# Patient Record
Sex: Male | Born: 1950
Health system: Southern US, Community
[De-identification: ages and names within clinical notes are randomized; demographics above are authoritative.]

## PROBLEM LIST (undated history)

## (undated) DIAGNOSIS — R51 Headache: Secondary | ICD-10-CM

## (undated) DIAGNOSIS — R0600 Dyspnea, unspecified: Secondary | ICD-10-CM

## (undated) DIAGNOSIS — I6529 Occlusion and stenosis of unspecified carotid artery: Secondary | ICD-10-CM

## (undated) DIAGNOSIS — I2510 Atherosclerotic heart disease of native coronary artery without angina pectoris: Secondary | ICD-10-CM

## (undated) DIAGNOSIS — R519 Headache, unspecified: Secondary | ICD-10-CM

## (undated) DIAGNOSIS — C44311 Basal cell carcinoma of skin of nose: Secondary | ICD-10-CM

## (undated) DIAGNOSIS — I10 Essential (primary) hypertension: Secondary | ICD-10-CM

## (undated) DIAGNOSIS — J942 Hemothorax: Secondary | ICD-10-CM

## (undated) DIAGNOSIS — I2699 Other pulmonary embolism without acute cor pulmonale: Secondary | ICD-10-CM

## (undated) DIAGNOSIS — F329 Major depressive disorder, single episode, unspecified: Secondary | ICD-10-CM

## (undated) DIAGNOSIS — Z87898 Personal history of other specified conditions: Secondary | ICD-10-CM

## (undated) DIAGNOSIS — Z720 Tobacco use: Secondary | ICD-10-CM

## (undated) DIAGNOSIS — I779 Disorder of arteries and arterioles, unspecified: Secondary | ICD-10-CM

## (undated) DIAGNOSIS — I639 Cerebral infarction, unspecified: Secondary | ICD-10-CM

## (undated) DIAGNOSIS — F32A Depression, unspecified: Secondary | ICD-10-CM

## (undated) DIAGNOSIS — Z9889 Other specified postprocedural states: Secondary | ICD-10-CM

## (undated) DIAGNOSIS — J449 Chronic obstructive pulmonary disease, unspecified: Secondary | ICD-10-CM

## (undated) DIAGNOSIS — Z9289 Personal history of other medical treatment: Secondary | ICD-10-CM

## (undated) DIAGNOSIS — I82409 Acute embolism and thrombosis of unspecified deep veins of unspecified lower extremity: Secondary | ICD-10-CM

## (undated) DIAGNOSIS — E785 Hyperlipidemia, unspecified: Secondary | ICD-10-CM

## (undated) DIAGNOSIS — I519 Heart disease, unspecified: Secondary | ICD-10-CM

## (undated) HISTORY — DX: Other pulmonary embolism without acute cor pulmonale: I26.99

## (undated) HISTORY — DX: Hyperlipidemia, unspecified: E78.5

## (undated) HISTORY — DX: Headache, unspecified: R51.9

## (undated) HISTORY — DX: Disorder of arteries and arterioles, unspecified: I77.9

## (undated) HISTORY — DX: Chronic obstructive pulmonary disease, unspecified: J44.9

## (undated) HISTORY — DX: Personal history of other medical treatment: Z92.89

## (undated) HISTORY — DX: Occlusion and stenosis of unspecified carotid artery: I65.29

## (undated) HISTORY — DX: Heart disease, unspecified: I51.9

## (undated) HISTORY — DX: Tobacco use: Z72.0

## (undated) HISTORY — DX: Hemothorax: J94.2

## (undated) HISTORY — DX: Personal history of other specified conditions: Z87.898

## (undated) HISTORY — DX: Headache: R51

## (undated) HISTORY — DX: Acute embolism and thrombosis of unspecified deep veins of unspecified lower extremity: I82.409

## (undated) HISTORY — PX: CIRCUMCISION: SUR203

## (undated) HISTORY — PX: MOHS SURGERY: SUR867

---

## 2004-09-25 ENCOUNTER — Ambulatory Visit (HOSPITAL_BASED_OUTPATIENT_CLINIC_OR_DEPARTMENT_OTHER): Admission: RE | Admit: 2004-09-25 | Discharge: 2004-09-25 | Payer: Self-pay | Admitting: Urology

## 2004-09-25 ENCOUNTER — Ambulatory Visit (HOSPITAL_COMMUNITY): Admission: RE | Admit: 2004-09-25 | Discharge: 2004-09-25 | Payer: Self-pay | Admitting: Urology

## 2011-11-14 ENCOUNTER — Emergency Department (INDEPENDENT_AMBULATORY_CARE_PROVIDER_SITE_OTHER)
Admission: EM | Admit: 2011-11-14 | Discharge: 2011-11-14 | Disposition: A | Discharge status: Home / Self Care | Payer: 59 | Source: Home / Self Care | Attending: Family Medicine | Admitting: Family Medicine

## 2011-11-14 ENCOUNTER — Encounter: Payer: Self-pay | Admitting: Emergency Medicine

## 2011-11-14 DIAGNOSIS — IMO0002 Reserved for concepts with insufficient information to code with codable children: Secondary | ICD-10-CM

## 2011-11-14 DIAGNOSIS — S76312A Strain of muscle, fascia and tendon of the posterior muscle group at thigh level, left thigh, initial encounter: Secondary | ICD-10-CM

## 2011-11-14 NOTE — ED Provider Notes (Signed)
History     CSN: [id number]  Arrival date & time 11/14/11  1401   First MD Initiated Contact with Patient 11/14/11 1417      Chief Complaint  Patient presents with  . Leg Pain     HPI Comments: Patient states that about 3 days ago he did not see a short step-off and came down abruptly on his left foot.  He subsequently developed localized pain/swelling in his left posterior thigh, and has pain with walking.   Patient is a 61 y.o. male presenting with leg pain. The history is provided by the patient.  Leg Pain  Incident onset: 3 days ago. The incident occurred in the street. Pain location: left posterior thigh. The quality of the pain is described as aching. The pain is moderate. The pain has been constant since onset. Associated symptoms include muscle weakness. Pertinent negatives include no numbness, no inability to bear weight, no loss of motion, no loss of sensation and no tingling. He reports no foreign bodies present. The symptoms are aggravated by bearing weight. He has tried nothing for the symptoms.    History reviewed. No pertinent past medical history.  Past Surgical History  Procedure Date  . Circumcision     Family History  Problem Relation Age of Onset  . Heart failure Father   . Hypertension Father   . Heart failure Brother   . Hypertension Brother   . Heart failure Paternal Uncle   . Hypertension Paternal Uncle     History  Substance Use Topics  . Smoking status: Current Everyday Smoker  . Smokeless tobacco: Not on file  . Alcohol Use: No      Review of Systems  Neurological: Negative for tingling and numbness.  All other systems reviewed and are negative.    Allergies  Review of patient's allergies indicates no known allergies.  Home Medications  No current outpatient prescriptions on file.  BP 148/84  Pulse 70  Temp 98.1 F (36.7 C) (Oral)  Resp 16  Ht 5\' 11"  (1.803 m)  Wt 195 lb (88.451 kg)  BMI 27.20 kg/m2  SpO2 97%  Physical Exam   Constitutional: He is oriented to person, place, and time. He appears well-developed and well-nourished. No distress.  Eyes: Conjunctivae and EOM are normal. Pupils are equal, round, and reactive to light.  Musculoskeletal: He exhibits tenderness. He exhibits no edema.       Left upper leg: He exhibits tenderness and swelling. He exhibits no bony tenderness, no edema, no deformity and no laceration.       Legs:      There is tenderness/swelling left posterior thigh as noted on diagram.  Pain is elicited when flexing left knee.  Distal Neurovascular function is intact.                                                                                          Neurological: He is alert and oriented to person, place, and time.  Skin: Skin is warm and dry. No rash noted.    ED Course  Procedures  none      1. Left hamstring muscle strain  Note elevated blood pressure today.      MDM   Ace wrap applied. Monitor Blood Pressure.  Followup with PCP if remains elevated Wear ace wrap on left upper leg until swelling decreases.  Apply ice pack several times daily.  Take Ibuprofen 200mg , 4 tabs every 8 hours with food.  Begin range of motion and stretching exercises in 3 to 5 days [employer] information and instruction handout given)  Followup with [facility] if not improving about two weeks.          Lattie Haw, MD 11/18/11 1800

## 2011-11-14 NOTE — ED Notes (Signed)
Reports pain in back of left leg/upper thigh x 2 days. Did have podiatry procedure on bunion on left foot earlier in week and has walked guardedly since then.

## 2012-08-31 ENCOUNTER — Emergency Department (INDEPENDENT_AMBULATORY_CARE_PROVIDER_SITE_OTHER)
Admission: EM | Admit: 2012-08-31 | Discharge: 2012-08-31 | Disposition: A | Discharge status: Home / Self Care | Payer: 59 | Source: Home / Self Care | Attending: Family Medicine | Admitting: Family Medicine

## 2012-08-31 ENCOUNTER — Encounter: Payer: Self-pay | Admitting: *Deleted

## 2012-08-31 ENCOUNTER — Emergency Department (INDEPENDENT_AMBULATORY_CARE_PROVIDER_SITE_OTHER): Payer: 59

## 2012-08-31 DIAGNOSIS — R1031 Right lower quadrant pain: Secondary | ICD-10-CM

## 2012-08-31 DIAGNOSIS — J189 Pneumonia, unspecified organism: Secondary | ICD-10-CM

## 2012-08-31 DIAGNOSIS — Z716 Tobacco abuse counseling: Secondary | ICD-10-CM

## 2012-08-31 DIAGNOSIS — K449 Diaphragmatic hernia without obstruction or gangrene: Secondary | ICD-10-CM

## 2012-08-31 DIAGNOSIS — K5730 Diverticulosis of large intestine without perforation or abscess without bleeding: Secondary | ICD-10-CM

## 2012-08-31 DIAGNOSIS — M47817 Spondylosis without myelopathy or radiculopathy, lumbosacral region: Secondary | ICD-10-CM

## 2012-08-31 DIAGNOSIS — R062 Wheezing: Secondary | ICD-10-CM

## 2012-08-31 LAB — POCT CBC W AUTO DIFF (K'VILLE URGENT CARE)

## 2012-08-31 MED ORDER — CEFTRIAXONE SODIUM 1 G IJ SOLR: 1.0000 g | INTRAMUSCULAR | Status: DC

## 2012-08-31 MED ORDER — AZITHROMYCIN 250 MG PO TABS: ORAL_TABLET | ORAL | Status: DC

## 2012-08-31 MED ORDER — METHYLPREDNISOLONE ACETATE 80 MG/ML IJ SUSP
80.0000 mg | Freq: Once | INTRAMUSCULAR | Status: AC
  Administered 2012-08-31: 80 mg via INTRAMUSCULAR

## 2012-08-31 MED ORDER — DOXYCYCLINE HYCLATE 100 MG PO CAPS: 100.0000 mg | ORAL_CAPSULE | Freq: Two times a day (BID) | ORAL | Status: DC

## 2012-08-31 MED ORDER — IOHEXOL 300 MG/ML  SOLN
100.0000 mL | Freq: Once | INTRAMUSCULAR | Status: AC | PRN
  Administered 2012-08-31: 100 mL via INTRAVENOUS

## 2012-08-31 NOTE — ED Notes (Signed)
Jermal c/o productive cough, chills/sweats, body aches and HA x 5 days. He received a flu vaccine in December 2013.

## 2012-08-31 NOTE — ED Provider Notes (Addendum)
History     CSN: [id number]  Arrival date & time 08/31/12  2952   First MD Initiated Contact with Patient 08/31/12 [phone]      Chief Complaint  Patient presents with  . Generalized Body Aches  . Fever  . Cough    HPI Patient presents today with chief complaint of generalized malaise, fatigue, low-grade fever, cough. Patient states symptoms have been present for the past 4-5 days. Patient denies any known sick contacts however has been around call students with strep. Patient denies any sore throat today. Positive productive cough, no shortness of breath, faint wheezing. Patient is noted to be occurring a quarter pack per day smoker and was a prior greater than one pack per day smoker for greater than 20 years. No nausea vomiting. Patient has had some mild abdominal pain. Mild body aches. No recent hospitalizations. Had flu shot this year.   History reviewed. No pertinent past medical history.  Past Surgical History  Procedure Laterality Date  . Circumcision      Family History  Problem Relation Age of Onset  . Heart failure Father   . Hypertension Father   . Heart failure Brother   . Hypertension Brother   . Heart failure Paternal Uncle   . Hypertension Paternal Uncle     History  Substance Use Topics  . Smoking status: Current Every Day Smoker -- 0.25 packs/day    Types: Cigarettes  . Smokeless tobacco: Never Used  . Alcohol Use: No      Review of Systems  All other systems reviewed and are negative.    Allergies  Review of patient's allergies indicates no known allergies.  Home Medications   Current Outpatient Rx  Name  Route  Sig  Dispense  Refill  . doxycycline (VIBRAMYCIN) 100 MG capsule   Oral   Take 1 capsule (100 mg total) by mouth 2 (two) times daily.   14 capsule   0     BP 169/91  Pulse 78  Temp(Src) 98.8 F (37.1 C) (Oral)  Resp 18  Ht 5\' 11"  (1.803 m)  Wt 193 lb (87.544 kg)  BMI 26.93 kg/m2  SpO2 94%  Physical Exam   Constitutional: He appears well-developed and well-nourished.  HENT:  Head: Normocephalic.  Eyes: Conjunctivae are normal. Pupils are equal, round, and reactive to light.  Neck: Normal range of motion.  Cardiovascular: Normal rate and regular rhythm.   Pulmonary/Chest: Effort normal. He has wheezes. He has rales.  + rales in LUL  Faint wheezes diffusely    Abdominal: Soft. Bowel sounds are normal.  + reproducible RLQ TTP  Mild guarding    Musculoskeletal: Normal range of motion.  Neurological: He is alert.  Skin: Skin is warm.    ED Course  Procedures (including critical care time)  Labs Reviewed - No data to display Dg Chest 2 View  08/31/2012  *RADIOLOGY REPORT*  Clinical Data: Cough and fever  CHEST - 2 VIEW  Comparison: None.  Findings: Left lower lobe infiltrate consistent with pneumonia.  No pleural effusion.  The right lung is clear.  Negative for heart failure.  IMPRESSION: Left lower lobe pneumonia.   Original Report Authenticated By: Janeece Riggers, M.D.    Ct Abdomen Pelvis W Contrast  08/31/2012  *RADIOLOGY REPORT*  Clinical Data: Generalized body aches.  Fever and cough.  CT ABDOMEN AND PELVIS WITH CONTRAST  Technique:  Multidetector CT imaging of the abdomen and pelvis was performed following the standard protocol during bolus administration  of intravenous contrast.  Contrast: OMNIPAQUE IOHEXOL 300 MG/ML  SOLN  Comparison: None available.  Findings: Left lower lobe pneumonia is confirmed.  Minimal right lower lobe airspace disease is noted medially.  The heart size is normal.  No significant pleural or pericardial effusion is present.  A small hiatal hernia is present.  The liver and spleen are within normal limits.  The stomach, duodenum, and pancreas are unremarkable.  The common bile duct and gallbladder are normal. The adrenal glands are normal bilaterally.  The kidneys and ureters are within normal limits bilaterally.  Rectosigmoid colon demonstrates minimal  diverticular changes.  No focal inflammation is evident to suggest diverticulitis.  The appendix is visualized and normal.  Small bowel is unremarkable. No significant adenopathy or free fluid is present.  The bone windows demonstrate to a focal loss of disc height at L4-5 with endplate changes.  Facet hypertrophy contributes to foraminal narrowing bilaterally at L4-5 and L5-S1.  There is straightening of the normal lumbar lordosis.  A superior endplate Schmorl's node is evident at L3.  No focal lytic or blastic lesions are present.  IMPRESSION:  1.  Left lower lobe pneumonia. 2.  No acute or focal abnormality the abdomen. 3.  Small hiatal hernia. 4.  Minimal diverticulosis without evidence for diverticulitis. 5.  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   Original Report Authenticated By: Marin Roberts, M.D.      1. CAP (community acquired pneumonia)   2. RLQ abdominal pain   3. Wheezing   4. Tobacco abuse counseling       MDM  Noted LLL PNA.  Will treat with rocephin and azithro.  No leukocytosis on CBC. Rapid flu negative.  Depomedrol for wheezing with likely some COPD overlap, though not formally diagnosed.  Discussed smoking cessation.  Will also send for CT abd-pelvis with IV and oral contrast given reporducible RLQ tenderness to r/o appendicitis.  Discussed infectious, resp, and GI red flags at length.  Follow up with PCP in 3-5 days.     The patient and/or caregiver has been counseled thoroughly with regard to treatment plan and/or medications prescribed including dosage, schedule, interactions, rationale for use, and possible side effects and they verbalize understanding. Diagnoses and expected course of recovery discussed and will return if not improved as expected or if the condition worsens. Patient and/or caregiver verbalized understanding.              Doree Albee, MD 08/31/12 1117  Discussed CT scan results (please see above for full details).  No  appendicitis.  Will continue with treatment course for CAP.   Doree Albee, MD 08/31/12 [phone]

## 2012-09-03 ENCOUNTER — Telehealth: Payer: Self-pay | Admitting: Emergency Medicine

## 2014-07-09 DIAGNOSIS — I639 Cerebral infarction, unspecified: Secondary | ICD-10-CM

## 2014-07-09 HISTORY — DX: Cerebral infarction, unspecified: I63.9

## 2014-07-27 ENCOUNTER — Encounter (HOSPITAL_COMMUNITY): Payer: Self-pay | Admitting: Emergency Medicine

## 2014-07-27 ENCOUNTER — Inpatient Hospital Stay (HOSPITAL_COMMUNITY)
Admission: EM | Admit: 2014-07-27 | Discharge: 2014-07-31 | DRG: 065 | Disposition: A | Discharge status: Home / Self Care | Payer: 59 | Attending: Internal Medicine | Admitting: Internal Medicine

## 2014-07-27 ENCOUNTER — Inpatient Hospital Stay (HOSPITAL_COMMUNITY): Payer: 59

## 2014-07-27 ENCOUNTER — Emergency Department (HOSPITAL_COMMUNITY): Payer: 59

## 2014-07-27 DIAGNOSIS — I10 Essential (primary) hypertension: Secondary | ICD-10-CM | POA: Diagnosis present

## 2014-07-27 DIAGNOSIS — E785 Hyperlipidemia, unspecified: Secondary | ICD-10-CM | POA: Diagnosis present

## 2014-07-27 DIAGNOSIS — R931 Abnormal findings on diagnostic imaging of heart and coronary circulation: Secondary | ICD-10-CM | POA: Diagnosis not present

## 2014-07-27 DIAGNOSIS — Z7982 Long term (current) use of aspirin: Secondary | ICD-10-CM | POA: Diagnosis not present

## 2014-07-27 DIAGNOSIS — I429 Cardiomyopathy, unspecified: Secondary | ICD-10-CM | POA: Diagnosis present

## 2014-07-27 DIAGNOSIS — R200 Anesthesia of skin: Secondary | ICD-10-CM | POA: Diagnosis present

## 2014-07-27 DIAGNOSIS — I639 Cerebral infarction, unspecified: Secondary | ICD-10-CM

## 2014-07-27 DIAGNOSIS — I638 Other cerebral infarction: Secondary | ICD-10-CM

## 2014-07-27 DIAGNOSIS — I63031 Cerebral infarction due to thrombosis of right carotid artery: Secondary | ICD-10-CM | POA: Diagnosis not present

## 2014-07-27 DIAGNOSIS — F17210 Nicotine dependence, cigarettes, uncomplicated: Secondary | ICD-10-CM | POA: Diagnosis present

## 2014-07-27 DIAGNOSIS — I63519 Cerebral infarction due to unspecified occlusion or stenosis of unspecified middle cerebral artery: Principal | ICD-10-CM | POA: Diagnosis present

## 2014-07-27 DIAGNOSIS — I6340 Cerebral infarction due to embolism of unspecified cerebral artery: Secondary | ICD-10-CM | POA: Diagnosis present

## 2014-07-27 DIAGNOSIS — I63411 Cerebral infarction due to embolism of right middle cerebral artery: Secondary | ICD-10-CM | POA: Diagnosis not present

## 2014-07-27 DIAGNOSIS — I6789 Other cerebrovascular disease: Secondary | ICD-10-CM | POA: Diagnosis not present

## 2014-07-27 DIAGNOSIS — Z720 Tobacco use: Secondary | ICD-10-CM | POA: Diagnosis not present

## 2014-07-27 HISTORY — DX: Essential (primary) hypertension: I10

## 2014-07-27 LAB — DIFFERENTIAL
BASOS ABS: 0.1 10*3/uL (ref 0.0–0.1)
Basophils Relative: 1 % (ref 0–1)
Eosinophils Absolute: 0.1 10*3/uL (ref 0.0–0.7)
Eosinophils Relative: 1 % (ref 0–5)
LYMPHS PCT: 27 % (ref 12–46)
Lymphs Abs: 2.6 10*3/uL (ref 0.7–4.0)
MONOS PCT: 8 % (ref 3–12)
Monocytes Absolute: 0.7 10*3/uL (ref 0.1–1.0)
NEUTROS ABS: 6.2 10*3/uL (ref 1.7–7.7)
NEUTROS PCT: 63 % (ref 43–77)

## 2014-07-27 LAB — COMPREHENSIVE METABOLIC PANEL
ALK PHOS: 75 U/L (ref 39–117)
ALT: 32 U/L (ref 0–53)
AST: 28 U/L (ref 0–37)
Albumin: 4.3 g/dL (ref 3.5–5.2)
Anion gap: 10 (ref 5–15)
BILIRUBIN TOTAL: 0.7 mg/dL (ref 0.3–1.2)
BUN: 18 mg/dL (ref 6–23)
CO2: 22 mmol/L (ref 19–32)
Calcium: 9.1 mg/dL (ref 8.4–10.5)
Chloride: 105 mmol/L (ref 96–112)
Creatinine, Ser: 0.86 mg/dL (ref 0.50–1.35)
Glucose, Bld: 96 mg/dL (ref 70–99)
Potassium: 4.1 mmol/L (ref 3.5–5.1)
Sodium: 137 mmol/L (ref 135–145)
TOTAL PROTEIN: 7 g/dL (ref 6.0–8.3)

## 2014-07-27 LAB — RAPID URINE DRUG SCREEN, HOSP PERFORMED
AMPHETAMINES: NOT DETECTED
BARBITURATES: NOT DETECTED
Benzodiazepines: NOT DETECTED
COCAINE: NOT DETECTED
Opiates: NOT DETECTED
TETRAHYDROCANNABINOL: NOT DETECTED

## 2014-07-27 LAB — CBC
HCT: 46.8 % (ref 39.0–52.0)
Hemoglobin: 16.2 g/dL (ref 13.0–17.0)
MCH: 31.2 pg (ref 26.0–34.0)
MCHC: 34.6 g/dL (ref 30.0–36.0)
MCV: 90 fL (ref 78.0–100.0)
PLATELETS: 224 10*3/uL (ref 150–400)
RBC: 5.2 MIL/uL (ref 4.22–5.81)
RDW: 13 % (ref 11.5–15.5)
WBC: 9.7 10*3/uL (ref 4.0–10.5)

## 2014-07-27 LAB — URINALYSIS, ROUTINE W REFLEX MICROSCOPIC
BILIRUBIN URINE: NEGATIVE
Glucose, UA: NEGATIVE mg/dL
HGB URINE DIPSTICK: NEGATIVE
KETONES UR: NEGATIVE mg/dL
NITRITE: NEGATIVE
Protein, ur: NEGATIVE mg/dL
Specific Gravity, Urine: 1.029 (ref 1.005–1.030)
UROBILINOGEN UA: 1 mg/dL (ref 0.0–1.0)
pH: 6 (ref 5.0–8.0)

## 2014-07-27 LAB — APTT: APTT: 33 s (ref 24–37)

## 2014-07-27 LAB — URINE MICROSCOPIC-ADD ON

## 2014-07-27 LAB — I-STAT CHEM 8, ED
BUN: 17 mg/dL (ref 6–23)
CALCIUM ION: 1.2 mmol/L (ref 1.13–1.30)
CHLORIDE: 102 mmol/L (ref 96–112)
Creatinine, Ser: 0.9 mg/dL (ref 0.50–1.35)
Glucose, Bld: 98 mg/dL (ref 70–99)
HCT: 50 % (ref 39.0–52.0)
HEMOGLOBIN: 17 g/dL (ref 13.0–17.0)
Potassium: 4 mmol/L (ref 3.5–5.1)
Sodium: 138 mmol/L (ref 135–145)
TCO2: 20 mmol/L (ref 0–100)

## 2014-07-27 LAB — ETHANOL: Alcohol, Ethyl (B): <5 mg/dL (ref 0–9)

## 2014-07-27 LAB — I-STAT TROPONIN, ED: TROPONIN I, POC: 0.02 ng/mL (ref 0.00–0.08)

## 2014-07-27 LAB — PROTIME-INR
INR: 0.97 (ref 0.00–1.49)
Prothrombin Time: 13 seconds (ref 11.6–15.2)

## 2014-07-27 MED ORDER — ACETAMINOPHEN 325 MG PO TABS
650.0000 mg | ORAL_TABLET | Freq: Four times a day (QID) | ORAL | Status: DC | PRN
  Administered 2014-07-27 – 2014-07-31 (×7): 650 mg via ORAL
  Filled 2014-07-27 (×7): qty 2

## 2014-07-27 MED ORDER — HYDRALAZINE HCL 20 MG/ML IJ SOLN
5.0000 mg | Freq: Four times a day (QID) | INTRAMUSCULAR | Status: DC | PRN
  Administered 2014-07-27 – 2014-07-30 (×4): 5 mg via INTRAVENOUS
  Filled 2014-07-27 (×4): qty 1

## 2014-07-27 MED ORDER — SENNOSIDES-DOCUSATE SODIUM 8.6-50 MG PO TABS
1.0000 | ORAL_TABLET | Freq: Every evening | ORAL | Status: DC | PRN
  Administered 2014-07-31: 1 via ORAL
  Filled 2014-07-27 (×2): qty 1

## 2014-07-27 MED ORDER — ACETAMINOPHEN 325 MG PO TABS
650.0000 mg | ORAL_TABLET | Freq: Once | ORAL | Status: AC
  Administered 2014-07-27: 650 mg via ORAL
  Filled 2014-07-27: qty 2

## 2014-07-27 MED ORDER — STROKE: EARLY STAGES OF RECOVERY BOOK
Freq: Once | Status: AC
  Administered 2014-07-27

## 2014-07-27 NOTE — ED Provider Notes (Signed)
CSN: [id number]     Arrival date & time 07/27/14  1446 History   First MD Initiated Contact with Patient 07/27/14 1459     Chief Complaint  Patient presents with  . Numbness    left arm and leg     (Consider location/radiation/quality/duration/timing/severity/associated sxs/prior Treatment) The history is provided by the patient.  Daniel [name] is a 64 y.o. male here with numbness, ataxia. Patient was washing dishes around 1pm yesterday and developed L arm numbness. Since then, he has been dropping things out of left hand. Also associated with some episodes of forgetfulness and confusion. He also has been less steady and has been bumping into walls. No medical problems but he has not seen doctors for years. He is a smoker and some intermittent cough for many months.    Past Medical History  Diagnosis Date  . Essential hypertension    Past Surgical History  Procedure Laterality Date  . Circumcision     Family History  Problem Relation Age of Onset  . Heart failure Father   . Hypertension Father   . Heart failure Brother   . Hypertension Brother   . Heart failure Paternal Uncle   . Hypertension Paternal Uncle    History  Substance Use Topics  . Smoking status: Current Every Day Smoker -- 0.25 packs/day    Types: Cigarettes  . Smokeless tobacco: Never Used  . Alcohol Use: No    Review of Systems  Neurological: Positive for weakness and numbness.  All other systems reviewed and are negative.      Allergies  Review of patient's allergies indicates no known allergies.  Home Medications   Prior to Admission medications   Medication Sig Start Date End Date Taking? Authorizing Provider  Aspirin-Acetaminophen-Caffeine [phone] MG PACK Take 1 Package by mouth every 6 (six) hours as needed (pain).   Yes Historical Provider, MD  ibuprofen (ADVIL,MOTRIN) 200 MG tablet Take 800 mg by mouth every 6 (six) hours as needed for moderate pain.   Yes Historical Provider, MD   Multiple Vitamin (MULTIVITAMIN WITH MINERALS) TABS tablet Take 1 tablet by mouth daily.   Yes Historical Provider, MD  naproxen sodium (ANAPROX) 220 MG tablet Take 440 mg by mouth 2 (two) times daily as needed (pain).   Yes Historical Provider, MD  azithromycin (ZITHROMAX) 250 MG tablet Take 2 tabs PO x 1 dose, then 1 tab PO QD x 4 days Patient not taking: Reported on 07/27/2014 08/31/12   Deneise Lever, MD   BP 181/92 mmHg  Pulse 67  Temp(Src) 98.2 F (36.8 C) (Oral)  Resp 21  SpO2 98% Physical Exam  Constitutional: He is oriented to person, place, and time. He appears well-developed and well-nourished.  HENT:  Head: Normocephalic.  Mouth/Throat: Oropharynx is clear and moist.  Eyes: Conjunctivae are normal. Pupils are equal, round, and reactive to light.  Neck: Normal range of motion. Neck supple.  Cardiovascular: Normal rate, regular rhythm and normal heart sounds.   Pulmonary/Chest: Effort normal and breath sounds normal. No respiratory distress. He has no wheezes. He has no rales.  Abdominal: Soft. Bowel sounds are normal. He exhibits no distension. There is no tenderness. There is no rebound and no guarding.  Musculoskeletal: Normal range of motion. He exhibits no edema or tenderness.  Neurological: He is alert and oriented to person, place, and time.  Dec sensation L arm and leg. Slightly dec hand grasp L hand. ? Dysmetria L arm. No pronator drift. Falls to the  left with ambulation.   Skin: Skin is warm and dry.  Psychiatric: He has a normal mood and affect. His behavior is normal. Thought content normal.  Nursing note and vitals reviewed.   ED Course  Procedures (including critical care time) Labs Review Labs Reviewed  ETHANOL  PROTIME-INR  APTT  CBC  DIFFERENTIAL  COMPREHENSIVE METABOLIC PANEL  URINE RAPID DRUG SCREEN (HOSP PERFORMED)  URINALYSIS, ROUTINE W REFLEX MICROSCOPIC  I-STAT CHEM 8, ED  I-STAT TROPOININ, ED  Randolm Idol, ED    Imaging Review Dg  Chest 2 View  07/27/2014   CLINICAL DATA:  Left CVA.  EXAM: CHEST  2 VIEW  COMPARISON:  08/31/2012  FINDINGS: The cardiomediastinal silhouette is unremarkable.  There is no evidence of focal airspace disease, pulmonary edema, suspicious pulmonary nodule/mass, pleural effusion, or pneumothorax. No acute bony abnormalities are identified.  IMPRESSION: No active cardiopulmonary disease.   Electronically Signed   By: Margarette Canada M.D.   On: 07/27/2014 16:43   Ct Head Wo Contrast  07/27/2014   CLINICAL DATA:  64 year old male with acute confusion, left hand and left leg weakness for 1 day. Initial encounter.  EXAM: CT HEAD WITHOUT CONTRAST  TECHNIQUE: Contiguous axial images were obtained from the base of the skull through the vertex without intravenous contrast.  COMPARISON:  None.  FINDINGS: Hypodensity within the right frontoparietal region involves the cortex and compatible with an acute-subacute infarct. A calcification/clot within the right MCA branch supplying this region is noted. There is no evidence of hemorrhage.  No other intracranial abnormalities are identified, including mass lesion, hydrocephalus, extra-axial fluid collection, or midline shift.  Chronic right maxillary sinus disease noted.  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 No evidence of hemorrhage.  Chronic right maxillary sinusitis.  Critical Value/emergent results were called by telephone at the time of interpretation on 07/27/2014 at 4:31 pm to Dr. Shirlyn Goltz , who verbally acknowledged these results.   Electronically Signed   By: Margarette Canada M.D.   On: 07/27/2014 16:31     EKG Interpretation   Date/Time:  Saturday July 27 2014 14:57:30 EDT Ventricular Rate:  62 PR Interval:  157 QRS Duration: 106 QT Interval:  405 QTC Calculation: 411 R Axis:   -25 Text Interpretation:  Sinus rhythm RSR' in V1 or V2, right VCD or RVH LVH  with  secondary repolarization abnormality No previous ECGs available  Confirmed by YAO  MD, DAVID ([id number]) on 07/27/2014 3:42:38 PM      MDM   Final diagnoses:  None    Daniel [name] is a 64 y.o. male here with L sided numbness, weakness. Concerned for stroke. Will get CT head, MRI, labs, swallow eval. Likely will need admission for stroke.   5:11 PM CT showed acute to subacute R frontoparietal infarct. Outside window for TPA. Called Dr. Janann Colonel, neurology, who recommend transfer to [facility]. Hospitalist to admit.    Wandra Arthurs, MD 07/27/14 [phone]

## 2014-07-27 NOTE — ED Notes (Signed)
Pt transported to MRI. Pt will come back to TCU. Will call report to Izora Gala, RN.

## 2014-07-27 NOTE — ED Notes (Signed)
MRI tech on call on the way in. Pt will be scanned after his arrival.

## 2014-07-27 NOTE — ED Notes (Addendum)
Pt reports yesterday he was washing dishes and all of the sudden, "I started shaking and I couldn't grip anything with my left hand. Now it's going down into my left leg. Now I'm afraid to drive. I just got confused and I got confused this morning, I started dropping everything and I kept bumping into the walls (around 1330 yesterday). Denies hx strokes (not on blood thinners). Patient is speaking full/clear sentences. No eyelid droop noted. Patient has full ROM with right and left arms. Was ambulatory with steady gait. No limp noted. A&Ox4. Able to take shirt off and visualized moving left arm. When asked to grip, unable to wrap left hand around this writer. Able to grip with moderate strength on the right. No drift with arms noted when asked to hold parallel above head. However, when sitting arms by side-left arm falls down into the side of the bed. Denies hx anxiety. Able to touch top and bottom lip with tongue.Also c/o severe headache with mild blurry vision starting yesterday around 1330. No other c/c.

## 2014-07-27 NOTE — ED Notes (Signed)
MRI tech to come pick up pt for MRI

## 2014-07-27 NOTE — H&P (Addendum)
[facility] History and Physical  Daniel [name] RFF:[id number] DOB: 06/18/1950 DOA: 07/27/2014  Referring physician: ER physician PCP: No primary care provider on file.   Chief Complaint: feeling off balance, left arm and leg numbness   HPI:  64 year old male with no significant past medical history presented to [facility] ED with reports of sudden onset numbness in left had that has eventually progressed to left leg. He felt weakness in his hand grip and things were falling out of his hand and in addition to that he felt he has tendency to fall onto the left side. He also had some dizziness and just felt out of balance. No chest pain, shortness of breath, palpitations. No abdominal pain, nausea or vomiting. No loss of consciousness. No headache, no blurry vision. No fevers or chills.  In ED, pt was hemodynamically stable. CT head showed acute to subacute right frontotemporal infarct with calcification within the right MCA branch. Neurology consulted. Pt outside of tPA window. Transfer to [facility] and neurology to see the pt in consultation.    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 No evidence of hemorrhage.  Chronic right maxillary sinusitis.  Critical Value/emergent results were called by telephone at the time of interpretation on 07/27/2014 at 4:31 pm to Dr. Shirlyn Goltz , who verbally acknowledged these results.      Code Status:  Full Family Communication: Plan of care discussed with the patient  Disposition Plan: Admit for further evaluation; transfer to [place], MD  [facility] Pager [phone]  Review of Systems:  Constitutional: Negative for fever, chills and malaise/fatigue. Negative for diaphoresis.  HENT: Negative for hearing loss, ear pain, nosebleeds, congestion, sore throat, neck pain, tinnitus and ear discharge.   Eyes: Negative for blurred vision, double vision, photophobia, pain, discharge and redness.  Respiratory: Negative for cough, hemoptysis, sputum production, shortness of breath, wheezing and stridor.   Cardiovascular: Negative for chest pain, palpitations, orthopnea, claudication and leg swelling.  Gastrointestinal: Negative for nausea, vomiting and abdominal pain. Negative for heartburn, constipation, blood in stool and melena.  Genitourinary: Negative for dysuria, urgency, frequency, hematuria and flank pain.  Musculoskeletal: Negative for myalgias, back pain, joint pain and falls.  Skin: Negative for itching and rash.  Neurological: per HPI Endo/Heme/Allergies: Negative for environmental allergies and polydipsia. Does not bruise/bleed easily.  Psychiatric/Behavioral: Negative for suicidal ideas. The patient is not nervous/anxious.      History reviewed. No pertinent past medical history. Past Surgical History  Procedure Laterality Date  . Circumcision     Social History:  reports that he has been smoking Cigarettes.  He has been smoking about 0.25 packs per day. He has never used smokeless tobacco. He reports that he does not drink alcohol or use illicit drugs.  No Known Allergies  Family History:  Family History  Problem Relation Age of Onset  . Heart failure Father   . Hypertension Father   .  Heart failure Brother   . Hypertension Brother   . Heart failure Paternal Uncle   . Hypertension Paternal Uncle      Prior to Admission medications    Medication Sig Start Date End Date Taking? Authorizing Provider  Aspirin-Acetaminophen-Caffeine [phone] MG PACK Take 1 Package by mouth every 6 (six) hours as needed (pain).   Yes Historical Provider, MD  ibuprofen (ADVIL,MOTRIN) 200 MG tablet Take 800 mg by mouth every 6 (six) hours as needed for moderate pain.   Yes Historical Provider, MD  Multiple Vitamin (MULTIVITAMIN WITH MINERALS) TABS tablet Take 1 tablet by mouth daily.   Yes Historical Provider, MD  naproxen sodium (ANAPROX) 220 MG tablet Take 440 mg by mouth 2 (two) times daily as needed (pain).   Yes Historical Provider, MD  azithromycin (ZITHROMAX) 250 MG tablet Take 2 tabs PO x 1 dose, then 1 tab PO QD x 4 days Patient not taking: Reported on 07/27/2014 08/31/12   Deneise Lever, MD   Physical Exam: Filed Vitals:   07/27/14 1455 07/27/14 1458 07/27/14 1615  BP: 145/126 174/113   Pulse: 70    Temp: 98.2 F (36.8 C)  98.2 F (36.8 C)  TempSrc: Oral    Resp: 17    SpO2: 99%      Physical Exam  Constitutional: Appears well-developed and well-nourished. No distress.  HENT: Normocephalic. No tonsillar erythema or exudates Eyes: Conjunctivae and EOM are normal. PERRLA, no scleral icterus.  Neck: Normal ROM. Neck supple. No JVD. No tracheal deviation. No thyromegaly.  CVS: RRR, S1/S2 +, no murmurs, no gallops, no carotid bruit.  Pulmonary: Effort and breath sounds normal, no stridor, rhonchi, wheezes, rales.  Abdominal: Soft. BS +,  no distension, tenderness, rebound or guarding.  Musculoskeletal: Normal range of motion. No edema and no tenderness.  Lymphadenopathy: No lymphadenopathy noted, cervical, inguinal. Neuro: decreased left hand grip, decreased sensation on left. Skin: Skin is warm and dry. No rash noted.  No erythema. No pallor.  Psychiatric: Normal mood and affect. Behavior, judgment, thought content normal.   Labs on Admission:  Basic Metabolic Panel:  Recent Labs Lab 07/27/14 1539 07/27/14 1545  NA 137  138  K 4.1 4.0  CL 105 102  CO2 22  --   GLUCOSE 96 98  BUN 18 17  CREATININE 0.86 0.90  CALCIUM 9.1  --    Liver Function Tests:  Recent Labs Lab 07/27/14 1539  AST 28  ALT 32  ALKPHOS 75  BILITOT 0.7  PROT 7.0  ALBUMIN 4.3   No results for input(s): LIPASE, AMYLASE in the last 168 hours. No results for input(s): AMMONIA in the last 168 hours. CBC:  Recent Labs Lab 07/27/14 1539 07/27/14 1545  WBC 9.7  --   NEUTROABS 6.2  --   HGB 16.2 17.0  HCT 46.8 50.0  MCV 90.0  --   PLT 224  --    Cardiac Enzymes: No results for input(s): CKTOTAL, CKMB, CKMBINDEX, TROPONINI in the last 168 hours. BNP: Invalid input(s): POCBNP CBG: No results for input(s): GLUCAP in the last 168 hours.  If 7PM-7AM, please contact night-coverage [email or web address] Password [facility] 07/27/2014, 4:34 PM

## 2014-07-28 ENCOUNTER — Encounter (HOSPITAL_COMMUNITY): Payer: Self-pay | Admitting: Radiology

## 2014-07-28 ENCOUNTER — Inpatient Hospital Stay (HOSPITAL_COMMUNITY): Payer: 59

## 2014-07-28 DIAGNOSIS — Z720 Tobacco use: Secondary | ICD-10-CM

## 2014-07-28 DIAGNOSIS — E785 Hyperlipidemia, unspecified: Secondary | ICD-10-CM

## 2014-07-28 LAB — GLUCOSE, CAPILLARY: GLUCOSE-CAPILLARY: 101 mg/dL — AB (ref 70–99)

## 2014-07-28 LAB — LIPID PANEL
CHOLESTEROL: 185 mg/dL (ref 0–200)
HDL: 32 mg/dL — AB (ref >39)
LDL Cholesterol: 129 mg/dL — ABNORMAL HIGH (ref 0–99)
Total CHOL/HDL Ratio: 5.8 RATIO
Triglycerides: 119 mg/dL (ref <150)
VLDL: 24 mg/dL (ref 0–40)

## 2014-07-28 MED ORDER — ASPIRIN EC 325 MG PO TBEC
325.0000 mg | DELAYED_RELEASE_TABLET | Freq: Every day | ORAL | Status: DC
  Administered 2014-07-28 – 2014-07-31 (×4): 325 mg via ORAL
  Filled 2014-07-28 (×4): qty 1

## 2014-07-28 MED ORDER — IOHEXOL 350 MG/ML SOLN
80.0000 mL | Freq: Once | INTRAVENOUS | Status: AC | PRN
  Administered 2014-07-28: 80 mL via INTRAVENOUS

## 2014-07-28 NOTE — Evaluation (Signed)
Physical Therapy Evaluation [name] Details Name: Daniel [name] MRN: [id number] DOB: December 16, 1950 Today's Date: 07/28/2014   History of Present Illness  64 year old male with no significant past medical history presented with sudden onset of numbness in Daniel left side including hand as well as leg. [name] had dizziness and felt he could not maintain his balance. 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   Clinical Impression  Pt admitted with above diagnosis. Pt currently with functional limitations due to Daniel deficits listed below (see PT Problem List). Pt demo mild left side neglect. He is able to attend to left side with min verbal cues when sitting in a resting position. However, when performing more involved motor skills (dressing, hand washing, and ambulation), mod to max verbal cues required for pt to attend to left. Perseveration also present and appeared to follow same pattern, increasing in severity with higher involved motor skills. Decreased proprioception noted LUE > LLE. Pt has poor awareness of deficits and presents as increased risk for falls. He was also noted to be impulsive. Pt will benefit from skilled PT to increase his independence and safety with mobility to allow discharge to Daniel venue listed below.       Follow Up Recommendations CIR;Supervision/Assistance - 24 hour    Equipment Recommendations  None recommended by PT    Recommendations for Other Services Rehab consult     Precautions / Restrictions Precautions Precautions: Fall      Mobility  Bed Mobility Overal bed mobility: Modified Independent                Transfers Overall transfer level: Needs assistance Equipment used: None Transfers: Sit to/from [employer] Transfers Sit to Stand: Min guard Stand pivot transfers: Min guard       General transfer comment: min guard for safety/verbal  cues  Ambulation/Gait Ambulation/Gait assistance: Min guard Ambulation Distance (Feet): 150 Feet Assistive device: None Gait Pattern/deviations: Narrow base of support;Decreased stride length;Step-through pattern Gait velocity: mildly decreased      Stairs            Wheelchair Mobility    Modified Rankin (Stroke Patients Only) Modified Rankin (Stroke Patients Only) Pre-Morbid Rankin Score: No symptoms Modified Rankin: Slight disability     Balance Overall balance assessment: Needs assistance Sitting-balance support: No upper extremity supported;Feet unsupported Sitting balance-Leahy Scale: Normal     Standing balance support: No upper extremity supported;During functional activity Standing balance-Leahy Scale: Fair (on Daniel high side of fair)                               Pertinent Vitals/Pain Pain Assessment: No/denies pain    Home Living Family/[name] expects to be discharged to:: Private residence Living Arrangements: Spouse/significant other Available Help at Discharge: Family;Available 24 hours/day Type of Home: House Home Access: Level entry     Home Layout: One level Home Equipment: None      Prior Function Level of Independence: Independent         Comments: 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     Hand Dominance        Extremity/Trunk Assessment   Upper Extremity Assessment: Defer to OT evaluation (Pt reports numbness LUE distally, including his hand.)           Lower Extremity Assessment: RLE deficits/detail;LLE deficits/detail      Cervical /  Trunk Assessment: Normal  Communication   Communication: No difficulties  Cognition Arousal/Alertness: Awake/alert Behavior During Therapy: Impulsive Overall Cognitive Status: Impaired/Different from baseline Area of Impairment: Safety/judgement;Problem solving     Memory: Decreased recall of precautions   Safety/Judgement: Decreased awareness of safety;Decreased  awareness of deficits   Problem Solving: Difficulty sequencing;Requires verbal cues      General Comments      Exercises        Assessment/Plan    PT Assessment [name] needs continued PT services  PT Diagnosis Abnormality of gait   PT Problem List Decreased balance;Decreased mobility;Decreased coordination;Decreased safety awareness;Decreased cognition  PT Treatment Interventions Gait training;Stair training;Functional mobility training;Therapeutic activities;Therapeutic exercise;[name]/family education;Cognitive remediation;Balance training;Neuromuscular re-education   PT Goals (Current goals can be found in Daniel Care Plan section) Acute Rehab PT Goals [name] Stated Goal: return to work PT Goal Formulation: With [name] Time For Goal Achievement: 08/04/14 Potential to Achieve Goals: Good    Frequency Min 5X/week   Barriers to discharge        Co-evaluation               End of Session Equipment Utilized During Treatment: Gait belt Activity Tolerance: [name] tolerated treatment well [name] left: in chair;with chair alarm set;with call bell/phone within reach;with nursing/sitter in room Nurse Communication: Mobility status         Time: 1451-1532 PT Time Calculation (min) (ACUTE ONLY): 41 min   Charges:   PT Evaluation $Initial PT Evaluation Tier Daniel: 1 Procedure PT Treatments $Gait Training: 8-22 mins $Therapeutic Activity: 8-22 mins   PT G Codes:        Lorriane Shire 07/28/2014, 3:53 PM

## 2014-07-28 NOTE — Consult Note (Addendum)
Referring Physician: Darl Householder    Chief Complaint: Left sided numbness  HPI: Daniel [name] is a RH 64 y.o. male who reports that on Friday he was working in the yard and was at baseline.  When he went into the house he noted while in the kitchen acute onset left hand numbness.  This progressed to involve the arm and then the LLE.  He became off balance and was walking into walls.  He felt as if he was drunk.  Noted some twitching of the left eye as well for a time.  He did not seek medical attention at that time.  Saturday felt as if he was getting some RLE numbness as well and presented for evaluation.    Date last known well: Date: 07/27/2014 Time last known well: Time: 13:00 tPA Given: No: Outside time window  Past Medical History  Diagnosis Date  . Essential hypertension     Past Surgical History  Procedure Laterality Date  . Circumcision      Family History  Problem Relation Age of Onset  . Heart failure Father   . Hypertension Father   . Heart failure Brother   . Hypertension Brother   . Heart failure Paternal Uncle   . Hypertension Paternal Uncle     Mother with Alzheimers  Social History:  reports that he has been smoking Cigarettes.  He has been smoking about 0.25 packs per day. He has never used smokeless tobacco. He reports that he does not drink alcohol or use illicit drugs.  Allergies: No Known Allergies  Medications:  I have reviewed the patient's current medications. Prior to Admission:  Prescriptions prior to admission  Medication Sig Dispense Refill Last Dose  . Aspirin-Acetaminophen-Caffeine 500-325-65 MG PACK Take 1 Package by mouth every 6 (six) hours as needed (pain).   07/27/2014 at Unknown time  . ibuprofen (ADVIL,MOTRIN) 200 MG tablet Take 800 mg by mouth every 6 (six) hours as needed for moderate pain.   Past Week at Unknown time  . Multiple Vitamin (MULTIVITAMIN WITH MINERALS) TABS tablet Take 1 tablet by mouth daily.   07/27/2014 at Unknown time  .  naproxen sodium (ANAPROX) 220 MG tablet Take 440 mg by mouth 2 (two) times daily as needed (pain).   Past Month at Unknown time  . azithromycin (ZITHROMAX) 250 MG tablet Take 2 tabs PO x 1 dose, then 1 tab PO QD x 4 days (Patient not taking: Reported on 07/27/2014) 6 tablet 0 Completed Course at Unknown time   Scheduled:   ROS: History obtained from the patient  General ROS: negative for - chills, fatigue, fever, night sweats, weight gain or weight loss Psychological ROS: negative for - behavioral disorder, hallucinations, memory difficulties, mood swings or suicidal ideation Ophthalmic ROS: negative for - blurry vision, double vision, eye pain or loss of vision ENT ROS: negative for - epistaxis, nasal discharge, oral lesions, sore throat, tinnitus or vertigo Allergy and Immunology ROS: negative for - hives or itchy/watery eyes Hematological and Lymphatic ROS: negative for - bleeding problems, bruising or swollen lymph nodes Endocrine ROS: negative for - galactorrhea, hair pattern changes, polydipsia/polyuria or temperature intolerance Respiratory ROS: negative for - cough, hemoptysis, shortness of breath or wheezing Cardiovascular ROS: negative for - chest pain, dyspnea on exertion, edema or irregular heartbeat Gastrointestinal ROS: negative for - abdominal pain, diarrhea, hematemesis, nausea/vomiting or stool incontinence Genito-Urinary ROS: negative for - dysuria, hematuria, incontinence or urinary frequency/urgency Musculoskeletal ROS: negative for - joint swelling or muscular weakness  Neurological ROS: as noted in HPI Dermatological ROS: negative for rash and skin lesion changes  Physical Examination: Blood pressure 147/64, pulse 62, temperature 98.1 F (36.7 C), temperature source Axillary, resp. rate 16, height 5\' 11"  (1.803 m), SpO2 97 %.  HEENT-  Normocephalic, no lesions, without obvious abnormality.  Normal external eye and conjunctiva.  Normal TM's bilaterally.  Normal auditory  canals and external ears. Normal external nose, mucus membranes and septum.  Normal pharynx. Cardiovascular- S1, S2 normal, pulses palpable throughout   Lungs- chest clear, no wheezing, rales, normal symmetric air entry Abdomen- soft, non-tender; bowel sounds normal; no masses,  no organomegaly Extremities- no edema Lymph-no adenopathy palpable Musculoskeletal-no joint tenderness, deformity or swelling Skin-warm and dry, no hyperpigmentation, vitiligo, or suspicious lesions  Neurological Examination Mental Status: Alert, oriented, thought content appropriate.  Speech fluent without evidence of aphasia.  Able to follow 3 step commands without difficulty. Cranial Nerves: II: Discs flat bilaterally; Visual fields grossly normal, pupils equal, round, reactive to light and accommodation III,IV, VI: ptosis not present, extra-ocular motions intact bilaterally V,VII: smile symmetric, facial light touch sensation decreased on the left VIII: hearing normal bilaterally IX,X: gag reflex present XI: bilateral shoulder shrug XII: midline tongue extension Motor: Right : Upper extremity   5/5    Left:     Upper extremity   5/5  Lower extremity   5/5     Lower extremity   5/5 Tone and bulk:normal tone throughout; no atrophy noted Sensory: Pinprick and light touch decreased on the left Deep Tendon Reflexes: 1+ and symmetric throughout with absent AJ's bilaterally Plantars: Right: downgoing   Left: upgoing Cerebellar: normal finger-to-nose and normal heel-to-shin testing bilaterally Gait: narrow based but ataxic.  Requires 2+ assist    Laboratory Studies:  Basic Metabolic Panel:  Recent Labs Lab 07/27/14 1539 07/27/14 1545  NA 137 138  K 4.1 4.0  CL 105 102  CO2 22  --   GLUCOSE 96 98  BUN 18 17  CREATININE 0.86 0.90  CALCIUM 9.1  --     Liver Function Tests:  Recent Labs Lab 07/27/14 1539  AST 28  ALT 32  ALKPHOS 75  BILITOT 0.7  PROT 7.0  ALBUMIN 4.3   No results for  input(s): LIPASE, AMYLASE in the last 168 hours. No results for input(s): AMMONIA in the last 168 hours.  CBC:  Recent Labs Lab 07/27/14 1539 07/27/14 1545  WBC 9.7  --   NEUTROABS 6.2  --   HGB 16.2 17.0  HCT 46.8 50.0  MCV 90.0  --   PLT 224  --     Cardiac Enzymes: No results for input(s): CKTOTAL, CKMB, CKMBINDEX, TROPONINI in the last 168 hours.  BNP: Invalid input(s): POCBNP  CBG: No results for input(s): GLUCAP in the last 168 hours.  Microbiology: No results found for this or any previous visit.  Coagulation Studies:  Recent Labs  07/27/14 1539  LABPROT 13.0  INR 0.97    Urinalysis:  Recent Labs Lab 07/27/14 1827  COLORURINE YELLOW  LABSPEC 1.029  PHURINE 6.0  GLUCOSEU NEGATIVE  HGBUR NEGATIVE  BILIRUBINUR NEGATIVE  KETONESUR NEGATIVE  PROTEINUR NEGATIVE  UROBILINOGEN 1.0  NITRITE NEGATIVE  LEUKOCYTESUR SMALL*    Lipid Panel: No results found for: CHOL, TRIG, HDL, CHOLHDL, VLDL, LDLCALC  HgbA1C: No results found for: HGBA1C  Urine Drug Screen:     Component Value Date/Time   LABOPIA NONE DETECTED 07/27/2014 1827   COCAINSCRNUR NONE DETECTED 07/27/2014 1827  LABBENZ NONE DETECTED 07/27/2014 1827   AMPHETMU NONE DETECTED 07/27/2014 1827   THCU NONE DETECTED 07/27/2014 1827   LABBARB NONE DETECTED 07/27/2014 1827    Alcohol Level:  Recent Labs Lab 07/27/14 1518  ETH <5    Other results: EKG: sinus rhythm at 62 bpm.  Imaging: Dg Chest 2 View  07/27/2014   CLINICAL DATA:  Left CVA.  EXAM: CHEST  2 VIEW  COMPARISON:  08/31/2012  FINDINGS: The cardiomediastinal silhouette is unremarkable.  There is no evidence of focal airspace disease, pulmonary edema, suspicious pulmonary nodule/mass, pleural effusion, or pneumothorax. No acute bony abnormalities are identified.  IMPRESSION: No active cardiopulmonary disease.   Electronically Signed   By: Margarette Canada M.D.   On: 07/27/2014 16:43   Ct Head Wo Contrast  07/27/2014   CLINICAL DATA:   64 year old male with acute confusion, left hand and left leg weakness for 1 day. Initial encounter.  EXAM: CT HEAD WITHOUT CONTRAST  TECHNIQUE: Contiguous axial images were obtained from the base of the skull through the vertex without intravenous contrast.  COMPARISON:  None.  FINDINGS: Hypodensity within the right frontoparietal region involves the cortex and compatible with an acute-subacute infarct. A calcification/clot within the right MCA branch supplying this region is noted. There is no evidence of hemorrhage.  No other intracranial abnormalities are identified, including mass lesion, hydrocephalus, extra-axial fluid collection, or midline shift.  Chronic right maxillary sinus disease noted.  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 No evidence of hemorrhage.  Chronic right maxillary sinusitis.  Critical Value/emergent results were called by telephone at the time of interpretation on 07/27/2014 at 4:31 pm to Dr. Shirlyn Goltz , who verbally acknowledged these results.   Electronically Signed   By: Margarette Canada M.D.   On: 07/27/2014 16:31   Mr Brain Wo Contrast  07/27/2014   CLINICAL DATA:  64 yo male with acute confusion, LEFT hand and LEFT leg weakness for 1 day. Initial encounter. Stroke risk factors include hypertension.  EXAM: MRI HEAD WITHOUT CONTRAST  TECHNIQUE: Multiplanar, multiecho pulse sequences of the brain and surrounding structures were obtained without intravenous contrast.  COMPARISON:  CT head earlier today.  FINDINGS: Moderately large area of restricted diffusion affecting the RIGHT insula, and posterior limb internal capsule, along with the posterior frontal and temporal lobes representing acute infarction due to RIGHT MCA M3 branch occlusion. Small calcific embolus, responsible for the infarction, is lodged in the posterior aspect RIGHT sylvian fissure (image 14 series 8) as identified more readily  on CT.  No acute hemorrhage, mass lesion, hydrocephalus, or extra-axial fluid.  No proximal large vessel occlusion of the internal carotid, middle cerebral M1, or M2 segments. Basilar artery and both vertebral arteries display patent flow voids.  Normal cerebral volume. Mild subcortical and periventricular T2 and FLAIR hyperintensities, likely chronic microvascular ischemic change. Flow voids are maintained in the internal carotid, basilar, and both vertebral arteries. Pituitary, pineal, and cerebellar tonsils unremarkable. No upper cervical lesions. Calvarium intact. Chronic sinus disease in the RIGHT maxillary and RIGHT frontal sinuses without air-fluid level. Negative orbits. No mastoid fluid.  IMPRESSION: Moderately large area of nonhemorrhagic acute infarction affecting the RIGHT hemisphere as described, responsible for LEFT-sided weakness.  Small calcific embolus to an M3 branch of the RIGHT middle cerebral artery is responsible.  Mild small vessel disease.  Chronic sinusitis.   Electronically Signed   By: Rolla Flatten M.D.   On: 07/27/2014  18:21    Assessment: 64 y.o. male with acute onset left sided numbness.  Presented outside time window for tPA.  MRI of the brain personally reviewed and shows an acute right hemispheric infarct.  Patient on no antiplatelet or anticoagulant therapy at home.    Stroke Risk Factors - hypertension and smoking  Plan: 1. HgbA1c, fasting lipid panel 2. MRA  of the brain without contrast 3. PT consult, OT consult, Speech consult 4. Echocardiogram 5. Carotid dopplers 6. Prophylactic therapy-Antiplatelet med: Aspirin - dose 325mg  daily 7. NPO until RN stroke swallow screen 8. Telemetry monitoring 9. Frequent neuro checks   Alexis Goodell, MD [facility] [phone] 07/28/2014, 5:32 AM

## 2014-07-28 NOTE — Progress Notes (Signed)
Pt arrived via [facility] to 4N19. Pt is alert and oriented and stating left side numbness.  Safety measures in place. Telemetry applied.  Will continue to monitor.

## 2014-07-28 NOTE — Progress Notes (Signed)
[facility]                                                                              Patient Demographics  Daniel [name], is a 64 y.o. male, DOB - 1950/10/28, AJG:[id number]  Admit date - 07/27/2014   Admitting Physician Robbie Lis, MD  Outpatient Primary MD for the patient is No primary care provider on file.  LOS - 1   Chief Complaint  Patient presents with  . Numbness    left arm and leg   Interim history     64 year old male with no significant past medical history presented with sudden onset of numbness in the left side including hand as well as leg. Patient had dizziness and felt he could not maintain his balance. 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 Patient was transferred to [facility], neurology was consulted. Pending further workup. Assessment & Plan   Acute CVA -CT head: Acute to subacute right frontoparietal infarct with calcification/clot within the right MCA branch -MRI brain: Moderately large area of nonhemorrhagic acute infarction affecting the right hemisphere, small calcific embolus in 3 branch of the right MCA -Pending CT angiogram the neck and head, echocardiogram, hemoglobin A1c -Lipid panel: Total cholesterol 95, triglycerides 119, HDL 32, LDL 129 -Neurology consulted and appreciated -PT and OT consultation appreciated -Currently on aspirin 325 mg daily -Will start patient on statin -Will allow for permissive hypertension  Hyperlipidemia -Lipid panel: Total cholesterol 95, triglycerides 119, HDL 32, LDL 129 -Will start statin  Tobacco abuse -Patient counseled on the need for nicotine and tobacco cessation  Code Status: Full  Family Communication: Wife and son at bedside  Disposition Plan: Admitted, pending further workup  Time Spent in minutes   30 minutes  Procedures  None  Consults   Neurology  DVT Prophylaxis  SCDs  Lab  Results  Component Value Date   PLT 224 07/27/2014    Medications  Scheduled Meds: . aspirin EC  325 mg Oral Daily   Continuous Infusions:  PRN Meds:.acetaminophen, hydrALAZINE, senna-docusate  Antibiotics    Anti-infectives    None      Subjective:   Daniel [name] seen and examined today. Patient feels that his strength is normal however continues to have decreased sensation in his left hand and leg. He feels his grip has improved. Patient denies any chest pain or shortness of breath, dizziness at this time. Patient does complain of slight headache.   Objective:   Filed Vitals:   07/28/14 0000 07/28/14 0200 07/28/14 0400 07/28/14 0600  BP: 155/61 154/71 147/64 167/84  Pulse: 66 76 62 62  Temp: 98.8 F (37.1 C) 98.8 F (37.1 C) 98.1 F (36.7 C) 98.6 F (37 C)  TempSrc: Oral Axillary Axillary Oral  Resp: 18 16 16 16   Height:      SpO2: 96% 95% 97% 97%    Wt Readings from Last 3 Encounters:  07/27/14 87.544 kg (193 lb)  08/31/12 87.544 kg (193 lb)  11/14/11 88.451 kg (195 lb)    No intake or output data in the 24 hours ending 07/28/14 3559  Exam  General: Well developed, well nourished, NAD, appears stated age  HEENT: NCAT, mucous membranes moist.   Cardiovascular: S1 S2 auscultated, no rubs, murmurs or gallops. Regular rate and rhythm.  Respiratory: Clear to auscultation bilaterally with equal chest rise  Abdomen: Soft, nontender, nondistended, + bowel sounds  Extremities: warm dry without cyanosis clubbing or edema  Neuro: AAOx3, cranial nerves grossly intact. Strength 5/5 in patient's upper and lower extremities bilaterally. Decreased sensation LUE and LLE  Psych: Normal affect and demeanor with intact judgement and insight  Data Review   Micro Results No results found for this or any previous visit (from the past 240 hour(s)).  Radiology Reports Dg Chest 2 View  07/27/2014   CLINICAL DATA:  Left CVA.  EXAM: CHEST  2 VIEW  COMPARISON:   08/31/2012  FINDINGS: The cardiomediastinal silhouette is unremarkable.  There is no evidence of focal airspace disease, pulmonary edema, suspicious pulmonary nodule/mass, pleural effusion, or pneumothorax. No acute bony abnormalities are identified.  IMPRESSION: No active cardiopulmonary disease.   Electronically Signed   By: Margarette Canada M.D.   On: 07/27/2014 16:43   Ct Head Wo Contrast  07/27/2014   CLINICAL DATA:  64 year old male with acute confusion, left hand and left leg weakness for 1 day. Initial encounter.  EXAM: CT HEAD WITHOUT CONTRAST  TECHNIQUE: Contiguous axial images were obtained from the base of the skull through the vertex without intravenous contrast.  COMPARISON:  None.  FINDINGS: Hypodensity within the right frontoparietal region involves the cortex and compatible with an acute-subacute infarct. A calcification/clot within the right MCA branch supplying this region is noted. There is no evidence of hemorrhage.  No other intracranial abnormalities are identified, including mass lesion, hydrocephalus, extra-axial fluid collection, or midline shift.  Chronic right maxillary sinus disease noted.  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 No evidence of hemorrhage.  Chronic right maxillary sinusitis.  Critical Value/emergent results were called by telephone at the time of interpretation on 07/27/2014 at 4:31 pm to Dr. Shirlyn Goltz , who verbally acknowledged these results.   Electronically Signed   By: Margarette Canada M.D.   On: 07/27/2014 16:31   Mr Brain Wo Contrast  07/27/2014   CLINICAL DATA:  64 yo male with acute confusion, LEFT hand and LEFT leg weakness for 1 day. Initial encounter. Stroke risk factors include hypertension.  EXAM: MRI HEAD WITHOUT CONTRAST  TECHNIQUE: Multiplanar, multiecho pulse sequences of the brain and surrounding structures were obtained without intravenous contrast.  COMPARISON:   CT head earlier today.  FINDINGS: Moderately large area of restricted diffusion affecting the RIGHT insula, and posterior limb internal capsule, along with the posterior frontal and temporal lobes representing acute infarction due to RIGHT MCA M3 branch occlusion. Small calcific embolus, responsible for the infarction, is lodged in the posterior aspect RIGHT sylvian fissure (image 14 series 8) as identified more readily on CT.  No acute hemorrhage, mass lesion, hydrocephalus, or extra-axial fluid.  No proximal large vessel occlusion of the internal carotid, middle cerebral M1, or M2 segments. Basilar artery and both vertebral arteries display patent flow voids.  Normal cerebral volume. Mild subcortical and periventricular T2 and FLAIR hyperintensities, likely chronic microvascular ischemic change. Flow voids are maintained in the internal carotid, basilar, and both vertebral arteries. Pituitary, pineal, and cerebellar tonsils unremarkable. No upper cervical lesions. Calvarium intact. Chronic sinus disease in the RIGHT maxillary and RIGHT frontal sinuses without air-fluid level. Negative  orbits. No mastoid fluid.  IMPRESSION: Moderately large area of nonhemorrhagic acute infarction affecting the RIGHT hemisphere as described, responsible for LEFT-sided weakness.  Small calcific embolus to an M3 branch of the RIGHT middle cerebral artery is responsible.  Mild small vessel disease.  Chronic sinusitis.   Electronically Signed   By: Rolla Flatten M.D.   On: 07/27/2014 18:21    CBC  Recent Labs Lab 07/27/14 1539 07/27/14 1545  WBC 9.7  --   HGB 16.2 17.0  HCT 46.8 50.0  PLT 224  --   MCV 90.0  --   MCH 31.2  --   MCHC 34.6  --   RDW 13.0  --   LYMPHSABS 2.6  --   MONOABS 0.7  --   EOSABS 0.1  --   BASOSABS 0.1  --     Chemistries   Recent Labs Lab 07/27/14 1539 07/27/14 1545  NA 137 138  K 4.1 4.0  CL 105 102  CO2 22  --   GLUCOSE 96 98  BUN 18 17  CREATININE 0.86 0.90  CALCIUM 9.1  --     AST 28  --   ALT 32  --   ALKPHOS 75  --   BILITOT 0.7  --    ------------------------------------------------------------------------------------------------------------------ estimated creatinine clearance is 89.5 mL/min (by C-G formula based on Cr of 0.9). ------------------------------------------------------------------------------------------------------------------ No results for input(s): HGBA1C in the last 72 hours. ------------------------------------------------------------------------------------------------------------------  Recent Labs  07/28/14 0744  CHOL 185  HDL 32*  LDLCALC 129*  TRIG 119  CHOLHDL 5.8   ------------------------------------------------------------------------------------------------------------------ No results for input(s): TSH, T4TOTAL, T3FREE, THYROIDAB in the last 72 hours.  Invalid input(s): FREET3 ------------------------------------------------------------------------------------------------------------------ No results for input(s): VITAMINB12, FOLATE, FERRITIN, TIBC, IRON, RETICCTPCT in the last 72 hours.  Coagulation profile  Recent Labs Lab 07/27/14 1539  INR 0.97    No results for input(s): DDIMER in the last 72 hours.  Cardiac Enzymes No results for input(s): CKMB, TROPONINI, MYOGLOBIN in the last 168 hours.  Invalid input(s): CK ------------------------------------------------------------------------------------------------------------------ Invalid input(s): POCBNP    Daniel [name] D.O. on 07/28/2014 at 9:27 AM  Between 7am to 7pm - Pager - [phone]  After 7pm go to [email or web address] - password [email or web address]  And look for the night coverage person covering for me after hours  [facility] Office  [phone]

## 2014-07-28 NOTE — Progress Notes (Signed)
PT Cancellation Note  Patient Details Name: Daniel [name] MRN: [id number] DOB: 1950-08-08   Cancelled Treatment:    Reason Eval/Treat Not Completed: Patient not medically ready. Pt with active bedrest order.  Please update activity level, when appropriate, for PT to proceed with eval. Thank you.   Lorriane Shire 07/28/2014, 9:42 AM

## 2014-07-29 DIAGNOSIS — R208 Other disturbances of skin sensation: Secondary | ICD-10-CM

## 2014-07-29 DIAGNOSIS — I639 Cerebral infarction, unspecified: Secondary | ICD-10-CM

## 2014-07-29 DIAGNOSIS — I69898 Other sequelae of other cerebrovascular disease: Secondary | ICD-10-CM

## 2014-07-29 DIAGNOSIS — F17210 Nicotine dependence, cigarettes, uncomplicated: Secondary | ICD-10-CM | POA: Insufficient documentation

## 2014-07-29 DIAGNOSIS — I63411 Cerebral infarction due to embolism of right middle cerebral artery: Secondary | ICD-10-CM

## 2014-07-29 DIAGNOSIS — E785 Hyperlipidemia, unspecified: Secondary | ICD-10-CM | POA: Insufficient documentation

## 2014-07-29 DIAGNOSIS — I6789 Other cerebrovascular disease: Secondary | ICD-10-CM

## 2014-07-29 LAB — HEMOGLOBIN A1C
Hgb A1c MFr Bld: 5.3 % (ref 4.8–5.6)
MEAN PLASMA GLUCOSE: 105 mg/dL

## 2014-07-29 MED ORDER — ATORVASTATIN CALCIUM 10 MG PO TABS
20.0000 mg | ORAL_TABLET | Freq: Every day | ORAL | Status: DC
  Administered 2014-07-29 – 2014-07-31 (×2): 20 mg via ORAL
  Filled 2014-07-29 (×3): qty 2

## 2014-07-29 NOTE — Progress Notes (Signed)
Rehab admissions - I met with pt in follow up to rehab MD consult and shared that rehab MD is recommending home with follow up outpatient therapy. Latest PT note also has this recommendation. Pt was very pleasant and I explained that case manager would help to set up outpt therapy needs. Pt had no further questions.   I will now sign off pt's case. Thanks.  Nanetta Batty, PT Rehabilitation Admissions Coordinator [phone]

## 2014-07-29 NOTE — Progress Notes (Signed)
STROKE TEAM PROGRESS NOTE   HISTORY ASHELY [name] is a RH 63 y.o. male who reports that on Friday he was working in the yard and was at baseline. When he went into the house he noted while in the kitchen acute onset left hand numbness (LKW 07/26/2014 1300). This progressed to involve the arm and then the LLE. He became off balance and was walking into walls. He felt as if he was drunk. Noted some twitching of the left eye as well for a time. He did not seek medical attention at that time. Saturday 3/19/016 felt as if he was getting some RLE numbness as well and presented for evaluation.  Patient was not administered TPA secondary to delay in arrival. He was admitted for further evaluation and treatment.   SUBJECTIVE (INTERVAL HISTORY) His brother and nephew are at the bedside.  Overall he feels his condition is stable. He reported some unsteadiness with gait that is improving.   OBJECTIVE Temp:  [97.5 F (36.4 C)-98.8 F (37.1 C)] 97.9 F (36.6 C) (03/21 0943) Pulse Rate:  [58-68] 67 (03/21 0943) Cardiac Rhythm:  [-] Normal sinus rhythm (03/20 2020) Resp:  [18] 18 (03/21 0943) BP: (148-195)/(64-93) 168/93 mmHg (03/21 0943) SpO2:  [95 %-100 %] 97 % (03/21 0943) Weight:  [87.54 kg (192 lb 15.9 oz)] 87.54 kg (192 lb 15.9 oz) (03/20 1700)   Recent Labs Lab 07/28/14 0622  GLUCAP 101*    Recent Labs Lab 07/27/14 1539 07/27/14 1545  NA 137 138  K 4.1 4.0  CL 105 102  CO2 22  --   GLUCOSE 96 98  BUN 18 17  CREATININE 0.86 0.90  CALCIUM 9.1  --     Recent Labs Lab 07/27/14 1539  AST 28  ALT 32  ALKPHOS 75  BILITOT 0.7  PROT 7.0  ALBUMIN 4.3    Recent Labs Lab 07/27/14 1539 07/27/14 1545  WBC 9.7  --   NEUTROABS 6.2  --   HGB 16.2 17.0  HCT 46.8 50.0  MCV 90.0  --   PLT 224  --    No results for input(s): CKTOTAL, CKMB, CKMBINDEX, TROPONINI in the last 168 hours.  Recent Labs  07/27/14 1539  LABPROT 13.0  INR 0.97    Recent Labs  07/27/14 1827   COLORURINE YELLOW  LABSPEC 1.029  PHURINE 6.0  GLUCOSEU NEGATIVE  HGBUR NEGATIVE  BILIRUBINUR NEGATIVE  KETONESUR NEGATIVE  PROTEINUR NEGATIVE  UROBILINOGEN 1.0  NITRITE NEGATIVE  LEUKOCYTESUR SMALL*       Component Value Date/Time   CHOL 185 07/28/2014 0744   TRIG 119 07/28/2014 0744   HDL 32* 07/28/2014 0744   CHOLHDL 5.8 07/28/2014 0744   VLDL 24 07/28/2014 0744   LDLCALC 129* 07/28/2014 0744   Lab Results  Component Value Date   HGBA1C 5.3 07/28/2014      Component Value Date/Time   LABOPIA NONE DETECTED 07/27/2014 1827   COCAINSCRNUR NONE DETECTED 07/27/2014 1827   LABBENZ NONE DETECTED 07/27/2014 1827   AMPHETMU NONE DETECTED 07/27/2014 1827   THCU NONE DETECTED 07/27/2014 1827   LABBARB NONE DETECTED 07/27/2014 1827     Recent Labs Lab 07/27/14 1518  ETH <5    Ct Angio Head W/cm &/or Wo Cm  07/28/2014   CLINICAL DATA:  Sudden onset LEFT-sided weakness beginning 07/17/2014. Initial encounter.  EXAM: CT ANGIOGRAPHY HEAD AND NECK  TECHNIQUE: Multidetector CT imaging of the head and neck was performed using the standard protocol during bolus administration of  intravenous contrast. Multiplanar CT image reconstructions and MIPs were obtained to evaluate the vascular anatomy. Carotid stenosis measurements (when applicable) are obtained utilizing NASCET criteria, using the distal internal carotid diameter as the denominator.  CONTRAST:  42mL OMNIPAQUE IOHEXOL 350 MG/ML SOLN  COMPARISON:  CT head 07/27/2014.  MR head 07/27/2014.  FINDINGS: CT HEAD  Calvarium and skull base: No fracture or destructive lesion. Mastoids and middle ears are grossly clear.  Paranasal sinuses: Imaged portions are clear, except for moderate paranasal sinus disease RIGHT maxillary region.  Orbits: Negative.  Brain: No hemorrhage or neoplasm. Normal cerebral volume. Calcific embolus to a posterior sylvian M3 branch with developing cytotoxic edema throughout posterior frontal and temporal lobes as  well as the adjacent white matter.  CTA NECK  Aortic arch: Standard branching. Imaged portion shows no evidence of aneurysm or dissection. No significant stenosis of the major arch vessel origins. Moderate calcific and soft plaque at the innominate origin and LEFT common carotid origin.  Right carotid system: There is extensive soft plaque and to a lesser degree calcific plaque at the RIGHT ICA origin extending for length of 2 cm cephalad into the proximal RIGHT internal carotid. Stenosis measurements of 2.3 mm proximal and 5.0 mm distal yield a slightly greater than 50% stenosis, non flow reducing, but the irregular appearance of the soft plaque strongly suggests that this vessel could represent the embolic source.  Left carotid system: Moderate calcific and soft plaque without flow reducing stenosis. Luminal diameter of 2.9 mm. Proximal and 4.6 mm distal calculates to less than 50% stenosis. No evidence of dissection, stenosis (50% or greater) or occlusion.  Vertebral arteries: LEFT minimally larger. No evidence of dissection, stenosis (50% or greater) or occlusion.  Nonvascular structures: No adenopathy. Airway midline. Lung apices show no mass or pneumothorax. No worrisome osseous lesions. Cervical spondylosis. Normal thyroid.  CTA HEAD  Anterior circulation: 1 x 4 mm calcific embolus to a RIGHT MCA M3 branch in the sylvian fissure. Distal to this, there is cytotoxic edema with absent perfusion and diminished enhancement. No large vessel occlusion of the proximal RIGHT or LEFT ICA or RIGHT or LEFT MCA M1 segment. Normal anterior cerebrals. No significant stenosis, proximal occlusion, aneurysm, or vascular malformation.  Posterior circulation: No significant stenosis, proximal occlusion, aneurysm, or vascular malformation.  Venous sinuses: As permitted by contrast timing, patent.  Anatomic variants: None of significance.  Delayed phase: No abnormal intracranial enhancement. No luxury perfusion has developed at  this time at the site of infarction.  IMPRESSION: Re-demonstration of acute RIGHT frontotemporal infarction due to a calcific embolus to a RIGHT MCA M3 branch.  Moderately irregular and extensive mixed soft plaque and calcific plaque at the RIGHT internal carotid artery origin likely represents the embolic source. Slightly greater than 50% stenosis is non flow reducing. See discussion above.   Electronically Signed   By: Rolla Flatten M.D.   On: 07/28/2014 11:24   Dg Chest 2 View  07/27/2014   CLINICAL DATA:  Left CVA.  EXAM: CHEST  2 VIEW  COMPARISON:  08/31/2012  FINDINGS: The cardiomediastinal silhouette is unremarkable.  There is no evidence of focal airspace disease, pulmonary edema, suspicious pulmonary nodule/mass, pleural effusion, or pneumothorax. No acute bony abnormalities are identified.  IMPRESSION: No active cardiopulmonary disease.   Electronically Signed   By: Margarette Canada M.D.   On: 07/27/2014 16:43   Ct Head Wo Contrast  07/27/2014   CLINICAL DATA:  64 year old male with acute confusion, left hand and left  leg weakness for 1 day. Initial encounter.  EXAM: CT HEAD WITHOUT CONTRAST  TECHNIQUE: Contiguous axial images were obtained from the base of the skull through the vertex without intravenous contrast.  COMPARISON:  None.  FINDINGS: Hypodensity within the right frontoparietal region involves the cortex and compatible with an acute-subacute infarct. A calcification/clot within the right MCA branch supplying this region is noted. There is no evidence of hemorrhage.  No other intracranial abnormalities are identified, including mass lesion, hydrocephalus, extra-axial fluid collection, or midline shift.  Chronic right maxillary sinus disease noted.  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 No evidence of hemorrhage.  Chronic right maxillary sinusitis.  Critical Value/emergent results were called by  telephone at the time of interpretation on 07/27/2014 at 4:31 pm to Dr. Shirlyn Goltz , who verbally acknowledged these results.   Electronically Signed   By: Margarette Canada M.D.   On: 07/27/2014 16:31   Ct Angio Neck W/cm &/or Wo/cm  07/28/2014   CLINICAL DATA:  Sudden onset LEFT-sided weakness beginning 07/17/2014. Initial encounter.  EXAM: CT ANGIOGRAPHY HEAD AND NECK  TECHNIQUE: Multidetector CT imaging of the head and neck was performed using the standard protocol during bolus administration of intravenous contrast. Multiplanar CT image reconstructions and MIPs were obtained to evaluate the vascular anatomy. Carotid stenosis measurements (when applicable) are obtained utilizing NASCET criteria, using the distal internal carotid diameter as the denominator.  CONTRAST:  41mL OMNIPAQUE IOHEXOL 350 MG/ML SOLN  COMPARISON:  CT head 07/27/2014.  MR head 07/27/2014.  FINDINGS: CT HEAD  Calvarium and skull base: No fracture or destructive lesion. Mastoids and middle ears are grossly clear.  Paranasal sinuses: Imaged portions are clear, except for moderate paranasal sinus disease RIGHT maxillary region.  Orbits: Negative.  Brain: No hemorrhage or neoplasm. Normal cerebral volume. Calcific embolus to a posterior sylvian M3 branch with developing cytotoxic edema throughout posterior frontal and temporal lobes as well as the adjacent white matter.  CTA NECK  Aortic arch: Standard branching. Imaged portion shows no evidence of aneurysm or dissection. No significant stenosis of the major arch vessel origins. Moderate calcific and soft plaque at the innominate origin and LEFT common carotid origin.  Right carotid system: There is extensive soft plaque and to a lesser degree calcific plaque at the RIGHT ICA origin extending for length of 2 cm cephalad into the proximal RIGHT internal carotid. Stenosis measurements of 2.3 mm proximal and 5.0 mm distal yield a slightly greater than 50% stenosis, non flow reducing, but the irregular  appearance of the soft plaque strongly suggests that this vessel could represent the embolic source.  Left carotid system: Moderate calcific and soft plaque without flow reducing stenosis. Luminal diameter of 2.9 mm. Proximal and 4.6 mm distal calculates to less than 50% stenosis. No evidence of dissection, stenosis (50% or greater) or occlusion.  Vertebral arteries: LEFT minimally larger. No evidence of dissection, stenosis (50% or greater) or occlusion.  Nonvascular structures: No adenopathy. Airway midline. Lung apices show no mass or pneumothorax. No worrisome osseous lesions. Cervical spondylosis. Normal thyroid.  CTA HEAD  Anterior circulation: 1 x 4 mm calcific embolus to a RIGHT MCA M3 branch in the sylvian fissure. Distal to this, there is cytotoxic edema with absent perfusion and diminished enhancement. No large vessel occlusion of the proximal RIGHT or LEFT ICA or RIGHT or LEFT MCA M1 segment. Normal anterior cerebrals. No significant stenosis, proximal occlusion, aneurysm, or vascular malformation.  Posterior circulation: No significant stenosis, proximal occlusion, aneurysm, or vascular malformation.  Venous sinuses: As permitted by contrast timing, patent.  Anatomic variants: None of significance.  Delayed phase: No abnormal intracranial enhancement. No luxury perfusion has developed at this time at the site of infarction.  IMPRESSION: Re-demonstration of acute RIGHT frontotemporal infarction due to a calcific embolus to a RIGHT MCA M3 branch.  Moderately irregular and extensive mixed soft plaque and calcific plaque at the RIGHT internal carotid artery origin likely represents the embolic source. Slightly greater than 50% stenosis is non flow reducing. See discussion above.   Electronically Signed   By: Rolla Flatten M.D.   On: 07/28/2014 11:24   Mr Brain Wo Contrast  07/27/2014   CLINICAL DATA:  64 yo male with acute confusion, LEFT hand and LEFT leg weakness for 1 day. Initial encounter. Stroke  risk factors include hypertension.  EXAM: MRI HEAD WITHOUT CONTRAST  TECHNIQUE: Multiplanar, multiecho pulse sequences of the brain and surrounding structures were obtained without intravenous contrast.  COMPARISON:  CT head earlier today.  FINDINGS: Moderately large area of restricted diffusion affecting the RIGHT insula, and posterior limb internal capsule, along with the posterior frontal and temporal lobes representing acute infarction due to RIGHT MCA M3 branch occlusion. Small calcific embolus, responsible for the infarction, is lodged in the posterior aspect RIGHT sylvian fissure (image 14 series 8) as identified more readily on CT.  No acute hemorrhage, mass lesion, hydrocephalus, or extra-axial fluid.  No proximal large vessel occlusion of the internal carotid, middle cerebral M1, or M2 segments. Basilar artery and both vertebral arteries display patent flow voids.  Normal cerebral volume. Mild subcortical and periventricular T2 and FLAIR hyperintensities, likely chronic microvascular ischemic change. Flow voids are maintained in the internal carotid, basilar, and both vertebral arteries. Pituitary, pineal, and cerebellar tonsils unremarkable. No upper cervical lesions. Calvarium intact. Chronic sinus disease in the RIGHT maxillary and RIGHT frontal sinuses without air-fluid level. Negative orbits. No mastoid fluid.  IMPRESSION: Moderately large area of nonhemorrhagic acute infarction affecting the RIGHT hemisphere as described, responsible for LEFT-sided weakness.  Small calcific embolus to an M3 branch of the RIGHT middle cerebral artery is responsible.  Mild small vessel disease.  Chronic sinusitis.   Electronically Signed   By: Rolla Flatten M.D.   On: 07/27/2014 18:21     PHYSICAL EXAM Pleasant obese caucasian middle aged male not in distress. . Afebrile. Head is nontraumatic. Neck is supple without bruit.    Cardiac exam no murmur or gallop. Lungs are clear to auscultation. Distal pulses are well  felt. Neurological Exam :  Awake alert oriented x 3 normal speech and language. Mild left lower face asymmetry. Tongue midline. No drift. Mild diminished fine finger movements on left. Orbits right over left upper extremity. Mild left grip weak.. Normal sensation . Normal coordination. ASSESSMENT/PLAN Daniel [name] is a 64 y.o. male with no significant past medical history  presenting with feeling off balance, left arm and leg numbness. He did not receive IV t-PA due to delay in arrival.   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 Arranged  with [place] for Wednesday (schedule full for Tues when I called).  If positive for PFO (patent foramen ovale), check bilateral lower extremity venous dopplers to rule out DVT as possible source of stroke. (I have made patient NPO after midnight Tues night). **if pt desires to go home, the TEE can be done as an OP. Will need to contact Cardiology for timing. Also, will need to ask how to address loop placement at that time  If TEE negative, a [facility] electrophysiologist will consult and consider placement of an implantable loop recorder to evaluate for atrial fibrillation as etiology of stroke. This has been explained to patient/family by Dr. Leonie Man and they are agreeable.   HgbA1c 5.3  SCDs for VTE prophylaxis  Diet Heart thin liquids  no antithrombotic prior to admission, now on aspirin 325 mg orally every day  Patient counseled to be compliant with his antithrombotic medications  Ongoing aggressive stroke risk factor management  Therapy  recommendations:  CIR. Consult in place. Too good for CIR per Dr. Letta Pate.   Disposition:  Return home, OP therapy   In the event he is discharged, Follow-up Stroke Clinic at [facility] with Dr. Antony Contras in 2 months, order placed.   Hypertension  No hisotry, on no home meds:    Elevated. BP 148-195/83-93 past 24h (07/29/2014 @ 12:38 PM)  Hyperlipidemia  Home meds:  No statin  LDL 129, goal < 70  Add statin, lipitor 20  Continue statin at discharge  Other Stroke Risk Factors  Cigarette smoker, advised to stop smoking  Hospital day # [place] for Pager information 07/29/2014 12:39 PM  I have personally examined this patient, reviewed notes, independently viewed imaging studies, participated in medical decision making and plan of care. I have made any additions or clarifications directly to the above note. Agree with note above. He has an embolic right MCA branch infarct etiology to be determined. He remains at risk for recurrent strokes and TIAs and needs ongoing stroke evaluation as well as aggressive risk factor modification. Check TEE and loop recorder. Discussed with patient and her friend.  Antony Contras, MD Medical Director [facility] Pager: [phone] 07/29/2014 3:48 PM    To contact Stroke Continuity provider, please refer to [email or web address] After hours, contact General Neurology

## 2014-07-29 NOTE — Progress Notes (Signed)
CARE MANAGEMENT NOTE 07/29/2014  Patient:  Daniel [name], Daniel [name]   Account Number:  [id number]  Date Initiated:  07/29/2014  Documentation initiated by:  Olga Coaster  Subjective/Objective Assessment:   ADMITTED WITH STROKE     Action/Plan:   CM FOLLOWING FOR DCP   Anticipated DC Date:  08/01/2014   Anticipated DC Plan: POSSIBLY IP REHAB FACILITY     DC Planning Services  CM consult       Status of service:  In process, will continue to follow  Per UR Regulation:  Reviewed for med. necessity/level of care/duration of stay  Comments:  3/21/2016Mindi Slicker RN,BSN,MHA [phone]

## 2014-07-29 NOTE — Progress Notes (Signed)
PT Cancellation Note  Patient Details Name: AMRIT CRESS MRN: [id number] DOB: 01/01/1951   Cancelled Treatment:    Reason Eval/Treat Not Completed: Patient at procedure or test/unavailable. Speech Therapy just initiated session. Will see later today   Dorine Duffey 07/29/2014, 11:42 AM Pager [phone]

## 2014-07-29 NOTE — Progress Notes (Signed)
Patient was screened by Shyenne Maggard for appropriateness for an Inpatient Acute Rehab consult.  At this time, we are recommending Inpatient Rehab consult.  Please order when you feel appropriate.   Baptiste Littler PT Inpatient Rehab Admissions Coordinator Cell [phone] Office [phone]  

## 2014-07-29 NOTE — Evaluation (Signed)
Speech Language Pathology Evaluation Patient Details Name: Daniel [name] MRN: [id number] DOB: 1950/10/03 Today's Date: 07/29/2014 Time: 1030 (time split due to slp being called away, total 37 minues)-1107 SLP Time Calculation (min) (ACUTE ONLY): 37 min  Problem List:  Patient Active Problem List   Diagnosis Date Noted  . CVA (cerebral infarction) 07/27/2014  . Essential hypertension    Past Medical History:  Past Medical History  Diagnosis Date  . Essential hypertension    Past Surgical History:  Past Surgical History  Procedure Laterality Date  . Circumcision     HPI:  64 year old male with no significant past medical history presented with sudden onset of numbness in the left side including hand as well as leg on 07/27/14. Patient had dizziness and felt he could not maintain his balance. 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 Speech eval ordered.  PT evaluation recommends CIR.     Assessment / Plan / Recommendation Clinical Impression  Pt presents with intact expressive/receptive language and speech skills.  Minimal word finding deficit noted x2 during testing.  Areas of challenges include high level cognition including visuospatial/executive skills, abstract thinking, judgement and awareness.  Pt's memory was excellent for 5 word recall task as well as attention for basic tasks.  Score on MOCA was 21/30 with 26 or above being normal.  Given pt's job requires high level cognitive abilities, recommend follow up SLP to maximize his cognitive linguistic skills/rehabiliation.  Pt agrees to plan to work with SLP on high level cognitive skills and questioned if recommendations are due to his CVA - to which SLP replied yes.      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, high school diploma Vocation: Full time employment   Cognition  Overall Cognitive Status: Impaired/Different from baseline Arousal/Alertness: Awake/alert Orientation Level: Oriented X4 Attention: Sustained;Selective;Alternating Sustained Attention: Appears intact (for basic tasks, high level difficult for pt) Selective Attention: Appears intact (pt tuned out family in room) Alternating Attention: Appears intact Awareness: Impaired Awareness Impairment: Intellectual impairment (decr awareness to vision, suspected high level cognitive deficits) Problem Solving: Impaired (higher level impaired) Problem Solving Impairment: Verbal complex;Functional complex Executive Function: Self Monitoring;Self Correcting Self Monitoring: Impaired Self Correcting: Impaired Safety/Judgment: Impaired (pt concerned re: running his business - decr awareness to medical condition)    Comprehension  Auditory Comprehension Overall Auditory Comprehension: Appears within functional limits for tasks assessed Yes/No Questions: Not tested Commands: Within Functional Limits Conversation: Complex Visual Recognition/Discrimination Discrimination: Not tested Reading Comprehension Reading Status: Within funtional limits (for basic, reading clock, basic sentence, needs furhter testing)    Expression Expression Primary Mode of Expression: Verbal Verbal Expression Overall Verbal Expression: Appears within functional limits for tasks assessed (occasional  word finding deficits) Level of Generative/Spontaneous Verbalization: Conversation Repetition:  (WFL, except fine details, articles, etc in sentence) Naming: No impairment Interfering Components: Attention  Non-Verbal Means of Communication: Not applicable Other Verbal Expression Comments: pt named 6 items in 60 seconds Written Expression Dominant Hand: Right Written Expression: Exceptions to [facility] (copy cylinder impaired)   Oral / Motor Oral Motor/Sensory Function Overall Oral Motor/Sensory Function: Appears within functional limits for tasks assessed Motor Speech Overall Motor Speech: Appears within functional limits for tasks assessed   [place], [place] [facility] SLP [phone]

## 2014-07-29 NOTE — Evaluation (Signed)
Occupational Therapy Evaluation Patient Details Name: Daniel [name] MRN: [id number] DOB: [birth year] Today's Date: 07/29/2014    History of Present Illness 64 year old male with no significant past medical history presented with sudden onset of numbness in the left side including hand as well as leg. Patient had dizziness and felt he could not maintain his balance. 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    Clinical Impression   Pt admitted with above. He demonstrates the below listed deficits and will benefit from continued OT to maximize safety and independence with BADLs.  Pt presents to OT with impaired cognition and mild Lt inattention.   He struggled significantly with serial counting backwards from 100 by 2's, and reports this is not his baseline.  He was able to alternate and divide his attention, but gets distracted easily, but is able to redirect his attention to task he is performing;however, his speed of processing decreases considerably while multi tasking.  Due to this recommend that pt does not drive, and recommend OT driving assessment at OP due to his job requirements.  Recommend OPOT and 24 hour supervision       Follow Up Recommendations  Outpatient OT;Supervision/Assistance - 24 hour    Equipment Recommendations  None recommended by OT    Recommendations for Other Services       Precautions / Restrictions Precautions Precautions: Fall      Mobility Bed Mobility Overal bed mobility: Independent;Needs Assistance Bed Mobility: Supine to Sit     Supine to sit: Supervision Sit to supine: Supervision   General bed mobility comments: left his LUE behind upon sitting EOB and impulsive therefore requires supervision  Transfers Overall transfer level: Needs assistance Equipment used: None Transfers: Sit to/from Stand;Stand Pivot Transfers Sit to Stand:  Supervision Stand pivot transfers: Supervision       General transfer comment: unsteady with stagger step x 1; repeated x 4    Balance Overall balance assessment: Needs assistance   Sitting balance-Leahy Scale: Normal     Standing balance support: During functional activity Standing balance-Leahy Scale: Good Standing balance comment: Pt able to retrieve item from floor multiple times without LOB, but is impulsive requiring min guard assist    Single Leg Stance - Left Leg: 15 Tandem Stance - Right Leg: 12   Rhomberg - Eyes Opened: 60 (significant incr sway to the Left) Rhomberg - Eyes Closed: 10 High level balance activites: Direction changes;Backward walking;Turns;Sudden stops;Head turns High Level Balance Comments: unsteady with veering off path (see cognition section re: pathfinding) Standardized Balance Assessment Standardized Balance Assessment : Dynamic Gait Index   Dynamic Gait Index Level Surface: Mild Impairment Change in Gait Speed: Moderate Impairment Gait with Horizontal Head Turns: Moderate Impairment Gait with Vertical Head Turns: Moderate Impairment Gait and Pivot Turn: Mild Impairment Step Over Obstacle: Moderate Impairment Step Around Obstacles: Moderate Impairment Steps: Mild Impairment Total Score: 11      ADL Overall ADL's : Needs assistance/impaired Eating/Feeding: Independent;Sitting   Grooming: Wash/dry hands;Wash/dry face;Oral care;Brushing hair;Supervision/safety;Standing   Upper Body Bathing: Set up;Sitting   Lower Body Bathing: Supervison/ safety;Sit to/from stand   Upper Body Dressing : Set up;Sitting   Lower Body Dressing: Supervision/safety;Sit to/from stand   Toilet Transfer: Supervision/safety;Ambulation;Comfort height toilet   Toileting- Clothing Manipulation and Hygiene: Supervision/safety;Sit to/from stand   Tub/ Shower Transfer: Supervision/safety;Ambulation   Functional mobility during ADLs: Supervision/safety General ADL  Comments: Pt able to retrieve  items from cabinets and floors with supervision      Vision Vision Assessment?: Yes Eye Alignment: Within Functional Limits Ocular Range of Motion: Within Functional Limits Tracking/Visual Pursuits: Able to track stimulus in all quads without difficulty Visual Fields: No apparent deficits Additional Comments: Pt able to implement an organized scan path to locate items in vending machine independently.   Pt noted Lt inattention which was not as apparent during OT session    Perception Perception Perception Tested?: Yes Perception Deficits: Inattention/neglect Inattention/Neglect: Does not attend to left side of body Spatial deficits: mild Lt inattention.  Did drop item in Lt hand x 1 when attention was not driven toward Lt side    Praxis Praxis Praxis tested?: Within functional limits    Pertinent Vitals/Pain Pain Assessment: No/denies pain     Hand Dominance Right   Extremity/Trunk Assessment Upper Extremity Assessment Upper Extremity Assessment: LUE deficits/detail LUE Deficits / Details: Pt with impaired Lt touch Lt hand resulting in mildly impaired FMC  LUE Sensation: decreased light touch LUE Coordination: decreased fine motor   Lower Extremity Assessment Lower Extremity Assessment: Defer to PT evaluation   Cervical / Trunk Assessment Cervical / Trunk Assessment: Normal   Communication Communication Communication: No difficulties   Cognition Arousal/Alertness: Awake/alert Behavior During Therapy: Impulsive Overall Cognitive Status: Impaired/Different from baseline Area of Impairment: Attention;Problem solving   Current Attention Level: Alternating;Divided (with min cues )     Safety/Judgement: Decreased awareness of safety;Decreased awareness of deficits Awareness: Emergent Problem Solving: Difficulty sequencing;Requires verbal cues General Comments: Pt able to perform serial counting to 100 by 2's with min cues, but struggled  significantly counting backwards by 2's from 100 - required mod verbal cues.  Pt able to toss ball Lt to Rt hand whild ambulating while counting (assist and difficulty with counting).  He is very talkative and did self distract, but able to re-direct himself back to task at hand without cuing    General Comments       Exercises       Shoulder Instructions      Home Living Family/patient expects to be discharged to:: Private residence Living Arrangements: Spouse/significant other Available Help at Discharge: Family;Available 24 hours/day Type of Home: House Home Access: Level entry     Home Layout: One level     Bathroom Shower/Tub: Teacher, early years/pre: Standard     Home Equipment: None   Additional Comments: Pt reports spouse works from home and can provide transportation to [employer] With: Spouse    Prior Functioning/Environment Level of Independence: Independent        Comments: 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    OT Diagnosis: Cognitive deficits;Disturbance of vision   OT Problem List: Impaired vision/perception;Decreased cognition;Decreased safety awareness   OT Treatment/Interventions: Therapeutic activities;Cognitive remediation/compensation;Visual/perceptual remediation/compensation;Patient/family education    OT Goals(Current goals can be found in the care plan section) Acute Rehab OT Goals Patient Stated Goal: to get better  OT Goal Formulation: With patient Time For Goal Achievement: 08/05/14 Potential to Achieve Goals: Good ADL Goals Additional ADL Goal #1: Pt will be able to alternate and divide attention during novel tasks with no more than 2 cues.  Additional ADL Goal #2: Pt will perform pathfinding activity on unit with supervision and no cues  Additional ADL Goal #3: Pt will independently verbalize his deficits and impact on function   OT Frequency: Min 2X/week   Barriers to D/C:  Co-evaluation               End of Session Nurse Communication: Mobility status  Activity Tolerance: Patient tolerated treatment well Patient left: in chair;with call bell/phone within reach;with chair alarm set   Time: 1348-1430 OT Time Calculation (min): 42 min Charges:  OT General Charges $OT Visit: 1 Procedure OT Evaluation $Initial OT Evaluation Tier I: 1 Procedure OT Treatments $Therapeutic Activity: 23-37 mins G-Codes:    Sandhya Denherder M 07/30/14, 3:20 PM

## 2014-07-29 NOTE — Progress Notes (Addendum)
[facility]                                                                              Patient Demographics  Daniel [name], is a 64 y.o. male, DOB - Sep 17, 1950, GMW:[id number]  Admit date - 07/27/2014   Admitting Physician Robbie Lis, MD  Outpatient Primary MD for the patient is No primary care provider on file.  LOS - 2   Chief Complaint  Patient presents with  . Numbness    left arm and leg   Interim history     64 year old male with no significant past medical history presented with sudden onset of numbness in the left side including hand as well as leg. Patient had dizziness and felt he could not maintain his balance. 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 Patient was transferred to [facility], neurology was consulted. Pending further workup.  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 -Neurology follow-up appreciated. -PT and OT consultation appreciated-outpatient therapies.  -Currently on aspirin 325 mg daily. Not on antithrombotic prior to admission. -Will allow for permissive hypertension - Neurology recommends TEE to look for embolic source (scheduled for 07/31/14). If positive will need bilateral lower extremity Dopplers. If negative, needs loop recorder placed.  Hyperlipidemia -Lipid panel: Total cholesterol 95, triglycerides 119,  HDL 32, LDL 129 -Started statin  Tobacco abuse -Patient counseled on the need for nicotine and tobacco cessation  Essential hypertension -Newly diagnosed. Allow permissive hypertension due to recent acute stroke.   Code Status: Full Family Communication: Discussed with spouse.  Disposition Plan: Admitted, pending further workup  Time Spent in minutes   30 minutes  Procedures  None  Consults   Neurology  DVT Prophylaxis  SCDs  Lab Results  Component Value Date   PLT 224 07/27/2014    Medications  Scheduled Meds: . aspirin EC  325 mg Oral Daily  . atorvastatin  20 mg Oral q1800   Continuous Infusions:  PRN Meds:.acetaminophen, hydrALAZINE, senna-docusate  Antibiotics    Anti-infectives    None      Subjective:   [name] Complains of mild weakness and numbness and left upper extremity. Much improved compared to prior. Denies any other complaints.  Objective:   Filed Vitals:   07/29/14 0608 07/29/14 0943 07/29/14 1324 07/29/14 1721  BP: 176/64 168/93 196/87 196/95  Pulse: 60 67 68 63  Temp: 97.5 F (36.4 C) 97.9 F (36.6 C) 98.2 F (36.8 C) 98 F (36.7 C)  TempSrc: Oral Oral Oral Oral  Resp: 18 18 18 18   Height:      Weight:  SpO2: 98% 97% 94% 97%    Wt Readings from Last 3 Encounters:  07/28/14 87.54 kg (192 lb 15.9 oz)  07/27/14 87.544 kg (193 lb)  08/31/12 87.544 kg (193 lb)    No intake or output data in the 24 hours ending 07/29/14 1728  Exam  General: Well developed, well nourished, NAD, appears stated age  HEENT: NCAT, mucous membranes moist.   Cardiovascular: S1 S2 auscultated, no rubs, murmurs or gallops. Regular rate and rhythm.Telemetry: Sinus rhythm.   Respiratory: Clear to auscultation bilaterally with equal chest rise  Abdomen: Soft, nontender, nondistended, + bowel sounds  Extremities: warm dry without cyanosis clubbing or edema  Neuro: AAOx3, cranial nerves grossly intact. Strength 5/5 in patient's upper and  lower extremities bilaterally. Decreased sensation LUE. No pronator drift.   Psych: Normal affect and demeanor with intact judgement and insight  Data Review   Micro Results No results found for this or any previous visit (from the past 240 hour(s)).  Radiology Reports Ct Angio Head W/cm &/or Wo Cm  07/28/2014   CLINICAL DATA:  Sudden onset LEFT-sided weakness beginning 07/17/2014. Initial encounter.  EXAM: CT ANGIOGRAPHY HEAD AND NECK  TECHNIQUE: Multidetector CT imaging of the head and neck was performed using the standard protocol during bolus administration of intravenous contrast. Multiplanar CT image reconstructions and MIPs were obtained to evaluate the vascular anatomy. Carotid stenosis measurements (when applicable) are obtained utilizing NASCET criteria, using the distal internal carotid diameter as the denominator.  CONTRAST:  74mL OMNIPAQUE IOHEXOL 350 MG/ML SOLN  COMPARISON:  CT head 07/27/2014.  MR head 07/27/2014.  FINDINGS: CT HEAD  Calvarium and skull base: No fracture or destructive lesion. Mastoids and middle ears are grossly clear.  Paranasal sinuses: Imaged portions are clear, except for moderate paranasal sinus disease RIGHT maxillary region.  Orbits: Negative.  Brain: No hemorrhage or neoplasm. Normal cerebral volume. Calcific embolus to a posterior sylvian M3 branch with developing cytotoxic edema throughout posterior frontal and temporal lobes as well as the adjacent white matter.  CTA NECK  Aortic arch: Standard branching. Imaged portion shows no evidence of aneurysm or dissection. No significant stenosis of the major arch vessel origins. Moderate calcific and soft plaque at the innominate origin and LEFT common carotid origin.  Right carotid system: There is extensive soft plaque and to a lesser degree calcific plaque at the RIGHT ICA origin extending for length of 2 cm cephalad into the proximal RIGHT internal carotid. Stenosis measurements of 2.3 mm proximal and 5.0 mm distal  yield a slightly greater than 50% stenosis, non flow reducing, but the irregular appearance of the soft plaque strongly suggests that this vessel could represent the embolic source.  Left carotid system: Moderate calcific and soft plaque without flow reducing stenosis. Luminal diameter of 2.9 mm. Proximal and 4.6 mm distal calculates to less than 50% stenosis. No evidence of dissection, stenosis (50% or greater) or occlusion.  Vertebral arteries: LEFT minimally larger. No evidence of dissection, stenosis (50% or greater) or occlusion.  Nonvascular structures: No adenopathy. Airway midline. Lung apices show no mass or pneumothorax. No worrisome osseous lesions. Cervical spondylosis. Normal thyroid.  CTA HEAD  Anterior circulation: 1 x 4 mm calcific embolus to a RIGHT MCA M3 branch in the sylvian fissure. Distal to this, there is cytotoxic edema with absent perfusion and diminished enhancement. No large vessel occlusion of the proximal RIGHT or LEFT ICA or RIGHT or LEFT MCA M1 segment. Normal anterior cerebrals. No significant stenosis, proximal occlusion, aneurysm, or  vascular malformation.  Posterior circulation: No significant stenosis, proximal occlusion, aneurysm, or vascular malformation.  Venous sinuses: As permitted by contrast timing, patent.  Anatomic variants: None of significance.  Delayed phase: No abnormal intracranial enhancement. No luxury perfusion has developed at this time at the site of infarction.  IMPRESSION: Re-demonstration of acute RIGHT frontotemporal infarction due to a calcific embolus to a RIGHT MCA M3 branch.  Moderately irregular and extensive mixed soft plaque and calcific plaque at the RIGHT internal carotid artery origin likely represents the embolic source. Slightly greater than 50% stenosis is non flow reducing. See discussion above.   Electronically Signed   By: Rolla Flatten M.D.   On: 07/28/2014 11:24   Dg Chest 2 View  07/27/2014   CLINICAL DATA:  Left CVA.  EXAM: CHEST  2  VIEW  COMPARISON:  08/31/2012  FINDINGS: The cardiomediastinal silhouette is unremarkable.  There is no evidence of focal airspace disease, pulmonary edema, suspicious pulmonary nodule/mass, pleural effusion, or pneumothorax. No acute bony abnormalities are identified.  IMPRESSION: No active cardiopulmonary disease.   Electronically Signed   By: Margarette Canada M.D.   On: 07/27/2014 16:43   Ct Head Wo Contrast  07/27/2014   CLINICAL DATA:  64 year old male with acute confusion, left hand and left leg weakness for 1 day. Initial encounter.  EXAM: CT HEAD WITHOUT CONTRAST  TECHNIQUE: Contiguous axial images were obtained from the base of the skull through the vertex without intravenous contrast.  COMPARISON:  None.  FINDINGS: Hypodensity within the right frontoparietal region involves the cortex and compatible with an acute-subacute infarct. A calcification/clot within the right MCA branch supplying this region is noted. There is no evidence of hemorrhage.  No other intracranial abnormalities are identified, including mass lesion, hydrocephalus, extra-axial fluid collection, or midline shift.  Chronic right maxillary sinus disease noted.  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 No evidence of hemorrhage.  Chronic right maxillary sinusitis.  Critical Value/emergent results were called by telephone at the time of interpretation on 07/27/2014 at 4:31 pm to Dr. Shirlyn Goltz , who verbally acknowledged these results.   Electronically Signed   By: Margarette Canada M.D.   On: 07/27/2014 16:31   Ct Angio Neck W/cm &/or Wo/cm  07/28/2014   CLINICAL DATA:  Sudden onset LEFT-sided weakness beginning 07/17/2014. Initial encounter.  EXAM: CT ANGIOGRAPHY HEAD AND NECK  TECHNIQUE: Multidetector CT imaging of the head and neck was performed using the standard protocol during bolus administration of intravenous contrast. Multiplanar CT image  reconstructions and MIPs were obtained to evaluate the vascular anatomy. Carotid stenosis measurements (when applicable) are obtained utilizing NASCET criteria, using the distal internal carotid diameter as the denominator.  CONTRAST:  33mL OMNIPAQUE IOHEXOL 350 MG/ML SOLN  COMPARISON:  CT head 07/27/2014.  MR head 07/27/2014.  FINDINGS: CT HEAD  Calvarium and skull base: No fracture or destructive lesion. Mastoids and middle ears are grossly clear.  Paranasal sinuses: Imaged portions are clear, except for moderate paranasal sinus disease RIGHT maxillary region.  Orbits: Negative.  Brain: No hemorrhage or neoplasm. Normal cerebral volume. Calcific embolus to a posterior sylvian M3 branch with developing cytotoxic edema throughout posterior frontal and temporal lobes as well as the adjacent white matter.  CTA NECK  Aortic arch: Standard branching. Imaged portion shows no evidence of aneurysm or dissection. No significant stenosis of the major arch vessel origins. Moderate calcific and soft plaque at the innominate origin and  LEFT common carotid origin.  Right carotid system: There is extensive soft plaque and to a lesser degree calcific plaque at the RIGHT ICA origin extending for length of 2 cm cephalad into the proximal RIGHT internal carotid. Stenosis measurements of 2.3 mm proximal and 5.0 mm distal yield a slightly greater than 50% stenosis, non flow reducing, but the irregular appearance of the soft plaque strongly suggests that this vessel could represent the embolic source.  Left carotid system: Moderate calcific and soft plaque without flow reducing stenosis. Luminal diameter of 2.9 mm. Proximal and 4.6 mm distal calculates to less than 50% stenosis. No evidence of dissection, stenosis (50% or greater) or occlusion.  Vertebral arteries: LEFT minimally larger. No evidence of dissection, stenosis (50% or greater) or occlusion.  Nonvascular structures: No adenopathy. Airway midline. Lung apices show no mass or  pneumothorax. No worrisome osseous lesions. Cervical spondylosis. Normal thyroid.  CTA HEAD  Anterior circulation: 1 x 4 mm calcific embolus to a RIGHT MCA M3 branch in the sylvian fissure. Distal to this, there is cytotoxic edema with absent perfusion and diminished enhancement. No large vessel occlusion of the proximal RIGHT or LEFT ICA or RIGHT or LEFT MCA M1 segment. Normal anterior cerebrals. No significant stenosis, proximal occlusion, aneurysm, or vascular malformation.  Posterior circulation: No significant stenosis, proximal occlusion, aneurysm, or vascular malformation.  Venous sinuses: As permitted by contrast timing, patent.  Anatomic variants: None of significance.  Delayed phase: No abnormal intracranial enhancement. No luxury perfusion has developed at this time at the site of infarction.  IMPRESSION: Re-demonstration of acute RIGHT frontotemporal infarction due to a calcific embolus to a RIGHT MCA M3 branch.  Moderately irregular and extensive mixed soft plaque and calcific plaque at the RIGHT internal carotid artery origin likely represents the embolic source. Slightly greater than 50% stenosis is non flow reducing. See discussion above.   Electronically Signed   By: Rolla Flatten M.D.   On: 07/28/2014 11:24   Mr Brain Wo Contrast  07/27/2014   CLINICAL DATA:  64 yo male with acute confusion, LEFT hand and LEFT leg weakness for 1 day. Initial encounter. Stroke risk factors include hypertension.  EXAM: MRI HEAD WITHOUT CONTRAST  TECHNIQUE: Multiplanar, multiecho pulse sequences of the brain and surrounding structures were obtained without intravenous contrast.  COMPARISON:  CT head earlier today.  FINDINGS: Moderately large area of restricted diffusion affecting the RIGHT insula, and posterior limb internal capsule, along with the posterior frontal and temporal lobes representing acute infarction due to RIGHT MCA M3 branch occlusion. Small calcific embolus, responsible for the infarction, is lodged  in the posterior aspect RIGHT sylvian fissure (image 14 series 8) as identified more readily on CT.  No acute hemorrhage, mass lesion, hydrocephalus, or extra-axial fluid.  No proximal large vessel occlusion of the internal carotid, middle cerebral M1, or M2 segments. Basilar artery and both vertebral arteries display patent flow voids.  Normal cerebral volume. Mild subcortical and periventricular T2 and FLAIR hyperintensities, likely chronic microvascular ischemic change. Flow voids are maintained in the internal carotid, basilar, and both vertebral arteries. Pituitary, pineal, and cerebellar tonsils unremarkable. No upper cervical lesions. Calvarium intact. Chronic sinus disease in the RIGHT maxillary and RIGHT frontal sinuses without air-fluid level. Negative orbits. No mastoid fluid.  IMPRESSION: Moderately large area of nonhemorrhagic acute infarction affecting the RIGHT hemisphere as described, responsible for LEFT-sided weakness.  Small calcific embolus to an M3 branch of the RIGHT middle cerebral artery is responsible.  Mild small vessel disease.  Chronic sinusitis.   Electronically Signed   By: Rolla Flatten M.D.   On: 07/27/2014 18:21    CBC  Recent Labs Lab 07/27/14 1539 07/27/14 1545  WBC 9.7  --   HGB 16.2 17.0  HCT 46.8 50.0  PLT 224  --   MCV 90.0  --   MCH 31.2  --   MCHC 34.6  --   RDW 13.0  --   LYMPHSABS 2.6  --   MONOABS 0.7  --   EOSABS 0.1  --   BASOSABS 0.1  --     Chemistries   Recent Labs Lab 07/27/14 1539 07/27/14 1545  NA 137 138  K 4.1 4.0  CL 105 102  CO2 22  --   GLUCOSE 96 98  BUN 18 17  CREATININE 0.86 0.90  CALCIUM 9.1  --   AST 28  --   ALT 32  --   ALKPHOS 75  --   BILITOT 0.7  --    ------------------------------------------------------------------------------------------------------------------ estimated creatinine clearance is 89.5 mL/min (by C-G formula based on Cr of  0.9). ------------------------------------------------------------------------------------------------------------------  Recent Labs  07/28/14 0744  HGBA1C 5.3   ------------------------------------------------------------------------------------------------------------------  Recent Labs  07/28/14 0744  CHOL 185  HDL 32*  LDLCALC 129*  TRIG 119  CHOLHDL 5.8   ------------------------------------------------------------------------------------------------------------------ No results for input(s): TSH, T4TOTAL, T3FREE, THYROIDAB in the last 72 hours.  Invalid input(s): FREET3 ------------------------------------------------------------------------------------------------------------------ No results for input(s): VITAMINB12, FOLATE, FERRITIN, TIBC, IRON, RETICCTPCT in the last 72 hours.  Coagulation profile  Recent Labs Lab 07/27/14 1539  INR 0.97    No results for input(s): DDIMER in the last 72 hours.  Cardiac Enzymes No results for input(s): CKMB, TROPONINI, MYOGLOBIN in the last 168 hours.  Invalid input(s): CK ------------------------------------------------------------------------------------------------------------------ Invalid input(s): POCBNP    Novelle Addair, MD, FACP, FHM. [facility] Pager [phone]  If 7PM-7AM, please contact night-coverage [email or web address] Password [facility] 07/29/2014, 5:39 PM

## 2014-07-29 NOTE — Consult Note (Signed)
Physical Medicine and Rehabilitation Consult Reason for Consult: Acute/subacute right frontotemporal infarct Referring Physician: [facility]    HPI: Daniel [name] is a 64 y.o. right handed male with history of essential hypertension. Admitted 07/27/2014 with left-sided weakness and numbness. Patient independent prior to admission living with wife and still working full-time. 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 MRI showed moderately large area of nonhemorrhagic acute infarct right hemisphere. CT angiogram of head and neck showed anterior circulation 1 x 4 mm calcific embolus right MCA M3 branch in the sylvian fissure. No significant stenosis or proximal occlusion. Echocardiogram and carotid Dopplers pending. Patient did not receive TPA. Neurology consulted placed on aspirin for CVA prophylaxis. Patient on a regular consistency diet. Physical therapy evaluation completed 07/28/2014 with recommendations of physical medicine rehabilitation consult.  Patient primarily complains of numbness on the left side of his body after his stroke. He does not feel he is very weak. He does have problems grasping objects and letting go since the stroke  Review of Systems  Gastrointestinal: Positive for constipation.  Musculoskeletal: Positive for myalgias.  All other systems reviewed and are negative.  Past Medical History  Diagnosis Date  . Essential hypertension    Past Surgical History  Procedure Laterality Date  . Circumcision     Family History  Problem Relation Age of Onset  . Heart failure Father   . Hypertension Father   . Heart failure Brother   . Hypertension Brother   . Heart failure Paternal Uncle   . Hypertension Paternal Uncle    Social History:  reports that he has been smoking Cigarettes.  He has been smoking about 0.25 packs per day. He has never used smokeless tobacco. He reports that he does not drink  alcohol or use illicit drugs. Allergies: No Known Allergies Medications Prior to Admission  Medication Sig Dispense Refill  . Aspirin-Acetaminophen-Caffeine 500-325-65 MG PACK Take 1 Package by mouth every 6 (six) hours as needed (pain).    Marland Kitchen ibuprofen (ADVIL,MOTRIN) 200 MG tablet Take 800 mg by mouth every 6 (six) hours as needed for moderate pain.    . Multiple Vitamin (MULTIVITAMIN WITH MINERALS) TABS tablet Take 1 tablet by mouth daily.    . naproxen sodium (ANAPROX) 220 MG tablet Take 440 mg by mouth 2 (two) times daily as needed (pain).    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 Functional Status:  Mobility: Bed Mobility Overal bed mobility: Modified Independent Transfers Overall transfer level: Needs assistance Equipment used: None Transfers: Sit to/from Stand, Stand Pivot Transfers Sit to Stand: Min guard Stand pivot transfers: Min guard General transfer comment: min guard for safety/verbal cues Ambulation/Gait Ambulation/Gait assistance: Min guard Ambulation Distance (Feet): 150 Feet Assistive device: None Gait Pattern/deviations: Narrow base of support, Decreased stride length, Step-through pattern Gait velocity: mildly decreased    ADL:    Cognition: Cognition Overall Cognitive Status: Impaired/Different from baseline Orientation Level: Oriented X4 Cognition Arousal/Alertness: Awake/alert Behavior  During Therapy: Impulsive Overall Cognitive Status:  Impaired/Different from baseline Area of Impairment: Safety/judgement, Problem solving Memory: Decreased recall of precautions Safety/Judgement: Decreased awareness of safety, Decreased awareness of deficits Problem Solving: Difficulty sequencing, Requires verbal cues  Blood pressure 176/64, pulse 60, temperature 97.5 F (36.4 C), temperature source Oral, resp. rate 18, height 5\' 11"  (1.803 m), weight 87.54 kg (192 lb 15.9 oz), SpO2 98 %. Physical Exam  Vitals reviewed. Constitutional: He is oriented to person, place, and time. He appears well-developed.  HENT:  Head: Normocephalic.  Eyes: EOM are normal.  Neck: Normal range of motion. Neck supple. No thyromegaly present.  Cardiovascular: Normal rate and regular rhythm.   Respiratory: Effort normal and breath sounds normal. No respiratory distress.  GI: Soft. Bowel sounds are normal. He exhibits no distension.  Neurological: He is alert and oriented to person, place, and time.  Follows full commands and moves all extremities  Skin: Skin is warm and dry.  Motor strength is 4/5 in the left deltoid, biceps, triceps, grip 5/5 in the right deltoid, biceps, triceps, grip 5/5 bilateral hip flexor and extensor ankle dorsi flexor plantar flexion Sensations absent to light touch in the left upper and left lower limb. He does have intact proprioception in the lower limb.  Mood and affect are appropriate  No results found for this or any previous visit (from the past 24 hour(s)). Ct Angio Head W/cm &/or Wo Cm  07/28/2014   CLINICAL DATA:  Sudden onset LEFT-sided weakness beginning 07/17/2014. Initial encounter.  EXAM: CT ANGIOGRAPHY HEAD AND NECK  TECHNIQUE: Multidetector CT imaging of the head and neck was performed using the standard protocol during bolus administration of intravenous contrast. Multiplanar CT image reconstructions and MIPs were obtained to evaluate the vascular anatomy. Carotid stenosis measurements (when applicable) are obtained  utilizing NASCET criteria, using the distal internal carotid diameter as the denominator.  CONTRAST:  52mL OMNIPAQUE IOHEXOL 350 MG/ML SOLN  COMPARISON:  CT head 07/27/2014.  MR head 07/27/2014.  FINDINGS: CT HEAD  Calvarium and skull base: No fracture or destructive lesion. Mastoids and middle ears are grossly clear.  Paranasal sinuses: Imaged portions are clear, except for moderate paranasal sinus disease RIGHT maxillary region.  Orbits: Negative.  Brain: No hemorrhage or neoplasm. Normal cerebral volume. Calcific embolus to a posterior sylvian M3 branch with developing cytotoxic edema throughout posterior frontal and temporal lobes as well as the adjacent white matter.  CTA NECK  Aortic arch: Standard branching. Imaged portion shows no evidence of aneurysm or dissection. No significant stenosis of the major arch vessel origins. Moderate calcific and soft plaque at the innominate origin and LEFT common carotid origin.  Right carotid system: There is extensive soft plaque and to a lesser degree calcific plaque at the RIGHT ICA origin extending for length of 2 cm cephalad into the proximal RIGHT internal carotid. Stenosis measurements of 2.3 mm proximal and 5.0 mm distal yield a slightly greater than 50% stenosis, non flow reducing, but the irregular appearance of the soft plaque strongly suggests that this vessel could represent the embolic source.  Left carotid system: Moderate calcific and soft plaque without flow reducing stenosis. Luminal diameter of 2.9 mm. Proximal and 4.6 mm distal calculates to less than 50% stenosis. No evidence of dissection, stenosis (50% or greater) or occlusion.  Vertebral arteries: LEFT minimally larger. No evidence of dissection, stenosis (50% or greater) or occlusion.  Nonvascular structures: No adenopathy. Airway midline. Lung apices show no mass or pneumothorax. No worrisome osseous lesions.  Cervical spondylosis. Normal thyroid.  CTA HEAD  Anterior circulation: 1 x 4 mm calcific  embolus to a RIGHT MCA M3 branch in the sylvian fissure. Distal to this, there is cytotoxic edema with absent perfusion and diminished enhancement. No large vessel occlusion of the proximal RIGHT or LEFT ICA or RIGHT or LEFT MCA M1 segment. Normal anterior cerebrals. No significant stenosis, proximal occlusion, aneurysm, or vascular malformation.  Posterior circulation: No significant stenosis, proximal occlusion, aneurysm, or vascular malformation.  Venous sinuses: As permitted by contrast timing, patent.  Anatomic variants: None of significance.  Delayed phase: No abnormal intracranial enhancement. No luxury perfusion has developed at this time at the site of infarction.  IMPRESSION: Re-demonstration of acute RIGHT frontotemporal infarction due to a calcific embolus to a RIGHT MCA M3 branch.  Moderately irregular and extensive mixed soft plaque and calcific plaque at the RIGHT internal carotid artery origin likely represents the embolic source. Slightly greater than 50% stenosis is non flow reducing. See discussion above.   Electronically Signed   By: Rolla Flatten M.D.   On: 07/28/2014 11:24   Dg Chest 2 View  07/27/2014   CLINICAL DATA:  Left CVA.  EXAM: CHEST  2 VIEW  COMPARISON:  08/31/2012  FINDINGS: The cardiomediastinal silhouette is unremarkable.  There is no evidence of focal airspace disease, pulmonary edema, suspicious pulmonary nodule/mass, pleural effusion, or pneumothorax. No acute bony abnormalities are identified.  IMPRESSION: No active cardiopulmonary disease.   Electronically Signed   By: Margarette Canada M.D.   On: 07/27/2014 16:43   Ct Head Wo Contrast  07/27/2014   CLINICAL DATA:  64 year old male with acute confusion, left hand and left leg weakness for 1 day. Initial encounter.  EXAM: CT HEAD WITHOUT CONTRAST  TECHNIQUE: Contiguous axial images were obtained from the base of the skull through the vertex without intravenous contrast.  COMPARISON:  None.  FINDINGS: Hypodensity within the right  frontoparietal region involves the cortex and compatible with an acute-subacute infarct. A calcification/clot within the right MCA branch supplying this region is noted. There is no evidence of hemorrhage.  No other intracranial abnormalities are identified, including mass lesion, hydrocephalus, extra-axial fluid collection, or midline shift.  Chronic right maxillary sinus disease noted.  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 No evidence of hemorrhage.  Chronic right maxillary sinusitis.  Critical Value/emergent results were called by telephone at the time of interpretation on 07/27/2014 at 4:31 pm to Dr. Shirlyn Goltz , who verbally acknowledged these results.   Electronically Signed   By: Margarette Canada M.D.   On: 07/27/2014 16:31   Ct Angio Neck W/cm &/or Wo/cm  07/28/2014   CLINICAL DATA:  Sudden onset LEFT-sided weakness beginning 07/17/2014. Initial encounter.  EXAM: CT ANGIOGRAPHY HEAD AND NECK  TECHNIQUE: Multidetector CT imaging of the head and neck was performed using the standard protocol during bolus administration of intravenous contrast. Multiplanar CT image reconstructions and MIPs were obtained to evaluate the vascular anatomy. Carotid stenosis measurements (when applicable) are obtained utilizing NASCET criteria, using the distal internal carotid diameter as the denominator.  CONTRAST:  62mL OMNIPAQUE IOHEXOL 350 MG/ML SOLN  COMPARISON:  CT head 07/27/2014.  MR head 07/27/2014.  FINDINGS: CT HEAD  Calvarium and skull base: No fracture or destructive lesion. Mastoids and middle ears are grossly clear.  Paranasal sinuses: Imaged portions are clear, except for moderate paranasal sinus disease RIGHT maxillary region.  Orbits: Negative.  Brain: No  hemorrhage or neoplasm. Normal cerebral volume. Calcific embolus to a posterior sylvian M3 branch with developing cytotoxic edema throughout posterior frontal and  temporal lobes as well as the adjacent white matter.  CTA NECK  Aortic arch: Standard branching. Imaged portion shows no evidence of aneurysm or dissection. No significant stenosis of the major arch vessel origins. Moderate calcific and soft plaque at the innominate origin and LEFT common carotid origin.  Right carotid system: There is extensive soft plaque and to a lesser degree calcific plaque at the RIGHT ICA origin extending for length of 2 cm cephalad into the proximal RIGHT internal carotid. Stenosis measurements of 2.3 mm proximal and 5.0 mm distal yield a slightly greater than 50% stenosis, non flow reducing, but the irregular appearance of the soft plaque strongly suggests that this vessel could represent the embolic source.  Left carotid system: Moderate calcific and soft plaque without flow reducing stenosis. Luminal diameter of 2.9 mm. Proximal and 4.6 mm distal calculates to less than 50% stenosis. No evidence of dissection, stenosis (50% or greater) or occlusion.  Vertebral arteries: LEFT minimally larger. No evidence of dissection, stenosis (50% or greater) or occlusion.  Nonvascular structures: No adenopathy. Airway midline. Lung apices show no mass or pneumothorax. No worrisome osseous lesions. Cervical spondylosis. Normal thyroid.  CTA HEAD  Anterior circulation: 1 x 4 mm calcific embolus to a RIGHT MCA M3 branch in the sylvian fissure. Distal to this, there is cytotoxic edema with absent perfusion and diminished enhancement. No large vessel occlusion of the proximal RIGHT or LEFT ICA or RIGHT or LEFT MCA M1 segment. Normal anterior cerebrals. No significant stenosis, proximal occlusion, aneurysm, or vascular malformation.  Posterior circulation: No significant stenosis, proximal occlusion, aneurysm, or vascular malformation.  Venous sinuses: As permitted by contrast timing, patent.  Anatomic variants: None of significance.  Delayed phase: No abnormal intracranial enhancement. No luxury perfusion  has developed at this time at the site of infarction.  IMPRESSION: Re-demonstration of acute RIGHT frontotemporal infarction due to a calcific embolus to a RIGHT MCA M3 branch.  Moderately irregular and extensive mixed soft plaque and calcific plaque at the RIGHT internal carotid artery origin likely represents the embolic source. Slightly greater than 50% stenosis is non flow reducing. See discussion above.   Electronically Signed   By: Rolla Flatten M.D.   On: 07/28/2014 11:24   Mr Brain Wo Contrast  07/27/2014   CLINICAL DATA:  64 yo male with acute confusion, LEFT hand and LEFT leg weakness for 1 day. Initial encounter. Stroke risk factors include hypertension.  EXAM: MRI HEAD WITHOUT CONTRAST  TECHNIQUE: Multiplanar, multiecho pulse sequences of the brain and surrounding structures were obtained without intravenous contrast.  COMPARISON:  CT head earlier today.  FINDINGS: Moderately large area of restricted diffusion affecting the RIGHT insula, and posterior limb internal capsule, along with the posterior frontal and temporal lobes representing acute infarction due to RIGHT MCA M3 branch occlusion. Small calcific embolus, responsible for the infarction, is lodged in the posterior aspect RIGHT sylvian fissure (image 14 series 8) as identified more readily on CT.  No acute hemorrhage, mass lesion, hydrocephalus, or extra-axial fluid.  No proximal large vessel occlusion of the internal carotid, middle cerebral M1, or M2 segments. Basilar artery and both vertebral arteries display patent flow voids.  Normal cerebral volume. Mild subcortical and periventricular T2 and FLAIR hyperintensities, likely chronic microvascular ischemic change. Flow voids are maintained in the internal carotid, basilar, and both vertebral arteries. Pituitary, pineal, and cerebellar  tonsils unremarkable. No upper cervical lesions. Calvarium intact. Chronic sinus disease in the RIGHT maxillary and RIGHT frontal sinuses without air-fluid  level. Negative orbits. No mastoid fluid.  IMPRESSION: Moderately large area of nonhemorrhagic acute infarction affecting the RIGHT hemisphere as described, responsible for LEFT-sided weakness.  Small calcific embolus to an M3 branch of the RIGHT middle cerebral artery is responsible.  Mild small vessel disease.  Chronic sinusitis.   Electronically Signed   By: Rolla Flatten M.D.   On: 07/27/2014 18:21    Assessment/Plan: Diagnosis: Right Distal MCA distribution infarct with mild left upper extremity weakness as well as left hemisensory deficits 1. Does the need for close, 24 hr/day medical supervision in concert with the patient's rehab needs make it unreasonable for this patient to be served in a less intensive setting? No 2. Co-Morbidities requiring supervision/potential complications: N/A 3. Due to safety, does the patient require 24 hr/day rehab nursing? No 4. Does the patient require coordinated care of a physician, rehab nurse, PT, OT to address physical and functional deficits in the context of the above medical diagnosis(es)? No Addressing deficits in the following areas: balance, endurance, locomotion, strength, transferring, bathing and dressing 5. Can the patient actively participate in an intensive therapy program of at least 3 hrs of therapy per day at least 5 days per week? Yes 6. The potential for patient to make measurable gains while on inpatient rehab is Limited due to high level at baseline 7. Anticipated functional outcomes upon discharge from inpatient rehab are n/a  with PT, n/a with OT, n/a with SLP. 8. Estimated rehab length of stay to reach the above functional goals is: n/a 9. Does the patient have adequate social supports and living environment to accommodate these discharge functional goals? Yes 10. Anticipated D/C setting: Home 11. Anticipated post D/C treatments: Outpatient therapy 12. Overall Rehab/Functional Prognosis: excellent  RECOMMENDATIONS: This patient's  condition is appropriate for continued rehabilitative care in the following setting: Outpatient Therapy Patient has agreed to participate in recommended program. Yes Note that insurance prior authorization may be required for reimbursement for recommended care.  Comment: For comprehensive intensive inpatient, in-hospital rehabilitation    07/29/2014

## 2014-07-29 NOTE — Progress Notes (Signed)
Echocardiogram 2D Echocardiogram has been performed.  Bao, Bazen 07/29/2014, 11:00 AM

## 2014-07-29 NOTE — Progress Notes (Signed)
Physical Therapy Treatment Patient Details Name: Daniel [name] MRN: [id number] DOB: 1951/04/09 Today's Date: 07/29/2014    History of Present Illness 64 year old male with no significant past medical history presented with sudden onset of numbness in the left side including hand as well as leg. Patient had dizziness and felt he could not maintain his balance. 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     PT Comments    Pt remains a high fall risk with Lt inattention and imbalance (pts scored 11/24 on the Dynamic Gait Index). Pt is impulsive and highly distracted in hallways with staggering. Pt is unable to report the changes he has noticed since his stroke, demonstrating decreased awareness. Wife not present. Pt reports his wife can drive him to [facility]PP[facility].    Follow Up Recommendations  Outpatient PT;Supervision/Assistance - 24 hour (inpt rehab/CIR has denied pt)     Equipment Recommendations  None recommended by PT    Recommendations for Other Services       Precautions / Restrictions Precautions Precautions: Fall    Mobility  Bed Mobility Overal bed mobility: Needs Assistance Bed Mobility: Supine to Sit;Sit to Supine     Supine to sit: Supervision Sit to supine: Supervision   General bed mobility comments: left his LUE behind upon sitting EOB and impulsive therefore requires supervision  Transfers Overall transfer level: Needs assistance Equipment used: None Transfers: Sit to/from Stand Sit to Stand: Min guard         General transfer comment: unsteady with stagger step x 1; repeated x 4  Ambulation/Gait Ambulation/Gait assistance: Min guard Ambulation Distance (Feet): 250 Feet Assistive device: None Gait Pattern/deviations: Step-through pattern;Narrow base of support   Gait velocity interpretation: Below normal speed for age/gender General Gait Details: guarded with  slow velocity (and minimal ability to incr velocity on instruction); staggering and drifting (especially with head turns) ; frequently walked very close to a wall or cart in front of him before he would slow down and divert his path around the obstacle   Stairs Stairs: Yes Stairs assistance: Min guard Stair Management: One rail Right;Alternating pattern;Forwards Number of Stairs: 10 General stair comments: light use of rail on Rt side  Wheelchair Mobility    Modified Rankin (Stroke Patients Only) Modified Rankin (Stroke Patients Only) Pre-Morbid Rankin Score: No symptoms Modified Rankin: Moderately severe disability     Balance Overall balance assessment: Needs assistance         Standing balance support: No upper extremity supported Standing balance-Leahy Scale: Fair     Single Leg Stance - Left Leg: 15 Tandem Stance - Right Leg: 12   Rhomberg - Eyes Opened: 60 (significant incr sway to the Left) Rhomberg - Eyes Closed: 10 High level balance activites: Direction changes;Backward walking;Turns;Sudden stops;Head turns High Level Balance Comments: unsteady with veering off path (see cognition section re: pathfinding)    Cognition Arousal/Alertness: Awake/alert Behavior During Therapy: Impulsive Overall Cognitive Status: Impaired/Different from baseline Area of Impairment: Attention;Safety/judgement;Awareness;Problem solving   Current Attention Level: Sustained     Safety/Judgement: Decreased awareness of safety;Decreased awareness of deficits Awareness: Intellectual Problem Solving: Difficulty sequencing;Requires verbal cues;Requires tactile cues General Comments: Lt inattention and easily distracted (especially in halls with poor attn to tasks); pt unable to identify that he was walking further away from his room when looking at room #s (twice this occured as he passed his room); even with verbal cues to attend to  room numbers, pt unaware that he was walking further  away from his room until specifically asked    Exercises      General Comments General comments (skin integrity, edema, etc.): Lengthy talk re: pt's performance and how far he is from his baseline (pt downplays his deficits). Pt was able to identify that he is not safe to drive      Pertinent Vitals/Pain Pain Assessment: No/denies pain    Home Living                      Prior Function            PT Goals (current goals can now be found in the care plan section) Acute Rehab PT Goals Patient Stated Goal: return to work Progress towards PT goals: Progressing toward goals    Frequency  Min 4X/week    PT Plan Discharge plan needs to be updated    Co-evaluation             End of Session Equipment Utilized During Treatment: Gait belt Activity Tolerance: Patient tolerated treatment well Patient left: in bed;with call bell/phone within reach;with bed alarm set;with family/visitor present     Time: 5597-4163 PT Time Calculation (min) (ACUTE ONLY): 45 min  Charges:  $Gait Training: 38-52 mins                    G Codes:      Ayeza Therriault 08/10/2014, 2:43 PM

## 2014-07-30 ENCOUNTER — Encounter (HOSPITAL_COMMUNITY): Payer: Self-pay | Admitting: Physician Assistant

## 2014-07-30 DIAGNOSIS — I429 Cardiomyopathy, unspecified: Secondary | ICD-10-CM

## 2014-07-30 DIAGNOSIS — R931 Abnormal findings on diagnostic imaging of heart and coronary circulation: Secondary | ICD-10-CM

## 2014-07-30 MED ORDER — LISINOPRIL 5 MG PO TABS
5.0000 mg | ORAL_TABLET | Freq: Every day | ORAL | Status: DC
  Administered 2014-07-30 – 2014-07-31 (×2): 5 mg via ORAL
  Filled 2014-07-30 (×2): qty 1

## 2014-07-30 NOTE — Progress Notes (Signed)
PT Cancellation Note  Patient Details Name: Daniel [name] MRN: [id number] DOB: 05/24/1950   Cancelled Treatment:    Reason Eval/Treat Not Completed: Medical issues which prohibited therapy. Noted pt with BP 191/87. Just recently worked with OT. Will attempt to see 3/23 (noted plans for TEE)   Manuel Lawhead 07/30/2014, 2:46 PM Pager [phone]

## 2014-07-30 NOTE — Consult Note (Signed)
CARDIOLOGY CONSULT NOTE   Patient ID: Daniel [name] MRN: [id number] DOB/AGE: 63-Aug-1952 63 y.o.  Admit date: 07/27/2014  Primary Physician   No primary care provider on file. Primary Cardiologist   New  Reason for Consultation   Newly reduced EF.   HPI: Daniel [name] is a 64 y.o. male with no significant PMH who presented to [facility] on 07/27/14 with sudden onset of numbness in the left side including hand as well as leg and dizziness. CT revealed subacute right frontotemporal infarct and calcification within the right MCA. 2D ECHO was performed which revealed EF 40-45%.   The patient is very defensive about his history and irritataed with being asked the same questions repeadly by different providers. He is followed by Dr. Virgina Jock for primary care but has not been seen in quite some time. He does smoke cigarretse but says only very lightly over the past 5 years. He states he has been very healthy and active. He works out in the yard often but does report having worsening exertional SOB and mild chest pressure over the last month or so. He feels more easily fatigued. His chest pain is described as a very mild chest pressure that does not radiate and is associated with some diaphoresis and SOB. Apparently, his father died in his late 52s from heart disease and had CAD. He denies orthopnea, PND or LE edema.     Past Medical History  Diagnosis Date  . Essential hypertension      Past Surgical History  Procedure Laterality Date  . Circumcision      No Known Allergies  I have reviewed the patient's current medications . aspirin EC  325 mg Oral Daily  . atorvastatin  20 mg Oral q1800     acetaminophen, hydrALAZINE, senna-docusate  Prior to Admission medications   Medication Sig Start Date End Date Taking? Authorizing Provider  Aspirin-Acetaminophen-Caffeine [phone] MG PACK Take 1 Package by mouth every 6 (six) hours as needed (pain).   Yes Historical Provider, MD  ibuprofen  (ADVIL,MOTRIN) 200 MG tablet Take 800 mg by mouth every 6 (six) hours as needed for moderate pain.   Yes Historical Provider, MD  Multiple Vitamin (MULTIVITAMIN WITH MINERALS) TABS tablet Take 1 tablet by mouth daily.   Yes Historical Provider, MD  naproxen sodium (ANAPROX) 220 MG tablet Take 440 mg by mouth 2 (two) times daily as needed (pain).   Yes Historical Provider, MD  azithromycin (ZITHROMAX) 250 MG tablet Take 2 tabs PO x 1 dose, then 1 tab PO QD x 4 days Patient not taking: Reported on 07/27/2014 08/31/12   Deneise Lever, MD     History   Social History  . Marital Status: Married    Spouse Name: N/A  . Number of Children: N/A  . Years of Education: N/A   Occupational History  . Not on file.   Social History Main Topics  . Smoking status: Current Every Day Smoker -- 0.25 packs/day    Types: Cigarettes  . Smokeless tobacco: Never Used  . Alcohol Use: No  . Drug Use: No  . Sexual Activity: Not on file   Other Topics Concern  . Not on file   Social History Narrative    No family status information on file.   Family History  Problem Relation Age of Onset  . Heart failure Father   . Hypertension Father   . Heart failure Brother   . Hypertension Brother   .  Heart failure Paternal Uncle   . Hypertension Paternal Uncle      ROS:  Full 14 point review of systems complete and found to be negative unless listed above.  Physical Exam: Blood pressure 177/70, pulse 55, temperature 98.5 F (36.9 C), temperature source Oral, resp. rate 18, height 5\' 11"  (1.803 m), weight 192 lb 15.9 oz (87.54 kg), SpO2 98 %.  General: Well developed, well nourished, male in no acute distress. tan Head: Eyes PERRLA, No xanthomas.   Normocephalic and atraumatic, oropharynx without edema or exudate. :  Lungs: CTAB Heart: HRRR S1 S2, no rub/gallop, Heart irregular rate and rhythm with S1, S2  murmur. pulses are 2+ extrem.   Neck: No carotid bruits. No lymphadenopathy. No JVD. Abdomen: Bowel  sounds present, abdomen soft and non-tender without masses or hernias noted. Msk:  No spine or cva tenderness. No weakness, no joint deformities or effusions. Extremities: No clubbing or cyanosis.  No edema.  Neuro: Alert and oriented X 3. No focal deficits noted. Psych:  Good affect, responds appropriately Skin: No rashes or lesions noted.  Labs:   Lab Results  Component Value Date   WBC 9.7 07/27/2014   HGB 17.0 07/27/2014   HCT 50.0 07/27/2014   MCV 90.0 07/27/2014   PLT 224 07/27/2014    Recent Labs  07/27/14 1539  INR 0.97    Recent Labs Lab 07/27/14 1539 07/27/14 1545  NA 137 138  K 4.1 4.0  CL 105 102  CO2 22  --   BUN 18 17  CREATININE 0.86 0.90  CALCIUM 9.1  --   PROT 7.0  --   BILITOT 0.7  --   ALKPHOS 75  --   ALT 32  --   AST 28  --   GLUCOSE 96 98  ALBUMIN 4.3  --    No results found for: MG  Recent Labs  07/27/14 1545  TROPIPOC 0.02   No results found for: PROBNP Lab Results  Component Value Date   CHOL 185 07/28/2014   HDL 32* 07/28/2014   LDLCALC 129* 07/28/2014   TRIG 119 07/28/2014    Echo: Study Date: 07/29/2014 LV EF: 40-45% Study Conclusions - Left ventricle: The cavity size was normal. Wall thickness was   increased in a pattern of mild LVH. Systolic function was mildly   to moderately reduced. The estimated ejection fraction was in the   range of 40% to 45%. Doppler parameters are consistent with   abnormal left ventricular relaxation (grade 1 diastolic   dysfunction). - Left atrium: The atrium was mildly dilated.   ECG:  NSR with LVH  Radiology:  No results found.  ASSESSMENT AND PLAN:    Active Problems:   CVA (cerebral infarction)   Stroke   Tobacco abuse   Dyslipidemia   Daniel [name] is a 64 y.o. male with no significant PMH who presented to [facility] on 07/27/14 with sudden onset of numbness in the left side including hand as well as leg and dizziness. CT revealed subacute right frontotemporal infarct and  calcification within the right MCA. 2D ECHO was performed which revealed EF 40-45%.   Newly diagnosed cardiomyopathy- EF 40-45%. No s/s CHF. -- POC troponin negative  -- He does have RFs for CAD with tobacco abuse, HTN, HLD, family hx. He also has some exertional chest pain and SOB while doing yard work starting last month.  -- He will likely need a LHC but timing of this will depend on when neurology  feels like this is safe after a recent stroke and possible need for DAPT if intervention is necessary   Acute CVA/right frontotemporal infarcts secondary to MCA M3 branch emboli possibly from right ICA plaque - CT head: Acute to subacute right frontoparietal infarct with calcification/clot within the right MCA branch - MRI brain: Moderately large area of nonhemorrhagic acute infarction affecting the right hemisphere, small calcific embolus in M 3 branch of the right MCA - CTA head and neck confirms a right MCA embolic infarct, right MCA M3 calcific embolus and right ICA plaque - Hemoglobin A1c: 5.3 - Lipid panel: LDL 129-started statins. - Neurology follow-up appreciated. - Currently on aspirin 325 mg daily. Not on antithrombotic prior to admission. - Will allow for permissive hypertension - Neurology recommends TEE to look for embolic source (scheduled for 07/31/14). If positive will need bilateral lower extremity Dopplers. If negative, needs loop recorder placed.  Hyperlipidemia - Lipid panel: Total cholesterol 95, triglycerides 119, HDL 32, LDL 129 - Started statin  Tobacco abuse - Patient counseled on the need for nicotine and tobacco cessation  Essential hypertension - Newly diagnosed. Allow permissive hypertension due to recent acute stroke.     SignedCrista Luria 07/30/2014 12:57 PM  Pager [phone]  Co-Sign MD  I have examined the patient and reviewed assessment and plan and discussed with patient.  Agree with above as stated.  Patient to have TEE tomorrow.   Given his knowm atherosclerosis and exertional angina and SHOB prior to admission, would plan on cardiac cath to evaluate lower EF.  I suspect that he may not be a candidate for dual antiplatelet therapy immediately.  Would check with neurology as to when he would be a candidate for anticoagulation.  At that point, we could schedule outpatient cathy with possible PCI.  The risks and benefits of the procedure were explained to the patient. He is agreeable.  We tried to speak to his wife but she did not answer the phone.  i will speak to her later.  LDL above target.  He would benefit from a statin with LDL target < 100. He needs to stop smoking. HTN, aloowing HTN at this point.    Daniel Abbett S.

## 2014-07-30 NOTE — Progress Notes (Signed)
[facility]                                                                              Patient Demographics  Daniel [name], is a 64 y.o. male, DOB - 1950-09-18, NOM:[id number]  Admit date - 07/27/2014   Admitting Physician Robbie Lis, MD  Outpatient Primary MD for the patient is No primary care provider on file.  LOS - 3   Chief Complaint  Patient presents with  . Numbness    left arm and leg   Interim history     64 year old male with no significant past medical history presented with sudden onset of numbness in the left side including hand as well as leg. Patient had dizziness and felt he could not maintain his balance. 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 Patient was transferred to [facility], neurology was consulted. Pending further workup.  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 - Neurology follow-up appreciated. - PT and OT consultation appreciated-outpatient therapies.  - Currently on aspirin 325 mg daily. Not on antithrombotic prior to admission. - Will allow for permissive hypertension - Neurology recommends TEE to look for embolic source (scheduled for 07/31/14). If positive will need bilateral lower extremity Dopplers. If TEE negative, may need loop  recorder placed.  Hyperlipidemia -Lipid panel: Total cholesterol 95, triglycerides 119, HDL 32, LDL 129 -Started statin  Tobacco abuse -Patient counseled on tobacco cessation - declined Nicotine patch  Essential hypertension -Newly diagnosed. Allowed permissive hypertension due to recent acute stroke.  - Will start low dose ACEI (will help with cardiomyopathy too)  Cardiomyopathy - No reported CP or cardiac symptoms.  - Strong FH of heart disease per patient & spouse. - Cardiology consulted re need for ischemic evaluation. - Start low dose ACEI and titrate.  Code Status: Full Family Communication: Discussed with spouse at bedside.  Disposition Plan: Admitted, pending further workup  Time Spent in minutes   30 minutes  Procedures  None  Consults   Neurology Cardiology - pending  DVT Prophylaxis  SCDs  Lab Results  Component Value Date   PLT 224 07/27/2014    Medications  Scheduled Meds: . aspirin EC  325 mg Oral Daily  . atorvastatin  20 mg Oral q1800   Continuous Infusions:  PRN Meds:.acetaminophen, hydrALAZINE, senna-docusate  Antibiotics    Anti-infectives    None      Subjective:   Daniel [name] states that LUE weakness and numbness and slightly better than yesterday. Denies any other complaints.  Objective:   Filed Vitals:  07/29/14 2156 07/30/14 0200 07/30/14 0610 07/30/14 1008  BP: 190/76 161/68 183/82 177/70  Pulse: 85 64 64 55  Temp: 97.7 F (36.5 C) 97.8 F (36.6 C) 97.5 F (36.4 C) 98.5 F (36.9 C)  TempSrc: Oral Oral Oral Oral  Resp: 18 18 18 18   Height:      Weight:      SpO2: 97% 96% 99% 98%    Wt Readings from Last 3 Encounters:  07/28/14 87.54 kg (192 lb 15.9 oz)  07/27/14 87.544 kg (193 lb)  08/31/12 87.544 kg (193 lb)    No intake or output data in the 24 hours ending 07/30/14 1326  Exam  General: Well developed, well nourished, NAD, appears stated age. Sitting up comfortably in bed.  Cardiovascular: S1 S2  auscultated, no rubs, murmurs or gallops. Regular rate and rhythm.Telemetry: Sinus rhythm.   Respiratory: Clear to auscultation bilaterally. No increased work of breathing.  Abdomen: Soft, nontender, nondistended, + bowel sounds  Extremities: Right extremity is grade 5 x 5 power. Left extremities grade 4+/5 power. Decreased sensation over the medial aspect of left forearm and hand  Neuro: AAOx3, cranial nerves grossly intact.    Data Review   Micro Results No results found for this or any previous visit (from the past 240 hour(s)).  Radiology Reports Ct Angio Head W/cm &/or Wo Cm  07/28/2014   CLINICAL DATA:  Sudden onset LEFT-sided weakness beginning 07/17/2014. Initial encounter.  EXAM: CT ANGIOGRAPHY HEAD AND NECK  TECHNIQUE: Multidetector CT imaging of the head and neck was performed using the standard protocol during bolus administration of intravenous contrast. Multiplanar CT image reconstructions and MIPs were obtained to evaluate the vascular anatomy. Carotid stenosis measurements (when applicable) are obtained utilizing NASCET criteria, using the distal internal carotid diameter as the denominator.  CONTRAST:  32mL OMNIPAQUE IOHEXOL 350 MG/ML SOLN  COMPARISON:  CT head 07/27/2014.  MR head 07/27/2014.  FINDINGS: CT HEAD  Calvarium and skull base: No fracture or destructive lesion. Mastoids and middle ears are grossly clear.  Paranasal sinuses: Imaged portions are clear, except for moderate paranasal sinus disease RIGHT maxillary region.  Orbits: Negative.  Brain: No hemorrhage or neoplasm. Normal cerebral volume. Calcific embolus to a posterior sylvian M3 branch with developing cytotoxic edema throughout posterior frontal and temporal lobes as well as the adjacent white matter.  CTA NECK  Aortic arch: Standard branching. Imaged portion shows no evidence of aneurysm or dissection. No significant stenosis of the major arch vessel origins. Moderate calcific and soft plaque at the innominate  origin and LEFT common carotid origin.  Right carotid system: There is extensive soft plaque and to a lesser degree calcific plaque at the RIGHT ICA origin extending for length of 2 cm cephalad into the proximal RIGHT internal carotid. Stenosis measurements of 2.3 mm proximal and 5.0 mm distal yield a slightly greater than 50% stenosis, non flow reducing, but the irregular appearance of the soft plaque strongly suggests that this vessel could represent the embolic source.  Left carotid system: Moderate calcific and soft plaque without flow reducing stenosis. Luminal diameter of 2.9 mm. Proximal and 4.6 mm distal calculates to less than 50% stenosis. No evidence of dissection, stenosis (50% or greater) or occlusion.  Vertebral arteries: LEFT minimally larger. No evidence of dissection, stenosis (50% or greater) or occlusion.  Nonvascular structures: No adenopathy. Airway midline. Lung apices show no mass or pneumothorax. No worrisome osseous lesions. Cervical spondylosis. Normal thyroid.  CTA HEAD  Anterior circulation: 1 x 4  mm calcific embolus to a RIGHT MCA M3 branch in the sylvian fissure. Distal to this, there is cytotoxic edema with absent perfusion and diminished enhancement. No large vessel occlusion of the proximal RIGHT or LEFT ICA or RIGHT or LEFT MCA M1 segment. Normal anterior cerebrals. No significant stenosis, proximal occlusion, aneurysm, or vascular malformation.  Posterior circulation: No significant stenosis, proximal occlusion, aneurysm, or vascular malformation.  Venous sinuses: As permitted by contrast timing, patent.  Anatomic variants: None of significance.  Delayed phase: No abnormal intracranial enhancement. No luxury perfusion has developed at this time at the site of infarction.  IMPRESSION: Re-demonstration of acute RIGHT frontotemporal infarction due to a calcific embolus to a RIGHT MCA M3 branch.  Moderately irregular and extensive mixed soft plaque and calcific plaque at the RIGHT  internal carotid artery origin likely represents the embolic source. Slightly greater than 50% stenosis is non flow reducing. See discussion above.   Electronically Signed   By: Rolla Flatten M.D.   On: 07/28/2014 11:24   Dg Chest 2 View  07/27/2014   CLINICAL DATA:  Left CVA.  EXAM: CHEST  2 VIEW  COMPARISON:  08/31/2012  FINDINGS: The cardiomediastinal silhouette is unremarkable.  There is no evidence of focal airspace disease, pulmonary edema, suspicious pulmonary nodule/mass, pleural effusion, or pneumothorax. No acute bony abnormalities are identified.  IMPRESSION: No active cardiopulmonary disease.   Electronically Signed   By: Margarette Canada M.D.   On: 07/27/2014 16:43   Ct Head Wo Contrast  07/27/2014   CLINICAL DATA:  64 year old male with acute confusion, left hand and left leg weakness for 1 day. Initial encounter.  EXAM: CT HEAD WITHOUT CONTRAST  TECHNIQUE: Contiguous axial images were obtained from the base of the skull through the vertex without intravenous contrast.  COMPARISON:  None.  FINDINGS: Hypodensity within the right frontoparietal region involves the cortex and compatible with an acute-subacute infarct. A calcification/clot within the right MCA branch supplying this region is noted. There is no evidence of hemorrhage.  No other intracranial abnormalities are identified, including mass lesion, hydrocephalus, extra-axial fluid collection, or midline shift.  Chronic right maxillary sinus disease noted.  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 No evidence of hemorrhage.  Chronic right maxillary sinusitis.  Critical Value/emergent results were called by telephone at the time of interpretation on 07/27/2014 at 4:31 pm to Dr. Shirlyn Goltz , who verbally acknowledged these results.   Electronically Signed   By: Margarette Canada M.D.   On: 07/27/2014 16:31   Ct Angio Neck W/cm &/or Wo/cm  07/28/2014   CLINICAL  DATA:  Sudden onset LEFT-sided weakness beginning 07/17/2014. Initial encounter.  EXAM: CT ANGIOGRAPHY HEAD AND NECK  TECHNIQUE: Multidetector CT imaging of the head and neck was performed using the standard protocol during bolus administration of intravenous contrast. Multiplanar CT image reconstructions and MIPs were obtained to evaluate the vascular anatomy. Carotid stenosis measurements (when applicable) are obtained utilizing NASCET criteria, using the distal internal carotid diameter as the denominator.  CONTRAST:  108mL OMNIPAQUE IOHEXOL 350 MG/ML SOLN  COMPARISON:  CT head 07/27/2014.  MR head 07/27/2014.  FINDINGS: CT HEAD  Calvarium and skull base: No fracture or destructive lesion. Mastoids and middle ears are grossly clear.  Paranasal sinuses: Imaged portions are clear, except for moderate paranasal sinus disease RIGHT maxillary region.  Orbits: Negative.  Brain: No hemorrhage or neoplasm. Normal cerebral volume. Calcific embolus to a posterior sylvian M3  branch with developing cytotoxic edema throughout posterior frontal and temporal lobes as well as the adjacent white matter.  CTA NECK  Aortic arch: Standard branching. Imaged portion shows no evidence of aneurysm or dissection. No significant stenosis of the major arch vessel origins. Moderate calcific and soft plaque at the innominate origin and LEFT common carotid origin.  Right carotid system: There is extensive soft plaque and to a lesser degree calcific plaque at the RIGHT ICA origin extending for length of 2 cm cephalad into the proximal RIGHT internal carotid. Stenosis measurements of 2.3 mm proximal and 5.0 mm distal yield a slightly greater than 50% stenosis, non flow reducing, but the irregular appearance of the soft plaque strongly suggests that this vessel could represent the embolic source.  Left carotid system: Moderate calcific and soft plaque without flow reducing stenosis. Luminal diameter of 2.9 mm. Proximal and 4.6 mm distal  calculates to less than 50% stenosis. No evidence of dissection, stenosis (50% or greater) or occlusion.  Vertebral arteries: LEFT minimally larger. No evidence of dissection, stenosis (50% or greater) or occlusion.  Nonvascular structures: No adenopathy. Airway midline. Lung apices show no mass or pneumothorax. No worrisome osseous lesions. Cervical spondylosis. Normal thyroid.  CTA HEAD  Anterior circulation: 1 x 4 mm calcific embolus to a RIGHT MCA M3 branch in the sylvian fissure. Distal to this, there is cytotoxic edema with absent perfusion and diminished enhancement. No large vessel occlusion of the proximal RIGHT or LEFT ICA or RIGHT or LEFT MCA M1 segment. Normal anterior cerebrals. No significant stenosis, proximal occlusion, aneurysm, or vascular malformation.  Posterior circulation: No significant stenosis, proximal occlusion, aneurysm, or vascular malformation.  Venous sinuses: As permitted by contrast timing, patent.  Anatomic variants: None of significance.  Delayed phase: No abnormal intracranial enhancement. No luxury perfusion has developed at this time at the site of infarction.  IMPRESSION: Re-demonstration of acute RIGHT frontotemporal infarction due to a calcific embolus to a RIGHT MCA M3 branch.  Moderately irregular and extensive mixed soft plaque and calcific plaque at the RIGHT internal carotid artery origin likely represents the embolic source. Slightly greater than 50% stenosis is non flow reducing. See discussion above.   Electronically Signed   By: Rolla Flatten M.D.   On: 07/28/2014 11:24   Mr Brain Wo Contrast  07/27/2014   CLINICAL DATA:  64 yo male with acute confusion, LEFT hand and LEFT leg weakness for 1 day. Initial encounter. Stroke risk factors include hypertension.  EXAM: MRI HEAD WITHOUT CONTRAST  TECHNIQUE: Multiplanar, multiecho pulse sequences of the brain and surrounding structures were obtained without intravenous contrast.  COMPARISON:  CT head earlier today.   FINDINGS: Moderately large area of restricted diffusion affecting the RIGHT insula, and posterior limb internal capsule, along with the posterior frontal and temporal lobes representing acute infarction due to RIGHT MCA M3 branch occlusion. Small calcific embolus, responsible for the infarction, is lodged in the posterior aspect RIGHT sylvian fissure (image 14 series 8) as identified more readily on CT.  No acute hemorrhage, mass lesion, hydrocephalus, or extra-axial fluid.  No proximal large vessel occlusion of the internal carotid, middle cerebral M1, or M2 segments. Basilar artery and both vertebral arteries display patent flow voids.  Normal cerebral volume. Mild subcortical and periventricular T2 and FLAIR hyperintensities, likely chronic microvascular ischemic change. Flow voids are maintained in the internal carotid, basilar, and both vertebral arteries. Pituitary, pineal, and cerebellar tonsils unremarkable. No upper cervical lesions. Calvarium intact. Chronic sinus disease in the  RIGHT maxillary and RIGHT frontal sinuses without air-fluid level. Negative orbits. No mastoid fluid.  IMPRESSION: Moderately large area of nonhemorrhagic acute infarction affecting the RIGHT hemisphere as described, responsible for LEFT-sided weakness.  Small calcific embolus to an M3 branch of the RIGHT middle cerebral artery is responsible.  Mild small vessel disease.  Chronic sinusitis.   Electronically Signed   By: Rolla Flatten M.D.   On: 07/27/2014 18:21    CBC  Recent Labs Lab 07/27/14 1539 07/27/14 1545  WBC 9.7  --   HGB 16.2 17.0  HCT 46.8 50.0  PLT 224  --   MCV 90.0  --   MCH 31.2  --   MCHC 34.6  --   RDW 13.0  --   LYMPHSABS 2.6  --   MONOABS 0.7  --   EOSABS 0.1  --   BASOSABS 0.1  --     Chemistries   Recent Labs Lab 07/27/14 1539 07/27/14 1545  NA 137 138  K 4.1 4.0  CL 105 102  CO2 22  --   GLUCOSE 96 98  BUN 18 17  CREATININE 0.86 0.90  CALCIUM 9.1  --   AST 28  --   ALT 32   --   ALKPHOS 75  --   BILITOT 0.7  --    ------------------------------------------------------------------------------------------------------------------ estimated creatinine clearance is 89.5 mL/min (by C-G formula based on Cr of 0.9). ------------------------------------------------------------------------------------------------------------------  Recent Labs  07/28/14 0744  HGBA1C 5.3   ------------------------------------------------------------------------------------------------------------------  Recent Labs  07/28/14 0744  CHOL 185  HDL 32*  LDLCALC 129*  TRIG 119  CHOLHDL 5.8   ------------------------------------------------------------------------------------------------------------------ No results for input(s): TSH, T4TOTAL, T3FREE, THYROIDAB in the last 72 hours.  Invalid input(s): FREET3 ------------------------------------------------------------------------------------------------------------------ No results for input(s): VITAMINB12, FOLATE, FERRITIN, TIBC, IRON, RETICCTPCT in the last 72 hours.  Coagulation profile  Recent Labs Lab 07/27/14 1539  INR 0.97    No results for input(s): DDIMER in the last 72 hours.  Cardiac Enzymes No results for input(s): CKMB, TROPONINI, MYOGLOBIN in the last 168 hours.  Invalid input(s): CK ------------------------------------------------------------------------------------------------------------------ Invalid input(s): POCBNP   Time spent: 30 minutes.   Vernell Leep, MD, FACP, FHM. [facility] Pager [phone]  If 7PM-7AM, please contact night-coverage [email or web address] Password [email or web address] 07/30/2014, 1:26 PM

## 2014-07-30 NOTE — Progress Notes (Addendum)
Occupational Therapy Treatment Patient Details Name: Daniel [name] MRN: [id number] DOB: Feb 03, 1951 Today's Date: 07/30/2014    History of present illness 64 year old male with no significant past medical history presented with sudden onset of numbness in the left side including hand as well as leg. Patient had dizziness and felt he could not maintain his balance. 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    OT comments  Patient progressing towards goals, continue plan of care for now. Patient continues to be limited by decreased executive functioning abilities. Patient with poor attention which contributes to decreased problem solving and awareness during functional, every day tasks. Patient very easily distracted and does much better in a controlled environment. Patient was able to find way back to room after walking around [facility] unit at a supervision level (Patient seems better with topographical orientation as compared to yesterday). Highly recommend OPOT to focus on higher level cognitive tasks for patient's overall safety in his functional context. Upon discharge, also recommend 24/7 supervision/assistance. Patient states his wife can take FMLA to accommodate this recommendation.    Follow Up Recommendations  Outpatient OT;Supervision/Assistance - 24 hour    Equipment Recommendations  None recommended by OT    Recommendations for Other Services  None at this time   Precautions / Restrictions Precautions Precautions: Fall Restrictions Weight Bearing Restrictions: No       Mobility Bed Mobility  Patient seated in recliner upon OT entering room  Transfers Overall transfer level: Needs assistance Equipment used: None Transfers: Sit to/from Stand Sit to Stand: Supervision         General transfer comment: patient unsteady during initial stand from recliner, patient able to correct  balance and keep from falling    Balance Overall balance assessment: Needs assistance Sitting-balance support: No upper extremity supported;Feet supported Sitting balance-Leahy Scale: Normal     Standing balance support: No upper extremity supported;During functional activity Standing balance-Leahy Scale: Fair     ADL General ADL Comments: Patient overall supervision>mod I for ADLs. Patient requires more than a reasonable amount of time to complete tasks and requires controlled environment secondary to distractibility.      Cognition   Behavior During Therapy: Impulsive;Agitated Overall Cognitive Status: Impaired/Different from baseline Area of Impairment: Attention;Awareness;Safety/judgement;Problem solving   Current Attention Level: Selective      Safety/Judgement: Decreased awareness of safety;Decreased awareness of deficits Awareness: Emergent Problem Solving: Difficulty sequencing General Comments: Patient with decreased attention in high traffic/volume situations. Recommend patient perform executive functioning tasks in a controlled environment. Patient's decreased attention affects his ability to problem solve and carry out everyday tasks. Patient interrupted therapist and PA who was in room multiple times during conversation. Discussed this with patient and need for continued occupational therapy in an outpatient setting.                  Pertinent Vitals/ Pain       Pain Assessment: No/denies pain         Frequency Min 2X/week     Progress Toward Goals  OT Goals(current goals can now be found in the care plan section)  Progress towards OT goals: Progressing toward goals     Plan Discharge plan remains appropriate          Activity Tolerance Patient tolerated treatment well;Treatment limited secondary to agitation   Patient Left in chair;with call bell/phone within reach     Time:  5003-7048 OT Time Calculation (min): 37 min  Charges: OT General  Charges $OT Visit: 1 Procedure OT Treatments $Therapeutic Activity: 23-37 mins  Rockie Vawter , MS, OTR/L, CLT Pager: [phone]  07/30/2014, 2:11 PM

## 2014-07-30 NOTE — Progress Notes (Signed)
STROKE TEAM PROGRESS NOTE   HISTORY Daniel [name] is a RH 64 y.o. male who reports that on Friday he was working in the yard and was at baseline. When he went into the house he noted while in the kitchen acute onset left hand numbness (LKW 07/26/2014 1300). This progressed to involve the arm and then the LLE. He became off balance and was walking into walls. He felt as if he was drunk. Noted some twitching of the left eye as well for a time. He did not seek medical attention at that time. Saturday 3/19/016 felt as if he was getting some RLE numbness as well and presented for evaluation.  Patient was not administered TPA secondary to delay in arrival. He was admitted for further evaluation and treatment.   SUBJECTIVE (INTERVAL HISTORY) Pt up in the chair at the bedside. No family/friends present.   OBJECTIVE Temp:  [97.5 F (36.4 C)-98.5 F (36.9 C)] 98 F (36.7 C) (03/22 1421) Pulse Rate:  [55-85] 63 (03/22 1421) Cardiac Rhythm:  [-]  Resp:  [18] 18 (03/22 1421) BP: (161-196)/(68-95) 191/87 mmHg (03/22 1421) SpO2:  [96 %-99 %] 99 % (03/22 1421)   Recent Labs Lab 07/28/14 0622  GLUCAP 101*    Recent Labs Lab 07/27/14 1539 07/27/14 1545  NA 137 138  K 4.1 4.0  CL 105 102  CO2 22  --   GLUCOSE 96 98  BUN 18 17  CREATININE 0.86 0.90  CALCIUM 9.1  --     Recent Labs Lab 07/27/14 1539  AST 28  ALT 32  ALKPHOS 75  BILITOT 0.7  PROT 7.0  ALBUMIN 4.3    Recent Labs Lab 07/27/14 1539 07/27/14 1545  WBC 9.7  --   NEUTROABS 6.2  --   HGB 16.2 17.0  HCT 46.8 50.0  MCV 90.0  --   PLT 224  --    No results for input(s): CKTOTAL, CKMB, CKMBINDEX, TROPONINI in the last 168 hours.  Recent Labs  07/27/14 1539  LABPROT 13.0  INR 0.97    Recent Labs  07/27/14 1827  COLORURINE YELLOW  LABSPEC 1.029  PHURINE 6.0  GLUCOSEU NEGATIVE  HGBUR NEGATIVE  BILIRUBINUR NEGATIVE  KETONESUR NEGATIVE  PROTEINUR NEGATIVE  UROBILINOGEN 1.0  NITRITE NEGATIVE   LEUKOCYTESUR SMALL*       Component Value Date/Time   CHOL 185 07/28/2014 0744   TRIG 119 07/28/2014 0744   HDL 32* 07/28/2014 0744   CHOLHDL 5.8 07/28/2014 0744   VLDL 24 07/28/2014 0744   LDLCALC 129* 07/28/2014 0744   Lab Results  Component Value Date   HGBA1C 5.3 07/28/2014      Component Value Date/Time   LABOPIA NONE DETECTED 07/27/2014 1827   COCAINSCRNUR NONE DETECTED 07/27/2014 1827   LABBENZ NONE DETECTED 07/27/2014 1827   AMPHETMU NONE DETECTED 07/27/2014 1827   THCU NONE DETECTED 07/27/2014 1827   LABBARB NONE DETECTED 07/27/2014 1827     Recent Labs Lab 07/27/14 1518  ETH <5    No results found.  2D Echocardiogram  EF 40-45% with no source of embolus.    PHYSICAL EXAM Pleasant obese caucasian middle aged male not in distress. . Afebrile. Head is nontraumatic. Neck is supple without bruit.    Cardiac exam no murmur or gallop. Lungs are clear to auscultation. Distal pulses are well felt. Neurological Exam :  Awake alert oriented x 3 normal speech and language. Mild left lower face asymmetry. Tongue midline. No drift. Mild diminished fine finger  movements on left. Orbits right over left upper extremity. Mild left grip weak.. Normal sensation . Normal coordination.   ASSESSMENT/PLAN Daniel [name] is a 64 y.o. male with no significant past medical history  presenting with feeling off balance, left arm and leg numbness. He did not receive IV t-PA due to delay in arrival.   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 Arranged with Felida for Wednesday (schedule full for Tues when  I called).  If positive for PFO (patent foramen ovale), check bilateral lower extremity venous dopplers to rule out DVT as possible source of stroke. (I have made patient NPO after midnight Tues night).   If TEE negative, a [place] electrophysiologist will consult and consider placement of an implantable loop recorder to evaluate for atrial fibrillation as etiology of stroke. This has been explained to patient/family by Dr. Leonie Man and he is agreeable.   HgbA1c 5.3  SCDs for VTE prophylaxis Diet Heart  Diet NPO time specified   no antithrombotic prior to admission, now on aspirin 325 mg orally every day  Patient counseled to be compliant with his antithrombotic medications  Ongoing aggressive stroke risk factor management  Therapy recommendations:  CIR. Consult in place. Too good for CIR per Dr. Letta Pate.   Disposition:  Return home, OP therapy   In the event he is discharged, Follow-up Stroke Clinic at [facility] with Dr. Antony Contras in 2 months, order placed.   Hypertension  No hisotry, on no home meds  Elevated  New lisinopril  Hyperlipidemia  Home meds:  No statin  LDL 129, goal < 70  Add statin, lipitor 20  Continue statin at discharge  Other Stroke Risk Factors  Cigarette smoker, advised to stop smoking  Hospital day # [place] for Pager information 07/30/2014 3:06 PM  I have personally examined this patient, reviewed notes, independently viewed imaging studies, participated in medical decision making and plan of care. I have made any additions or clarifications directly to the above note. Agree with note above. Await TEE and loop recorder placement tomorrow.  Antony Contras, MD Medical Director [facility] Pager: [phone] 07/30/2014 3:06 PM   To contact Stroke Continuity provider, please refer to [email or web address] After hours, contact General Neurology

## 2014-07-31 ENCOUNTER — Encounter (HOSPITAL_COMMUNITY)
Admission: EM | Disposition: A | Discharge status: Home / Self Care | Payer: Self-pay | Source: Home / Self Care | Attending: Internal Medicine

## 2014-07-31 ENCOUNTER — Encounter (HOSPITAL_COMMUNITY): Payer: Self-pay | Admitting: *Deleted

## 2014-07-31 DIAGNOSIS — I63031 Cerebral infarction due to thrombosis of right carotid artery: Secondary | ICD-10-CM

## 2014-07-31 DIAGNOSIS — I639 Cerebral infarction, unspecified: Secondary | ICD-10-CM

## 2014-07-31 DIAGNOSIS — R072 Precordial pain: Secondary | ICD-10-CM

## 2014-07-31 HISTORY — PX: LOOP RECORDER IMPLANT: SHX5477

## 2014-07-31 HISTORY — PX: TEE WITHOUT CARDIOVERSION: SHX5443

## 2014-07-31 SURGERY — ECHOCARDIOGRAM, TRANSESOPHAGEAL
Anesthesia: Moderate Sedation

## 2014-07-31 SURGERY — LOOP RECORDER IMPLANT
Anesthesia: LOCAL

## 2014-07-31 MED ORDER — LISINOPRIL 5 MG PO TABS: 5.0000 mg | ORAL_TABLET | Freq: Every day | ORAL | Status: DC

## 2014-07-31 MED ORDER — BUTAMBEN-TETRACAINE-BENZOCAINE 2-2-14 % EX AERO
INHALATION_SPRAY | CUTANEOUS | Status: DC | PRN
  Administered 2014-07-31: 2 via TOPICAL

## 2014-07-31 MED ORDER — LISINOPRIL 20 MG PO TABS: 20.0000 mg | ORAL_TABLET | Freq: Every day | ORAL | Status: DC

## 2014-07-31 MED ORDER — MIDAZOLAM HCL 5 MG/ML IJ SOLN
INTRAMUSCULAR | Status: AC
  Filled 2014-07-31: qty 2

## 2014-07-31 MED ORDER — FENTANYL CITRATE 0.05 MG/ML IJ SOLN
INTRAMUSCULAR | Status: AC
Start: 2014-07-31 — End: 2014-07-31
  Filled 2014-07-31: qty 2

## 2014-07-31 MED ORDER — FENTANYL CITRATE 0.05 MG/ML IJ SOLN
INTRAMUSCULAR | Status: DC | PRN
  Administered 2014-07-31: 50 ug via INTRAVENOUS
  Administered 2014-07-31: 25 ug via INTRAVENOUS

## 2014-07-31 MED ORDER — ASPIRIN 325 MG PO TBEC: 325.0000 mg | DELAYED_RELEASE_TABLET | Freq: Every day | ORAL | Status: DC

## 2014-07-31 MED ORDER — LIDOCAINE-EPINEPHRINE 1 %-1:100000 IJ SOLN
INTRAMUSCULAR | Status: AC
  Filled 2014-07-31: qty 1

## 2014-07-31 MED ORDER — ATORVASTATIN CALCIUM 20 MG PO TABS: 20.0000 mg | ORAL_TABLET | Freq: Every day | ORAL | Status: DC

## 2014-07-31 MED ORDER — SODIUM CHLORIDE 0.9 % IV SOLN: INTRAVENOUS | Status: DC

## 2014-07-31 MED ORDER — MIDAZOLAM HCL 10 MG/2ML IJ SOLN
INTRAMUSCULAR | Status: DC | PRN
  Administered 2014-07-31: 1 mg via INTRAVENOUS
  Administered 2014-07-31 (×3): 2 mg via INTRAVENOUS

## 2014-07-31 NOTE — Progress Notes (Signed)
PT Cancellation Note  Patient Details Name: Daniel [name] MRN: [id number] DOB: 03/21/1951   Cancelled Treatment:    Reason Eval/Treat Not Completed: Medical issues which prohibited therapy. Patient at TEE. Will follow up later today   Jacqualyn Posey 07/31/2014, 10:09 AM

## 2014-07-31 NOTE — Progress Notes (Signed)
Pt viewed video for loop recorder education with wife. All DIscharge instructions reviewed and both verbalized understanding. Pt escorted to front lobby via wheelchair at this time and wife provided transportation home.

## 2014-07-31 NOTE — Discharge Summary (Signed)
PATIENT DETAILS Name: SENICA CRALL Age: 64 y.o. Sex: male Date of Birth: Jan 05, 1951 MRN: [id number]. Admitting Physician: Robbie Lis, MD PCP: Melinda Crutch, MD  Admit Date: 07/27/2014 Discharge date: 07/31/2014  Recommendations for Outpatient Follow-up:  1. Please check CBC and chemistries in 1 week 2. Please check lipid panel in 3 months  PRIMARY DISCHARGE DIAGNOSIS:  Active Problems:   CVA (cerebral infarction)   Stroke   Tobacco abuse   Dyslipidemia   Precordial pain      PAST MEDICAL HISTORY: Past Medical History  Diagnosis Date  . Essential hypertension   . CVA (cerebral infarction)     DISCHARGE MEDICATIONS: Current Discharge Medication List    START taking these medications   Details  aspirin EC 325 MG EC tablet Take 1 tablet (325 mg total) by mouth daily. Qty: 30 tablet, Refills: 0    atorvastatin (LIPITOR) 20 MG tablet Take 1 tablet (20 mg total) by mouth daily at 6 PM. Qty: 30 tablet, Refills: 0    lisinopril (PRINIVIL,ZESTRIL) 20 MG tablet Take 1 tablet (20 mg total) by mouth daily. Qty: 30 tablet, Refills: 0      STOP taking these medications     Aspirin-Acetaminophen-Caffeine 500-325-65 MG PACK      ibuprofen (ADVIL,MOTRIN) 200 MG tablet      Multiple Vitamin (MULTIVITAMIN WITH MINERALS) TABS tablet      naproxen sodium (ANAPROX) 220 MG tablet      azithromycin (ZITHROMAX) 250 MG tablet         ALLERGIES:  No Known Allergies  BRIEF HPI:  See H&P, Labs, Consult and Test reports for all details in brief, patient is a 64 year old male who was admitted with sudden onset of left sided numbness. Further evaluation revealed CVA.  CONSULTATIONS:   cardiology and neurology  PERTINENT RADIOLOGIC STUDIES: Ct Angio Head W/cm &/or Wo Cm  07/28/2014   CLINICAL DATA:  Sudden onset LEFT-sided weakness beginning 07/17/2014. Initial encounter.  EXAM: CT ANGIOGRAPHY HEAD AND NECK  TECHNIQUE: Multidetector CT imaging of the head and neck was  performed using the standard protocol during bolus administration of intravenous contrast. Multiplanar CT image reconstructions and MIPs were obtained to evaluate the vascular anatomy. Carotid stenosis measurements (when applicable) are obtained utilizing NASCET criteria, using the distal internal carotid diameter as the denominator.  CONTRAST:  22mL OMNIPAQUE IOHEXOL 350 MG/ML SOLN  COMPARISON:  CT head 07/27/2014.  MR head 07/27/2014.  FINDINGS: CT HEAD  Calvarium and skull base: No fracture or destructive lesion. Mastoids and middle ears are grossly clear.  Paranasal sinuses: Imaged portions are clear, except for moderate paranasal sinus disease RIGHT maxillary region.  Orbits: Negative.  Brain: No hemorrhage or neoplasm. Normal cerebral volume. Calcific embolus to a posterior sylvian M3 branch with developing cytotoxic edema throughout posterior frontal and temporal lobes as well as the adjacent white matter.  CTA NECK  Aortic arch: Standard branching. Imaged portion shows no evidence of aneurysm or dissection. No significant stenosis of the major arch vessel origins. Moderate calcific and soft plaque at the innominate origin and LEFT common carotid origin.  Right carotid system: There is extensive soft plaque and to a lesser degree calcific plaque at the RIGHT ICA origin extending for length of 2 cm cephalad into the proximal RIGHT internal carotid. Stenosis measurements of 2.3 mm proximal and 5.0 mm distal yield a slightly greater than 50% stenosis, non flow reducing, but the irregular appearance of the soft plaque strongly suggests that this vessel could  represent the embolic source.  Left carotid system: Moderate calcific and soft plaque without flow reducing stenosis. Luminal diameter of 2.9 mm. Proximal and 4.6 mm distal calculates to less than 50% stenosis. No evidence of dissection, stenosis (50% or greater) or occlusion.  Vertebral arteries: LEFT minimally larger. No evidence of dissection, stenosis  (50% or greater) or occlusion.  Nonvascular structures: No adenopathy. Airway midline. Lung apices show no mass or pneumothorax. No worrisome osseous lesions. Cervical spondylosis. Normal thyroid.  CTA HEAD  Anterior circulation: 1 x 4 mm calcific embolus to a RIGHT MCA M3 branch in the sylvian fissure. Distal to this, there is cytotoxic edema with absent perfusion and diminished enhancement. No large vessel occlusion of the proximal RIGHT or LEFT ICA or RIGHT or LEFT MCA M1 segment. Normal anterior cerebrals. No significant stenosis, proximal occlusion, aneurysm, or vascular malformation.  Posterior circulation: No significant stenosis, proximal occlusion, aneurysm, or vascular malformation.  Venous sinuses: As permitted by contrast timing, patent.  Anatomic variants: None of significance.  Delayed phase: No abnormal intracranial enhancement. No luxury perfusion has developed at this time at the site of infarction.  IMPRESSION: Re-demonstration of acute RIGHT frontotemporal infarction due to a calcific embolus to a RIGHT MCA M3 branch.  Moderately irregular and extensive mixed soft plaque and calcific plaque at the RIGHT internal carotid artery origin likely represents the embolic source. Slightly greater than 50% stenosis is non flow reducing. See discussion above.   Electronically Signed   By: Rolla Flatten M.D.   On: 07/28/2014 11:24   Dg Chest 2 View  07/27/2014   CLINICAL DATA:  Left CVA.  EXAM: CHEST  2 VIEW  COMPARISON:  08/31/2012  FINDINGS: The cardiomediastinal silhouette is unremarkable.  There is no evidence of focal airspace disease, pulmonary edema, suspicious pulmonary nodule/mass, pleural effusion, or pneumothorax. No acute bony abnormalities are identified.  IMPRESSION: No active cardiopulmonary disease.   Electronically Signed   By: Margarette Canada M.D.   On: 07/27/2014 16:43   Ct Head Wo Contrast  07/27/2014   CLINICAL DATA:  64 year old male with acute confusion, left hand and left leg weakness  for 1 day. Initial encounter.  EXAM: CT HEAD WITHOUT CONTRAST  TECHNIQUE: Contiguous axial images were obtained from the base of the skull through the vertex without intravenous contrast.  COMPARISON:  None.  FINDINGS: Hypodensity within the right frontoparietal region involves the cortex and compatible with an acute-subacute infarct. A calcification/clot within the right MCA branch supplying this region is noted. There is no evidence of hemorrhage.  No other intracranial abnormalities are identified, including mass lesion, hydrocephalus, extra-axial fluid collection, or midline shift.  Chronic right maxillary sinus disease noted.  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 No evidence of hemorrhage.  Chronic right maxillary sinusitis.  Critical Value/emergent results were called by telephone at the time of interpretation on 07/27/2014 at 4:31 pm to Dr. Shirlyn Goltz , who verbally acknowledged these results.   Electronically Signed   By: Margarette Canada M.D.   On: 07/27/2014 16:31   Ct Angio Neck W/cm &/or Wo/cm  07/28/2014   CLINICAL DATA:  Sudden onset LEFT-sided weakness beginning 07/17/2014. Initial encounter.  EXAM: CT ANGIOGRAPHY HEAD AND NECK  TECHNIQUE: Multidetector CT imaging of the head and neck was performed using the standard protocol during bolus administration of intravenous contrast. Multiplanar CT image reconstructions and MIPs were obtained to evaluate the vascular anatomy. Carotid stenosis measurements (when  applicable) are obtained utilizing NASCET criteria, using the distal internal carotid diameter as the denominator.  CONTRAST:  35mL OMNIPAQUE IOHEXOL 350 MG/ML SOLN  COMPARISON:  CT head 07/27/2014.  MR head 07/27/2014.  FINDINGS: CT HEAD  Calvarium and skull base: No fracture or destructive lesion. Mastoids and middle ears are grossly clear.  Paranasal sinuses: Imaged portions are clear, except for moderate  paranasal sinus disease RIGHT maxillary region.  Orbits: Negative.  Brain: No hemorrhage or neoplasm. Normal cerebral volume. Calcific embolus to a posterior sylvian M3 branch with developing cytotoxic edema throughout posterior frontal and temporal lobes as well as the adjacent white matter.  CTA NECK  Aortic arch: Standard branching. Imaged portion shows no evidence of aneurysm or dissection. No significant stenosis of the major arch vessel origins. Moderate calcific and soft plaque at the innominate origin and LEFT common carotid origin.  Right carotid system: There is extensive soft plaque and to a lesser degree calcific plaque at the RIGHT ICA origin extending for length of 2 cm cephalad into the proximal RIGHT internal carotid. Stenosis measurements of 2.3 mm proximal and 5.0 mm distal yield a slightly greater than 50% stenosis, non flow reducing, but the irregular appearance of the soft plaque strongly suggests that this vessel could represent the embolic source.  Left carotid system: Moderate calcific and soft plaque without flow reducing stenosis. Luminal diameter of 2.9 mm. Proximal and 4.6 mm distal calculates to less than 50% stenosis. No evidence of dissection, stenosis (50% or greater) or occlusion.  Vertebral arteries: LEFT minimally larger. No evidence of dissection, stenosis (50% or greater) or occlusion.  Nonvascular structures: No adenopathy. Airway midline. Lung apices show no mass or pneumothorax. No worrisome osseous lesions. Cervical spondylosis. Normal thyroid.  CTA HEAD  Anterior circulation: 1 x 4 mm calcific embolus to a RIGHT MCA M3 branch in the sylvian fissure. Distal to this, there is cytotoxic edema with absent perfusion and diminished enhancement. No large vessel occlusion of the proximal RIGHT or LEFT ICA or RIGHT or LEFT MCA M1 segment. Normal anterior cerebrals. No significant stenosis, proximal occlusion, aneurysm, or vascular malformation.  Posterior circulation: No significant  stenosis, proximal occlusion, aneurysm, or vascular malformation.  Venous sinuses: As permitted by contrast timing, patent.  Anatomic variants: None of significance.  Delayed phase: No abnormal intracranial enhancement. No luxury perfusion has developed at this time at the site of infarction.  IMPRESSION: Re-demonstration of acute RIGHT frontotemporal infarction due to a calcific embolus to a RIGHT MCA M3 branch.  Moderately irregular and extensive mixed soft plaque and calcific plaque at the RIGHT internal carotid artery origin likely represents the embolic source. Slightly greater than 50% stenosis is non flow reducing. See discussion above.   Electronically Signed   By: Rolla Flatten M.D.   On: 07/28/2014 11:24   Mr Brain Wo Contrast  07/27/2014   CLINICAL DATA:  64 yo male with acute confusion, LEFT hand and LEFT leg weakness for 1 day. Initial encounter. Stroke risk factors include hypertension.  EXAM: MRI HEAD WITHOUT CONTRAST  TECHNIQUE: Multiplanar, multiecho pulse sequences of the brain and surrounding structures were obtained without intravenous contrast.  COMPARISON:  CT head earlier today.  FINDINGS: Moderately large area of restricted diffusion affecting the RIGHT insula, and posterior limb internal capsule, along with the posterior frontal and temporal lobes representing acute infarction due to RIGHT MCA M3 branch occlusion. Small calcific embolus, responsible for the infarction, is lodged in the posterior aspect RIGHT sylvian fissure (image 14 series  8) as identified more readily on CT.  No acute hemorrhage, mass lesion, hydrocephalus, or extra-axial fluid.  No proximal large vessel occlusion of the internal carotid, middle cerebral M1, or M2 segments. Basilar artery and both vertebral arteries display patent flow voids.  Normal cerebral volume. Mild subcortical and periventricular T2 and FLAIR hyperintensities, likely chronic microvascular ischemic change. Flow voids are maintained in the internal  carotid, basilar, and both vertebral arteries. Pituitary, pineal, and cerebellar tonsils unremarkable. No upper cervical lesions. Calvarium intact. Chronic sinus disease in the RIGHT maxillary and RIGHT frontal sinuses without air-fluid level. Negative orbits. No mastoid fluid.  IMPRESSION: Moderately large area of nonhemorrhagic acute infarction affecting the RIGHT hemisphere as described, responsible for LEFT-sided weakness.  Small calcific embolus to an M3 branch of the RIGHT middle cerebral artery is responsible.  Mild small vessel disease.  Chronic sinusitis.   Electronically Signed   By: Rolla Flatten M.D.   On: 07/27/2014 18:21     PERTINENT LAB RESULTS: CBC: No results for input(s): WBC, HGB, HCT, PLT in the last 72 hours. CMET CMP     Component Value Date/Time   NA 138 07/27/2014 1545   K 4.0 07/27/2014 1545   CL 102 07/27/2014 1545   CO2 22 07/27/2014 1539   GLUCOSE 98 07/27/2014 1545   BUN 17 07/27/2014 1545   CREATININE 0.90 07/27/2014 1545   CALCIUM 9.1 07/27/2014 1539   PROT 7.0 07/27/2014 1539   ALBUMIN 4.3 07/27/2014 1539   AST 28 07/27/2014 1539   ALT 32 07/27/2014 1539   ALKPHOS 75 07/27/2014 1539   BILITOT 0.7 07/27/2014 1539   GFRNONAA >90 07/27/2014 1539   GFRAA >90 07/27/2014 1539    GFR Estimated Creatinine Clearance: 89.5 mL/min (by C-G formula based on Cr of 0.9). No results for input(s): LIPASE, AMYLASE in the last 72 hours. No results for input(s): CKTOTAL, CKMB, CKMBINDEX, TROPONINI in the last 72 hours. Invalid input(s): POCBNP No results for input(s): DDIMER in the last 72 hours. No results for input(s): HGBA1C in the last 72 hours. No results for input(s): CHOL, HDL, LDLCALC, TRIG, CHOLHDL, LDLDIRECT in the last 72 hours. No results for input(s): TSH, T4TOTAL, T3FREE, THYROIDAB in the last 72 hours.  Invalid input(s): FREET3 No results for input(s): VITAMINB12, FOLATE, FERRITIN, TIBC, IRON, RETICCTPCT in the last 72 hours. Coags: No results for  input(s): INR in the last 72 hours.  Invalid input(s): PT Microbiology: No results found for this or any previous visit (from the past 240 hour(s)).   BRIEF HOSPITAL COURSE:   Active Problems:   Acute CVA (cerebral infarction): Patient was admitted with left-sided numbness, CT of the head showed acute to subacute right frontoparietal infarct with calcification/clot within the right MCA branch. MRI brain showed Moderately large area of nonhemorrhagic acute infarction affecting the right hemisphere, small calcific embolus in M 3 branch of the right MCA.CTA head and neck confirmeds a right MCA embolic infarct, right MCA M3 calcific embolus and right ICA plaque. Further workup included a transthoracic echocardiogram which showed EF around 40-45% and grade 1 diastolic dysfunction. Patient subsequently underwent a trans-esophageal echocardiogram which showed EF around 55% with no thrombus or PFO. Patient then underwent loop recorder implantation by EP. Hemoglobin A1c was 5.3, LDL was significantly elevated at 129 (goal less than 70). Patient has been started on aspirin and statin. He currently has no focal neurological deficits on exam. He will be discharged home in a stable manner, patient will follow-up with Dr.Sethi in  1 month. Patient has been asked to follow-up with his PCP in the next few weeks  Dyslipidemia: See above  Hypertension: Blood pressure persistently elevated, we'll increase lisinopril to 20 mg on discharge.  Tobacco abuse: Counseled extensively regarding importance of cessation  TODAY-DAY OF DISCHARGE:  Subjective:   Ramin Zoll today has no headache,no chest abdominal pain,no new weakness tingling or numbness, feels much better wants to go home today.   Objective:   Blood pressure 174/87, pulse 59, temperature 97.6 F (36.4 C), temperature source Axillary, resp. rate 16, height 5\' 11"  (1.803 m), weight 87.54 kg (192 lb 15.9 oz), SpO2 100 %.  Intake/Output Summary (Last 24  hours) at 07/31/14 1635 Last data filed at 07/31/14 1132  Gross per 24 hour  Intake    700 ml  Output      0 ml  Net    700 ml   Filed Weights   07/28/14 1700  Weight: 87.54 kg (192 lb 15.9 oz)    Exam Awake Alert, Oriented *3, No new F.N deficits, Normal affect [place].AT,PERRAL Supple Neck,No JVD, No cervical lymphadenopathy appriciated.  Symmetrical Chest wall movement, Good air movement bilaterally, CTAB RRR,No Gallops,Rubs or new Murmurs, No Parasternal Heave +ve B.Sounds, Abd Soft, Non tender, No organomegaly appriciated, No rebound -guarding or rigidity. No Cyanosis, Clubbing or edema, No new Rash or bruise  DISCHARGE CONDITION: Stable  DISPOSITION:  Home  DISCHARGE INSTRUCTIONS:    Activity:  As tolerated  Diet recommendation: Heart Healthy diet  Discharge Instructions    Ambulatory referral to Neurology    Complete by:  As directed   Please schedule post stroke follow up in 2 months.     Call MD for:  extreme fatigue    Complete by:  As directed      Call MD for:  persistant nausea and vomiting    Complete by:  As directed      Diet - low sodium heart healthy    Complete by:  As directed      Increase activity slowly    Complete by:  As directed            Follow-up Information    Follow up with SETHI,PRAMOD, MD In 2 months.   Specialties:  Neurology, Radiology   Why:  Stroke Clinic, Office will call you with appointment date & time   Contact information:   [place] [address] [phone]       Follow up with [facility].   Specialty:  Rehabilitation   Why:  they will call you for a start up date and time for your therapy   Contact information:   [address] [place] [phone]      Follow up with  Melinda Crutch, MD. Schedule an appointment as soon as possible for a visit in 1 week.   Specialty:  Family Medicine   Contact  information:   [place] [address] [phone]       Follow up with Jettie Booze., MD. Schedule an appointment as soon as possible for a visit in 3 weeks.   Specialty:  Interventional Cardiology   Contact information:   [address] [place] [address] [phone]         Total Time spent on discharge equals 45 minutes.  SignedOren Binet 07/31/2014 4:35 PM

## 2014-07-31 NOTE — Progress Notes (Signed)
Physical Therapy Treatment Patient Details Name: MAHMOUD BLAZEJEWSKI MRN: [id number] DOB: Jun 02, 1950 Today's Date: 07/31/2014    History of Present Illness 64 year old male with no significant past medical history presented with sudden onset of numbness in the left side including hand as well as leg. Patient had dizziness and felt he could not maintain his balance. 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     PT Comments    Patient progressing well with balance however still showing deficits with high level balance and functioning task. Continue to recommend OPPT follow up. Educated on safety and progression at home.  Follow Up Recommendations  Outpatient PT;Supervision/Assistance - 24 hour     Equipment Recommendations  None recommended by PT    Recommendations for Other Services       Precautions / Restrictions Precautions Precautions: Fall    Mobility  Bed Mobility Overal bed mobility: Modified Independent                Transfers Overall transfer level: Modified independent                  Ambulation/Gait Ambulation/Gait assistance: Supervision Ambulation Distance (Feet): 1200 Feet Assistive device: None Gait Pattern/deviations: Step-through pattern;Scissoring     General Gait Details: Patient with some slight scissoring but no LOB   Stairs Stairs: Yes Stairs assistance: Supervision Stair Management: Alternating pattern;Forwards Number of Stairs: 24 General stair comments: light use of rail on Rt side  Wheelchair Mobility    Modified Rankin (Stroke Patients Only) Modified Rankin (Stroke Patients Only) Pre-Morbid Rankin Score: No symptoms     Balance                                    Cognition Arousal/Alertness: Awake/alert Behavior During Therapy: Impulsive Overall Cognitive Status: Impaired/Different from baseline Area of  Impairment: Attention;Safety/judgement;Awareness   Current Attention Level: Alternating Memory: Decreased recall of precautions     Awareness: Emergent        Exercises      General Comments        Pertinent Vitals/Pain Pain Assessment: No/denies pain    Home Living                      Prior Function            PT Goals (current goals can now be found in the care plan section) Progress towards PT goals: Progressing toward goals    Frequency  Min 4X/week    PT Plan Current plan remains appropriate    Co-evaluation             End of Session Equipment Utilized During Treatment: Gait belt Activity Tolerance: Patient tolerated treatment well Patient left: in chair;with call bell/phone within reach     Time: 1336-1409 PT Time Calculation (min) (ACUTE ONLY): 33 min  Charges:  $Gait Training: 8-22 mins $Therapeutic Activity: 8-22 mins                    G Codes:      Jacqualyn Posey 07/31/2014, 3:14 PM  07/31/2014 Jacqualyn Posey PTA [phone] pager [phone] office

## 2014-07-31 NOTE — CV Procedure (Signed)
Procedure: TEE  Indication: CVA  Sedation: Versed 7 mg IV, Fentanyl 75 mcg IV  Findings: Please see echo section for full report.  Normal LV size and overall systolic function, estimate EF 55% (appears better than reported from the TTE).  Normal RV size and systolic function.  Trivial mitral regurgitation.  No AS or AI.  Mild left atrial enlargement.  No LAA thrombus.  Negative bubble study, no PFO/ASD.  Mild plaque descending thoracic aorta.    No source of embolus.   Loralie Champagne 07/31/2014 10:49 AM

## 2014-07-31 NOTE — Progress Notes (Signed)
Speech Language Pathology Treatment: Cognitive-Linquistic  Patient Details Name: Daniel [name] MRN: [id number] DOB: [birth year] Today's Date: 07/31/2014 Time: 2820-6015 SLP Time Calculation (min) (ACUTE ONLY): 26 min  Assessment / Plan / Recommendation Clinical Impression  Pt required Mod cues for working memory, selective attention, and thought organization in order to accurately complete mildly complex money management task. SLP provided Min cues for emergent awareness of difficulties. Upon completion of task, pt verbalized his understanding of the need for f/u OP SLP to address higher level cognitive tasks and executive functioning.    HPI HPI: 64 year old male with no significant past medical history presented with sudden onset of numbness in the left side including hand as well as leg on 07/27/14. Patient had dizziness and felt he could not maintain his balance. 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 Speech eval ordered.  PT evaluation recommends CIR.     Pertinent Vitals Pain Assessment: No/denies pain  SLP Plan  Continue with current plan of care    Recommendations      Follow up Recommendations: Outpatient SLP Plan: Continue with current plan of care    Germain Osgood, M.A. CCC-SLP [phone]  Germain Osgood 07/31/2014, 5:10 PM

## 2014-07-31 NOTE — H&P (View-Only) (Signed)
[facility]                                                                              Patient Demographics  Daniel [name], is a 64 y.o. male, DOB - [birth year], HKV:[id number]  Admit date - 07/27/2014   Admitting Physician Robbie Lis, MD  Outpatient Primary MD for the patient is No primary care provider on file.  LOS - 3   Chief Complaint  Patient presents with  . Numbness    left arm and leg   Interim history     64 year old male with no significant past medical history presented with sudden onset of numbness in the left side including hand as well as leg. Patient had dizziness and felt he could not maintain his balance. 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 Patient was transferred to [facility], neurology was consulted. Pending further workup.  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 - Neurology follow-up appreciated. - PT and OT consultation appreciated-outpatient therapies.  - Currently on aspirin 325 mg daily. Not on antithrombotic prior to admission. - Will allow for permissive hypertension - Neurology recommends TEE to look for embolic source (scheduled for 07/31/14). If positive will need bilateral lower extremity Dopplers. If TEE negative, may need loop  recorder placed.  Hyperlipidemia -Lipid panel: Total cholesterol 95, triglycerides 119, HDL 32, LDL 129 -Started statin  Tobacco abuse -Patient counseled on tobacco cessation - declined Nicotine patch  Essential hypertension -Newly diagnosed. Allowed permissive hypertension due to recent acute stroke.  - Will start low dose ACEI (will help with cardiomyopathy too)  Cardiomyopathy - No reported CP or cardiac symptoms.  - Strong FH of heart disease per patient & spouse. - Cardiology consulted re need for ischemic evaluation. - Start low dose ACEI and titrate.  Code Status: Full Family Communication: Discussed with spouse at bedside.  Disposition Plan: Admitted, pending further workup  Time Spent in minutes   30 minutes  Procedures  None  Consults   Neurology Cardiology - pending  DVT Prophylaxis  SCDs  Lab Results  Component Value Date   PLT 224 07/27/2014    Medications  Scheduled Meds: . aspirin EC  325 mg Oral Daily  . atorvastatin  20 mg Oral q1800   Continuous Infusions:  PRN Meds:.acetaminophen, hydrALAZINE, senna-docusate  Antibiotics    Anti-infectives    None      Subjective:   Daniel [name] states that LUE weakness and numbness and slightly better than yesterday. Denies any other complaints.  Objective:   Filed Vitals:  07/29/14 2156 07/30/14 0200 07/30/14 0610 07/30/14 1008  BP: 190/76 161/68 183/82 177/70  Pulse: 85 64 64 55  Temp: 97.7 F (36.5 C) 97.8 F (36.6 C) 97.5 F (36.4 C) 98.5 F (36.9 C)  TempSrc: Oral Oral Oral Oral  Resp: 18 18 18 18   Height:      Weight:      SpO2: 97% 96% 99% 98%    Wt Readings from Last 3 Encounters:  07/28/14 87.54 kg (192 lb 15.9 oz)  07/27/14 87.544 kg (193 lb)  08/31/12 87.544 kg (193 lb)    No intake or output data in the 24 hours ending 07/30/14 1326  Exam  General: Well developed, well nourished, NAD, appears stated age. Sitting up comfortably in bed.  Cardiovascular: S1 S2  auscultated, no rubs, murmurs or gallops. Regular rate and rhythm.Telemetry: Sinus rhythm.   Respiratory: Clear to auscultation bilaterally. No increased work of breathing.  Abdomen: Soft, nontender, nondistended, + bowel sounds  Extremities: Right extremity is grade 5 x 5 power. Left extremities grade 4+/5 power. Decreased sensation over the medial aspect of left forearm and hand  Neuro: AAOx3, cranial nerves grossly intact.    Data Review   Micro Results No results found for this or any previous visit (from the past 240 hour(s)).  Radiology Reports Ct Angio Head W/cm &/or Wo Cm  07/28/2014   CLINICAL DATA:  Sudden onset LEFT-sided weakness beginning 07/17/2014. Initial encounter.  EXAM: CT ANGIOGRAPHY HEAD AND NECK  TECHNIQUE: Multidetector CT imaging of the head and neck was performed using the standard protocol during bolus administration of intravenous contrast. Multiplanar CT image reconstructions and MIPs were obtained to evaluate the vascular anatomy. Carotid stenosis measurements (when applicable) are obtained utilizing NASCET criteria, using the distal internal carotid diameter as the denominator.  CONTRAST:  62mL OMNIPAQUE IOHEXOL 350 MG/ML SOLN  COMPARISON:  CT head 07/27/2014.  MR head 07/27/2014.  FINDINGS: CT HEAD  Calvarium and skull base: No fracture or destructive lesion. Mastoids and middle ears are grossly clear.  Paranasal sinuses: Imaged portions are clear, except for moderate paranasal sinus disease RIGHT maxillary region.  Orbits: Negative.  Brain: No hemorrhage or neoplasm. Normal cerebral volume. Calcific embolus to a posterior sylvian M3 branch with developing cytotoxic edema throughout posterior frontal and temporal lobes as well as the adjacent white matter.  CTA NECK  Aortic arch: Standard branching. Imaged portion shows no evidence of aneurysm or dissection. No significant stenosis of the major arch vessel origins. Moderate calcific and soft plaque at the innominate  origin and LEFT common carotid origin.  Right carotid system: There is extensive soft plaque and to a lesser degree calcific plaque at the RIGHT ICA origin extending for length of 2 cm cephalad into the proximal RIGHT internal carotid. Stenosis measurements of 2.3 mm proximal and 5.0 mm distal yield a slightly greater than 50% stenosis, non flow reducing, but the irregular appearance of the soft plaque strongly suggests that this vessel could represent the embolic source.  Left carotid system: Moderate calcific and soft plaque without flow reducing stenosis. Luminal diameter of 2.9 mm. Proximal and 4.6 mm distal calculates to less than 50% stenosis. No evidence of dissection, stenosis (50% or greater) or occlusion.  Vertebral arteries: LEFT minimally larger. No evidence of dissection, stenosis (50% or greater) or occlusion.  Nonvascular structures: No adenopathy. Airway midline. Lung apices show no mass or pneumothorax. No worrisome osseous lesions. Cervical spondylosis. Normal thyroid.  CTA HEAD  Anterior circulation: 1 x 4  mm calcific embolus to a RIGHT MCA M3 branch in the sylvian fissure. Distal to this, there is cytotoxic edema with absent perfusion and diminished enhancement. No large vessel occlusion of the proximal RIGHT or LEFT ICA or RIGHT or LEFT MCA M1 segment. Normal anterior cerebrals. No significant stenosis, proximal occlusion, aneurysm, or vascular malformation.  Posterior circulation: No significant stenosis, proximal occlusion, aneurysm, or vascular malformation.  Venous sinuses: As permitted by contrast timing, patent.  Anatomic variants: None of significance.  Delayed phase: No abnormal intracranial enhancement. No luxury perfusion has developed at this time at the site of infarction.  IMPRESSION: Re-demonstration of acute RIGHT frontotemporal infarction due to a calcific embolus to a RIGHT MCA M3 branch.  Moderately irregular and extensive mixed soft plaque and calcific plaque at the RIGHT  internal carotid artery origin likely represents the embolic source. Slightly greater than 50% stenosis is non flow reducing. See discussion above.   Electronically Signed   By: Rolla Flatten M.D.   On: 07/28/2014 11:24   Dg Chest 2 View  07/27/2014   CLINICAL DATA:  Left CVA.  EXAM: CHEST  2 VIEW  COMPARISON:  08/31/2012  FINDINGS: The cardiomediastinal silhouette is unremarkable.  There is no evidence of focal airspace disease, pulmonary edema, suspicious pulmonary nodule/mass, pleural effusion, or pneumothorax. No acute bony abnormalities are identified.  IMPRESSION: No active cardiopulmonary disease.   Electronically Signed   By: Margarette Canada M.D.   On: 07/27/2014 16:43   Ct Head Wo Contrast  07/27/2014   CLINICAL DATA:  64 year old male with acute confusion, left hand and left leg weakness for 1 day. Initial encounter.  EXAM: CT HEAD WITHOUT CONTRAST  TECHNIQUE: Contiguous axial images were obtained from the base of the skull through the vertex without intravenous contrast.  COMPARISON:  None.  FINDINGS: Hypodensity within the right frontoparietal region involves the cortex and compatible with an acute-subacute infarct. A calcification/clot within the right MCA branch supplying this region is noted. There is no evidence of hemorrhage.  No other intracranial abnormalities are identified, including mass lesion, hydrocephalus, extra-axial fluid collection, or midline shift.  Chronic right maxillary sinus disease noted.  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 No evidence of hemorrhage.  Chronic right maxillary sinusitis.  Critical Value/emergent results were called by telephone at the time of interpretation on 07/27/2014 at 4:31 pm to Dr. Shirlyn Goltz , who verbally acknowledged these results.   Electronically Signed   By: Margarette Canada M.D.   On: 07/27/2014 16:31   Ct Angio Neck W/cm &/or Wo/cm  07/28/2014   CLINICAL  DATA:  Sudden onset LEFT-sided weakness beginning 07/17/2014. Initial encounter.  EXAM: CT ANGIOGRAPHY HEAD AND NECK  TECHNIQUE: Multidetector CT imaging of the head and neck was performed using the standard protocol during bolus administration of intravenous contrast. Multiplanar CT image reconstructions and MIPs were obtained to evaluate the vascular anatomy. Carotid stenosis measurements (when applicable) are obtained utilizing NASCET criteria, using the distal internal carotid diameter as the denominator.  CONTRAST:  20mL OMNIPAQUE IOHEXOL 350 MG/ML SOLN  COMPARISON:  CT head 07/27/2014.  MR head 07/27/2014.  FINDINGS: CT HEAD  Calvarium and skull base: No fracture or destructive lesion. Mastoids and middle ears are grossly clear.  Paranasal sinuses: Imaged portions are clear, except for moderate paranasal sinus disease RIGHT maxillary region.  Orbits: Negative.  Brain: No hemorrhage or neoplasm. Normal cerebral volume. Calcific embolus to a posterior sylvian M3  branch with developing cytotoxic edema throughout posterior frontal and temporal lobes as well as the adjacent white matter.  CTA NECK  Aortic arch: Standard branching. Imaged portion shows no evidence of aneurysm or dissection. No significant stenosis of the major arch vessel origins. Moderate calcific and soft plaque at the innominate origin and LEFT common carotid origin.  Right carotid system: There is extensive soft plaque and to a lesser degree calcific plaque at the RIGHT ICA origin extending for length of 2 cm cephalad into the proximal RIGHT internal carotid. Stenosis measurements of 2.3 mm proximal and 5.0 mm distal yield a slightly greater than 50% stenosis, non flow reducing, but the irregular appearance of the soft plaque strongly suggests that this vessel could represent the embolic source.  Left carotid system: Moderate calcific and soft plaque without flow reducing stenosis. Luminal diameter of 2.9 mm. Proximal and 4.6 mm distal  calculates to less than 50% stenosis. No evidence of dissection, stenosis (50% or greater) or occlusion.  Vertebral arteries: LEFT minimally larger. No evidence of dissection, stenosis (50% or greater) or occlusion.  Nonvascular structures: No adenopathy. Airway midline. Lung apices show no mass or pneumothorax. No worrisome osseous lesions. Cervical spondylosis. Normal thyroid.  CTA HEAD  Anterior circulation: 1 x 4 mm calcific embolus to a RIGHT MCA M3 branch in the sylvian fissure. Distal to this, there is cytotoxic edema with absent perfusion and diminished enhancement. No large vessel occlusion of the proximal RIGHT or LEFT ICA or RIGHT or LEFT MCA M1 segment. Normal anterior cerebrals. No significant stenosis, proximal occlusion, aneurysm, or vascular malformation.  Posterior circulation: No significant stenosis, proximal occlusion, aneurysm, or vascular malformation.  Venous sinuses: As permitted by contrast timing, patent.  Anatomic variants: None of significance.  Delayed phase: No abnormal intracranial enhancement. No luxury perfusion has developed at this time at the site of infarction.  IMPRESSION: Re-demonstration of acute RIGHT frontotemporal infarction due to a calcific embolus to a RIGHT MCA M3 branch.  Moderately irregular and extensive mixed soft plaque and calcific plaque at the RIGHT internal carotid artery origin likely represents the embolic source. Slightly greater than 50% stenosis is non flow reducing. See discussion above.   Electronically Signed   By: Rolla Flatten M.D.   On: 07/28/2014 11:24   Mr Brain Wo Contrast  07/27/2014   CLINICAL DATA:  64 yo male with acute confusion, LEFT hand and LEFT leg weakness for 1 day. Initial encounter. Stroke risk factors include hypertension.  EXAM: MRI HEAD WITHOUT CONTRAST  TECHNIQUE: Multiplanar, multiecho pulse sequences of the brain and surrounding structures were obtained without intravenous contrast.  COMPARISON:  CT head earlier today.   FINDINGS: Moderately large area of restricted diffusion affecting the RIGHT insula, and posterior limb internal capsule, along with the posterior frontal and temporal lobes representing acute infarction due to RIGHT MCA M3 branch occlusion. Small calcific embolus, responsible for the infarction, is lodged in the posterior aspect RIGHT sylvian fissure (image 14 series 8) as identified more readily on CT.  No acute hemorrhage, mass lesion, hydrocephalus, or extra-axial fluid.  No proximal large vessel occlusion of the internal carotid, middle cerebral M1, or M2 segments. Basilar artery and both vertebral arteries display patent flow voids.  Normal cerebral volume. Mild subcortical and periventricular T2 and FLAIR hyperintensities, likely chronic microvascular ischemic change. Flow voids are maintained in the internal carotid, basilar, and both vertebral arteries. Pituitary, pineal, and cerebellar tonsils unremarkable. No upper cervical lesions. Calvarium intact. Chronic sinus disease in the  RIGHT maxillary and RIGHT frontal sinuses without air-fluid level. Negative orbits. No mastoid fluid.  IMPRESSION: Moderately large area of nonhemorrhagic acute infarction affecting the RIGHT hemisphere as described, responsible for LEFT-sided weakness.  Small calcific embolus to an M3 branch of the RIGHT middle cerebral artery is responsible.  Mild small vessel disease.  Chronic sinusitis.   Electronically Signed   By: Rolla Flatten M.D.   On: 07/27/2014 18:21    CBC  Recent Labs Lab 07/27/14 1539 07/27/14 1545  WBC 9.7  --   HGB 16.2 17.0  HCT 46.8 50.0  PLT 224  --   MCV 90.0  --   MCH 31.2  --   MCHC 34.6  --   RDW 13.0  --   LYMPHSABS 2.6  --   MONOABS 0.7  --   EOSABS 0.1  --   BASOSABS 0.1  --     Chemistries   Recent Labs Lab 07/27/14 1539 07/27/14 1545  NA 137 138  K 4.1 4.0  CL 105 102  CO2 22  --   GLUCOSE 96 98  BUN 18 17  CREATININE 0.86 0.90  CALCIUM 9.1  --   AST 28  --   ALT 32   --   ALKPHOS 75  --   BILITOT 0.7  --    ------------------------------------------------------------------------------------------------------------------ estimated creatinine clearance is 89.5 mL/min (by C-G formula based on Cr of 0.9). ------------------------------------------------------------------------------------------------------------------  Recent Labs  07/28/14 0744  HGBA1C 5.3   ------------------------------------------------------------------------------------------------------------------  Recent Labs  07/28/14 0744  CHOL 185  HDL 32*  LDLCALC 129*  TRIG 119  CHOLHDL 5.8   ------------------------------------------------------------------------------------------------------------------ No results for input(s): TSH, T4TOTAL, T3FREE, THYROIDAB in the last 72 hours.  Invalid input(s): FREET3 ------------------------------------------------------------------------------------------------------------------ No results for input(s): VITAMINB12, FOLATE, FERRITIN, TIBC, IRON, RETICCTPCT in the last 72 hours.  Coagulation profile  Recent Labs Lab 07/27/14 1539  INR 0.97    No results for input(s): DDIMER in the last 72 hours.  Cardiac Enzymes No results for input(s): CKMB, TROPONINI, MYOGLOBIN in the last 168 hours.  Invalid input(s): CK ------------------------------------------------------------------------------------------------------------------ Invalid input(s): POCBNP   Time spent: 30 minutes.   Vernell Leep, MD, FACP, FHM. [facility] Pager [phone]  If 7PM-7AM, please contact night-coverage [email or web address] Password [email or web address] 07/30/2014, 1:26 PM

## 2014-07-31 NOTE — Progress Notes (Signed)
  Echocardiogram Echocardiogram Transesophageal has been performed.  Daniel [name] M 07/31/2014, 11:58 AM

## 2014-07-31 NOTE — Interval H&P Note (Signed)
History and Physical Interval Note:  07/31/2014 5:54 PM  Daniel [name]  has presented today for surgery, with the diagnosis of stroke  The various methods of treatment have been discussed with the patient and family. After consideration of risks, benefits and other options for treatment, the patient has consented to  Procedure(s): LOOP RECORDER IMPLANT (N/A) as a surgical intervention .  The patient's history has been reviewed, patient examined, no change in status, stable for surgery.  I have reviewed the patient's chart and labs.  Questions were answered to the patient's satisfaction.     Daniel [name]

## 2014-07-31 NOTE — Progress Notes (Signed)
    Spoke with Dr. Leonie Man and reviewed Dr. Antionette Char note.  I agree that cardiac cath can wait until he would be a candidate for the IV anticoagulation that may be needed for PCI.  Will wait at least 2 weeks post CVA.  EF rechecked by TEE earlier today and LVEF normalized.  Plan is for EP to place a loop recorder later today.     Jettie Booze, MD

## 2014-07-31 NOTE — Interval H&P Note (Signed)
History and Physical Interval Note:  07/31/2014 10:29 AM  Daniel [name]  has presented today for surgery, with the diagnosis of STROKE  The various methods of treatment have been discussed with the patient and family. After consideration of risks, benefits and other options for treatment, the patient has consented to  Procedure(s): TRANSESOPHAGEAL ECHOCARDIOGRAM (TEE) (N/A) as a surgical intervention .  The patient's history has been reviewed, patient examined, no change in status, stable for surgery.  I have reviewed the patient's chart and labs.  Questions were answered to the patient's satisfaction.     [name] [employer]

## 2014-07-31 NOTE — Progress Notes (Signed)
    Subjective:  No chest pain or dyspnea today.  Objective:  Vital Signs in the last 24 hours: Temp:  [97.6 F (36.4 C)-98.4 F (36.9 C)] 97.6 F (36.4 C) (03/23 1321) Pulse Rate:  [52-76] 59 (03/23 1321) Resp:  [10-24] 16 (03/23 1321) BP: (132-204)/(67-119) 174/87 mmHg (03/23 1321) SpO2:  [94 %-100 %] 100 % (03/23 1321) FiO2 (%):  [98 %] 98 % (03/23 1020)  Intake/Output from previous day: 03/22 0701 - 03/23 0700 In: 1000 [P.O.:1000] Out: -   Physical Exam: Pt is alert and oriented, NAD HEENT: normal Neck: JVP - normal Lungs: CTA bilaterally CV: RRR without murmur or gallop Abd: soft, NT, Positive BS, no hepatomegaly Ext: no C/C/E, distal pulses intact and equal Skin: warm/dry no rash   Lab Results: No results for input(s): WBC, HGB, PLT in the last 72 hours. No results for input(s): NA, K, CL, CO2, GLUCOSE, BUN, CREATININE in the last 72 hours. No results for input(s): TROPONINI in the last 72 hours.  Invalid input(s): CK, MB  Cardiac Studies: TEE: normal LV function, no valvular disease  Tele: Sinus rhythm  Assessment/Plan:  1. Stroke - TEE reviewed - improved LV function (now normal) 2. Exertional chest pain 3. Cardiomyopathy, LVEF seems improved.   All data reviewed. Meds appropriate. Think it's best to allow him about 2 weeks to recover from his stroke before cardiac cath. In setting of exertional dyspnea and chest pain (with mowing his lawn), cath +/- PCI seems appropriate. Will arrange f/u office visit next week then outpatient cath with Dr Irish Lack as long as sx's stable.   [name], M.D. 07/31/2014, 2:26 PM

## 2014-07-31 NOTE — Progress Notes (Signed)
Talked to patient about DCP/ Outpatient physical/ occupational therapy; patient chose [facility]; orders/ clinical information faxed to the rehab center and they will call the patient at home with a start up date and time for his therapyAneta Mins [phone]

## 2014-07-31 NOTE — Consult Note (Signed)
ELECTROPHYSIOLOGY CONSULT NOTE  Patient ID: Daniel [name] MRN: [id number], DOB/AGE: July 04, 1950   Admit date: 07/27/2014 Date of Consult: 07/31/2014  Primary Physician:  Melinda Crutch, MD  Reason for Consultation: Cryptogenic stroke; recommendations regarding Implantable Loop Recorder  History of Present Illness [name] was admitted on 07/27/2014 with acute CVA. he has been monitored on telemetry which has demonstrated no arrhythmias. No cause has been identified. TEE revealed no cardiac source. EP has been asked to evaluate for placement of an implantable loop recorder to monitor for atrial fibrillation.  The patient has a depressed EF and is scheduled for elective outpatient catheterization.  EF by TEE however was improved.  Past Medical History Past Medical History  Diagnosis Date  . Essential hypertension   . CVA (cerebral infarction)     Past Surgical History Past Surgical History  Procedure Laterality Date  . Circumcision      Allergies/Intolerances No Known Allergies Inpatient Medications . aspirin EC  325 mg Oral Daily  . atorvastatin  20 mg Oral q1800  . lisinopril  5 mg Oral Daily   . sodium chloride     Social History History   Social History  . Marital Status: Married    Spouse Name: N/A  . Number of Children: N/A  . Years of Education: N/A   Occupational History  . Not on file.   Social History Main Topics  . Smoking status: Current Every Day Smoker -- 0.25 packs/day    Types: Cigarettes  . Smokeless tobacco: Never Used  . Alcohol Use: No  . Drug Use: No  . Sexual Activity: Not on file   Other Topics Concern  . Not on file   Social History Narrative     Physical Exam Blood pressure 174/87, pulse 59, temperature 97.6 F (36.4 C), temperature source Axillary, resp. rate 16, height 5\' 11"  (1.803 m), weight 192 lb 15.9 oz (87.54 kg), SpO2 100 %.  General: Well developed, well appearing 64 y.o. male in no acute distress. HEENT: Normocephalic,  atraumatic. EOMs intact. Sclera nonicteric. Oropharynx clear.  Neck: Supple without bruits. No JVD. Lungs: Respirations regular and unlabored, CTA bilaterally. No wheezes, rales or rhonchi. Heart: RRR. S1, S2 present. No murmurs, rub, S3 or S4. Abdomen: Soft, non-tender, non-distended. BS present x 4 quadrants. No hepatosplenomegaly.  Extremities: No clubbing, cyanosis or edema. DP/PT/Radials 2+ and equal bilaterally. Psych: Normal affect. Neuro: Alert and oriented X 3. Moves all extremities spontaneously. Musculoskeletal: No kyphosis. Skin: Intact. Warm and dry. No rashes or petechiae in exposed areas.   Labs Lab Results  Component Value Date   WBC 9.7 07/27/2014   HGB 17.0 07/27/2014   HCT 50.0 07/27/2014   MCV 90.0 07/27/2014   PLT 224 07/27/2014    Recent Labs Lab 07/27/14 1539 07/27/14 1545  NA 137 138  K 4.1 4.0  CL 105 102  CO2 22  --   BUN 18 17  CREATININE 0.86 0.90  CALCIUM 9.1  --   PROT 7.0  --   BILITOT 0.7  --   ALKPHOS 75  --   ALT 32  --   AST 28  --   GLUCOSE 96 98   No results for input(s): INR in the last 72 hours.  Radiology/Studies Ct Angio Head W/cm &/or Wo Cm  07/28/2014   CLINICAL DATA:  Sudden onset LEFT-sided weakness beginning 07/17/2014. Initial encounter.  EXAM: CT ANGIOGRAPHY HEAD AND NECK  TECHNIQUE: Multidetector CT imaging of the head and neck  was performed using the standard protocol during bolus administration of intravenous contrast. Multiplanar CT image reconstructions and MIPs were obtained to evaluate the vascular anatomy. Carotid stenosis measurements (when applicable) are obtained utilizing NASCET criteria, using the distal internal carotid diameter as the denominator.  CONTRAST:  62mL OMNIPAQUE IOHEXOL 350 MG/ML SOLN  COMPARISON:  CT head 07/27/2014.  MR head 07/27/2014.  FINDINGS: CT HEAD  Calvarium and skull base: No fracture or destructive lesion. Mastoids and middle ears are grossly clear.  Paranasal sinuses: Imaged portions are  clear, except for moderate paranasal sinus disease RIGHT maxillary region.  Orbits: Negative.  Brain: No hemorrhage or neoplasm. Normal cerebral volume. Calcific embolus to a posterior sylvian M3 branch with developing cytotoxic edema throughout posterior frontal and temporal lobes as well as the adjacent white matter.  CTA NECK  Aortic arch: Standard branching. Imaged portion shows no evidence of aneurysm or dissection. No significant stenosis of the major arch vessel origins. Moderate calcific and soft plaque at the innominate origin and LEFT common carotid origin.  Right carotid system: There is extensive soft plaque and to a lesser degree calcific plaque at the RIGHT ICA origin extending for length of 2 cm cephalad into the proximal RIGHT internal carotid. Stenosis measurements of 2.3 mm proximal and 5.0 mm distal yield a slightly greater than 50% stenosis, non flow reducing, but the irregular appearance of the soft plaque strongly suggests that this vessel could represent the embolic source.  Left carotid system: Moderate calcific and soft plaque without flow reducing stenosis. Luminal diameter of 2.9 mm. Proximal and 4.6 mm distal calculates to less than 50% stenosis. No evidence of dissection, stenosis (50% or greater) or occlusion.  Vertebral arteries: LEFT minimally larger. No evidence of dissection, stenosis (50% or greater) or occlusion.  Nonvascular structures: No adenopathy. Airway midline. Lung apices show no mass or pneumothorax. No worrisome osseous lesions. Cervical spondylosis. Normal thyroid.  CTA HEAD  Anterior circulation: 1 x 4 mm calcific embolus to a RIGHT MCA M3 branch in the sylvian fissure. Distal to this, there is cytotoxic edema with absent perfusion and diminished enhancement. No large vessel occlusion of the proximal RIGHT or LEFT ICA or RIGHT or LEFT MCA M1 segment. Normal anterior cerebrals. No significant stenosis, proximal occlusion, aneurysm, or vascular malformation.  Posterior  circulation: No significant stenosis, proximal occlusion, aneurysm, or vascular malformation.  Venous sinuses: As permitted by contrast timing, patent.  Anatomic variants: None of significance.  Delayed phase: No abnormal intracranial enhancement. No luxury perfusion has developed at this time at the site of infarction.  IMPRESSION: Re-demonstration of acute RIGHT frontotemporal infarction due to a calcific embolus to a RIGHT MCA M3 branch.  Moderately irregular and extensive mixed soft plaque and calcific plaque at the RIGHT internal carotid artery origin likely represents the embolic source. Slightly greater than 50% stenosis is non flow reducing. See discussion above.   Electronically Signed   By: Rolla Flatten M.D.   On: 07/28/2014 11:24   Dg Chest 2 View  07/27/2014   CLINICAL DATA:  Left CVA.  EXAM: CHEST  2 VIEW  COMPARISON:  08/31/2012  FINDINGS: The cardiomediastinal silhouette is unremarkable.  There is no evidence of focal airspace disease, pulmonary edema, suspicious pulmonary nodule/mass, pleural effusion, or pneumothorax. No acute bony abnormalities are identified.  IMPRESSION: No active cardiopulmonary disease.   Electronically Signed   By: Margarette Canada M.D.   On: 07/27/2014 16:43   Ct Head Wo Contrast  07/27/2014   CLINICAL DATA:  64 year old male with acute confusion, left hand and left leg weakness for 1 day. Initial encounter.  EXAM: CT HEAD WITHOUT CONTRAST  TECHNIQUE: Contiguous axial images were obtained from the base of the skull through the vertex without intravenous contrast.  COMPARISON:  None.  FINDINGS: Hypodensity within the right frontoparietal region involves the cortex and compatible with an acute-subacute infarct. A calcification/clot within the right MCA branch supplying this region is noted. There is no evidence of hemorrhage.  No other intracranial abnormalities are identified, including mass lesion, hydrocephalus, extra-axial fluid collection, or midline shift.  Chronic right  maxillary sinus disease noted.  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 No evidence of hemorrhage.  Chronic right maxillary sinusitis.  Critical Value/emergent results were called by telephone at the time of interpretation on 07/27/2014 at 4:31 pm to Dr. Shirlyn Goltz , who verbally acknowledged these results.   Electronically Signed   By: Margarette Canada M.D.   On: 07/27/2014 16:31   Ct Angio Neck W/cm &/or Wo/cm  07/28/2014   CLINICAL DATA:  Sudden onset LEFT-sided weakness beginning 07/17/2014. Initial encounter.  EXAM: CT ANGIOGRAPHY HEAD AND NECK  TECHNIQUE: Multidetector CT imaging of the head and neck was performed using the standard protocol during bolus administration of intravenous contrast. Multiplanar CT image reconstructions and MIPs were obtained to evaluate the vascular anatomy. Carotid stenosis measurements (when applicable) are obtained utilizing NASCET criteria, using the distal internal carotid diameter as the denominator.  CONTRAST:  35mL OMNIPAQUE IOHEXOL 350 MG/ML SOLN  COMPARISON:  CT head 07/27/2014.  MR head 07/27/2014.  FINDINGS: CT HEAD  Calvarium and skull base: No fracture or destructive lesion. Mastoids and middle ears are grossly clear.  Paranasal sinuses: Imaged portions are clear, except for moderate paranasal sinus disease RIGHT maxillary region.  Orbits: Negative.  Brain: No hemorrhage or neoplasm. Normal cerebral volume. Calcific embolus to a posterior sylvian M3 branch with developing cytotoxic edema throughout posterior frontal and temporal lobes as well as the adjacent white matter.  CTA NECK  Aortic arch: Standard branching. Imaged portion shows no evidence of aneurysm or dissection. No significant stenosis of the major arch vessel origins. Moderate calcific and soft plaque at the innominate origin and LEFT common carotid origin.  Right carotid system: There is extensive soft plaque and to  a lesser degree calcific plaque at the RIGHT ICA origin extending for length of 2 cm cephalad into the proximal RIGHT internal carotid. Stenosis measurements of 2.3 mm proximal and 5.0 mm distal yield a slightly greater than 50% stenosis, non flow reducing, but the irregular appearance of the soft plaque strongly suggests that this vessel could represent the embolic source.  Left carotid system: Moderate calcific and soft plaque without flow reducing stenosis. Luminal diameter of 2.9 mm. Proximal and 4.6 mm distal calculates to less than 50% stenosis. No evidence of dissection, stenosis (50% or greater) or occlusion.  Vertebral arteries: LEFT minimally larger. No evidence of dissection, stenosis (50% or greater) or occlusion.  Nonvascular structures: No adenopathy. Airway midline. Lung apices show no mass or pneumothorax. No worrisome osseous lesions. Cervical spondylosis. Normal thyroid.  CTA HEAD  Anterior circulation: 1 x 4 mm calcific embolus to a RIGHT MCA M3 branch in the sylvian fissure. Distal to this, there is cytotoxic edema with absent perfusion and diminished enhancement. No large vessel occlusion of the proximal RIGHT or LEFT ICA or RIGHT or LEFT MCA M1 segment. Normal anterior cerebrals. No  significant stenosis, proximal occlusion, aneurysm, or vascular malformation.  Posterior circulation: No significant stenosis, proximal occlusion, aneurysm, or vascular malformation.  Venous sinuses: As permitted by contrast timing, patent.  Anatomic variants: None of significance.  Delayed phase: No abnormal intracranial enhancement. No luxury perfusion has developed at this time at the site of infarction.  IMPRESSION: Re-demonstration of acute RIGHT frontotemporal infarction due to a calcific embolus to a RIGHT MCA M3 branch.  Moderately irregular and extensive mixed soft plaque and calcific plaque at the RIGHT internal carotid artery origin likely represents the embolic source. Slightly greater than 50% stenosis  is non flow reducing. See discussion above.   Electronically Signed   By: Rolla Flatten M.D.   On: 07/28/2014 11:24   Mr Brain Wo Contrast  07/27/2014   CLINICAL DATA:  64 yo male with acute confusion, LEFT hand and LEFT leg weakness for 1 day. Initial encounter. Stroke risk factors include hypertension.  EXAM: MRI HEAD WITHOUT CONTRAST  TECHNIQUE: Multiplanar, multiecho pulse sequences of the brain and surrounding structures were obtained without intravenous contrast.  COMPARISON:  CT head earlier today.  FINDINGS: Moderately large area of restricted diffusion affecting the RIGHT insula, and posterior limb internal capsule, along with the posterior frontal and temporal lobes representing acute infarction due to RIGHT MCA M3 branch occlusion. Small calcific embolus, responsible for the infarction, is lodged in the posterior aspect RIGHT sylvian fissure (image 14 series 8) as identified more readily on CT.  No acute hemorrhage, mass lesion, hydrocephalus, or extra-axial fluid.  No proximal large vessel occlusion of the internal carotid, middle cerebral M1, or M2 segments. Basilar artery and both vertebral arteries display patent flow voids.  Normal cerebral volume. Mild subcortical and periventricular T2 and FLAIR hyperintensities, likely chronic microvascular ischemic change. Flow voids are maintained in the internal carotid, basilar, and both vertebral arteries. Pituitary, pineal, and cerebellar tonsils unremarkable. No upper cervical lesions. Calvarium intact. Chronic sinus disease in the RIGHT maxillary and RIGHT frontal sinuses without air-fluid level. Negative orbits. No mastoid fluid.  IMPRESSION: Moderately large area of nonhemorrhagic acute infarction affecting the RIGHT hemisphere as described, responsible for LEFT-sided weakness.  Small calcific embolus to an M3 branch of the RIGHT middle cerebral artery is responsible.  Mild small vessel disease.  Chronic sinusitis.   Electronically Signed   By: Rolla Flatten M.D.   On: 07/27/2014 18:21    12-lead ECG sinus rhythm, no afib Telemetry sinus rhythm, no afib   Assessment and Plan 1. Cryptogenic stroke  TEE is negative, we recommend loop recorder insertion to monitor for AF. The indication for loop recorder insertion / monitoring for AF in setting of cryptogenic stroke was discussed with the patient. The loop recorder insertion procedure was reviewed in detail including risks and benefits. These risks include but are not limited to bleeding and infection. The patient expressed verbal understanding and agrees to proceed. The patient was also counseled regarding wound care and device follow-up.  WIll proceed at this time  Anticipate discharge later today Dr Irish Lack to arrange return for cath in 2-3 weeks  Signed, Eloise Mula 07/31/2014, 5:51 PM

## 2014-07-31 NOTE — Op Note (Signed)
SURGEON:  [name], MD     PREPROCEDURE DIAGNOSIS:  Cryptogenic Stroke    POSTPROCEDURE DIAGNOSIS:  Cryptogenic Stroke     PROCEDURES:   1. Implantable loop recorder implantation    INTRODUCTION:  Daniel [name] is a 64 y.o. male with a history of unexplained stroke who presents today for implantable loop implantation.  The patient has had a cryptogenic stroke.  Despite an extensive workup by neurology, no reversible causes have been identified.  he has worn telemetry during which he did not have arrhythmias.  There is significant concern for possible atrial fibrillation as the cause for the patients stroke.  The patient therefore presents today for implantable loop implantation.     DESCRIPTION OF PROCEDURE:  Informed written consent was obtained, and the patient was brought to the electrophysiology lab in a fasting state.  The patient required no sedation for the procedure today.  Mapping over the patient's chest was performed by the EP lab staff to identify the area where electrograms were most prominent for ILR recording.  This area was found to be the left parasternal region over the 3rd-4th intercostal space. The patients left chest was therefore prepped and draped in the usual sterile fashion by the EP lab staff. The skin overlying the left parasternal region was infiltrated with lidocaine for local analgesia.  A 0.5-cm incision was made over the left parasternal region over the 3rd intercostal space.  A subcutaneous ILR pocket was fashioned using a combination of sharp and blunt dissection.  A Medtronic Reveal [place] model [id number] implantable loop recorder was then placed into the pocket  R waves were very prominent and measured 0.53mV. EBL<1 ml.  Steri- Strips and a sterile dressing were then applied.  There were no early apparent complications.     CONCLUSIONS:   1. Successful implantation of a Medtronic Reveal LINQ implantable loop recorder for cryptogenic stroke  2. No early  apparent complications.

## 2014-07-31 NOTE — Progress Notes (Signed)
STROKE TEAM PROGRESS NOTE   HISTORY Daniel [name] is a RH 64 y.o. male who reports that on Friday he was working in the yard and was at baseline. When he went into the house he noted while in the kitchen acute onset left hand numbness (LKW 07/26/2014 1300). This progressed to involve the arm and then the LLE. He became off balance and was walking into walls. He felt as if he was drunk. Noted some twitching of the left eye as well for a time. He did not seek medical attention at that time. Saturday 3/19/016 felt as if he was getting some RLE numbness as well and presented for evaluation.  Patient was not administered TPA secondary to delay in arrival. He was admitted for further evaluation and treatment.   SUBJECTIVE (INTERVAL HISTORY) Family at the bedside. Pt just back from TEE.   OBJECTIVE Temp:  [97.6 F (36.4 C)-98.5 F (36.9 C)] 98.1 F (36.7 C) (03/23 0926) Pulse Rate:  [55-72] 57 (03/23 0926) Cardiac Rhythm:  [-]  Resp:  [18-20] 20 (03/23 0957) BP: (147-193)/(67-95) 174/81 mmHg (03/23 0959) SpO2:  [97 %-100 %] 97 % (03/23 0957)   Recent Labs Lab 07/28/14 0622  GLUCAP 101*    Recent Labs Lab 07/27/14 1539 07/27/14 1545  NA 137 138  K 4.1 4.0  CL 105 102  CO2 22  --   GLUCOSE 96 98  BUN 18 17  CREATININE 0.86 0.90  CALCIUM 9.1  --     Recent Labs Lab 07/27/14 1539  AST 28  ALT 32  ALKPHOS 75  BILITOT 0.7  PROT 7.0  ALBUMIN 4.3    Recent Labs Lab 07/27/14 1539 07/27/14 1545  WBC 9.7  --   NEUTROABS 6.2  --   HGB 16.2 17.0  HCT 46.8 50.0  MCV 90.0  --   PLT 224  --    No results for input(s): CKTOTAL, CKMB, CKMBINDEX, TROPONINI in the last 168 hours. No results for input(s): LABPROT, INR in the last 72 hours. No results for input(s): COLORURINE, LABSPEC, [place], GLUCOSEU, HGBUR, BILIRUBINUR, KETONESUR, PROTEINUR, UROBILINOGEN, NITRITE, LEUKOCYTESUR in the last 72 hours.  Invalid input(s): APPERANCEUR     Component Value Date/Time   CHOL  185 07/28/2014 0744   TRIG 119 07/28/2014 0744   HDL 32* 07/28/2014 0744   CHOLHDL 5.8 07/28/2014 0744   VLDL 24 07/28/2014 0744   LDLCALC 129* 07/28/2014 0744   Lab Results  Component Value Date   HGBA1C 5.3 07/28/2014      Component Value Date/Time   LABOPIA NONE DETECTED 07/27/2014 1827   COCAINSCRNUR NONE DETECTED 07/27/2014 1827   LABBENZ NONE DETECTED 07/27/2014 1827   AMPHETMU NONE DETECTED 07/27/2014 1827   THCU NONE DETECTED 07/27/2014 1827   LABBARB NONE DETECTED 07/27/2014 1827     Recent Labs Lab 07/27/14 1518  ETH <5    No results found.  TEE  Normal LV size and overall systolic function, estimate EF 55% (appears better than reported from the TTE). Normal RV size and systolic function. Trivial mitral regurgitation. No AS or AI. Mild left atrial enlargement. No LAA thrombus. Negative bubble study, no PFO/ASD. Mild plaque descending thoracic aorta.     PHYSICAL EXAM Pleasant obese caucasian middle aged male not in distress. . Afebrile. Head is nontraumatic. Neck is supple without bruit.    Cardiac exam no murmur or gallop. Lungs are clear to auscultation. Distal pulses are well felt. Neurological Exam :  Awake alert oriented x  3 normal speech and language. Mild left lower face asymmetry. Tongue midline. No drift. Mild diminished fine finger movements on left. Orbits right over left upper extremity. Mild left grip weak.. Normal sensation . Normal coordination.   ASSESSMENT/PLAN Mr. Daniel [name] is a 64 y.o. male with no significant past medical history  presenting with feeling off balance, left arm and leg numbness. He did not receive IV t-PA due to delay in arrival.   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 They are considering timing of this procedure. Dr. Leonie Man looking at this admission. They will discuss. This has been explained to patient/family by Dr. Leonie Man and he is agreeable.   HgbA1c 5.3  SCDs for VTE prophylaxis  Diet NPO time specified Except for: Sips with Meds   no antithrombotic prior to admission, now on aspirin 325 mg orally every day  Patient counseled to be compliant with his antithrombotic medications  Ongoing aggressive stroke risk factor management  Therapy recommendations:  CIR. Too good for CIR per Dr. Letta Pate.   Disposition:  Return home, OP therapy  Follow-up Stroke Clinic at [facility] with Dr. Antony Contras in 2 months, order placed.   Hypertension  No hisotry, on no home meds  Elevated  New lisinopril  Hyperlipidemia  Home meds:  No statin  LDL 129, goal < 70  Add statin, lipitor 20  Continue statin at discharge  Other Stroke Risk Factors  Cigarette smoker, advised to stop smoking  Cardiomyopathy, new diagnosis  EF 40-45%  Trop neg  Exertional CP and SOB hx  Will need LHC per cardiology  Neuro ok with diagnostic cath now, if intervention cath needed, concerned with use of IV heparin/angiomax, etc during the procedure. For that, would need to wait at least 2-3 weeks if he needs intervention. Timing of dual antiplatelets alone would be ok for now.  Dr. Leonie Man to discuss timing of ischemic workup with Dr. Marinda Elk day # [place] for Pager information 07/31/2014 10:02 AM  I have personally examined this patient, reviewed notes, independently viewed imaging studies, participated in medical  decision making and plan of care. I have made any additions or clarifications directly to the above note. Agree with note above. Discuss with cardiologist Dr.Varanasi patient can have outpatient cardiac evaluation for coronary artery disease in 2-3 weeks. He may be discharged after loop recorder to be done prior to discharge. Discuss with Dr. Rayann Heman and Lambert Keto, MD Medical Director [place] Pager: [phone] 07/31/2014 4:23 PM   To contact Stroke Continuity provider, please refer to [email or web address] After hours, contact General Neurology

## 2014-07-31 NOTE — H&P (View-Only) (Signed)
ELECTROPHYSIOLOGY CONSULT NOTE  Patient ID: Daniel [name] MRN: [id number], DOB/AGE: 1950/09/25   Admit date: 07/27/2014 Date of Consult: 07/31/2014  Primary Physician:  Melinda Crutch, MD  Reason for Consultation: Cryptogenic stroke; recommendations regarding Implantable Loop Recorder  History of Present Illness Daniel [name] was admitted on 07/27/2014 with acute CVA. he has been monitored on telemetry which has demonstrated no arrhythmias. No cause has been identified. TEE revealed no cardiac source. EP has been asked to evaluate for placement of an implantable loop recorder to monitor for atrial fibrillation.  The patient has a depressed EF and is scheduled for elective outpatient catheterization.  EF by TEE however was improved.  Past Medical History Past Medical History  Diagnosis Date  . Essential hypertension   . CVA (cerebral infarction)     Past Surgical History Past Surgical History  Procedure Laterality Date  . Circumcision      Allergies/Intolerances No Known Allergies Inpatient Medications . aspirin EC  325 mg Oral Daily  . atorvastatin  20 mg Oral q1800  . lisinopril  5 mg Oral Daily   . sodium chloride     Social History History   Social History  . Marital Status: Married    Spouse Name: N/A  . Number of Children: N/A  . Years of Education: N/A   Occupational History  . Not on file.   Social History Main Topics  . Smoking status: Current Every Day Smoker -- 0.25 packs/day    Types: Cigarettes  . Smokeless tobacco: Never Used  . Alcohol Use: No  . Drug Use: No  . Sexual Activity: Not on file   Other Topics Concern  . Not on file   Social History Narrative     Physical Exam Blood pressure 174/87, pulse 59, temperature 97.6 F (36.4 C), temperature source Axillary, resp. rate 16, height 5\' 11"  (1.803 m), weight 192 lb 15.9 oz (87.54 kg), SpO2 100 %.  General: Well developed, well appearing 64 y.o. male in no acute distress. HEENT: Normocephalic,  atraumatic. EOMs intact. Sclera nonicteric. Oropharynx clear.  Neck: Supple without bruits. No JVD. Lungs: Respirations regular and unlabored, CTA bilaterally. No wheezes, rales or rhonchi. Heart: RRR. S1, S2 present. No murmurs, rub, S3 or S4. Abdomen: Soft, non-tender, non-distended. BS present x 4 quadrants. No hepatosplenomegaly.  Extremities: No clubbing, cyanosis or edema. DP/PT/Radials 2+ and equal bilaterally. Psych: Normal affect. Neuro: Alert and oriented X 3. Moves all extremities spontaneously. Musculoskeletal: No kyphosis. Skin: Intact. Warm and dry. No rashes or petechiae in exposed areas.   Labs Lab Results  Component Value Date   WBC 9.7 07/27/2014   HGB 17.0 07/27/2014   HCT 50.0 07/27/2014   MCV 90.0 07/27/2014   PLT 224 07/27/2014    Recent Labs Lab 07/27/14 1539 07/27/14 1545  NA 137 138  K 4.1 4.0  CL 105 102  CO2 22  --   BUN 18 17  CREATININE 0.86 0.90  CALCIUM 9.1  --   PROT 7.0  --   BILITOT 0.7  --   ALKPHOS 75  --   ALT 32  --   AST 28  --   GLUCOSE 96 98   No results for input(s): INR in the last 72 hours.  Radiology/Studies Ct Angio Head W/cm &/or Wo Cm  07/28/2014   CLINICAL DATA:  Sudden onset LEFT-sided weakness beginning 07/17/2014. Initial encounter.  EXAM: CT ANGIOGRAPHY HEAD AND NECK  TECHNIQUE: Multidetector CT imaging of the head and neck  was performed using the standard protocol during bolus administration of intravenous contrast. Multiplanar CT image reconstructions and MIPs were obtained to evaluate the vascular anatomy. Carotid stenosis measurements (when applicable) are obtained utilizing NASCET criteria, using the distal internal carotid diameter as the denominator.  CONTRAST:  3mL OMNIPAQUE IOHEXOL 350 MG/ML SOLN  COMPARISON:  CT head 07/27/2014.  MR head 07/27/2014.  FINDINGS: CT HEAD  Calvarium and skull base: No fracture or destructive lesion. Mastoids and middle ears are grossly clear.  Paranasal sinuses: Imaged portions are  clear, except for moderate paranasal sinus disease RIGHT maxillary region.  Orbits: Negative.  Brain: No hemorrhage or neoplasm. Normal cerebral volume. Calcific embolus to a posterior sylvian M3 branch with developing cytotoxic edema throughout posterior frontal and temporal lobes as well as the adjacent white matter.  CTA NECK  Aortic arch: Standard branching. Imaged portion shows no evidence of aneurysm or dissection. No significant stenosis of the major arch vessel origins. Moderate calcific and soft plaque at the innominate origin and LEFT common carotid origin.  Right carotid system: There is extensive soft plaque and to a lesser degree calcific plaque at the RIGHT ICA origin extending for length of 2 cm cephalad into the proximal RIGHT internal carotid. Stenosis measurements of 2.3 mm proximal and 5.0 mm distal yield a slightly greater than 50% stenosis, non flow reducing, but the irregular appearance of the soft plaque strongly suggests that this vessel could represent the embolic source.  Left carotid system: Moderate calcific and soft plaque without flow reducing stenosis. Luminal diameter of 2.9 mm. Proximal and 4.6 mm distal calculates to less than 50% stenosis. No evidence of dissection, stenosis (50% or greater) or occlusion.  Vertebral arteries: LEFT minimally larger. No evidence of dissection, stenosis (50% or greater) or occlusion.  Nonvascular structures: No adenopathy. Airway midline. Lung apices show no mass or pneumothorax. No worrisome osseous lesions. Cervical spondylosis. Normal thyroid.  CTA HEAD  Anterior circulation: 1 x 4 mm calcific embolus to a RIGHT MCA M3 branch in the sylvian fissure. Distal to this, there is cytotoxic edema with absent perfusion and diminished enhancement. No large vessel occlusion of the proximal RIGHT or LEFT ICA or RIGHT or LEFT MCA M1 segment. Normal anterior cerebrals. No significant stenosis, proximal occlusion, aneurysm, or vascular malformation.  Posterior  circulation: No significant stenosis, proximal occlusion, aneurysm, or vascular malformation.  Venous sinuses: As permitted by contrast timing, patent.  Anatomic variants: None of significance.  Delayed phase: No abnormal intracranial enhancement. No luxury perfusion has developed at this time at the site of infarction.  IMPRESSION: Re-demonstration of acute RIGHT frontotemporal infarction due to a calcific embolus to a RIGHT MCA M3 branch.  Moderately irregular and extensive mixed soft plaque and calcific plaque at the RIGHT internal carotid artery origin likely represents the embolic source. Slightly greater than 50% stenosis is non flow reducing. See discussion above.   Electronically Signed   By: Rolla Flatten M.D.   On: 07/28/2014 11:24   Dg Chest 2 View  07/27/2014   CLINICAL DATA:  Left CVA.  EXAM: CHEST  2 VIEW  COMPARISON:  08/31/2012  FINDINGS: The cardiomediastinal silhouette is unremarkable.  There is no evidence of focal airspace disease, pulmonary edema, suspicious pulmonary nodule/mass, pleural effusion, or pneumothorax. No acute bony abnormalities are identified.  IMPRESSION: No active cardiopulmonary disease.   Electronically Signed   By: Margarette Canada M.D.   On: 07/27/2014 16:43   Ct Head Wo Contrast  07/27/2014   CLINICAL DATA:  64 year old male with acute confusion, left hand and left leg weakness for 1 day. Initial encounter.  EXAM: CT HEAD WITHOUT CONTRAST  TECHNIQUE: Contiguous axial images were obtained from the base of the skull through the vertex without intravenous contrast.  COMPARISON:  None.  FINDINGS: Hypodensity within the right frontoparietal region involves the cortex and compatible with an acute-subacute infarct. A calcification/clot within the right MCA branch supplying this region is noted. There is no evidence of hemorrhage.  No other intracranial abnormalities are identified, including mass lesion, hydrocephalus, extra-axial fluid collection, or midline shift.  Chronic right  maxillary sinus disease noted.  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 No evidence of hemorrhage.  Chronic right maxillary sinusitis.  Critical Value/emergent results were called by telephone at the time of interpretation on 07/27/2014 at 4:31 pm to Dr. Shirlyn Goltz , who verbally acknowledged these results.   Electronically Signed   By: Margarette Canada M.D.   On: 07/27/2014 16:31   Ct Angio Neck W/cm &/or Wo/cm  07/28/2014   CLINICAL DATA:  Sudden onset LEFT-sided weakness beginning 07/17/2014. Initial encounter.  EXAM: CT ANGIOGRAPHY HEAD AND NECK  TECHNIQUE: Multidetector CT imaging of the head and neck was performed using the standard protocol during bolus administration of intravenous contrast. Multiplanar CT image reconstructions and MIPs were obtained to evaluate the vascular anatomy. Carotid stenosis measurements (when applicable) are obtained utilizing NASCET criteria, using the distal internal carotid diameter as the denominator.  CONTRAST:  7mL OMNIPAQUE IOHEXOL 350 MG/ML SOLN  COMPARISON:  CT head 07/27/2014.  MR head 07/27/2014.  FINDINGS: CT HEAD  Calvarium and skull base: No fracture or destructive lesion. Mastoids and middle ears are grossly clear.  Paranasal sinuses: Imaged portions are clear, except for moderate paranasal sinus disease RIGHT maxillary region.  Orbits: Negative.  Brain: No hemorrhage or neoplasm. Normal cerebral volume. Calcific embolus to a posterior sylvian M3 branch with developing cytotoxic edema throughout posterior frontal and temporal lobes as well as the adjacent white matter.  CTA NECK  Aortic arch: Standard branching. Imaged portion shows no evidence of aneurysm or dissection. No significant stenosis of the major arch vessel origins. Moderate calcific and soft plaque at the innominate origin and LEFT common carotid origin.  Right carotid system: There is extensive soft plaque and to  a lesser degree calcific plaque at the RIGHT ICA origin extending for length of 2 cm cephalad into the proximal RIGHT internal carotid. Stenosis measurements of 2.3 mm proximal and 5.0 mm distal yield a slightly greater than 50% stenosis, non flow reducing, but the irregular appearance of the soft plaque strongly suggests that this vessel could represent the embolic source.  Left carotid system: Moderate calcific and soft plaque without flow reducing stenosis. Luminal diameter of 2.9 mm. Proximal and 4.6 mm distal calculates to less than 50% stenosis. No evidence of dissection, stenosis (50% or greater) or occlusion.  Vertebral arteries: LEFT minimally larger. No evidence of dissection, stenosis (50% or greater) or occlusion.  Nonvascular structures: No adenopathy. Airway midline. Lung apices show no mass or pneumothorax. No worrisome osseous lesions. Cervical spondylosis. Normal thyroid.  CTA HEAD  Anterior circulation: 1 x 4 mm calcific embolus to a RIGHT MCA M3 branch in the sylvian fissure. Distal to this, there is cytotoxic edema with absent perfusion and diminished enhancement. No large vessel occlusion of the proximal RIGHT or LEFT ICA or RIGHT or LEFT MCA M1 segment. Normal anterior cerebrals. No  significant stenosis, proximal occlusion, aneurysm, or vascular malformation.  Posterior circulation: No significant stenosis, proximal occlusion, aneurysm, or vascular malformation.  Venous sinuses: As permitted by contrast timing, patent.  Anatomic variants: None of significance.  Delayed phase: No abnormal intracranial enhancement. No luxury perfusion has developed at this time at the site of infarction.  IMPRESSION: Re-demonstration of acute RIGHT frontotemporal infarction due to a calcific embolus to a RIGHT MCA M3 branch.  Moderately irregular and extensive mixed soft plaque and calcific plaque at the RIGHT internal carotid artery origin likely represents the embolic source. Slightly greater than 50% stenosis  is non flow reducing. See discussion above.   Electronically Signed   By: Rolla Flatten M.D.   On: 07/28/2014 11:24   Mr Brain Wo Contrast  07/27/2014   CLINICAL DATA:  64 yo male with acute confusion, LEFT hand and LEFT leg weakness for 1 day. Initial encounter. Stroke risk factors include hypertension.  EXAM: MRI HEAD WITHOUT CONTRAST  TECHNIQUE: Multiplanar, multiecho pulse sequences of the brain and surrounding structures were obtained without intravenous contrast.  COMPARISON:  CT head earlier today.  FINDINGS: Moderately large area of restricted diffusion affecting the RIGHT insula, and posterior limb internal capsule, along with the posterior frontal and temporal lobes representing acute infarction due to RIGHT MCA M3 branch occlusion. Small calcific embolus, responsible for the infarction, is lodged in the posterior aspect RIGHT sylvian fissure (image 14 series 8) as identified more readily on CT.  No acute hemorrhage, mass lesion, hydrocephalus, or extra-axial fluid.  No proximal large vessel occlusion of the internal carotid, middle cerebral M1, or M2 segments. Basilar artery and both vertebral arteries display patent flow voids.  Normal cerebral volume. Mild subcortical and periventricular T2 and FLAIR hyperintensities, likely chronic microvascular ischemic change. Flow voids are maintained in the internal carotid, basilar, and both vertebral arteries. Pituitary, pineal, and cerebellar tonsils unremarkable. No upper cervical lesions. Calvarium intact. Chronic sinus disease in the RIGHT maxillary and RIGHT frontal sinuses without air-fluid level. Negative orbits. No mastoid fluid.  IMPRESSION: Moderately large area of nonhemorrhagic acute infarction affecting the RIGHT hemisphere as described, responsible for LEFT-sided weakness.  Small calcific embolus to an M3 branch of the RIGHT middle cerebral artery is responsible.  Mild small vessel disease.  Chronic sinusitis.   Electronically Signed   By: Rolla Flatten M.D.   On: 07/27/2014 18:21    12-lead ECG sinus rhythm, no afib Telemetry sinus rhythm, no afib   Assessment and Plan 1. Cryptogenic stroke  TEE is negative, we recommend loop recorder insertion to monitor for AF. The indication for loop recorder insertion / monitoring for AF in setting of cryptogenic stroke was discussed with the patient. The loop recorder insertion procedure was reviewed in detail including risks and benefits. These risks include but are not limited to bleeding and infection. The patient expressed verbal understanding and agrees to proceed. The patient was also counseled regarding wound care and device follow-up.  WIll proceed at this time  Anticipate discharge later today Dr Irish Lack to arrange return for cath in 2-3 weeks  Signed, Derric Dealmeida 07/31/2014, 5:51 PM

## 2014-07-31 NOTE — Progress Notes (Signed)
Received consult for possible implantable loop recorder in setting of recent embolic stroke. With newly depressed EF and ongoing work-up, would defer loop implantation until after cath/ischemic evaluation.    Chanetta Marshall, NP 07/31/2014 9:30 AM  EP Attending  Agree with above.   Mikle Bosworth.D.

## 2014-08-01 ENCOUNTER — Encounter (HOSPITAL_COMMUNITY): Payer: Self-pay | Admitting: Internal Medicine

## 2014-08-12 ENCOUNTER — Ambulatory Visit: Payer: 59

## 2014-08-12 ENCOUNTER — Ambulatory Visit: Payer: 59 | Admitting: Occupational Therapy

## 2014-08-21 ENCOUNTER — Encounter: Payer: Self-pay | Admitting: Physician Assistant

## 2014-08-21 ENCOUNTER — Ambulatory Visit (INDEPENDENT_AMBULATORY_CARE_PROVIDER_SITE_OTHER): Payer: 59 | Admitting: Physician Assistant

## 2014-08-21 ENCOUNTER — Ambulatory Visit (INDEPENDENT_AMBULATORY_CARE_PROVIDER_SITE_OTHER): Payer: 59 | Admitting: *Deleted

## 2014-08-21 VITALS — BP 148/82 | HR 71 | Ht 71.0 in | Wt 197.0 lb

## 2014-08-21 DIAGNOSIS — I10 Essential (primary) hypertension: Secondary | ICD-10-CM | POA: Diagnosis not present

## 2014-08-21 DIAGNOSIS — I639 Cerebral infarction, unspecified: Secondary | ICD-10-CM | POA: Diagnosis not present

## 2014-08-21 DIAGNOSIS — E785 Hyperlipidemia, unspecified: Secondary | ICD-10-CM

## 2014-08-21 DIAGNOSIS — R072 Precordial pain: Secondary | ICD-10-CM | POA: Diagnosis not present

## 2014-08-21 DIAGNOSIS — I6523 Occlusion and stenosis of bilateral carotid arteries: Secondary | ICD-10-CM

## 2014-08-21 LAB — MDC_IDC_ENUM_SESS_TYPE_INCLINIC
MDC IDC SESS DTM: 20160413154113
MDC IDC SET ZONE DETECTION INTERVAL: 360 ms
Zone Setting Detection Interval: 2000 ms
Zone Setting Detection Interval: 3000 ms

## 2014-08-21 MED ORDER — METOPROLOL SUCCINATE ER 25 MG PO TB24: 25.0000 mg | ORAL_TABLET | Freq: Every day | ORAL | Status: DC

## 2014-08-21 MED ORDER — NITROGLYCERIN 0.4 MG SL SUBL: 0.4000 mg | SUBLINGUAL_TABLET | SUBLINGUAL | Status: DC | PRN

## 2014-08-21 NOTE — Progress Notes (Signed)
Cardiology Office Note   Date:  08/21/2014   ID:  FELIS QUILLIN, DOB 1950-11-07, MRN [id number]  PCP:   Melinda Crutch, MD  Cardiologist:  Dr. Casandra Doffing     Chief Complaint  Patient presents with  . Hospitalization Follow-up    DC 07/31/14  . Chest Pain  . LV Dysfunction     History of Present Illness: Daniel [name] is a 64 y.o. male with a hx of tobacco abuse, HTN. 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  Echocardiogram demonstrated reduced LV function with an EF of 40-45%. Cardiac enzymes remained negative.  He did have symptoms of exertional chest pain. Given recent CVA, it was felt that the patient should avoid cardiac catheterization for at least 2 weeks post CVA . He would ultimately need cardiac catheterization for further evaluation.  Because of his stroke, he was seen by Dr. Rayann Heman and underwent implantation of implantable loop recorder.  TEE was performed and demonstrated EF of 55% (appeared better than transthoracic echocardiogram).  He is overall doing well.  He has not been doing anything exertional since DC.  Denies any further chest pain.  He has noted some DOE.  He has been walking in his neighborhood.  He is NYHA 2-2b.  Denies orthopnea, PND, edema.  No syncope.  He got the flu after DC and was treated with Tamiflu.  Still has some residual hand and foot numbness on the L and some pain in his hand occasionally.     Studies/Reports Reviewed Today:  Echo 07/29/14 - mild LVH. EF 40% to 45%. grade 1 diastolic dysfunction). - Left atrium: The atrium was mildly dilated.  TEE 07/31/14 EF 55% Normal RV size and function Mild LAE No LAA clot Negative bubble study  Neck CTA 07/28/14 Mild bilateral ICA calcific plaque (nothing > 50%)  Past Medical History  Diagnosis Date  . Essential hypertension   . CVA (cerebral infarction)     Past Surgical History  Procedure Laterality Date  . Circumcision    . Loop recorder implant N/A 07/31/2014    Procedure:  LOOP RECORDER IMPLANT;  Surgeon: [name], MD;  Location: [facility] CATH LAB;  Service: Cardiovascular;  Laterality: N/A;  . Tee without cardioversion N/A 07/31/2014    Procedure: TRANSESOPHAGEAL ECHOCARDIOGRAM (TEE);  Surgeon: Larey Dresser, MD;  Location: [facility] ENDOSCOPY;  Service: Cardiovascular;  Laterality: N/A;     Current Outpatient Prescriptions  Medication Sig Dispense Refill  . aspirin EC 325 MG EC tablet Take 1 tablet (325 mg total) by mouth daily. 30 tablet 0  . atorvastatin (LIPITOR) 20 MG tablet Take 1 tablet (20 mg total) by mouth daily at 6 PM. 30 tablet 0  . buPROPion (WELLBUTRIN XL) 150 MG 24 hr tablet Take 150 mg by mouth every morning.  1  . lisinopril (PRINIVIL,ZESTRIL) 20 MG tablet Take 1 tablet (20 mg total) by mouth daily. 30 tablet 0   No current facility-administered medications for this visit.    Allergies:   Review of patient's allergies indicates no known allergies.    Social History:  The patient  reports that he has been smoking Cigarettes.  He has been smoking about 0.25 packs per day. He has never used smokeless tobacco. He reports that he does not drink alcohol or use illicit drugs.   Family History:  The patient's family history includes Heart attack in his brother, father, and mother; Heart failure in his brother, father, and paternal uncle; Hypertension in his brother, father,  mother, and paternal uncle. There is no history of Stroke.    ROS:   Please see the history of present illness.   Review of Systems  HENT: Positive for headaches.   Cardiovascular: Positive for dyspnea on exertion.  Gastrointestinal: Positive for constipation, nausea and vomiting.  Psychiatric/Behavioral: Positive for depression.  All other systems reviewed and are negative.    PHYSICAL EXAM: VS:  BP 148/82 mmHg  Pulse 71  Ht 5\' 11"  (1.803 m)  Wt 197 lb (89.359 kg)  BMI 27.49 kg/m2    Wt Readings from Last 3 Encounters:  08/21/14 197 lb (89.359 kg)  07/28/14 192 lb 15.9  oz (87.54 kg)  07/27/14 193 lb (87.544 kg)     GEN: Well nourished, well developed, in no acute distress HEENT: normal Neck: no JVD, no masses Cardiac:  Normal S1/S2, RRR; no murmur ,  no rubs or gallops, no edema  Respiratory:  clear to auscultation bilaterally, no wheezing, rhonchi or rales. GI: soft, nontender, nondistended, + BS MS: no deformity or atrophy Skin: warm and dry  Neuro:  CNs II-XII intact, Strength and sensation are intact Psych: Normal affect   EKG:  EKG is ordered today.  It demonstrates:   NSR, HR 71, normal axis, nonspecific ST-T wave changes, QTc 422ms    Recent Labs: 07/27/2014: ALT 32; BUN 17; Creatinine 0.90; Hemoglobin 17.0; Platelets 224; Potassium 4.0; Sodium 138    Lipid Panel    Component Value Date/Time   CHOL 185 07/28/2014 0744   TRIG 119 07/28/2014 0744   HDL 32* 07/28/2014 0744   CHOLHDL 5.8 07/28/2014 0744   VLDL 24 07/28/2014 0744   LDLCALC 129* 07/28/2014 0744      ASSESSMENT AND PLAN:  Precordial pain He has not had recurrent symptoms since DC.  He has a lot of risk factors including known vascular disease, HTN, HL, tobacco abuse and FHx of CAD.  Reviewed with Dr. [name] today (who also saw him in the hospital).  As he remains chest pain-free, we thought it was reasonable to continue medical therapy for now and allow him to get further out from his stroke before we proceed with cardiac catheterization. I discussed this with the patient. He agrees with this plan. I will place him on beta blocker therapy to further advances antianginal therapy.  -  Start Toprol-XL 25 mg daily  Essential hypertension BP still elevated.  Continue ACE inhibitor.  Add Toprol-XL 25 mg Once daily. Request recent labs done by PCP.  Dyslipidemia Continue statin.  Cerebral infarction due to unspecified mechanism ILR wound checked by device clinic today.  No arrhythmias noted.  Continue ASA, statin, ACE inhibitor.  FU with Neuro and PT as planned.    Carotid stenosis, bilateral  No high grade plaque on neck CTA.  Consider repeat carotid US in 1 year.   Current medicines are reviewed at length with the patient today.  The patient does not have concerns regarding medicines.  The following changes have been made:    Start Toprol-XL 25 mg QD  Rx for prn NTG given  Labs/ tests ordered today include:   Orders Placed This Encounter  Procedures  . EKG 12-Lead    Disposition:   FU with me or Dr. Casandra Doffing in 3-4 weeks.    Signed, Versie Starks, MHS 08/21/2014 3:35 PM    [facility] [place], [place], [place]  [address] Phone: [phone]; Fax: [phone]

## 2014-08-21 NOTE — Progress Notes (Signed)
Loop wound check in clinic. Steri strips removed, wound well healed with no redness, drainage or edema. Battery status: good. R waves 0.3-0.77mV. Implanted for cryptogenic stroke. Pt with no episodes.  Monthly carelink summary reports, follow up with JA PRN.

## 2014-08-21 NOTE — Patient Instructions (Signed)
Medication Instructions:  1. START TOPROL XL 25 MG 1 TAB DAILY; RX SENT IN 2. AN RX FOR NTG HAS BEEN SENT IN AND YOU HAVE BEEN ADVISED AS TO WHEN AND HOW TO USE NTG  Labwork: NONE  Testing/Procedures: NONE  Follow-Up: 09/18/14 @ 3:20 WITH SCOTT WEAVER, PAC SAME DAY DR. Irish Lack IS IN THE OFFICE   Any Other Special Instructions Will Be Listed Below (If Applicable).

## 2014-08-29 ENCOUNTER — Encounter: Payer: Self-pay | Admitting: Cardiology

## 2014-08-30 ENCOUNTER — Ambulatory Visit (INDEPENDENT_AMBULATORY_CARE_PROVIDER_SITE_OTHER): Payer: 59 | Admitting: *Deleted

## 2014-08-30 DIAGNOSIS — I639 Cerebral infarction, unspecified: Secondary | ICD-10-CM

## 2014-09-02 ENCOUNTER — Other Ambulatory Visit: Payer: Self-pay | Admitting: Internal Medicine

## 2014-09-03 ENCOUNTER — Encounter: Payer: Self-pay | Admitting: Internal Medicine

## 2014-09-03 ENCOUNTER — Ambulatory Visit: Payer: 59 | Attending: Internal Medicine | Admitting: Occupational Therapy

## 2014-09-03 ENCOUNTER — Ambulatory Visit: Payer: 59 | Admitting: Rehabilitative and Restorative Service Providers"

## 2014-09-03 DIAGNOSIS — I69398 Other sequelae of cerebral infarction: Secondary | ICD-10-CM | POA: Diagnosis not present

## 2014-09-03 DIAGNOSIS — IMO0002 Reserved for concepts with insufficient information to code with codable children: Secondary | ICD-10-CM

## 2014-09-03 DIAGNOSIS — R269 Unspecified abnormalities of gait and mobility: Secondary | ICD-10-CM

## 2014-09-03 DIAGNOSIS — R4189 Other symptoms and signs involving cognitive functions and awareness: Secondary | ICD-10-CM | POA: Diagnosis present

## 2014-09-03 DIAGNOSIS — R208 Other disturbances of skin sensation: Secondary | ICD-10-CM | POA: Diagnosis not present

## 2014-09-03 DIAGNOSIS — R279 Unspecified lack of coordination: Secondary | ICD-10-CM | POA: Insufficient documentation

## 2014-09-03 DIAGNOSIS — I639 Cerebral infarction, unspecified: Secondary | ICD-10-CM

## 2014-09-03 DIAGNOSIS — I6931 Cognitive deficits following cerebral infarction: Secondary | ICD-10-CM | POA: Insufficient documentation

## 2014-09-03 NOTE — Therapy (Signed)
[facility] [address] [place], [place], [address] Phone: [phone]   Fax:  [phone]  Occupational Therapy Evaluation  Patient Details  Name: Daniel [name] MRN: [id number] Date of Birth: August 23, 1950 Referring Provider:  Lona Kettle, MD  Encounter Date: 09/03/2014      OT End of Session - 09/03/14 1253    Visit Number 1   Number of Visits 17   Date for OT Re-Evaluation 11/02/14   Authorization Type [employer]   Authorization - Visit Number 1   Authorization - Number of Visits 7   OT Start Time 5400   OT Stop Time 1245   OT Time Calculation (min) 57 min   Activity Tolerance Patient tolerated treatment well      Past Medical History  Diagnosis Date  . Essential hypertension   . CVA (cerebral infarction)     Past Surgical History  Procedure Laterality Date  . Circumcision    . Loop recorder implant N/A 07/31/2014    Procedure: LOOP RECORDER IMPLANT;  Surgeon: [name], MD;  Location: [facility] CATH LAB;  Service: Cardiovascular;  Laterality: N/A;  . Tee without cardioversion N/A 07/31/2014    Procedure: TRANSESOPHAGEAL ECHOCARDIOGRAM (TEE);  Surgeon: Larey Dresser, MD;  Location: [facility];  Service: Cardiovascular;  Laterality: N/A;    There were no vitals filed for this visit.  Visit Diagnosis:  Impaired cognition - Plan: Ot plan of care cert/re-cert  Decreased sensation of hand and arm - Plan: Ot plan of care cert/re-cert  Lack of coordination due to stroke - Plan: Ot plan of care cert/re-cert      Subjective Assessment - 09/03/14 1152    Pertinent History internal defibrillator   Patient Stated Goals "To get better with this business operations b/c I can get a little confused"   Currently in Pain? No/denies  NOT currently, but reports occasional sharp pain in Lt hand           [facility] OT Assessment - 09/03/14 1156    Assessment   Diagnosis CVA   Onset Date 07/27/14   Assessment D/C from  hospital on 07/31/14   Prior Therapy none   Precautions   Precautions --  defibrillator   Balance Screen   Has the patient fallen in the past 6 months No   Has the patient had a decrease in activity level because of a fear of falling?  No   Is the patient reluctant to leave their home because of a fear of falling?  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 Distant supervision cooking, however pt has burned Lt hand and decreased ability to multi-task, therefore question safety with this. Pt reports difficulty/frusteration with making bed.  Pt also has wife double checking bookkeeping for business. Pt's wife/son are assisting with business prn   Written Expression   Dominant Hand Right   Handwriting 100% legible   Vision - History   Baseline Vision --  denies change from  CVA, but reports needing glasses   Cognition   Memory Impaired   Memory Impairment Decreased short term memory   Problem Solving Impaired   Executive Function Sequencing;Decision Making;Organizing;Self Correcting   Observation/Other Assessments   Observations spells "world" backwards with 1 error. Subtracting by 7's with max errors in calculations (but working memory appeared intact), immediate recall 3/3 words, delayed recall 1/3.    Sensation   Light Touch Appears Intact   Hot/Cold Impaired Detail   Hot/Cold Impaired Details Impaired LUE  Unable to detect when buring hand   Additional Comments 2 pt discrimination  impaired. Localization intact   Coordination   9 Hole Peg Test Right;Left   Right 9 Hole Peg Test 28.31 sec   Left 9 Hole Peg Test 32.62 sec   Box and Blocks Lt = 42    ROM / Strength   AROM / PROM / Strength AROM   AROM   Overall AROM Comments BUE AROM WNL's   Strength   Overall Strength Comments MMT grossly 5/5 BUE's   Hand Function   Right Hand Grip (lbs) 65 lbs, 70 lbs   Left Hand Grip (lbs) 75 lbs, 65 lbs                  OT Treatments/Exercises (OP) - 09/03/14 1309    ADLs   ADL Comments Long discussion re: frusteration level and possible anxiety/ depression. Pt also expressed concerns with medications. Pt was encouraged to discuss all concerns with MD. Pt also sees psychologist in 2 weeks.                  OT Short Term Goals - 09/03/14 1259    OT SHORT TERM GOAL #1   Title Pt to verbalize safety considerations for decreased sensation LUE (DUE 10/03/14)   Time 4   Period Weeks   Status New   OT SHORT TERM GOAL #2   Title Pt to report greater ease with hooking pant buttons and smaller buttons w/ A/E prn   Time 4   Period Weeks   Status New   OT SHORT TERM GOAL #3   Title Pt to report greater ease with making bed and simple meal prep   Time 4   Period Weeks   Status New   OT SHORT TERM GOAL #4   Title Pt to perform simple money exchange and Air traffic controller with 90% or greater accuracy   Time 4   Period Weeks   Status New   OT SHORT TERM GOAL #5   Title Pt to perform simple organization tasks without errors   Time 4   Period Weeks   Status New           OT Long Term Goals - 09/03/14 1303    OT LONG TERM GOAL #1   Title Pt to perform complex meal prep (2-3 dishes) safely at mod I level (DUE 11/02/14)   Time 8   Period Weeks   Status New   OT LONG TERM GOAL #2   Title Pt to perform complex financial management tasks with only minimal errors in prep for work tasks   Time 8   Period Weeks   Status New   OT LONG TERM GOAL #3   Title  Pt to perform complex organizational tasks with adaptations prn without errors in prep for work tasks   Time 8   Period Weeks   Status New   OT LONG TERM GOAL #4  Title Pt to independently utilize memory compensatory strategies    Time 8   Period Weeks   Status New               Plan - 09/03/14 1255    Clinical Impression Statement Pt is a 64 y.o. male who presents to outpatient rehab s/p Rt CVA with decreased executive functioning skills and cognitive deficits. Pt also reports increased frusteration with tasks.    Pt will benefit from skilled therapeutic intervention in order to improve on the following deficits (Retired) Decreased cognition;Decreased strength;Impaired UE functional use;Decreased knowledge of precautions;Decreased activity tolerance;Impaired sensation;Decreased coordination   Rehab Potential Good   Clinical Impairments Affecting Rehab Potential ? frusteration level   OT Frequency 2x / week   OT Duration 8 weeks  plus evaluation   OT Treatment/Interventions Self-care/ADL training;DME and/or AE instruction;Patient/family education;Therapeutic exercises;Therapeutic activities;Cognitive remediation/compensation   Plan simple organization tasks, money exchange worksheet   Consulted and Agree with Plan of Care Patient        Problem List Patient Active Problem List   Diagnosis Date Noted  . Precordial pain   . Benign essential HTN   . Stroke   . Tobacco abuse   . Dyslipidemia   . CVA (cerebral infarction) 07/27/2014  . Essential hypertension     Carey Bullocks, OTR/L 09/03/2014, 1:20 PM  [facility] [address] [place], [place], [address] Phone: [phone]   Fax:  [phone]

## 2014-09-03 NOTE — Therapy (Signed)
[facility] [address] [place], [place], [address] Phone: [phone]   Fax:  [phone]  Physical Therapy Evaluation  Patient Details  Name: Daniel [name] MRN: [id number] Date of Birth: [birth year] Referring Provider:  Lona Kettle, MD  Encounter Date: 09/03/2014      PT End of Session - 09/03/14 1920    Visit Number 1   Number of Visits 1   PT Start Time 1105   PT Stop Time 1140   PT Time Calculation (min) 35 min   Activity Tolerance Patient tolerated treatment well   Behavior During Therapy [facility] for tasks assessed/performed      Past Medical History  Diagnosis Date  . Essential hypertension   . CVA (cerebral infarction)     Past Surgical History  Procedure Laterality Date  . Circumcision    . Loop recorder implant N/A 07/31/2014    Procedure: LOOP RECORDER IMPLANT;  Surgeon: [name], MD;  Location: [facility] CATH LAB;  Service: Cardiovascular;  Laterality: N/A;  . Tee without cardioversion N/A 07/31/2014    Procedure: TRANSESOPHAGEAL ECHOCARDIOGRAM (TEE);  Surgeon: Larey Dresser, MD;  [facility];  Service: Cardiovascular;  Laterality: N/A;    There were no vitals filed for this visit.  Visit Diagnosis:  CVA (cerebral vascular accident)  Abnormality of gait      Subjective Assessment - 09/03/14 1108    Subjective The patient reports he continues with L hand numbness since stroke.  The patient reports he has occasional episodes of L LE sensory change.  He feels limited since the CVA and feels some of this is psychological.  He is scheduled to see his primary MD this Friday.   Pertinent History Recent defibrillator placement.    Patient Stated Goals Return to prior functioning (from CVA)   Currently in Pain? No/denies            [facility] PT Assessment - 09/03/14 1116    Assessment   Medical Diagnosis CVA-R MCA   Onset Date 08/12/14   Prior Therapy acute   Precautions   Precautions  Fall  Pt reports no falls since d/c home   Balance Screen   Has the patient fallen in the past 6 months No   Has the patient had a decrease in activity level because of a fear of falling?  Yes  pt has decline in status due to recent CVA   Is the patient reluctant to leave their home because of a fear of falling?  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   Observation/Other Assessments   Focus on Therapeutic Outcomes (FOTO)  67%   Other Surveys  --  SIS: mobility 97.2%   Sensation   Light Touch Impaired by gross assessment  lower leg   Coordination   Heel Shin Test --  WNLs,  symmetrical bilaterally   Posture/Postural Control   Posture/Postural Control No significant limitations   ROM / Strength   AROM / PROM / Strength AROM;Strength   AROM   Overall AROM  Within functional limits for tasks performed   Strength   Overall Strength Within functional limits for tasks performed   Overall Strength Comments --  grossly 5/5 for bilat hip flex, knee flex/ext, ankle DF   Bed Mobility   Bed Mobility --  Independent with sit<>supine and rolling   Transfers   Transfers --  Indep with sit<>stand without UE support   Ambulation/Gait   Ambulation/Gait Yes   Ambulation/Gait Assistance 7: Independent   Ambulation Distance (Feet) 200 Feet   Gait Pattern Within  Functional Limits   Ambulation Surface Level   Gait velocity 3.90 ft/sec   Stairs Yes   Stairs Assistance 7: Independent   Stair Management Technique No rails;Alternating pattern   Number of Stairs --  4   Standardized Balance Assessment   Standardized Balance Assessment Berg Balance Test;Dynamic Gait Index   Berg Balance Test   Sit to Stand Able to stand without using hands and stabilize independently   Standing Unsupported Able to stand safely 2 minutes   Sitting with Back Unsupported but Feet Supported on Floor or Stool Able to sit safely and securely 2 minutes   Stand to Sit Sits safely with minimal use of hands   Transfers Able to transfer safely, minor use of hands   Standing Unsupported with Eyes Closed Able to stand 10 seconds safely   Standing Ubsupported with Feet Together Able to place feet together independently and stand 1 minute safely   From Standing, Reach Forward with Outstretched Arm Can reach forward >12 cm safely (5")   From Standing Position, Pick up Object from Floor Able to pick up shoe safely and easily   From Standing Position, Turn to Look Behind Over each Shoulder Looks behind from both sides and weight shifts well   Turn 360 Degrees Able to turn 360 degrees safely in 4 seconds or less   Standing Unsupported, Alternately Place Feet on Step/Stool Able to stand independently and safely and complete 8 steps in 20 seconds   Standing Unsupported, One Foot in Front Able to place foot tandem independently and hold 30 seconds   Standing on One Leg Able to lift leg independently and hold > 10 seconds   Total Score 55   Dynamic Gait Index   Level Surface Normal   Change in Gait Speed Normal   Gait with Horizontal Head Turns Normal   Gait with Vertical Head Turns Normal   Gait and Pivot Turn Normal   Step Over Obstacle Normal   Step Around Obstacles Normal   Steps Normal   Total Score 24               Plan - 09/03/14 1920    Clinical Impression Statement  The patient is a 64 yo male s/p CVA presenting to outpatient physical therapy today scoring 55/56 on Berg balance test and 24/24 on DGI.  He reports his deficits involve divided attention, executive functioning, and L UE use.  These deficits to be further assessed by OT.   PT Next Visit Plan No further PT indicated.   Consulted and Agree with Plan of Care Patient         Problem List Patient Active Problem List   Diagnosis Date Noted  . Precordial pain   . Benign essential HTN   .  Stroke   . Tobacco abuse   . Dyslipidemia   . CVA (cerebral infarction) 07/27/2014  . Essential hypertension     Thank you for the referral of this patient.   [place], [place] 09/03/2014, 7:24 PM  [facility] [address] [place], [place], [address] Phone: [phone]   Fax:  [phone]

## 2014-09-04 NOTE — Progress Notes (Signed)
Loop recorder 

## 2014-09-05 ENCOUNTER — Other Ambulatory Visit: Payer: Self-pay | Admitting: Internal Medicine

## 2014-09-10 ENCOUNTER — Telehealth: Payer: Self-pay

## 2014-09-10 NOTE — Telephone Encounter (Signed)
Called patient and left him a message stating to call the office back. Patient is scheduled with Dr.Sethi 10-16-2014. Dr.Sethi will not be here. Please when patient call's back reschedule his apt. Thanks Hinton Dyer.

## 2014-09-12 ENCOUNTER — Encounter: Payer: Self-pay | Admitting: Occupational Therapy

## 2014-09-12 ENCOUNTER — Ambulatory Visit: Payer: 59 | Attending: Neurology | Admitting: Occupational Therapy

## 2014-09-12 DIAGNOSIS — R4189 Other symptoms and signs involving cognitive functions and awareness: Secondary | ICD-10-CM | POA: Diagnosis present

## 2014-09-12 DIAGNOSIS — I6931 Cognitive deficits following cerebral infarction: Secondary | ICD-10-CM | POA: Diagnosis not present

## 2014-09-12 DIAGNOSIS — R208 Other disturbances of skin sensation: Secondary | ICD-10-CM | POA: Insufficient documentation

## 2014-09-12 DIAGNOSIS — I69398 Other sequelae of cerebral infarction: Secondary | ICD-10-CM | POA: Diagnosis not present

## 2014-09-12 DIAGNOSIS — R279 Unspecified lack of coordination: Secondary | ICD-10-CM | POA: Diagnosis not present

## 2014-09-12 NOTE — Therapy (Signed)
[facility] [address] [place], [place], [address] Phone: [phone]   Fax:  [phone]  Occupational Therapy Treatment  Patient Details  Name: Daniel [name] MRN: [id number] Date of Birth: 05/07/1951 Referring Provider:  Lona Kettle, MD  Encounter Date: 09/12/2014      OT End of Session - 09/12/14 1530    Visit Number 2   Number of Visits 17   Date for OT Re-Evaluation 11/02/14   Authorization Type [employer]   Authorization - Visit Number 2   Authorization - Number of Visits 77   OT Start Time 1410   OT Stop Time 1450   OT Time Calculation (min) 40 min   Activity Tolerance Patient tolerated treatment well      Past Medical History  Diagnosis Date  . Essential hypertension   . CVA (cerebral infarction)     Past Surgical History  Procedure Laterality Date  . Circumcision    . Loop recorder implant N/A 07/31/2014    Procedure: LOOP RECORDER IMPLANT;  Surgeon: [name], MD;  Location: [facility] CATH LAB;  Service: Cardiovascular;  Laterality: N/A;  . Tee without cardioversion N/A 07/31/2014    Procedure: TRANSESOPHAGEAL ECHOCARDIOGRAM (TEE);  Surgeon: Larey Dresser, MD;  Location: [place];  Service: Cardiovascular;  Laterality: N/A;    There were no vitals filed for this visit.  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 No/denies                      OT Treatments/Exercises (OP) - 09/12/14 0001    ADLs   ADL Comments Pt perseverated on medical conditions with heart as well as medications. Therapist spent 15 minutes discussing with him and the importance of discussing these concerns with MD so that we can  focus on therapy needs and cognitive skill development   Cognitive Exercises   Financial Management Change-Making Money exchange worksheet with max difficulty and errors. Pt only completed 5 problems in 25 minutes and only 2 were correct.                   OT Short Term Goals - 09/03/14 1259    OT SHORT TERM GOAL #1   Title Pt to verbalize safety considerations for decreased sensation LUE (DUE 10/03/14)   Time 4   Period Weeks   Status New   OT SHORT TERM GOAL #2   Title Pt to report greater ease with hooking pant buttons and smaller buttons w/ A/E prn   Time 4   Period Weeks   Status New   OT SHORT TERM GOAL #3   Title Pt to report greater ease with making bed and simple meal prep   Time 4   Period Weeks   Status New   OT SHORT TERM GOAL #4   Title Pt to perform simple money exchange and financial mnmgt with 90% or greater accuracy   Time 4   Period Weeks   Status New   OT SHORT TERM GOAL #5   Title Pt to perform simple organization tasks without errors   Time 4   Period Weeks  Status New           OT Long Term Goals - 09/03/14 1303    OT LONG TERM GOAL #1   Title Pt to perform complex meal prep (2-3 dishes) safely at mod I level (DUE 11/02/14)   Time 8   Period Weeks   Status New   OT LONG TERM GOAL #2   Title Pt to perform complex financial management tasks with only minimal errors in prep for work tasks   Time 8   Period Weeks   Status New   OT LONG TERM GOAL #3   Title Pt to perform complex organizational tasks with adaptations prn without errors in prep for work tasks   Time 8   Period Weeks   Status New   OT LONG TERM GOAL #4   Title Pt to independently utilize memory compensatory strategies    Time 8   Period Weeks   Status New               Plan - 09/12/14 1530    Clinical Impression Statement Pt with increased perseveration on medical concerns and medications. Pt with max difficulty with money exchange   Plan simple math, money  exchange, simple organization task        Problem List Patient Active Problem List   Diagnosis Date Noted  . Precordial pain   . Benign essential HTN   . Stroke   . Tobacco abuse   . Dyslipidemia   . CVA (cerebral infarction) 07/27/2014  . Essential hypertension     Carey Bullocks, OTR/L 09/12/2014, 3:33 PM  [facility] [address] [place], [place], [address] Phone: [phone]   Fax:  [phone]

## 2014-09-16 ENCOUNTER — Ambulatory Visit: Payer: 59 | Admitting: Occupational Therapy

## 2014-09-18 ENCOUNTER — Encounter: Payer: 59 | Admitting: Occupational Therapy

## 2014-09-18 ENCOUNTER — Encounter: Payer: Self-pay | Admitting: Physician Assistant

## 2014-09-18 ENCOUNTER — Encounter: Payer: Self-pay | Admitting: *Deleted

## 2014-09-18 ENCOUNTER — Ambulatory Visit (INDEPENDENT_AMBULATORY_CARE_PROVIDER_SITE_OTHER): Payer: 59 | Admitting: Physician Assistant

## 2014-09-18 VITALS — BP 160/80 | HR 65 | Ht 71.0 in | Wt 198.0 lb

## 2014-09-18 DIAGNOSIS — E785 Hyperlipidemia, unspecified: Secondary | ICD-10-CM

## 2014-09-18 DIAGNOSIS — I6523 Occlusion and stenosis of bilateral carotid arteries: Secondary | ICD-10-CM

## 2014-09-18 DIAGNOSIS — I639 Cerebral infarction, unspecified: Secondary | ICD-10-CM | POA: Diagnosis not present

## 2014-09-18 DIAGNOSIS — I10 Essential (primary) hypertension: Secondary | ICD-10-CM | POA: Diagnosis not present

## 2014-09-18 DIAGNOSIS — R072 Precordial pain: Secondary | ICD-10-CM

## 2014-09-18 MED ORDER — ISOSORBIDE MONONITRATE ER 30 MG PO TB24
15.0000 mg | ORAL_TABLET | Freq: Every day | ORAL | Status: DC
Start: 2014-09-18 — End: 2014-09-26

## 2014-09-18 NOTE — Progress Notes (Signed)
Cardiology Office Note   Date:  09/18/2014   ID:  Daniel [name], DOB 11/23/1950, MRN [id number]  PCP:   Melinda Crutch, MD  Cardiologist:  Dr. Casandra Doffing     Chief Complaint  Patient presents with  . Chest Pain    Follow-up     History of Present Illness: Daniel [name] is a 64 y.o. male with a hx of tobacco abuse, HTN. 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  Echocardiogram demonstrated reduced LV function with an EF of 40-45%. Cardiac enzymes remained negative.  He did have symptoms of exertional chest pain. Given recent CVA, it was felt that the patient should avoid cardiac catheterization for at least 2 weeks post CVA . He would ultimately need cardiac catheterization for further evaluation.  Because of his stroke, he was seen by Dr. Rayann Heman and underwent implantation of implantable loop recorder.  TEE was performed and demonstrated EF of 55% (appeared better than transthoracic echocardiogram).  I saw him last month in FU.  He was overall doing well.  I reviewed his case with Dr. [name] who followed the patient in the hospital.  We decided to hold off on proceeding with cardiac cath as he was not having any significant angina. I started beta-blocker with Toprol-XL.  He returns for FU.  Unfortunately, over the past several weeks, he has developed worsening exertional chest tightness.  He is getting symptoms with minimal activity.  His PCP recently gave him prn NTG.  He has not used it.  He has assoc dyspnea with his chest pain.  No radiation or assoc nausea or diaphoresis.  He still has a lot of L hand symptoms.  He is currently going to PT/OT.     Studies/Reports Reviewed Today:  Echo 07/29/14 - mild LVH. EF 40% to 45%. grade 1 diastolic dysfunction). - Left atrium: The atrium was mildly dilated.  TEE 07/31/14 EF 55% Normal RV size and function Mild LAE No LAA clot Negative bubble study  Neck CTA 07/28/14 Mild bilateral ICA calcific plaque (nothing >  50%)   Past Medical History  Diagnosis Date  . Essential hypertension   . CVA (cerebral infarction)     07/2014  . HLD (hyperlipidemia)   . Hx of echocardiogram     a.  Echo 3/16:  mild LVH, EF 40-45%, Gr 1 DD, mild LAE;  b.  TEE 3/16:  EF 55%, mild LAE, no LAA clot, neg bubble study  . Carotid stenosis     a. Neck CTA 3/16:  mild bilat ICA calcific plaque (nothing > 50%)    Past Surgical History  Procedure Laterality Date  . Circumcision    . Loop recorder implant N/A 07/31/2014    Procedure: LOOP RECORDER IMPLANT;  Surgeon: [name], MD;  Location: [facility] CATH LAB;  Service: Cardiovascular;  Laterality: N/A;  . Tee without cardioversion N/A 07/31/2014    Procedure: TRANSESOPHAGEAL ECHOCARDIOGRAM (TEE);  Surgeon: Larey Dresser, MD;  Location: [facility] ENDOSCOPY;  Service: Cardiovascular;  Laterality: N/A;     Current Outpatient Prescriptions  Medication Sig Dispense Refill  . aspirin EC 325 MG EC tablet Take 1 tablet (325 mg total) by mouth daily. 30 tablet 0  . atorvastatin (LIPITOR) 20 MG tablet Take 1 tablet (20 mg total) by mouth daily at 6 PM. 30 tablet 0  . citalopram (CELEXA) 10 MG tablet Take 10 mg by mouth daily.   1  . lisinopril (PRINIVIL,ZESTRIL) 20 MG tablet Take 1 tablet (20  mg total) by mouth daily. 30 tablet 0  . metoprolol succinate (TOPROL-XL) 25 MG 24 hr tablet Take 1 tablet (25 mg total) by mouth daily. 30 tablet 11  . nitroGLYCERIN (NITROSTAT) 0.4 MG SL tablet Place 1 tablet (0.4 mg total) under the tongue every 5 (five) minutes as needed. 25 tablet 3  . isosorbide mononitrate (IMDUR) 30 MG 24 hr tablet Take 0.5 tablets (15 mg total) by mouth daily. 30 tablet 6   No current facility-administered medications for this visit.    Allergies:   Review of patient's allergies indicates no known allergies.    Social History:  The patient  reports that he has been smoking Cigarettes.  He has been smoking about 0.25 packs per day. He has never used smokeless tobacco. He  reports that he does not drink alcohol or use illicit drugs.   Family History:  The patient's family history includes Heart attack in his brother, father, and mother; Heart failure in his brother, father, and paternal uncle; Hypertension in his brother, father, mother, and paternal uncle. There is no history of Stroke.    ROS:   Please see the history of present illness.   Review of Systems  Constitution: Positive for chills.  HENT: Positive for headaches.   Cardiovascular: Positive for chest pain, dyspnea on exertion and orthopnea.  Respiratory: Positive for cough.   Gastrointestinal: Positive for constipation.  Psychiatric/Behavioral: Positive for depression. The patient is nervous/anxious.   All other systems reviewed and are negative.     PHYSICAL EXAM: VS:  BP 160/80 mmHg  Pulse 65  Ht 5\' 11"  (1.803 m)  Wt 198 lb (89.812 kg)  BMI 27.63 kg/m2    Wt Readings from Last 3 Encounters:  09/18/14 198 lb (89.812 kg)  08/21/14 197 lb (89.359 kg)  07/28/14 192 lb 15.9 oz (87.54 kg)     GEN: Well nourished, well developed, in no acute distress HEENT: normal Neck: no JVD, no masses Cardiac:  Normal S1/S2, RRR; no murmur ,  no rubs or gallops,  no edema  Respiratory:  clear to auscultation bilaterally, no wheezing, rhonchi or rales. GI: soft, nontender, nondistended, + BS MS: no deformity or atrophy Skin: warm and dry  Neuro:  CNs II-XII intact, Strength and sensation are intact Psych: Normal affect   EKG:  EKG is ordered today.  It demonstrates:  NSR, HR 65, inc RBBB, NSSTTW changes, no change from prior tracing   Recent Labs: 07/27/2014: ALT 32; BUN 17; Creatinine 0.90; Hemoglobin 17.0; Platelets 224; Potassium 4.0; Sodium 138    Lipid Panel    Component Value Date/Time   CHOL 185 07/28/2014 0744   TRIG 119 07/28/2014 0744   HDL 32* 07/28/2014 0744   CHOLHDL 5.8 07/28/2014 0744   VLDL 24 07/28/2014 0744   LDLCALC 129* 07/28/2014 0744      ASSESSMENT AND  PLAN:  Precordial pain He has worsening chest pain with minimal activity.  His symptoms are c/w CCS Class 3 angina.  He has a lot of risk factors including known vascular disease, HTN, HL, tobacco abuse and FHx of CAD.  I have recommended that we proceed with cardiac catheterization.  I have also reviewed this with Dr. Casandra Doffing who agrees.  Risks and benefits of cardiac catheterization have been discussed with the patient.  These include bleeding, infection, kidney damage, stroke, heart attack, death.  The patient understands these risks and is willing to proceed.  Continue ASA, statin, beta-blocker.  I will star Isosorbide 15  mg QD.  He does not take PDE-5 inhibitors.  He knows to go to the ED if his symptoms worsen.  Essential hypertension BP elevated today.  He forgot to take his medications this AM.  Continue to monitor.   Dyslipidemia Continue statin.  Cerebral infarction due to unspecified mechanism ILR is followed by EP.  Continue ASA, statin, ACE inhibitor.  FU with Neuro and PT as planned.   Carotid stenosis, bilateral  No high grade plaque on neck CTA.  Consider repeat carotid US in 1 year.    Current medicines are reviewed at length with the patient today.  Any concerns are as outlined above.  The following changes have been made:    Start Imdur 15 mg QD.    Labs/ tests ordered today include:  Orders Placed This Encounter  Procedures  . Basic Metabolic Panel (BMET)  . CBC w/Diff  . INR/PT  . EKG 12-Lead    Disposition:   FU me or Dr. Casandra Doffing 2 weeks after [facility]     Signed, Versie Starks, MHS 09/18/2014 5:08 PM    [facility] [place], [place], [place]  [address] Phone: [phone]; Fax: [phone]

## 2014-09-18 NOTE — Patient Instructions (Signed)
Medication Instructions:  1. START IMDUR 15 MG DAILY (THIS WILL BE 1/2 TAB DAILY); RX SENT IN  Labwork: TODAY BMET, CBC W/DIFF, PT/INR  Testing/Procedures: Your physician has requested that you have a cardiac catheterization. Cardiac catheterization is used to diagnose and/or treat various heart conditions. Doctors may recommend this procedure for a number of different reasons. The most common reason is to evaluate chest pain. Chest pain can be a symptom of coronary artery disease (CAD), and cardiac catheterization can show whether plaque is narrowing or blocking your heart's arteries. This procedure is also used to evaluate the valves, as well as measure the blood flow and oxygen levels in different parts of your heart. For further information please visit [email or web address]. Please follow instruction sheet, as given.  Follow-Up: YOU HAVE A FOLLOW UP WITH Gerald, [facility] 10/14/14 @ 2:40  Any Other Special Instructions Will Be Listed Below (If Applicable). YOU HAVE BEEN INSTRUCTED TO GO TO THE ER IF YOUR SYMPTOMS GET WORSE BEFORE YOUR CATH

## 2014-09-19 ENCOUNTER — Telehealth: Payer: Self-pay | Admitting: Physician Assistant

## 2014-09-19 LAB — CBC WITH DIFFERENTIAL/PLATELET
Basophils Absolute: 0.1 10*3/uL (ref 0.0–0.1)
Basophils Relative: 1 % (ref 0.0–3.0)
Eosinophils Absolute: 0.2 10*3/uL (ref 0.0–0.7)
Eosinophils Relative: 2.4 % (ref 0.0–5.0)
HEMATOCRIT: 43.7 % (ref 39.0–52.0)
HEMOGLOBIN: 15.1 g/dL (ref 13.0–17.0)
Lymphocytes Relative: 31.9 % (ref 12.0–46.0)
Lymphs Abs: 2.6 10*3/uL (ref 0.7–4.0)
MCHC: 34.7 g/dL (ref 30.0–36.0)
MCV: 87.7 fl (ref 78.0–100.0)
MONOS PCT: 4.7 % (ref 3.0–12.0)
Monocytes Absolute: 0.4 10*3/uL (ref 0.1–1.0)
NEUTROS ABS: 4.8 10*3/uL (ref 1.4–7.7)
Neutrophils Relative %: 60 % (ref 43.0–77.0)
PLATELETS: 191 10*3/uL (ref 150.0–400.0)
RBC: 4.98 Mil/uL (ref 4.22–5.81)
RDW: 13.4 % (ref 11.5–15.5)
WBC: 8 10*3/uL (ref 4.0–10.5)

## 2014-09-19 LAB — BASIC METABOLIC PANEL
BUN: 13 mg/dL (ref 6–23)
CHLORIDE: 103 meq/L (ref 96–112)
CO2: 29 mEq/L (ref 19–32)
CREATININE: 0.79 mg/dL (ref 0.40–1.50)
Calcium: 9.2 mg/dL (ref 8.4–10.5)
GFR: 105.18 mL/min (ref >60.00)
Glucose, Bld: 95 mg/dL (ref 70–99)
Potassium: 4.3 mEq/L (ref 3.5–5.1)
SODIUM: 138 meq/L (ref 135–145)

## 2014-09-19 LAB — PROTIME-INR
INR: 0.9 ratio (ref 0.8–1.0)
Prothrombin Time: 9.9 s (ref 9.6–13.1)

## 2014-09-19 NOTE — Telephone Encounter (Signed)
New message ° ° ° ° ° °Returning a call to get lab results °

## 2014-09-19 NOTE — Telephone Encounter (Signed)
lmptcb lab work normal and ok for his upcomiing cath. If any questions can cb [phone].

## 2014-09-23 ENCOUNTER — Ambulatory Visit: Payer: 59 | Admitting: Occupational Therapy

## 2014-09-24 ENCOUNTER — Encounter (HOSPITAL_COMMUNITY)
Admission: RE | Disposition: A | Discharge status: Home / Self Care | Payer: 59 | Source: Ambulatory Visit | Attending: Interventional Cardiology

## 2014-09-24 ENCOUNTER — Inpatient Hospital Stay (HOSPITAL_COMMUNITY)
Admission: RE | Admit: 2014-09-24 | Discharge: 2014-09-26 | DRG: 247 | Disposition: A | Discharge status: Home / Self Care | Payer: 59 | Source: Ambulatory Visit | Attending: Interventional Cardiology | Admitting: Interventional Cardiology

## 2014-09-24 ENCOUNTER — Encounter (HOSPITAL_COMMUNITY): Payer: Self-pay | Admitting: Interventional Cardiology

## 2014-09-24 DIAGNOSIS — I2510 Atherosclerotic heart disease of native coronary artery without angina pectoris: Secondary | ICD-10-CM

## 2014-09-24 DIAGNOSIS — Z7982 Long term (current) use of aspirin: Secondary | ICD-10-CM

## 2014-09-24 DIAGNOSIS — F17210 Nicotine dependence, cigarettes, uncomplicated: Secondary | ICD-10-CM | POA: Diagnosis present

## 2014-09-24 DIAGNOSIS — I6340 Cerebral infarction due to embolism of unspecified cerebral artery: Secondary | ICD-10-CM | POA: Diagnosis present

## 2014-09-24 DIAGNOSIS — I25110 Atherosclerotic heart disease of native coronary artery with unstable angina pectoris: Principal | ICD-10-CM | POA: Diagnosis present

## 2014-09-24 DIAGNOSIS — R072 Precordial pain: Secondary | ICD-10-CM

## 2014-09-24 DIAGNOSIS — Z9861 Coronary angioplasty status: Secondary | ICD-10-CM

## 2014-09-24 DIAGNOSIS — I200 Unstable angina: Secondary | ICD-10-CM | POA: Diagnosis present

## 2014-09-24 DIAGNOSIS — Z8673 Personal history of transient ischemic attack (TIA), and cerebral infarction without residual deficits: Secondary | ICD-10-CM

## 2014-09-24 DIAGNOSIS — Z95818 Presence of other cardiac implants and grafts: Secondary | ICD-10-CM

## 2014-09-24 DIAGNOSIS — I10 Essential (primary) hypertension: Secondary | ICD-10-CM | POA: Diagnosis present

## 2014-09-24 DIAGNOSIS — E785 Hyperlipidemia, unspecified: Secondary | ICD-10-CM | POA: Diagnosis present

## 2014-09-24 HISTORY — DX: Basal cell carcinoma of skin of nose: C44.311

## 2014-09-24 HISTORY — DX: Cerebral infarction, unspecified: I63.9

## 2014-09-24 HISTORY — DX: Depression, unspecified: F32.A

## 2014-09-24 HISTORY — PX: FRACTIONAL FLOW RESERVE WIRE: SHX5839

## 2014-09-24 HISTORY — PX: CARDIAC CATHETERIZATION: SHX172

## 2014-09-24 HISTORY — DX: Major depressive disorder, single episode, unspecified: F32.9

## 2014-09-24 LAB — POCT ACTIVATED CLOTTING TIME
ACTIVATED CLOTTING TIME: 276 s
Activated Clotting Time: 589 seconds

## 2014-09-24 SURGERY — LEFT HEART CATH AND CORONARY ANGIOGRAPHY
Anesthesia: LOCAL

## 2014-09-24 MED ORDER — SODIUM CHLORIDE 0.9 % IJ SOLN: 3.0000 mL | Freq: Two times a day (BID) | INTRAMUSCULAR | Status: DC

## 2014-09-24 MED ORDER — ADENOSINE 12 MG/4ML IV SOLN
16.0000 mL | Freq: Once | INTRAVENOUS | Status: DC
  Filled 2014-09-24: qty 16

## 2014-09-24 MED ORDER — SODIUM CHLORIDE 0.9 % IJ SOLN: 3.0000 mL | INTRAMUSCULAR | Status: DC | PRN

## 2014-09-24 MED ORDER — VERAPAMIL HCL 2.5 MG/ML IV SOLN
INTRAVENOUS | Status: DC | PRN
  Administered 2014-09-24: 11:00:00 via INTRA_ARTERIAL

## 2014-09-24 MED ORDER — HEPARIN SODIUM (PORCINE) 1000 UNIT/ML IJ SOLN
INTRAMUSCULAR | Status: AC
  Filled 2014-09-24: qty 1

## 2014-09-24 MED ORDER — FENTANYL CITRATE (PF) 100 MCG/2ML IJ SOLN
INTRAMUSCULAR | Status: DC | PRN
  Administered 2014-09-24: 50 ug via INTRAVENOUS
  Administered 2014-09-24: 25 ug via INTRAVENOUS

## 2014-09-24 MED ORDER — NITROGLYCERIN 0.4 MG SL SUBL: 0.4000 mg | SUBLINGUAL_TABLET | SUBLINGUAL | Status: DC | PRN

## 2014-09-24 MED ORDER — SODIUM CHLORIDE 0.9 % IV SOLN: 250.0000 mL | INTRAVENOUS | Status: DC | PRN

## 2014-09-24 MED ORDER — ONDANSETRON HCL 4 MG/2ML IJ SOLN
4.0000 mg | Freq: Four times a day (QID) | INTRAMUSCULAR | Status: DC | PRN
  Filled 2014-09-24: qty 2

## 2014-09-24 MED ORDER — ACETAMINOPHEN 325 MG PO TABS
650.0000 mg | ORAL_TABLET | ORAL | Status: DC | PRN
  Administered 2014-09-25: 650 mg via ORAL
  Filled 2014-09-24: qty 2

## 2014-09-24 MED ORDER — ATORVASTATIN CALCIUM 20 MG PO TABS
20.0000 mg | ORAL_TABLET | Freq: Every day | ORAL | Status: DC
  Administered 2014-09-24 – 2014-09-25 (×2): 20 mg via ORAL
  Filled 2014-09-24 (×2): qty 1

## 2014-09-24 MED ORDER — HEPARIN SODIUM (PORCINE) 1000 UNIT/ML IJ SOLN
INTRAMUSCULAR | Status: DC | PRN
  Administered 2014-09-24: 4500 [IU] via INTRAVENOUS
  Administered 2014-09-24: 5000 [IU] via INTRAVENOUS

## 2014-09-24 MED ORDER — CLOPIDOGREL BISULFATE 300 MG PO TABS
ORAL_TABLET | ORAL | Status: AC
  Filled 2014-09-24: qty 1

## 2014-09-24 MED ORDER — ASPIRIN 81 MG PO CHEW
81.0000 mg | CHEWABLE_TABLET | Freq: Every day | ORAL | Status: DC
  Administered 2014-09-26: 81 mg via ORAL
  Filled 2014-09-24: qty 1

## 2014-09-24 MED ORDER — ISOSORBIDE MONONITRATE 15 MG HALF TABLET
15.0000 mg | ORAL_TABLET | Freq: Every day | ORAL | Status: DC
  Filled 2014-09-24: qty 1

## 2014-09-24 MED ORDER — SODIUM CHLORIDE 0.9 % IV SOLN
250.0000 mg | INTRAVENOUS | Status: DC | PRN
  Administered 2014-09-24: .75 mg/kg/h via INTRAVENOUS

## 2014-09-24 MED ORDER — FENTANYL CITRATE (PF) 100 MCG/2ML IJ SOLN
INTRAMUSCULAR | Status: AC
  Filled 2014-09-24: qty 2

## 2014-09-24 MED ORDER — NITROGLYCERIN 0.2 MG/ML ON CALL CATH LAB
INTRAVENOUS | Status: DC | PRN
  Administered 2014-09-24: 200 ug via INTRACORONARY

## 2014-09-24 MED ORDER — VERAPAMIL HCL 2.5 MG/ML IV SOLN
INTRAVENOUS | Status: AC
  Filled 2014-09-24: qty 2

## 2014-09-24 MED ORDER — NITROGLYCERIN IN D5W 200-5 MCG/ML-% IV SOLN
0.0000 ug/min | INTRAVENOUS | Status: DC
Start: 2014-09-24 — End: 2014-09-26
  Administered 2014-09-24: 19:00:00 10 ug/min via INTRAVENOUS

## 2014-09-24 MED ORDER — CLOPIDOGREL BISULFATE 75 MG PO TABS
75.0000 mg | ORAL_TABLET | Freq: Every day | ORAL | Status: DC
  Administered 2014-09-25 – 2014-09-26 (×2): 75 mg via ORAL
  Filled 2014-09-24 (×2): qty 1

## 2014-09-24 MED ORDER — CLOPIDOGREL BISULFATE 75 MG PO TABS
ORAL_TABLET | ORAL | Status: DC | PRN
  Administered 2014-09-24: 600 mg via ORAL

## 2014-09-24 MED ORDER — CITALOPRAM HYDROBROMIDE 10 MG PO TABS
10.0000 mg | ORAL_TABLET | Freq: Every day | ORAL | Status: DC
  Administered 2014-09-24 – 2014-09-25 (×2): 10 mg via ORAL
  Filled 2014-09-24 (×5): qty 1

## 2014-09-24 MED ORDER — ASPIRIN 81 MG PO CHEW: 81.0000 mg | CHEWABLE_TABLET | ORAL | Status: DC

## 2014-09-24 MED ORDER — NITROGLYCERIN IN D5W 200-5 MCG/ML-% IV SOLN
INTRAVENOUS | Status: DC | PRN
  Administered 2014-09-24: 10 ug/min via INTRAVENOUS

## 2014-09-24 MED ORDER — MORPHINE SULFATE 2 MG/ML IJ SOLN: 1.0000 mg | INTRAMUSCULAR | Status: DC | PRN

## 2014-09-24 MED ORDER — BIVALIRUDIN 250 MG IV SOLR
INTRAVENOUS | Status: AC
  Filled 2014-09-24: qty 250

## 2014-09-24 MED ORDER — HEPARIN (PORCINE) IN NACL 2-0.9 UNIT/ML-% IJ SOLN
INTRAMUSCULAR | Status: AC
  Filled 2014-09-24: qty 1000

## 2014-09-24 MED ORDER — SODIUM CHLORIDE 0.9 % IV SOLN
INTRAVENOUS | Status: DC
  Administered 2014-09-24: 10:00:00 via INTRAVENOUS

## 2014-09-24 MED ORDER — METOPROLOL SUCCINATE ER 25 MG PO TB24
25.0000 mg | ORAL_TABLET | Freq: Every day | ORAL | Status: DC
  Administered 2014-09-24 – 2014-09-26 (×3): 25 mg via ORAL
  Filled 2014-09-24 (×3): qty 1

## 2014-09-24 MED ORDER — IOHEXOL 350 MG/ML SOLN
INTRAVENOUS | Status: DC | PRN
  Administered 2014-09-24: 160 mL via INTRA_ARTERIAL

## 2014-09-24 MED ORDER — SODIUM CHLORIDE 0.9 % WEIGHT BASED INFUSION
3.0000 mL/kg/h | INTRAVENOUS | Status: AC
  Administered 2014-09-24: 16:00:00 3 mL/kg/h via INTRAVENOUS

## 2014-09-24 MED ORDER — LISINOPRIL 20 MG PO TABS
20.0000 mg | ORAL_TABLET | Freq: Every day | ORAL | Status: DC
  Administered 2014-09-24: 16:00:00 20 mg via ORAL
  Filled 2014-09-24: qty 2
  Filled 2014-09-24 (×2): qty 1

## 2014-09-24 MED ORDER — MIDAZOLAM HCL 2 MG/2ML IJ SOLN
INTRAMUSCULAR | Status: AC
  Filled 2014-09-24: qty 2

## 2014-09-24 MED ORDER — SODIUM CHLORIDE 0.9 % IJ SOLN
3.0000 mL | Freq: Two times a day (BID) | INTRAMUSCULAR | Status: DC
  Administered 2014-09-24: 16:00:00 3 mL via INTRAVENOUS

## 2014-09-24 MED ORDER — MIDAZOLAM HCL 2 MG/2ML IJ SOLN
INTRAMUSCULAR | Status: DC | PRN
  Administered 2014-09-24: 1 mg via INTRAVENOUS
  Administered 2014-09-24: 2 mg via INTRAVENOUS

## 2014-09-24 MED ORDER — NITROGLYCERIN 1 MG/10 ML FOR IR/CATH LAB
INTRA_ARTERIAL | Status: AC
  Filled 2014-09-24: qty 10

## 2014-09-24 SURGICAL SUPPLY — 24 items
BALLN EMERGE MR 2.5X15 (BALLOONS) ×3
BALLN ~~LOC~~ EMERGE MR 3.25X12 (BALLOONS) ×3
BALLOON EMERGE MR 2.5X15 (BALLOONS) ×2 IMPLANT
BALLOON ~~LOC~~ EMERGE MR 3.25X12 (BALLOONS) ×2 IMPLANT
CATH INFINITI 5 FR JL3.5 (CATHETERS) ×3 IMPLANT
CATH INFINITI 5FR ANG PIGTAIL (CATHETERS) ×3 IMPLANT
CATH INFINITI JR4 5F (CATHETERS) ×3 IMPLANT
CATH MICROCATH NAVVUS (MICROCATHETER) ×2 IMPLANT
CUTTING BAL FLEXTOME RX 2.75X6 (BALLOONS) ×3 IMPLANT
DEVICE RAD COMP TR BAND LRG (VASCULAR PRODUCTS) ×3 IMPLANT
GLIDESHEATH SLEND SS 6F .021 (SHEATH) ×3 IMPLANT
GUIDE CATH RUNWAY 6FR CLS3 (CATHETERS) ×3 IMPLANT
KIT ENCORE 26 ADVANTAGE (KITS) ×3 IMPLANT
KIT HEART LEFT (KITS) ×3 IMPLANT
MICROCATHETER NAVVUS (MICROCATHETER) ×3
PACK CARDIAC CATHETERIZATION (CUSTOM PROCEDURE TRAY) ×3 IMPLANT
STENT SYNERGY DES 3X20 (Permanent Stent) ×3 IMPLANT
SYR MEDRAD MARK V 150ML (SYRINGE) ×3 IMPLANT
TRANSDUCER W/STOPCOCK (MISCELLANEOUS) ×3 IMPLANT
TUBING CIL FLEX 10 FLL-RA (TUBING) ×3 IMPLANT
VALVE GUARDIAN II ~~LOC~~ HEMO (MISCELLANEOUS) ×3 IMPLANT
WIRE ASAHI PROWATER 180CM (WIRE) ×3 IMPLANT
WIRE HI TORQ VERSACORE-J 145CM (WIRE) ×3 IMPLANT
WIRE SAFE-T 1.5MM-J .035X260CM (WIRE) ×3 IMPLANT

## 2014-09-24 NOTE — Progress Notes (Signed)
TR BAND REMOVAL  LOCATION:    right radial  DEFLATED PER PROTOCOL:    Yes.    TIME BAND OFF / DRESSING APPLIED:    1700   SITE UPON ARRIVAL:    Level 0  SITE AFTER BAND REMOVAL:    Level 0  REVERSE ALLEN'S TEST:     positive  CIRCULATION SENSATION AND MOVEMENT:    Within Normal Limits   Yes.    COMMENTS:   Tolerated procedure well 

## 2014-09-24 NOTE — Interval H&P Note (Signed)
Cath Lab Visit (complete for each Cath Lab visit)  Clinical Evaluation Leading to the Procedure:   ACS: No.  Non-ACS:    Anginal Classification: CCS III  Anti-ischemic medical therapy: Maximal Therapy (2 or more classes of medications)  Non-Invasive Test Results: No non-invasive testing performed  Prior CABG: No previous CABG   Ischemic Symptoms? CCS III (Marked limitation of ordinary activity) Anti-ischemic Medical Therapy? Maximal Medical Therapy (2 or more classes of medications) Non-invasive Test Results? No non-invasive testing performed Prior CABG? No Previous CABG   Patient Information:   1-2V CAD, no prox LAD  A (7)  Indication: 20; Score: 7   Patient Information:   1-2V-CAD with DS 50-60% With No FFR, No IVUS  I (3)  Indication: 21; Score: 3   Patient Information:   1-2V-CAD with DS 50-60% With FFR  A (7)  Indication: 22; Score: 7   Patient Information:   1-2V-CAD with DS 50-60% With FFR>0.8, IVUS not significant  I (2)  Indication: 23; Score: 2   Patient Information:   3V-CAD without LMCA With Abnormal LV systolic function  A (9)  Indication: 48; Score: 9   Patient Information:   LMCA-CAD  A (9)  Indication: 49; Score: 9   Patient Information:   2V-CAD with prox LAD PCI  A (7)  Indication: 62; Score: 7   Patient Information:   2V-CAD with prox LAD CABG  A (8)  Indication: 62; Score: 8   Patient Information:   3V-CAD without LMCA With Low CAD burden(i.e., 3 focal stenoses, low SYNTAX score) PCI  A (7)  Indication: 63; Score: 7   Patient Information:   3V-CAD without LMCA With Low CAD burden(i.e., 3 focal stenoses, low SYNTAX score) CABG  A (9)  Indication: 63; Score: 9   Patient Information:   3V-CAD without LMCA E06c - Intermediate-high CAD burden (i.e., multiple diffuse lesions, presence of CTO, or high SYNTAX score) PCI  U (4)  Indication: 64; Score: 4   Patient Information:   3V-CAD without  LMCA E06c - Intermediate-high CAD burden (i.e., multiple diffuse lesions, presence of CTO, or high SYNTAX score) CABG  A (9)  Indication: 64; Score: 9   Patient Information:   LMCA-CAD With Isolated LMCA stenosis  PCI  U (6)  Indication: 65; Score: 6   Patient Information:   LMCA-CAD With Isolated LMCA stenosis  CABG  A (9)  Indication: 65; Score: 9   Patient Information:   LMCA-CAD Additional CAD, low CAD burden (i.e., 1- to 2-vessel additional involvement, low SYNTAX score) PCI  U (5)  Indication: 66; Score: 5   Patient Information:   LMCA-CAD Additional CAD, low CAD burden (i.e., 1- to 2-vessel additional involvement, low SYNTAX score) CABG  A (9)  Indication: 66; Score: 9   Patient Information:   LMCA-CAD Additional CAD, intermediate-high CAD burden (i.e., 3-vessel involvement, presence of CTO, or high SYNTAX score) PCI  I (3)  Indication: 67; Score: 3   Patient Information:   LMCA-CAD Additional CAD, intermediate-high CAD burden (i.e., 3-vessel involvement, presence of CTO, or high SYNTAX score) CABG  A (9)  Indication: 67; Score: 9    History and Physical Interval Note:  09/24/2014 11:12 AM  Daniel [name]  has presented today for surgery, with the diagnosis of excertional angina  The various methods of treatment have been discussed with the patient and family. After consideration of risks, benefits and other options for treatment, the patient has consented to  Procedure(s): Left Heart  Cath and Coronary Angiography (N/A) as a surgical intervention .  The patient's history has been reviewed, patient examined, no change in status, stable for surgery.  I have reviewed the patient's chart and labs.  Questions were answered to the patient's satisfaction.     Daniel Allemand S.

## 2014-09-24 NOTE — H&P (View-Only) (Signed)
Cardiology Office Note   Date:  09/18/2014   ID:  Daniel [name], DOB 08/26/1950, MRN [id number]  PCP:   Melinda Crutch, MD  Cardiologist:  Dr. Casandra Doffing     Chief Complaint  Patient presents with  . Chest Pain    Follow-up     History of Present Illness: Daniel [name] is a 64 y.o. male with a hx of tobacco abuse, HTN. 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  Echocardiogram demonstrated reduced LV function with an EF of 40-45%. Cardiac enzymes remained negative.  He did have symptoms of exertional chest pain. Given recent CVA, it was felt that the patient should avoid cardiac catheterization for at least 2 weeks post CVA . He would ultimately need cardiac catheterization for further evaluation.  Because of his stroke, he was seen by Dr. Rayann Heman and underwent implantation of implantable loop recorder.  TEE was performed and demonstrated EF of 55% (appeared better than transthoracic echocardiogram).  I saw him last month in FU.  He was overall doing well.  I reviewed his case with Dr. [name] who followed the patient in the hospital.  We decided to hold off on proceeding with cardiac cath as he was not having any significant angina. I started beta-blocker with Toprol-XL.  He returns for FU.  Unfortunately, over the past several weeks, he has developed worsening exertional chest tightness.  He is getting symptoms with minimal activity.  His PCP recently gave him prn NTG.  He has not used it.  He has assoc dyspnea with his chest pain.  No radiation or assoc nausea or diaphoresis.  He still has a lot of L hand symptoms.  He is currently going to PT/OT.     Studies/Reports Reviewed Today:  Echo 07/29/14 - mild LVH. EF 40% to 45%. grade 1 diastolic dysfunction). - Left atrium: The atrium was mildly dilated.  TEE 07/31/14 EF 55% Normal RV size and function Mild LAE No LAA clot Negative bubble study  Neck CTA 07/28/14 Mild bilateral ICA calcific plaque (nothing >  50%)   Past Medical History  Diagnosis Date  . Essential hypertension   . CVA (cerebral infarction)     07/2014  . HLD (hyperlipidemia)   . Hx of echocardiogram     a.  Echo 3/16:  mild LVH, EF 40-45%, Gr 1 DD, mild LAE;  b.  TEE 3/16:  EF 55%, mild LAE, no LAA clot, neg bubble study  . Carotid stenosis     a. Neck CTA 3/16:  mild bilat ICA calcific plaque (nothing > 50%)    Past Surgical History  Procedure Laterality Date  . Circumcision    . Loop recorder implant N/A 07/31/2014    Procedure: LOOP RECORDER IMPLANT;  Surgeon: [name], MD;  Location: [facility] CATH LAB;  Service: Cardiovascular;  Laterality: N/A;  . Tee without cardioversion N/A 07/31/2014    Procedure: TRANSESOPHAGEAL ECHOCARDIOGRAM (TEE);  Surgeon: Larey Dresser, MD;  Location: [facility] ENDOSCOPY;  Service: Cardiovascular;  Laterality: N/A;     Current Outpatient Prescriptions  Medication Sig Dispense Refill  . aspirin EC 325 MG EC tablet Take 1 tablet (325 mg total) by mouth daily. 30 tablet 0  . atorvastatin (LIPITOR) 20 MG tablet Take 1 tablet (20 mg total) by mouth daily at 6 PM. 30 tablet 0  . citalopram (CELEXA) 10 MG tablet Take 10 mg by mouth daily.   1  . lisinopril (PRINIVIL,ZESTRIL) 20 MG tablet Take 1 tablet (20  mg total) by mouth daily. 30 tablet 0  . metoprolol succinate (TOPROL-XL) 25 MG 24 hr tablet Take 1 tablet (25 mg total) by mouth daily. 30 tablet 11  . nitroGLYCERIN (NITROSTAT) 0.4 MG SL tablet Place 1 tablet (0.4 mg total) under the tongue every 5 (five) minutes as needed. 25 tablet 3  . isosorbide mononitrate (IMDUR) 30 MG 24 hr tablet Take 0.5 tablets (15 mg total) by mouth daily. 30 tablet 6   No current facility-administered medications for this visit.    Allergies:   Review of patient's allergies indicates no known allergies.    Social History:  The patient  reports that he has been smoking Cigarettes.  He has been smoking about 0.25 packs per day. He has never used smokeless tobacco. He  reports that he does not drink alcohol or use illicit drugs.   Family History:  The patient's family history includes Heart attack in his brother, father, and mother; Heart failure in his brother, father, and paternal uncle; Hypertension in his brother, father, mother, and paternal uncle. There is no history of Stroke.    ROS:   Please see the history of present illness.   Review of Systems  Constitution: Positive for chills.  HENT: Positive for headaches.   Cardiovascular: Positive for chest pain, dyspnea on exertion and orthopnea.  Respiratory: Positive for cough.   Gastrointestinal: Positive for constipation.  Psychiatric/Behavioral: Positive for depression. The patient is nervous/anxious.   All other systems reviewed and are negative.     PHYSICAL EXAM: VS:  BP 160/80 mmHg  Pulse 65  Ht 5\' 11"  (1.803 m)  Wt 198 lb (89.812 kg)  BMI 27.63 kg/m2    Wt Readings from Last 3 Encounters:  09/18/14 198 lb (89.812 kg)  08/21/14 197 lb (89.359 kg)  07/28/14 192 lb 15.9 oz (87.54 kg)     GEN: Well nourished, well developed, in no acute distress HEENT: normal Neck: no JVD, no masses Cardiac:  Normal S1/S2, RRR; no murmur ,  no rubs or gallops,  no edema  Respiratory:  clear to auscultation bilaterally, no wheezing, rhonchi or rales. GI: soft, nontender, nondistended, + BS MS: no deformity or atrophy Skin: warm and dry  Neuro:  CNs II-XII intact, Strength and sensation are intact Psych: Normal affect   EKG:  EKG is ordered today.  It demonstrates:  NSR, HR 65, inc RBBB, NSSTTW changes, no change from prior tracing   Recent Labs: 07/27/2014: ALT 32; BUN 17; Creatinine 0.90; Hemoglobin 17.0; Platelets 224; Potassium 4.0; Sodium 138    Lipid Panel    Component Value Date/Time   CHOL 185 07/28/2014 0744   TRIG 119 07/28/2014 0744   HDL 32* 07/28/2014 0744   CHOLHDL 5.8 07/28/2014 0744   VLDL 24 07/28/2014 0744   LDLCALC 129* 07/28/2014 0744      ASSESSMENT AND  PLAN:  Precordial pain He has worsening chest pain with minimal activity.  His symptoms are c/w CCS Class 3 angina.  He has a lot of risk factors including known vascular disease, HTN, HL, tobacco abuse and FHx of CAD.  I have recommended that we proceed with cardiac catheterization.  I have also reviewed this with Dr. Casandra Doffing who agrees.  Risks and benefits of cardiac catheterization have been discussed with the patient.  These include bleeding, infection, kidney damage, stroke, heart attack, death.  The patient understands these risks and is willing to proceed.  Continue ASA, statin, beta-blocker.  I will star Isosorbide 15  mg QD.  He does not take PDE-5 inhibitors.  He knows to go to the ED if his symptoms worsen.  Essential hypertension BP elevated today.  He forgot to take his medications this AM.  Continue to monitor.   Dyslipidemia Continue statin.  Cerebral infarction due to unspecified mechanism ILR is followed by EP.  Continue ASA, statin, ACE inhibitor.  FU with Neuro and PT as planned.   Carotid stenosis, bilateral  No high grade plaque on neck CTA.  Consider repeat carotid US in 1 year.    Current medicines are reviewed at length with the patient today.  Any concerns are as outlined above.  The following changes have been made:    Start Imdur 15 mg QD.    Labs/ tests ordered today include:  Orders Placed This Encounter  Procedures  . Basic Metabolic Panel (BMET)  . CBC w/Diff  . INR/PT  . EKG 12-Lead    Disposition:   FU me or Dr. Casandra Doffing 2 weeks after [facility].     Signed, Versie Starks, MHS 09/18/2014 5:08 PM    [facility] [place], [place], [place]  [address] Phone: [phone]; Fax: [phone]

## 2014-09-25 ENCOUNTER — Encounter (HOSPITAL_COMMUNITY)
Admission: RE | Disposition: A | Discharge status: Home / Self Care | Payer: 59 | Source: Ambulatory Visit | Attending: Interventional Cardiology

## 2014-09-25 ENCOUNTER — Encounter: Payer: 59 | Admitting: Occupational Therapy

## 2014-09-25 ENCOUNTER — Encounter (HOSPITAL_COMMUNITY): Payer: Self-pay | Admitting: Interventional Cardiology

## 2014-09-25 DIAGNOSIS — E785 Hyperlipidemia, unspecified: Secondary | ICD-10-CM | POA: Diagnosis present

## 2014-09-25 DIAGNOSIS — F17210 Nicotine dependence, cigarettes, uncomplicated: Secondary | ICD-10-CM | POA: Diagnosis present

## 2014-09-25 DIAGNOSIS — R079 Chest pain, unspecified: Secondary | ICD-10-CM | POA: Diagnosis present

## 2014-09-25 DIAGNOSIS — I25119 Atherosclerotic heart disease of native coronary artery with unspecified angina pectoris: Secondary | ICD-10-CM

## 2014-09-25 DIAGNOSIS — Z8673 Personal history of transient ischemic attack (TIA), and cerebral infarction without residual deficits: Secondary | ICD-10-CM | POA: Diagnosis not present

## 2014-09-25 DIAGNOSIS — I25110 Atherosclerotic heart disease of native coronary artery with unstable angina pectoris: Secondary | ICD-10-CM | POA: Diagnosis present

## 2014-09-25 DIAGNOSIS — I200 Unstable angina: Secondary | ICD-10-CM | POA: Diagnosis present

## 2014-09-25 DIAGNOSIS — Z7982 Long term (current) use of aspirin: Secondary | ICD-10-CM | POA: Diagnosis not present

## 2014-09-25 DIAGNOSIS — I10 Essential (primary) hypertension: Secondary | ICD-10-CM | POA: Diagnosis present

## 2014-09-25 HISTORY — PX: CARDIAC CATHETERIZATION: SHX172

## 2014-09-25 LAB — BASIC METABOLIC PANEL
Anion gap: 8 (ref 5–15)
BUN: 12 mg/dL (ref 6–20)
CO2: 25 mmol/L (ref 22–32)
CREATININE: 0.92 mg/dL (ref 0.61–1.24)
Calcium: 8.7 mg/dL — ABNORMAL LOW (ref 8.9–10.3)
Chloride: 105 mmol/L (ref 101–111)
GFR calc non Af Amer: >60 mL/min (ref >60)
Glucose, Bld: 95 mg/dL (ref 65–99)
POTASSIUM: 4.1 mmol/L (ref 3.5–5.1)
SODIUM: 138 mmol/L (ref 135–145)

## 2014-09-25 LAB — CBC
HCT: 41 % (ref 39.0–52.0)
HEMOGLOBIN: 13.8 g/dL (ref 13.0–17.0)
MCH: 30 pg (ref 26.0–34.0)
MCHC: 33.7 g/dL (ref 30.0–36.0)
MCV: 89.1 fL (ref 78.0–100.0)
Platelets: 177 10*3/uL (ref 150–400)
RBC: 4.6 MIL/uL (ref 4.22–5.81)
RDW: 13.5 % (ref 11.5–15.5)
WBC: 8 10*3/uL (ref 4.0–10.5)

## 2014-09-25 LAB — POCT ACTIVATED CLOTTING TIME: Activated Clotting Time: 306 seconds

## 2014-09-25 SURGERY — CORONARY STENT INTERVENTION

## 2014-09-25 MED ORDER — SODIUM CHLORIDE 0.9 % IV SOLN: 250.0000 mL | INTRAVENOUS | Status: DC | PRN

## 2014-09-25 MED ORDER — IOHEXOL 350 MG/ML SOLN
INTRAVENOUS | Status: DC | PRN
  Administered 2014-09-25: 45 mL via INTRA_ARTERIAL

## 2014-09-25 MED ORDER — HEPARIN SODIUM (PORCINE) 1000 UNIT/ML IJ SOLN
INTRAMUSCULAR | Status: DC | PRN
  Administered 2014-09-25: 9000 [IU] via INTRAVENOUS

## 2014-09-25 MED ORDER — MIDAZOLAM HCL 2 MG/2ML IJ SOLN
INTRAMUSCULAR | Status: AC
  Filled 2014-09-25: qty 2

## 2014-09-25 MED ORDER — SODIUM CHLORIDE 0.9 % IJ SOLN: 3.0000 mL | Freq: Two times a day (BID) | INTRAMUSCULAR | Status: DC

## 2014-09-25 MED ORDER — NITROGLYCERIN 1 MG/10 ML FOR IR/CATH LAB
INTRA_ARTERIAL | Status: AC
  Filled 2014-09-25: qty 10

## 2014-09-25 MED ORDER — NITROGLYCERIN 1 MG/10 ML FOR IR/CATH LAB
INTRA_ARTERIAL | Status: DC | PRN
Start: 2014-09-25 — End: 2014-09-25
  Administered 2014-09-25: 200 ug via INTRA_ARTERIAL

## 2014-09-25 MED ORDER — HEPARIN (PORCINE) IN NACL 2-0.9 UNIT/ML-% IJ SOLN
INTRAMUSCULAR | Status: AC
  Filled 2014-09-25: qty 1000

## 2014-09-25 MED ORDER — SODIUM CHLORIDE 0.9 % IV SOLN
INTRAVENOUS | Status: AC
  Administered 2014-09-25: 17:00:00 via INTRAVENOUS

## 2014-09-25 MED ORDER — NITROGLYCERIN 1 MG/10 ML FOR IR/CATH LAB
INTRA_ARTERIAL | Status: DC | PRN
Start: 2014-09-25 — End: 2014-09-25
  Administered 2014-09-25: 150 ug via INTRA_ARTERIAL

## 2014-09-25 MED ORDER — VERAPAMIL HCL 2.5 MG/ML IV SOLN
INTRAVENOUS | Status: AC
  Filled 2014-09-25: qty 2

## 2014-09-25 MED ORDER — FENTANYL CITRATE (PF) 100 MCG/2ML IJ SOLN
INTRAMUSCULAR | Status: AC
  Filled 2014-09-25: qty 2

## 2014-09-25 MED ORDER — ASPIRIN 81 MG PO CHEW
81.0000 mg | CHEWABLE_TABLET | ORAL | Status: AC
  Administered 2014-09-25: 09:00:00 81 mg via ORAL
  Filled 2014-09-25: qty 1

## 2014-09-25 MED ORDER — SODIUM CHLORIDE 0.9 % WEIGHT BASED INFUSION
3.0000 mL/kg/h | INTRAVENOUS | Status: AC
  Administered 2014-09-25: 08:00:00 3 mL/kg/h via INTRAVENOUS

## 2014-09-25 MED ORDER — SODIUM CHLORIDE 0.9 % WEIGHT BASED INFUSION: 1.0000 mL/kg/h | INTRAVENOUS | Status: DC

## 2014-09-25 MED ORDER — FENTANYL CITRATE (PF) 100 MCG/2ML IJ SOLN
INTRAMUSCULAR | Status: DC | PRN
  Administered 2014-09-25 (×2): 50 ug via INTRAVENOUS

## 2014-09-25 MED ORDER — HEPARIN SODIUM (PORCINE) 1000 UNIT/ML IJ SOLN
INTRAMUSCULAR | Status: AC
  Filled 2014-09-25: qty 1

## 2014-09-25 MED ORDER — SODIUM CHLORIDE 0.9 % IJ SOLN: 3.0000 mL | INTRAMUSCULAR | Status: DC | PRN

## 2014-09-25 MED ORDER — ZOLPIDEM TARTRATE 5 MG PO TABS: 5.0000 mg | ORAL_TABLET | Freq: Every evening | ORAL | Status: DC | PRN

## 2014-09-25 MED ORDER — DOCUSATE SODIUM 100 MG PO CAPS
100.0000 mg | ORAL_CAPSULE | Freq: Every day | ORAL | Status: DC
  Administered 2014-09-25 – 2014-09-26 (×2): 100 mg via ORAL
  Filled 2014-09-25 (×2): qty 1

## 2014-09-25 MED ORDER — LISINOPRIL 40 MG PO TABS
40.0000 mg | ORAL_TABLET | Freq: Every day | ORAL | Status: DC
  Administered 2014-09-25 – 2014-09-26 (×2): 40 mg via ORAL
  Filled 2014-09-25 (×2): qty 1

## 2014-09-25 MED ORDER — OXYCODONE-ACETAMINOPHEN 5-325 MG PO TABS: 1.0000 | ORAL_TABLET | ORAL | Status: DC | PRN

## 2014-09-25 MED ORDER — LIDOCAINE HCL (PF) 1 % IJ SOLN
INTRAMUSCULAR | Status: AC
  Filled 2014-09-25: qty 30

## 2014-09-25 MED ORDER — MIDAZOLAM HCL 2 MG/2ML IJ SOLN
INTRAMUSCULAR | Status: DC | PRN
  Administered 2014-09-25: 2 mg via INTRAVENOUS
  Administered 2014-09-25: 1 mg via INTRAVENOUS

## 2014-09-25 SURGICAL SUPPLY — 15 items
BALLN ANGIOSCULPT RX 2.5X10 (BALLOONS) ×3
BALLN ~~LOC~~ EMERGE MR 3.25X12 (BALLOONS) ×3
BALLOON ANGIOSCULPT RX 2.5X10 (BALLOONS) ×1 IMPLANT
BALLOON ~~LOC~~ EMERGE MR 3.25X12 (BALLOONS) ×1 IMPLANT
DEVICE RAD COMP TR BAND LRG (VASCULAR PRODUCTS) ×3 IMPLANT
GLIDESHEATH SLEND SS 6F .021 (SHEATH) ×3 IMPLANT
GUIDE CATH RUNWAY 6FR FR4 (CATHETERS) ×3 IMPLANT
KIT ENCORE 26 ADVANTAGE (KITS) ×3 IMPLANT
KIT HEART LEFT (KITS) ×3 IMPLANT
PACK CARDIAC CATHETERIZATION (CUSTOM PROCEDURE TRAY) ×3 IMPLANT
STENT PROMUS PREM MR 3.0X16 (Permanent Stent) ×3 IMPLANT
TRANSDUCER W/STOPCOCK (MISCELLANEOUS) ×3 IMPLANT
TUBING CIL FLEX 10 FLL-RA (TUBING) ×3 IMPLANT
WIRE COUGAR XT STRL 190CM (WIRE) ×3 IMPLANT
WIRE SAFE-T 1.5MM-J .035X260CM (WIRE) ×6 IMPLANT

## 2014-09-25 NOTE — Progress Notes (Signed)
TR BAND REMOVAL  LOCATION:    right radial  DEFLATED PER PROTOCOL:    Yes.    TIME BAND OFF / DRESSING APPLIED:    1900   SITE UPON ARRIVAL:    Level 0  SITE AFTER BAND REMOVAL:    Level 0  REVERSE ALLEN'S TEST:     positive  CIRCULATION SENSATION AND MOVEMENT:    Within Normal Limits   Yes.    COMMENTS:   Tolerated procedure well 

## 2014-09-25 NOTE — Progress Notes (Signed)
CARDIAC REHAB PHASE I   PRE:  Rate/Rhythm: 64SR  BP:  Supine:   Sitting: 112/79               Standing:    SaO2:   MODE:  Ambulation: 500 ft   POST:  Rate/Rhythm: 72 SR  BP:  Supine:   Sitting: 94/61  Standing:    SaO2:    Pt. Walked 548ft without CP. Pt appeared eager to be moving. Pt. Returned to recliner with LE elevated. Started education regarding Plavix,ASA, smoking cessation, exercise guidelines, and nutrition, NTG. Pt. Was confused on when to take NTG and knowing the difference between s/s of heart attack vs. stroke. Will continue education next time.  [id number]  Connie Hilgert D Lendora Keys,MS,ACSM-RCEP 09/25/2014 9:07 AM

## 2014-09-25 NOTE — H&P (View-Only) (Signed)
PT Profile: 64 y.o. male with a hx of tobacco abuse, HTN. 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 Echocardiogram demonstrated reduced LV function with an EF of 40-45%. Cardiac enzymes remained negative. He did have symptoms of exertional chest pain. Given recent CVA, it was felt that the patient should avoid cardiac catheterization for at least 2 weeks post CVA . He would ultimately need cardiac catheterization for further evaluation. Because of his stroke, he was seen by Dr. Rayann Heman and underwent implantation of implantable loop recorder. TEE was performed and demonstrated EF of 55% (appeared better than transthoracic echocardiogram). After wait to heal from CVA pt had cath 09/24/14:  Ost RCA lesion, 95% stenosed.  Ost LAD to Prox LAD lesion, 50% stenosed. This portion of the LAD was negative by FFR.  Mid LAD lesion, 75% stenosed. This lesion was significant by FFR. This was successfully treated with a drug-eluting stent to the mid LAD. 3.0 x 20 Synergy , postdilated to 3.3 mm in diameter. There is a 0% residual stenosis post intervention.  Will plan for PCI of the ostial RCA today. The patient will need dual antiplatelet therapy for at least a year.    Subjective: No pain, just did not sleep well last pm.   Objective: Vital signs in last 24 hours: Temp:  [97.6 F (36.4 C)-98 F (36.7 C)] 97.7 F (36.5 C) (05/18 0807) Pulse Rate:  [51-86] 64 (05/18 0807) Resp:  [12-32] 12 (05/18 0807) BP: (112-195)/(43-115) 112/79 mmHg (05/18 0807) SpO2:  [95 %-100 %] 95 % (05/18 0807) Weight:  [196 lb 6.9 oz (89.1 kg)-198 lb 10.2 oz (90.1 kg)] 196 lb 6.9 oz (89.1 kg) (05/18 0044) Weight change:    Intake/Output from previous day:-479 05/17 0701 - 05/18 0700 In: 420.7 [P.O.:360; I.V.:60.7] Out: 900 [Urine:900] Intake/Output this shift:    PE: General:Pleasant affect, NAD Skin:Warm and dry, brisk capillary refill HEENT:normocephalic, sclera clear, mucus membranes  moist Heart:S1S2 RRR without murmur, gallup, rub or click Lungs:clear without rales, rhonchi, or wheezes SWF:UXNA, non tender, + BS, do not palpate liver spleen or masses Ext:no lower ext edema, 2+ pedal pulses, 2+ radial pulses Neuro:alert and oriented X 3, MAE, follows commands, + facial symmetry Tele: SR  Lab Results:  Recent Labs  09/25/14 0334  WBC 8.0  HGB 13.8  HCT 41.0  PLT 177   BMET  Recent Labs  09/25/14 0334  NA 138  K 4.1  CL 105  CO2 25  GLUCOSE 95  BUN 12  CREATININE 0.92  CALCIUM 8.7*   No results for input(s): TROPONINI in the last 72 hours.  Invalid input(s): CK, MB  Lab Results  Component Value Date   CHOL 185 07/28/2014   HDL 32* 07/28/2014   LDLCALC 129* 07/28/2014   TRIG 119 07/28/2014   CHOLHDL 5.8 07/28/2014   Lab Results  Component Value Date   HGBA1C 5.3 07/28/2014       Studies/Results: No results found.  Medications: I have reviewed the patient's current medications. Scheduled Meds: . adenosine  16 mL Intravenous Once  . aspirin  81 mg Oral Daily  . aspirin  81 mg Oral Pre-Cath  . atorvastatin  20 mg Oral q1800  . citalopram  10 mg Oral Daily  . clopidogrel  75 mg Oral Q breakfast  . lisinopril  40 mg Oral Daily  . metoprolol succinate  25 mg Oral Daily  . sodium chloride  3 mL Intravenous Q12H  . sodium chloride  3  mL Intravenous Q12H   Continuous Infusions: . sodium chloride    . nitroGLYCERIN 10 mcg/min (09/25/14 0200)   PRN Meds:.sodium chloride, sodium chloride, acetaminophen, morphine injection, nitroGLYCERIN, ondansetron (ZOFRAN) IV, sodium chloride, sodium chloride  Assessment/Plan:  Precordial pain/unstable angina He had worsening chest pain with minimal activity. His symptoms are c/w CCS Class 3 angina. He has a lot of risk factors including known vascular disease, HTN, HL, tobacco abuse and FHx of CAD.  Continue ASA, statin, beta-blocker.Isosorbide 15 mg QD was started. He does not take PDE-5  inhibitors. Dr. Irish Lack discussed PCI with pt last evening including risks.   Essential hypertension BP elevated today. Continue to monitor.   Dyslipidemia Continue statin.  Cerebral infarction due to unspecified mechanism ILR is followed by EP. Has residual numbness in lt hand and wrist and Lt thigh. Continue ASA, statin, ACE inhibitor. FU with Neuro and PT as planned.   Carotid stenosis, bilateral  No high grade plaque on neck CTA. Consider repeat carotid US in 1 year.      Time spent with pt. :15 minutes. [facility] R  Nurse Practitioner Certified Pager [phone] or after 5pm and on weekends call [phone] 09/25/2014, 8:44 AM   Patient seen, examined. Available data reviewed. Agree with findings, assessment, and plan as outlined by Cecilie Kicks, NP. Patient independently interviewed and examined. I have reviewed his heart catheterization films with colleagues. Plans for PCI today. I reviewed the risks, indications, and alternatives to PCI with the patient. He understands and agrees to proceed. His wife and son are at the bedside. All questions were answered.  [name], M.D. 09/25/2014 1:55 PM

## 2014-09-25 NOTE — Progress Notes (Signed)
    Patient feeling well.  He would like to have RCA PCI tomorrow.  I explained that Dr. Burt Knack will be performing this procedure.  Groin access may be used.  I explained that rotablator may be used.  We also went over the risks of ostial RCA PCI.  He wants to get everything taken care of during this hospitalization.  Patient is scheduled for PCI tomorrow.    Jettie Booze, MD

## 2014-09-25 NOTE — Interval H&P Note (Signed)
History and Physical Interval Note:  09/25/2014 1:57 PM  Daniel [name]  has presented today for surgery, with the diagnosis of cad  The various methods of treatment have been discussed with the patient and family. After consideration of risks, benefits and other options for treatment, the patient has consented to  Procedure(s): Coronary Stent Intervention (N/A) as a surgical intervention .  The patient's history has been reviewed, patient examined, no change in status, stable for surgery.  I have reviewed the patient's chart and labs.  Questions were answered to the patient's satisfaction.    Cath Lab Visit (complete for each Cath Lab visit)  Clinical Evaluation Leading to the Procedure:   ACS: No.  Non-ACS:    Anginal Classification: CCS III  Anti-ischemic medical therapy: Minimal Therapy (1 class of medications)  Non-Invasive Test Results: No non-invasive testing performed  Prior CABG: No previous CABG       Daniel [name]

## 2014-09-25 NOTE — Progress Notes (Signed)
PT Profile: 64 y.o. male with a hx of tobacco abuse, HTN. 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 Echocardiogram demonstrated reduced LV function with an EF of 40-45%. Cardiac enzymes remained negative. He did have symptoms of exertional chest pain. Given recent CVA, it was felt that the patient should avoid cardiac catheterization for at least 2 weeks post CVA . He would ultimately need cardiac catheterization for further evaluation. Because of his stroke, he was seen by Dr. Rayann Heman and underwent implantation of implantable loop recorder. TEE was performed and demonstrated EF of 55% (appeared better than transthoracic echocardiogram). After wait to heal from CVA pt had cath 09/24/14:  Ost RCA lesion, 95% stenosed.  Ost LAD to Prox LAD lesion, 50% stenosed. This portion of the LAD was negative by FFR.  Mid LAD lesion, 75% stenosed. This lesion was significant by FFR. This was successfully treated with a drug-eluting stent to the mid LAD. 3.0 x 20 Synergy , postdilated to 3.3 mm in diameter. There is a 0% residual stenosis post intervention.  Will plan for PCI of the ostial RCA today. The patient will need dual antiplatelet therapy for at least a year.    Subjective: No pain, just did not sleep well last pm.   Objective: Vital signs in last 24 hours: Temp:  [97.6 F (36.4 C)-98 F (36.7 C)] 97.7 F (36.5 C) (05/18 0807) Pulse Rate:  [51-86] 64 (05/18 0807) Resp:  [12-32] 12 (05/18 0807) BP: (112-195)/(43-115) 112/79 mmHg (05/18 0807) SpO2:  [95 %-100 %] 95 % (05/18 0807) Weight:  [196 lb 6.9 oz (89.1 kg)-198 lb 10.2 oz (90.1 kg)] 196 lb 6.9 oz (89.1 kg) (05/18 0044) Weight change:    Intake/Output from previous day:-479 05/17 0701 - 05/18 0700 In: 420.7 [P.O.:360; I.V.:60.7] Out: 900 [Urine:900] Intake/Output this shift:    PE: General:Pleasant affect, NAD Skin:Warm and dry, brisk capillary refill HEENT:normocephalic, sclera clear, mucus membranes  moist Heart:S1S2 RRR without murmur, gallup, rub or click Lungs:clear without rales, rhonchi, or wheezes INO:MVEH, non tender, + BS, do not palpate liver spleen or masses Ext:no lower ext edema, 2+ pedal pulses, 2+ radial pulses Neuro:alert and oriented X 3, MAE, follows commands, + facial symmetry Tele: SR  Lab Results:  Recent Labs  09/25/14 0334  WBC 8.0  HGB 13.8  HCT 41.0  PLT 177   BMET  Recent Labs  09/25/14 0334  NA 138  K 4.1  CL 105  CO2 25  GLUCOSE 95  BUN 12  CREATININE 0.92  CALCIUM 8.7*   No results for input(s): TROPONINI in the last 72 hours.  Invalid input(s): CK, MB  Lab Results  Component Value Date   CHOL 185 07/28/2014   HDL 32* 07/28/2014   LDLCALC 129* 07/28/2014   TRIG 119 07/28/2014   CHOLHDL 5.8 07/28/2014   Lab Results  Component Value Date   HGBA1C 5.3 07/28/2014       Studies/Results: No results found.  Medications: I have reviewed the patient's current medications. Scheduled Meds: . adenosine  16 mL Intravenous Once  . aspirin  81 mg Oral Daily  . aspirin  81 mg Oral Pre-Cath  . atorvastatin  20 mg Oral q1800  . citalopram  10 mg Oral Daily  . clopidogrel  75 mg Oral Q breakfast  . lisinopril  40 mg Oral Daily  . metoprolol succinate  25 mg Oral Daily  . sodium chloride  3 mL Intravenous Q12H  . sodium chloride  3  mL Intravenous Q12H   Continuous Infusions: . sodium chloride    . nitroGLYCERIN 10 mcg/min (09/25/14 0200)   PRN Meds:.sodium chloride, sodium chloride, acetaminophen, morphine injection, nitroGLYCERIN, ondansetron (ZOFRAN) IV, sodium chloride, sodium chloride  Assessment/Plan:  Precordial pain/unstable angina He had worsening chest pain with minimal activity. His symptoms are c/w CCS Class 3 angina. He has a lot of risk factors including known vascular disease, HTN, HL, tobacco abuse and FHx of CAD.  Continue ASA, statin, beta-blocker.Isosorbide 15 mg QD was started. He does not take PDE-5  inhibitors. Dr. Irish Lack discussed PCI with pt last evening including risks.   Essential hypertension BP elevated today. Continue to monitor.   Dyslipidemia Continue statin.  Cerebral infarction due to unspecified mechanism ILR is followed by EP. Has residual numbness in lt hand and wrist and Lt thigh. Continue ASA, statin, ACE inhibitor. FU with Neuro and PT as planned.   Carotid stenosis, bilateral  No high grade plaque on neck CTA. Consider repeat carotid US in 1 year.      Time spent with pt. :15 minutes. [facility] R  Nurse Practitioner Certified Pager [phone] or after 5pm and on weekends call [phone] 09/25/2014, 8:44 AM   Patient seen, examined. Available data reviewed. Agree with findings, assessment, and plan as outlined by Cecilie Kicks, NP. Patient independently interviewed and examined. I have reviewed his heart catheterization films with colleagues. Plans for PCI today. I reviewed the risks, indications, and alternatives to PCI with the patient. He understands and agrees to proceed. His wife and son are at the bedside. All questions were answered.  [name], M.D. 09/25/2014 1:55 PM

## 2014-09-26 DIAGNOSIS — I2510 Atherosclerotic heart disease of native coronary artery without angina pectoris: Secondary | ICD-10-CM

## 2014-09-26 DIAGNOSIS — Z9861 Coronary angioplasty status: Secondary | ICD-10-CM

## 2014-09-26 DIAGNOSIS — I200 Unstable angina: Secondary | ICD-10-CM

## 2014-09-26 DIAGNOSIS — Z95818 Presence of other cardiac implants and grafts: Secondary | ICD-10-CM

## 2014-09-26 LAB — BASIC METABOLIC PANEL
Anion gap: 10 (ref 5–15)
BUN: 12 mg/dL (ref 6–20)
CALCIUM: 8.5 mg/dL — AB (ref 8.9–10.3)
CO2: 22 mmol/L (ref 22–32)
Chloride: 105 mmol/L (ref 101–111)
Creatinine, Ser: 0.75 mg/dL (ref 0.61–1.24)
GFR calc Af Amer: >60 mL/min (ref >60)
GLUCOSE: 76 mg/dL (ref 65–99)
Potassium: 3.7 mmol/L (ref 3.5–5.1)
SODIUM: 137 mmol/L (ref 135–145)

## 2014-09-26 LAB — CBC
HEMATOCRIT: 39.6 % (ref 39.0–52.0)
HEMOGLOBIN: 13.9 g/dL (ref 13.0–17.0)
MCH: 31 pg (ref 26.0–34.0)
MCHC: 35.1 g/dL (ref 30.0–36.0)
MCV: 88.4 fL (ref 78.0–100.0)
Platelets: 190 10*3/uL (ref 150–400)
RBC: 4.48 MIL/uL (ref 4.22–5.81)
RDW: 13.3 % (ref 11.5–15.5)
WBC: 7.8 10*3/uL (ref 4.0–10.5)

## 2014-09-26 MED ORDER — CLOPIDOGREL BISULFATE 75 MG PO TABS: 75.0000 mg | ORAL_TABLET | Freq: Every day | ORAL | Status: DC

## 2014-09-26 MED ORDER — LISINOPRIL 40 MG PO TABS: 40.0000 mg | ORAL_TABLET | Freq: Every day | ORAL | Status: DC

## 2014-09-26 MED ORDER — ACETAMINOPHEN 325 MG PO TABS: 650.0000 mg | ORAL_TABLET | ORAL | Status: DC | PRN

## 2014-09-26 MED ORDER — ASPIRIN 81 MG PO CHEW: 81.0000 mg | CHEWABLE_TABLET | Freq: Every day | ORAL | Status: DC

## 2014-09-26 MED FILL — Heparin Sodium (Porcine) 2 Unit/ML in Sodium Chloride 0.9%: INTRAMUSCULAR | Qty: 1000 | Status: AC

## 2014-09-26 MED FILL — Lidocaine HCl Local Preservative Free (PF) Inj 1%: INTRAMUSCULAR | Qty: 30 | Status: AC

## 2014-09-26 NOTE — Discharge Instructions (Signed)
Coronary Angiogram With Stent, Care After Refer to this sheet in the next few weeks. These instructions provide you with information on caring for yourself after your procedure. Your health care provider may also give you more specific instructions. Your treatment has been planned according to current medical practices, but problems sometimes occur. Call your health care provider if you have any problems or questions after your procedure.  WHAT TO EXPECT AFTER THE PROCEDURE  The insertion site may be tender for a few days after your procedure. HOME CARE INSTRUCTIONS   Take medicines only as directed by your health care provider. Blood thinners may be prescribed after your procedure to improve blood flow through the stent.  Change any bandages (dressings) as directed by your health care provider.   Check your insertion site every day for redness, swelling, or fluid leaking from the insertion.   Do not take baths, swim, or use a hot tub until your health care provider approves. You may shower. Pat the insertion area dry. Do not rub the insertion area with a washcloth or towel.   Eat a heart-healthy diet. This should include plenty of fresh fruits and vegetables. Meat should be lean cuts. Avoid the following types of food:   Food that is high in salt.   Canned or highly processed food.   Food that is high in saturated fat or sugar.   Fried food.   Make any other lifestyle changes recommended by your health care provider. This may include:   Not using any tobacco products including cigarettes, chewing tobacco, or electronic cigarettes.  Managing your weight.   Getting regular exercise.   Managing your blood pressure.   Limiting your alcohol intake.   Managing other health problems, such as diabetes.   If you need an MRI after your heart stent was placed, be sure to tell the health care provider who orders the MRI that you have a heart stent.   Keep all follow-up  visits as directed by your health care provider.  SEEK IMMEDIATE MEDICAL CARE IF:   You develop chest pain, shortness of breath, feel faint, or pass out.  You have bleeding, swelling larger than a walnut, or drainage from the catheter insertion site.  You develop pain, discoloration, coldness, or severe bruising in the leg or arm that held the catheter.  You develop bleeding from any other place such as from the bowels. There may be bright red blood in the urine or stools, or it may appear as black, tarry stools.  You have a fever or chills. MAKE SURE YOU:  Understand these instructions.  Will watch your condition.  Will get help right away if you are not doing well or get worse. Document Released: 11/13/2004 Document Revised: 09/10/2013 Document Reviewed: 09/27/2012 [facility] Patient Information 2015 [place], [place]. This information is not intended to replace advice given to you by your health care provider. Make sure you discuss any questions you have with your health care provider.  STOP ISOSORBIDE

## 2014-09-26 NOTE — Progress Notes (Addendum)
    Subjective:  Feels well. No CP or SOB.   Objective:  Vital Signs in the last 24 hours: Temp:  [97.7 F (36.5 C)-98.3 F (36.8 C)] 98.3 F (36.8 C) (05/19 0641) Pulse Rate:  [0-69] 63 (05/19 0641) Resp:  [0-102] 16 (05/19 0641) BP: (107-166)/(67-95) 162/82 mmHg (05/19 0641) SpO2:  [0 %-100 %] 97 % (05/19 0641) Weight:  [197 lb 5 oz (89.5 kg)] 197 lb 5 oz (89.5 kg) (05/19 0059)  Intake/Output from previous day: 05/18 0701 - 05/19 0700 In: 1416.1 [P.O.:240; I.V.:1176.1] Out: -   Physical Exam: Pt is alert and oriented, NAD HEENT: normal Neck: JVP - normal, carotids 2+= without bruits Lungs: CTA bilaterally CV: RRR without murmur or gallop Abd: soft, NT, Positive BS, no hepatomegaly Ext: no C/C/E, distal pulses intact and equal, radial pulse intact and no hematoma Skin: warm/dry no rash   Lab Results:  Recent Labs  09/25/14 0334  WBC 8.0  HGB 13.8  PLT 177    Recent Labs  09/25/14 0334  NA 138  K 4.1  CL 105  CO2 25  GLUCOSE 95  BUN 12  CREATININE 0.92   No results for input(s): TROPONINI in the last 72 hours.  Invalid input(s): CK, MB  Cardiac Studies: EKG: sinus brady, within normal limits  Assessment/Plan:  1. Crescendo angina: now s/p 2 vessel PCI: plans now for post-PCI medical Rx: ASA, plavix, beta-blocker, statin, ACE. LV fxn is normal.  2. HTN: continue current Rx and outpatient follow-up  3. Tobacco: cessation counseling done - he is optimistic that he can quit  Dispo: home this am. Follow-up Richardson Dopp 1-2 weeks.   [name], M.D. 09/26/2014, 7:18 AM

## 2014-09-26 NOTE — Progress Notes (Signed)
Pt has been flustered this am due to lack of sleep, etc. Declined ambulating but receptive to education. Continued discussion of smoking cessation, NTG, ex, Plavix and CRPII. Pt will be interested in CRPII in about 1 month (after he finishes his PT/OT) and requests referral be sent to [facility], ACSM 9:49 AM 09/26/2014

## 2014-09-26 NOTE — Discharge Summary (Signed)
Patient ID: Daniel [name],  MRN: [id number], DOB/AGE: 63-Aug-1952 63 y.o.  Admit date: 09/24/2014 Discharge date: 09/26/2014  Primary Care Provider:  Melinda Crutch, MD Primary Cardiologist: Dr Irish Lack  Discharge Diagnoses Principal Problem:   Crescendo angina Active Problems:   CAD -S/P LAD DES 09/24/14, RCA DES 09/25/14   CVA  March 2016   Essential hypertension   Tobacco abuse   Dyslipidemia   Hx of loop recorder-March 2016    Procedures: LAD DES 09/24/14                        RCA DES 09/25/14   Hospital Course:  64 y.o. male with a hx of tobacco abuse, and HTN. 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 Echocardiogram demonstrated reduced LV function with an EF of 40-45%. Cardiac enzymes remained negative. He did have symptoms of exertional chest pain. Given recent CVA, it was felt that the patient should avoid cardiac catheterization for at least 2 weeks post CVA . He would ultimately need cardiac catheterization for further evaluation. Because of his stroke, he was seen by Dr. Rayann Heman and underwent implantation of implantable loop recorder. TEE was performed and demonstrated EF of 55% (appeared better than transthoracic echocardiogram).  When he was seen in April in FU he was overall doing well. It was decided to hold off on proceeding with cardiac cath as he was not having any significant angina. Unfortunately, when he returned 09/18/14 for FU he reported , he has developed worsening exertional chest tightness over the past several weeks . He was getting symptoms with minimal activity. It was decided to proceed with cath. This was done 09/24/14 and revealed significant LAD and RCA disease. He underwent LAD DES 09/24/14 and RCA DES 09/25/14. He tolerated this well. Dr Burt Knack saw him on the morning of the 19th and feels he is stable for discharge. His ACE was increased this admission secondary to HTN. He has a FU with Richardson Dopp in two weeks.    Discharge Vitals:    Blood pressure 142/86, pulse 62, temperature 98.1 F (36.7 C), temperature source Oral, resp. rate 16, height 5\' 11"  (1.803 m), weight 197 lb 5 oz (89.5 kg), SpO2 97 %.    Labs: Results for orders placed or performed during the hospital encounter of 09/24/14 (from the past 24 hour(s))  POCT Activated clotting time     Status: None   Collection Time: 09/25/14  2:22 PM  Result Value Ref Range   Activated Clotting Time 306 seconds  Basic metabolic panel     Status: Abnormal   Collection Time: 09/26/14  4:20 AM  Result Value Ref Range   Sodium 137 135 - 145 mmol/L   Potassium 3.7 3.5 - 5.1 mmol/L   Chloride 105 101 - 111 mmol/L   CO2 22 22 - 32 mmol/L   Glucose, Bld 76 65 - 99 mg/dL   BUN 12 6 - 20 mg/dL   Creatinine, Ser 0.75 0.61 - 1.24 mg/dL   Calcium 8.5 (L) 8.9 - 10.3 mg/dL   GFR calc non Af Amer >60 >60 mL/min   GFR calc Af Amer >60 >60 mL/min   Anion gap 10 5 - 15  CBC     Status: None   Collection Time: 09/26/14  4:20 AM  Result Value Ref Range   WBC 7.8 4.0 - 10.5 K/uL   RBC 4.48 4.22 - 5.81 MIL/uL   Hemoglobin 13.9 13.0 - 17.0 g/dL  HCT 39.6 39.0 - 52.0 %   MCV 88.4 78.0 - 100.0 fL   MCH 31.0 26.0 - 34.0 pg   MCHC 35.1 30.0 - 36.0 g/dL   RDW 13.3 11.5 - 15.5 %   Platelets 190 150 - 400 K/uL    Disposition:      Follow-up Information    Follow up with Richardson Dopp, PA-C On 10/14/2014.   Specialties:  Physician Assistant, Radiology, Interventional Cardiology   Why:  2:40 pm   Contact information:   [address] [place] [address] [phone]       Discharge Medications:    Medication List    STOP taking these medications        aspirin 325 MG EC tablet  Replaced by:  aspirin 81 MG chewable tablet     isosorbide mononitrate 30 MG 24 hr tablet  Commonly known as:  IMDUR      TAKE these medications        acetaminophen 325 MG tablet  Commonly known as:  TYLENOL  Take 2 tablets (650 mg total) by mouth every 4 (four) hours as  needed for headache or mild pain.     aspirin 81 MG chewable tablet  Chew 1 tablet (81 mg total) by mouth daily.     atorvastatin 20 MG tablet  Commonly known as:  LIPITOR  Take 1 tablet (20 mg total) by mouth daily at 6 PM.     citalopram 10 MG tablet  Commonly known as:  CELEXA  Take 10 mg by mouth daily.     clopidogrel 75 MG tablet  Commonly known as:  PLAVIX  Take 1 tablet (75 mg total) by mouth daily with breakfast.     lisinopril 40 MG tablet  Commonly known as:  PRINIVIL,ZESTRIL  Take 1 tablet (40 mg total) by mouth daily.     metoprolol succinate 25 MG 24 hr tablet  Commonly known as:  TOPROL-XL  Take 1 tablet (25 mg total) by mouth daily.     multivitamin tablet  Take 1 tablet by mouth daily.     nitroGLYCERIN 0.4 MG SL tablet  Commonly known as:  NITROSTAT  Place 1 tablet (0.4 mg total) under the tongue every 5 (five) minutes as needed.         Duration of Discharge Encounter: Greater than 30 minutes including physician time.  Angelena Form PA-C 09/26/2014 9:54 AM

## 2014-09-30 ENCOUNTER — Ambulatory Visit: Payer: 59 | Admitting: Occupational Therapy

## 2014-09-30 ENCOUNTER — Ambulatory Visit (INDEPENDENT_AMBULATORY_CARE_PROVIDER_SITE_OTHER): Payer: 59 | Admitting: *Deleted

## 2014-09-30 DIAGNOSIS — I639 Cerebral infarction, unspecified: Secondary | ICD-10-CM | POA: Diagnosis not present

## 2014-10-01 LAB — CUP PACEART REMOTE DEVICE CHECK
Date Time Interrogation Session: 20160426212901
Zone Setting Detection Interval: 2000 ms
Zone Setting Detection Interval: 3000 ms
Zone Setting Detection Interval: 360 ms

## 2014-10-01 NOTE — Progress Notes (Signed)
Loop recorder 

## 2014-10-02 ENCOUNTER — Ambulatory Visit: Payer: 59 | Admitting: Occupational Therapy

## 2014-10-08 ENCOUNTER — Telehealth: Payer: Self-pay | Admitting: Occupational Therapy

## 2014-10-08 ENCOUNTER — Ambulatory Visit: Payer: 59 | Admitting: Occupational Therapy

## 2014-10-08 NOTE — Telephone Encounter (Signed)
Called and left message to return call (to discuss multiple no shows and cancellations) for occupational therapy.

## 2014-10-10 ENCOUNTER — Ambulatory Visit: Payer: 59 | Admitting: Occupational Therapy

## 2014-10-11 LAB — CUP PACEART REMOTE DEVICE CHECK
Date Time Interrogation Session: 20160429114702
MDC IDC SET ZONE DETECTION INTERVAL: 3000 ms
Zone Setting Detection Interval: 2000 ms
Zone Setting Detection Interval: 360 ms

## 2014-10-14 ENCOUNTER — Encounter: Payer: Self-pay | Admitting: Physician Assistant

## 2014-10-14 ENCOUNTER — Ambulatory Visit (INDEPENDENT_AMBULATORY_CARE_PROVIDER_SITE_OTHER): Payer: 59 | Admitting: Physician Assistant

## 2014-10-14 VITALS — BP 160/85 | HR 81 | Ht 71.0 in | Wt 194.0 lb

## 2014-10-14 DIAGNOSIS — I10 Essential (primary) hypertension: Secondary | ICD-10-CM

## 2014-10-14 DIAGNOSIS — I639 Cerebral infarction, unspecified: Secondary | ICD-10-CM

## 2014-10-14 DIAGNOSIS — E785 Hyperlipidemia, unspecified: Secondary | ICD-10-CM | POA: Diagnosis not present

## 2014-10-14 DIAGNOSIS — I6523 Occlusion and stenosis of bilateral carotid arteries: Secondary | ICD-10-CM

## 2014-10-14 DIAGNOSIS — I2510 Atherosclerotic heart disease of native coronary artery without angina pectoris: Secondary | ICD-10-CM

## 2014-10-14 LAB — BASIC METABOLIC PANEL
BUN: 16 mg/dL (ref 6–23)
CO2: 29 meq/L (ref 19–32)
Calcium: 10.2 mg/dL (ref 8.4–10.5)
Chloride: 104 mEq/L (ref 96–112)
Creatinine, Ser: 0.94 mg/dL (ref 0.40–1.50)
GFR: 86.04 mL/min (ref >60.00)
Glucose, Bld: 117 mg/dL — ABNORMAL HIGH (ref 70–99)
Potassium: 4.2 mEq/L (ref 3.5–5.1)
Sodium: 139 mEq/L (ref 135–145)

## 2014-10-14 MED ORDER — METOPROLOL SUCCINATE ER 50 MG PO TB24: 50.0000 mg | ORAL_TABLET | Freq: Every day | ORAL | Status: DC

## 2014-10-14 MED ORDER — ATORVASTATIN CALCIUM 40 MG PO TABS: 40.0000 mg | ORAL_TABLET | Freq: Every day | ORAL | Status: DC

## 2014-10-14 NOTE — Patient Instructions (Signed)
Medication Instructions:  1. INCREASE TOPROL XL TO 50 MG DAILY; NEW RX  2. INCREASE LIPITOR TO 40 MG DAILY; NEW RX  Labwork: 1. TODAY BMET  2. FASTING LIPID AND LIVER PANEL TO BE DONE IN 6-8 WEEKS 12/13/14 LAB OPENS 7:30-4:30  Testing/Procedures: NONE  Follow-Up: 12/24/14 @ 4:30 WITH DR. VARANASI  Any Other Special Instructions Will Be Listed Below (If Applicable). CARDIAC REHAB AT [facility]

## 2014-10-14 NOTE — Progress Notes (Signed)
Cardiology Office Note   Date:  10/14/2014   ID:  Daniel [name], DOB 03/10/1951, MRN [id number]  PCP:   Melinda Crutch, MD  Cardiologist:  Dr. Casandra Doffing     Chief Complaint  Patient presents with  . Coronary Artery Disease     History of Present Illness: Daniel [name] is a 64 y.o. male with a hx of tobacco abuse, HTN. 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  Echocardiogram demonstrated reduced LV function with an EF of 40-45%. Cardiac enzymes remained negative.  He did have symptoms of exertional chest pain. Given recent CVA, it was felt that the patient should avoid cardiac catheterization for at least 2 weeks post CVA . He would ultimately need cardiac catheterization for further evaluation.  Because of his stroke, he was seen by Dr. Rayann Heman and underwent implantation of implantable loop recorder.  TEE was performed and demonstrated EF of 55% (appeared better than transthoracic echocardiogram).  When seen in follow-up, he was having worsening exertional angina. We set him up for cardiac catheterization. This was performed on 09/24/14 and demonstrated significant mid LAD stenosis, significant ostial RCA stenosis and a small subtotally occluded OM1 that filled by left to left collaterals. He underwent PCI with DES to the mid LAD. He was brought back the next day for PCI of the RCA with a Promus DES. EF was 55% on cardiac catheterization. Post PCI course was fairly uneventful. ACE inhibitor was increased secondary to uncontrolled hypertension.  He returns for follow-up.  He has been walking 25 minutes Twice daily.  He is feeling good.  He did have a couple episodes of chest pain with walking longer that resolved with NTG.  However, overall, he feels much better.  He denies significant dyspnea.  Denies orthopnea, PND, edema.  No syncope.  No significant cough or wheezing.   Studies/Reports Reviewed Today:  PCI 09/25/14 3.0X16 MM PROMUS DES to proximal RCA  LHC/PCI  09/24/14 LAD:  Ostial to proximal 50%, mid 75% (FFR 0.74), distal 30% LCx:  OM1 ostial 99% RCA:  Ostial 95% EF 65% PCI:  Synergy DES 3 x 20 mm to mid LAD  Echo 07/29/14 - mild LVH. EF 40% to 45%. grade 1 diastolic dysfunction). - Left atrium: The atrium was mildly dilated.  TEE 07/31/14 EF 55% Normal RV size and function Mild LAE No LAA clot Negative bubble study  Neck CTA 07/28/14 Mild bilateral ICA calcific plaque (nothing > 50%)   Past Medical History  Diagnosis Date  . Essential hypertension   . HLD (hyperlipidemia)   . Hx of echocardiogram     a.  Echo 3/16:  mild LVH, EF 40-45%, Gr 1 DD, mild LAE;  b.  TEE 3/16:  EF 55%, mild LAE, no LAA clot, neg bubble study  . Carotid stenosis     a. Neck CTA 3/16:  mild bilat ICA calcific plaque (nothing > 50%)  . CVA (cerebral vascular accident) 07/2014    "left hand and part of that arm weak since" (09/24/2014)  . Depression     "since stroke in 07/2014"   . Basal cell carcinoma of nose ~ 2012    Past Surgical History  Procedure Laterality Date  . Circumcision    . Loop recorder implant N/A 07/31/2014    Procedure: LOOP RECORDER IMPLANT;  Surgeon: [name], MD;  [facility]: [facility] CATH LAB;  Service: Cardiovascular;  Laterality: N/A;  . Tee without cardioversion N/A 07/31/2014    Procedure: TRANSESOPHAGEAL ECHOCARDIOGRAM (TEE);  Surgeon: Larey Dresser, MD;  Location: [facility] ENDOSCOPY;  Service: Cardiovascular;  Laterality: N/A;  . Mohs surgery  ~ 2012    "nose"  . Cardiac catheterization N/A 09/24/2014    Procedure: Left Heart Cath and Coronary Angiography;  Surgeon: Jettie Booze, MD;  Location: [facility] CV LAB;  Service: Cardiovascular;  Laterality: N/A;  . Fractional flow reserve wire  09/24/2014    Procedure: Fractional Flow Reserve Wire;  Surgeon: Jettie Booze, MD;  Location: [facility] CV LAB;  Service: Cardiovascular;;  . Cardiac catheterization N/A 09/24/2014    Procedure: Coronary Stent Intervention;  Surgeon:  Jettie Booze, MD;  Location: [place] CV LAB;  Service: Cardiovascular;  Laterality: N/A;  . Cardiac catheterization N/A 09/25/2014    Procedure: Coronary Stent Intervention;  Surgeon: [name], MD;  Location: [place] CV LAB;  Service: Cardiovascular;  Laterality: N/A;     Current Outpatient Prescriptions  Medication Sig Dispense Refill  . acetaminophen (TYLENOL) 325 MG tablet Take 2 tablets (650 mg total) by mouth every 4 (four) hours as needed for headache or mild pain.    Marland Kitchen aspirin 81 MG chewable tablet Chew 1 tablet (81 mg total) by mouth daily.    Marland Kitchen atorvastatin (LIPITOR) 40 MG tablet Take 1 tablet (40 mg total) by mouth daily at 6 PM. 30 tablet 11  . citalopram (CELEXA) 10 MG tablet Take 10 mg by mouth daily.   1  . clopidogrel (PLAVIX) 75 MG tablet Take 1 tablet (75 mg total) by mouth daily with breakfast. 90 tablet 11  . lisinopril (PRINIVIL,ZESTRIL) 40 MG tablet Take 1 tablet (40 mg total) by mouth daily. 30 tablet 11  . metoprolol succinate (TOPROL-XL) 50 MG 24 hr tablet Take 1 tablet (50 mg total) by mouth daily. 30 tablet 11  . Multiple Vitamin (MULTIVITAMIN) tablet Take 1 tablet by mouth daily.    . nitroGLYCERIN (NITROSTAT) 0.4 MG SL tablet Place 1 tablet (0.4 mg total) under the tongue every 5 (five) minutes as needed. 25 tablet 3   No current facility-administered medications for this visit.    Allergies:   Shrimp    Social History:  The patient  reports that he has been smoking Cigarettes.  He has a 23 pack-year smoking history. He has never used smokeless tobacco. He reports that he drinks alcohol. He reports that he does not use illicit drugs.   Family History:  The patient's family history includes Heart attack in his brother, father, and mother; Heart failure in his brother, father, and paternal uncle; Hypertension in his brother, father, mother, and paternal uncle. There is no history of Stroke.    ROS:   Please see the history of present  illness.   Review of Systems  Constitution: Positive for weakness.  HENT: Positive for headaches.   All other systems reviewed and are negative.    PHYSICAL EXAM: VS:  BP 160/85 mmHg  Pulse 81  Ht 5\' 11"  (1.803 m)  Wt 194 lb (87.998 kg)  BMI 27.07 kg/m2    Wt Readings from Last 3 Encounters:  10/14/14 194 lb (87.998 kg)  09/26/14 197 lb 5 oz (89.5 kg)  09/18/14 198 lb (89.812 kg)     GEN: Well nourished, well developed, in no acute distress HEENT: normal Neck: no JVD, no masses Cardiac:  Normal S1/S2, RRR; no murmur, no rubs or gallops,  no edema; right wrist without hematoma or mass   Respiratory:  clear to auscultation bilaterally, no wheezing,  rhonchi or rales. GI: soft, nontender, nondistended, + BS MS: no deformity or atrophy Skin: warm and dry  Neuro:  CNs II-XII intact, Strength and sensation are intact Psych: Normal affect   EKG:  EKG is ordered today.  It demonstrates:  NSR, HR 81, NSSTTW changes, no change from prior tracing    Recent Labs: 07/27/2014: ALT 32 09/26/2014: Hemoglobin 13.9; Platelets 190 10/14/2014: BUN 16; Creatinine 0.94; Potassium 4.2; Sodium 139    Lipid Panel    Component Value Date/Time   CHOL 185 07/28/2014 0744   TRIG 119 07/28/2014 0744   HDL 32* 07/28/2014 0744   CHOLHDL 5.8 07/28/2014 0744   VLDL 24 07/28/2014 0744   LDLCALC 129* 07/28/2014 0744      ASSESSMENT AND PLAN:  Coronary artery disease:  He is doing well after recent 2 v PCI (LAD and RCA) with a DES.  He has had a couple episodes of angina.  This may be related to the residual disease in the OM1.  However, his symptoms are significantly improved after his PCI.  He is interested in Cardiac Rehab.  He is trying to quit smoking.  We discussed the importance of dual antiplatelet therapy.   -  Refer to Cardiac Rehab.  -  Intensify anti-anginal Rx with increasing Toprol-XL to 50 mg QD.  -  Intensify statin Rx by increasing Lipitor to moderate intensity (40 mg QD).  Check  LFTs and Lipids 6-8 weeks.  -  Continue ASA, statin, Plavix, ACE inhibitor, beta-blocker.   Essential hypertension:  Uncontrolled.  Increase Toprol-XL to 50 mg QD. Continue higher dose ACE inhibitor.  Check BMET today.   Dyslipidemia:  Increase Lipitor to 40 mg QD.  Check Lipids and LFTs 6-8 weeks.   Cerebral infarction due to unspecified mechanism:  ILR is followed by EP.  Last interrogation did not demonstrate atrial fibrillation.  Continue ASA, Plavix, statin, ACE inhibitor.  FU with Neuro and PT as planned.   Carotid stenosis, bilateral:  No high grade plaque on neck CTA.  Consider repeat carotid US in 1 year.   Tobacco Abuse:  He is trying to quit smoking.   Current medicines are reviewed at length with the patient today.  Any concerns are as outlined above.  The following changes have been made:    Increase Toprol-XL to 50 mg QD  Increase Lipitor to 40 mg QD   Labs/ tests ordered today include:  Orders Placed This Encounter  Procedures  . Lipid Profile  . Hepatic function panel  . Basic Metabolic Panel (BMET)  . EKG 12-Lead    Disposition:   FU Dr. Casandra Doffing 2-3 mos.    Signed, Versie Starks, MHS 10/14/2014 4:58 PM    [facility] [address], [place], [place]  [address] Phone: [phone]; Fax: [phone]

## 2014-10-16 ENCOUNTER — Ambulatory Visit: Payer: Self-pay | Admitting: Neurology

## 2014-10-17 ENCOUNTER — Ambulatory Visit (INDEPENDENT_AMBULATORY_CARE_PROVIDER_SITE_OTHER): Payer: 59 | Admitting: Neurology

## 2014-10-17 ENCOUNTER — Encounter: Payer: Self-pay | Admitting: Neurology

## 2014-10-17 VITALS — BP 176/94 | HR 57 | Ht 71.0 in | Wt 196.0 lb

## 2014-10-17 DIAGNOSIS — I639 Cerebral infarction, unspecified: Secondary | ICD-10-CM

## 2014-10-17 DIAGNOSIS — I6340 Cerebral infarction due to embolism of unspecified cerebral artery: Secondary | ICD-10-CM | POA: Diagnosis not present

## 2014-10-17 NOTE — Patient Instructions (Signed)
I had a long d/w patient about his recent stroke, risk for recurrent stroke/TIAs, personally independently reviewed imaging studies and stroke evaluation results and answered questions.Continue aspirin 81 mg orally every day and clopidogrel 75 mg orally every day  for secondary stroke prevention given his recent cardiac stent and maintain strict control of hypertension with blood pressure goal below 130/90, diabetes with hemoglobin A1c goal below 6.5% and lipids with LDL cholesterol goal below 70 mg/dL. I also advised the patient to eat a healthy diet with plenty of whole grains, cereals, fruits and vegetables, exercise regularly and maintain ideal body weight Followup in the future with me in 6 months or call earlier if necessary Stroke Prevention Some medical conditions and behaviors are associated with an increased chance of having a stroke. You may prevent a stroke by making healthy choices and managing medical conditions. HOW CAN I REDUCE MY RISK OF HAVING A STROKE?   Stay physically active. Get at least 30 minutes of activity on most or all days.  Do not smoke. It may also be helpful to avoid exposure to secondhand smoke.  Limit alcohol use. Moderate alcohol use is considered to be:  No more than 2 drinks per day for men.  No more than 1 drink per day for nonpregnant women.  Eat healthy foods. This involves:  Eating 5 or more servings of fruits and vegetables a day.  Making dietary changes that address high blood pressure (hypertension), high cholesterol, diabetes, or obesity.  Manage your cholesterol levels.  Making food choices that are high in fiber and low in saturated fat, trans fat, and cholesterol may control cholesterol levels.  Take any prescribed medicines to control cholesterol as directed by your health care provider.  Manage your diabetes.  Controlling your carbohydrate and sugar intake is recommended to manage diabetes.  Take any prescribed medicines to control  diabetes as directed by your health care provider.  Control your hypertension.  Making food choices that are low in salt (sodium), saturated fat, trans fat, and cholesterol is recommended to manage hypertension.  Take any prescribed medicines to control hypertension as directed by your health care provider.  Maintain a healthy weight.  Reducing calorie intake and making food choices that are low in sodium, saturated fat, trans fat, and cholesterol are recommended to manage weight.  Stop drug abuse.  Avoid taking birth control pills.  Talk to your health care provider about the risks of taking birth control pills if you are over 41 years old, smoke, get migraines, or have ever had a blood clot.  Get evaluated for sleep disorders (sleep apnea).  Talk to your health care provider about getting a sleep evaluation if you snore a lot or have excessive sleepiness.  Take medicines only as directed by your health care provider.  For some people, aspirin or blood thinners (anticoagulants) are helpful in reducing the risk of forming abnormal blood clots that can lead to stroke. If you have the irregular heart rhythm of atrial fibrillation, you should be on a blood thinner unless there is a good reason you cannot take them.  Understand all your medicine instructions.  Make sure that other conditions (such as anemia or atherosclerosis) are addressed. SEEK IMMEDIATE MEDICAL CARE IF:   You have sudden weakness or numbness of the face, arm, or leg, especially on one side of the body.  Your face or eyelid droops to one side.  You have sudden confusion.  You have trouble speaking (aphasia) or understanding.  You  have sudden trouble seeing in one or both eyes.  You have sudden trouble walking.  You have dizziness.  You have a loss of balance or coordination.  You have a sudden, severe headache with no known cause.  You have new chest pain or an irregular heartbeat. Any of these  symptoms may represent a serious problem that is an emergency. Do not wait to see if the symptoms will go away. Get medical help at once. Call your local emergency services (911 in [place] Do not drive yourself to the hospital. Document Released: 06/03/2004 Document Revised: 09/10/2013 Document Reviewed: 10/27/2012 [facility] Patient Information 2015 [place], [place]. This information is not intended to replace advice given to you by your health care provider. Make sure you discuss any questions you have with your health care provider.

## 2014-10-17 NOTE — Progress Notes (Signed)
[facility] [address] [place]. [place] [address] [phone]       OFFICE FOLLOW-UP NOTE  Mr. Daniel [name] Date of Birth:  [birth year] Medical Record Number:  [id number]   HPI: 13 year Caucasian male seen today for first office follow-up visit following hospital admission for stroke on 07/26/14. He presented with sudden onset of left-sided weakness and clumsiness and presented beyond time window for TPA. Initial CT scan that was unremarkable but subsequently MRI scan showed patchy right frontotemporal small MCA branch infarct and MRA showed right M3 branch occlusion. Urine drug screen was negative. Total cholesterol was 185, triglycerides 119, HDL 32 and LDL was elevated at 129 mg percent. He will A1c was 5.3. Transthoracic and transesophageal echocardiogram were both done and did not show any cardiac source of embolism. Patient subsequently had outpatient loop recorder inserted few weeks later. He was rehospitalized with chest pain and discomfort and found to have significant coronary artery disease and underwent cardiac stent by Dr. Irish Lack in May 2016. He is currently on aspirin and Plavix and tolerating it well without significant bleeding and only minor bruising. He feels his left-sided strength has recovered almost completely. This has only minor clumsiness in his left hand. He has finished outpatient physical and occupational therapy and has fully regained all his activities of daily living. He states his blood pressure is usually well controlled though today is elevated in office.  ROS:   14 system review of systems is positive for  cough, constipation, headache and all other systems negative PMH:  Past Medical History  Diagnosis Date  . Essential hypertension   . HLD (hyperlipidemia)   . Hx of echocardiogram     a.  Echo 3/16:  mild LVH, EF 40-45%, Gr 1 DD, mild LAE;  b.  TEE 3/16:  EF 55%, mild LAE, no LAA clot, neg bubble study  . Carotid stenosis     a. Neck  CTA 3/16:  mild bilat ICA calcific plaque (nothing > 50%)  . CVA (cerebral vascular accident) 07/2014    "left hand and part of that arm weak since" (09/24/2014)  . Depression     "since stroke in 07/2014"   . Basal cell carcinoma of nose ~ 2012  . Headache     Social History:  History   Social History  . Marital Status: Married    Spouse Name: N/A  . Number of Children: 1  . Years of Education: 14   Occupational History  . own transportation company    Social History Main Topics  . Smoking status: Current Every Day Smoker -- 0.50 packs/day for 46 years    Types: Cigarettes  . Smokeless tobacco: Never Used     Comment: 10/17/14 cutting back  . Alcohol Use: Yes     Comment: 09/24/2014 "glass of wine or a beer q couple weeks"  . Drug Use: No  . Sexual Activity: Not Currently   Other Topics Concern  . Not on file   Social History Narrative   Married   Right handed   Caffeine use- 1 cup coffee daily    Medications:   Current Outpatient Prescriptions on File Prior to Visit  Medication Sig Dispense Refill  . acetaminophen (TYLENOL) 325 MG tablet Take 2 tablets (650 mg total) by mouth every 4 (four) hours as needed for headache or mild pain.    Marland Kitchen aspirin 81 MG chewable tablet Chew 1 tablet (81 mg total) by mouth daily.    Marland Kitchen  atorvastatin (LIPITOR) 40 MG tablet Take 1 tablet (40 mg total) by mouth daily at 6 PM. 30 tablet 11  . citalopram (CELEXA) 10 MG tablet Take 10 mg by mouth daily.   1  . clopidogrel (PLAVIX) 75 MG tablet Take 1 tablet (75 mg total) by mouth daily with breakfast. 90 tablet 11  . lisinopril (PRINIVIL,ZESTRIL) 40 MG tablet Take 1 tablet (40 mg total) by mouth daily. 30 tablet 11  . metoprolol succinate (TOPROL-XL) 50 MG 24 hr tablet Take 1 tablet (50 mg total) by mouth daily. 30 tablet 11  . Multiple Vitamin (MULTIVITAMIN) tablet Take 1 tablet by mouth daily.    . nitroGLYCERIN (NITROSTAT) 0.4 MG SL tablet Place 1 tablet (0.4 mg total) under the tongue every 5  (five) minutes as needed. 25 tablet 3   No current facility-administered medications on file prior to visit.    Allergies:   Allergies  Allergen Reactions  . Shrimp [Shellfish Allergy] Other (See Comments)    "heart attack symptoms", "chest pain, numbness in arms" about 20 years ago    Physical Exam General: well developed, well nourished middle-aged Caucasian male, seated, in no evident distress Head: head normocephalic and atraumatic.  Neck: supple with no carotid or supraclavicular bruits Cardiovascular: regular rate and rhythm, no murmurs Musculoskeletal: no deformity Skin:  no rash/petichiae Vascular:  Normal pulses all extremities Filed Vitals:   10/17/14 1438  BP: 176/94  Pulse: 57   Neurologic Exam Mental Status: Awake and fully alert. Oriented to place and time. Recent and remote memory intact. Attention span, concentration and fund of knowledge appropriate. Mood and affect appropriate.  Cranial Nerves: Fundoscopic exam reveals sharp disc margins. Pupils equal, briskly reactive to light. Extraocular movements full without nystagmus. Visual fields full to confrontation. Hearing intact. Facial sensation intact. Face, tongue, palate moves normally and symmetrically.  Motor: Normal bulk and tone. Normal strength in all tested extremity muscles. Diminished fine finger movements in the left hand. Orbits right over left upper extremity. Minimum weakness of left grip. Sensory.: intact to touch ,pinprick .position and vibratory sensation.  Coordination: Rapid alternating movements normal in all extremities. Finger-to-nose and heel-to-shin performed accurately bilaterally. Gait and Station: Arises from chair without difficulty. Stance is normal. Gait demonstrates normal stride length and balance . Able to heel, toe and tandem walk without difficulty.  Reflexes: 1+ and symmetric. Toes downgoing.   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 Significant vascular risk factors of hypertension, hyperlipidemia, coronary artery disease status post recent cardiac stent    PLAN: I had a long d/w patient about his recent stroke, risk for recurrent stroke/TIAs, personally independently reviewed imaging studies and stroke evaluation results and answered questions.Continue aspirin 81 mg orally every day and clopidogrel 75 mg orally every day  for secondary stroke prevention given his recent cardiac stent and maintain strict control of hypertension with blood pressure goal below 130/90, diabetes with hemoglobin A1c goal below 6.5% and lipids with LDL cholesterol goal below 70 mg/dL. I also advised the patient to eat a healthy diet with plenty of whole grains, cereals, fruits and vegetables, exercise regularly and maintain ideal body weight Followup in the future with me in 6 months or call earlier if necessary  Antony Contras, MD  Note: This document was prepared with digital dictation and possible smart phrase technology. Any transcriptional errors that  result from this process are unintentional

## 2014-10-21 ENCOUNTER — Encounter: Payer: Self-pay | Admitting: Internal Medicine

## 2014-10-24 ENCOUNTER — Encounter: Payer: Self-pay | Admitting: Occupational Therapy

## 2014-10-24 NOTE — Therapy (Signed)
[facility] [address] [place], [place], [address] Phone: [phone]   Fax:  [phone]  Patient Details  Name: Daniel [name] MRN: [id number] Date of Birth: June 15, 1950 Referring Provider:  No ref. provider found  Encounter Date: 10/24/2014   OCCUPATIONAL THERAPY DISCHARGE SUMMARY  Visits from Start of Care: 2  Current functional level related to goals / functional outcomes: Pt did not meet any STG's or LTG's from evaluation on 09/03/14 secondary to not returning after 2nd visit on 09/12/14. (Pt had no showed 09/16/14 appt, cancelled 09/30/14 and 10/02/14 appt, and no showed on 10/08/14, then called to cancel remaining appts after therapist had called and left message)    Remaining deficits: Same as initial evaluation including: cognitive deficits   Education / Equipment: None secondary to not returning after 2nd visit on 09/12/14.  Plan: Patient agrees to discharge.  Patient goals were not met. Patient is being discharged due to not returning since the last visit.  ?????        Carey Bullocks, OTR/L 10/24/2014, 11:33 AM  [facility] [address] [place], [place], [address] Phone: [phone]   Fax:  [phone]

## 2014-10-29 ENCOUNTER — Ambulatory Visit (INDEPENDENT_AMBULATORY_CARE_PROVIDER_SITE_OTHER): Payer: 59 | Admitting: *Deleted

## 2014-10-29 ENCOUNTER — Encounter: Payer: 59 | Admitting: Occupational Therapy

## 2014-10-29 ENCOUNTER — Telehealth (HOSPITAL_COMMUNITY): Payer: Self-pay | Admitting: *Deleted

## 2014-10-29 DIAGNOSIS — I639 Cerebral infarction, unspecified: Secondary | ICD-10-CM

## 2014-10-30 NOTE — Progress Notes (Signed)
Loop recorder 

## 2014-10-31 ENCOUNTER — Ambulatory Visit (HOSPITAL_COMMUNITY): Payer: 59

## 2014-11-04 ENCOUNTER — Encounter (HOSPITAL_COMMUNITY): Payer: 59

## 2014-11-05 ENCOUNTER — Encounter: Payer: Self-pay | Admitting: Internal Medicine

## 2014-11-05 LAB — CUP PACEART REMOTE DEVICE CHECK
Date Time Interrogation Session: 20160610163258
MDC IDC SET ZONE DETECTION INTERVAL: 2000 ms
MDC IDC SET ZONE DETECTION INTERVAL: 3000 ms
Zone Setting Detection Interval: 360 ms

## 2014-11-06 ENCOUNTER — Encounter (HOSPITAL_COMMUNITY): Payer: 59

## 2014-11-08 ENCOUNTER — Encounter (HOSPITAL_COMMUNITY): Payer: 59

## 2014-11-12 ENCOUNTER — Ambulatory Visit (INDEPENDENT_AMBULATORY_CARE_PROVIDER_SITE_OTHER): Payer: 59 | Admitting: *Deleted

## 2014-11-12 VITALS — BP 136/68 | HR 53 | Wt 192.4 lb

## 2014-11-12 DIAGNOSIS — I10 Essential (primary) hypertension: Secondary | ICD-10-CM

## 2014-11-12 NOTE — Progress Notes (Signed)
Patient in today for nurse room visit for BP check s/p medication changes at last OV on 10/14/14.  Previous BP on 10/14/14 was 176/94.  Patient does not routinely check his BP. He was supposed to be doing Cardiac Rehab but was not able to start/continue due to elevated BP this month. Today his BP is 136/68, HR 53 regular and strong. Denies HA, dizziness, CP or other cardiac sx. Patient continues to walk daily at home and denies exertional angina sx. Information and today's VS reviewed by Lemmie Evens PA.  Patient advised that it is now okay to start Cardiac Rehab and he should monitor BP on days he is not at Cardiac Rehab. He is to call the office back for any return of elevated BP or dizziness, CP, HA or other signs of hypertension or angina. Patient verbalized understanding. Called to Cardiac Rehab to inform them that patient can start Cardiac Rehab at this time. No answer so LM on VM.

## 2014-11-13 ENCOUNTER — Encounter (HOSPITAL_COMMUNITY): Payer: 59

## 2014-11-14 ENCOUNTER — Ambulatory Visit (HOSPITAL_COMMUNITY): Payer: 59

## 2014-11-15 ENCOUNTER — Ambulatory Visit: Payer: 59 | Admitting: Physical Therapy

## 2014-11-15 ENCOUNTER — Encounter (HOSPITAL_COMMUNITY): Payer: 59

## 2014-11-15 ENCOUNTER — Encounter: Payer: 59 | Admitting: Occupational Therapy

## 2014-11-18 ENCOUNTER — Encounter (HOSPITAL_COMMUNITY): Payer: 59

## 2014-11-19 ENCOUNTER — Encounter: Payer: Self-pay | Admitting: Internal Medicine

## 2014-11-20 ENCOUNTER — Encounter (HOSPITAL_COMMUNITY): Payer: 59

## 2014-11-20 ENCOUNTER — Encounter: Payer: 59 | Admitting: Occupational Therapy

## 2014-11-20 ENCOUNTER — Ambulatory Visit: Payer: 59 | Admitting: Physical Therapy

## 2014-11-22 ENCOUNTER — Ambulatory Visit: Payer: 59 | Admitting: Physical Therapy

## 2014-11-22 ENCOUNTER — Encounter (HOSPITAL_COMMUNITY): Payer: 59

## 2014-11-22 ENCOUNTER — Encounter: Payer: 59 | Admitting: Occupational Therapy

## 2014-11-25 ENCOUNTER — Encounter (HOSPITAL_COMMUNITY): Payer: 59

## 2014-11-26 ENCOUNTER — Encounter: Payer: 59 | Admitting: Occupational Therapy

## 2014-11-26 ENCOUNTER — Ambulatory Visit: Payer: 59 | Admitting: Physical Therapy

## 2014-11-27 ENCOUNTER — Encounter (HOSPITAL_COMMUNITY): Payer: 59

## 2014-11-28 ENCOUNTER — Ambulatory Visit (INDEPENDENT_AMBULATORY_CARE_PROVIDER_SITE_OTHER): Payer: 59

## 2014-11-28 ENCOUNTER — Encounter (HOSPITAL_COMMUNITY)
Admission: RE | Admit: 2014-11-28 | Discharge: 2014-11-28 | Disposition: A | Discharge status: Home / Self Care | Payer: 59 | Source: Ambulatory Visit | Attending: Interventional Cardiology | Admitting: Interventional Cardiology

## 2014-11-28 ENCOUNTER — Ambulatory Visit: Payer: 59 | Admitting: Physical Therapy

## 2014-11-28 ENCOUNTER — Other Ambulatory Visit: Payer: Self-pay | Admitting: Internal Medicine

## 2014-11-28 ENCOUNTER — Encounter: Payer: 59 | Admitting: Occupational Therapy

## 2014-11-28 DIAGNOSIS — I639 Cerebral infarction, unspecified: Secondary | ICD-10-CM

## 2014-11-28 DIAGNOSIS — Z955 Presence of coronary angioplasty implant and graft: Secondary | ICD-10-CM | POA: Insufficient documentation

## 2014-11-28 DIAGNOSIS — Z48812 Encounter for surgical aftercare following surgery on the circulatory system: Secondary | ICD-10-CM | POA: Insufficient documentation

## 2014-11-28 LAB — CUP PACEART REMOTE DEVICE CHECK: Date Time Interrogation Session: 20160812160022

## 2014-11-28 NOTE — Progress Notes (Signed)
Cardiac Rehab Medication Review by a Pharmacist  Does the patient  feel that his/her medications are working for him/her?  yes  Has the patient been experiencing any side effects to the medications prescribed?  yes  Does the patient measure his/her own blood pressure or blood glucose at home?  yes  (at [facility])  Does the patient have any problems obtaining medications due to transportation or finances?   no  Understanding of regimen: excellent Understanding of indications: excellent Potential of compliance: excellent    Pharmacist comments: Pt was aware of his medication regimen and has not experienced any side effects to his regimen since his MD decreased his Celexa. Pt does have chest pain a couple times a week when walking but it is relieved by NTG.     Kem Parkinson, PharmD Pharmacy Resident 11/28/2014 8:46 AM

## 2014-11-29 ENCOUNTER — Encounter (HOSPITAL_COMMUNITY): Payer: 59

## 2014-12-02 ENCOUNTER — Encounter (HOSPITAL_COMMUNITY): Payer: 59

## 2014-12-02 ENCOUNTER — Encounter (HOSPITAL_COMMUNITY): Admission: RE | Admit: 2014-12-02 | Payer: 59 | Source: Ambulatory Visit

## 2014-12-03 ENCOUNTER — Encounter: Payer: 59 | Admitting: Occupational Therapy

## 2014-12-03 ENCOUNTER — Ambulatory Visit: Payer: 59 | Admitting: Physical Therapy

## 2014-12-04 ENCOUNTER — Encounter (HOSPITAL_COMMUNITY)
Admission: RE | Admit: 2014-12-04 | Discharge: 2014-12-04 | Disposition: A | Discharge status: Home / Self Care | Payer: 59 | Source: Ambulatory Visit | Attending: Interventional Cardiology | Admitting: Interventional Cardiology

## 2014-12-04 ENCOUNTER — Encounter (HOSPITAL_COMMUNITY): Payer: 59

## 2014-12-04 DIAGNOSIS — Z955 Presence of coronary angioplasty implant and graft: Secondary | ICD-10-CM | POA: Diagnosis not present

## 2014-12-04 DIAGNOSIS — Z48812 Encounter for surgical aftercare following surgery on the circulatory system: Secondary | ICD-10-CM | POA: Diagnosis present

## 2014-12-04 NOTE — Progress Notes (Signed)
Pt started cardiac rehab today.  Pt tolerated light exercise without difficulty. Initial blood pressure 160/60 exit blood pressure 118/70 telemetry-Sinus Rhtyhm, asymptomatic.  Medication list reconciled.  Pt verbalized compliance with medications and denies barriers to compliance. PSYCHOSOCIAL ASSESSMENT:  PHQ-0. Pt exhibits positive coping skills, hopeful outlook with supportive family. No psychosocial needs identified at this time, no psychosocial interventions necessary.    Pt enjoys gardening and playing with his cats .   Pt cardiac rehab  goal is  to maintain blood pressure readings and to increase mobility.  Pt encouraged to participate in exercising on your own and risk factors for heart dsease on  to increase ability to achieve these goals.   Pt long term cardiac rehab goal is maintain a good healthy lifestyle.Marland Kitchen  Pt oriented to exercise equipment and routine.  Understanding verbalized. Will continue to monitor the patient throughout  the program.

## 2014-12-05 ENCOUNTER — Ambulatory Visit: Payer: 59 | Admitting: Physical Therapy

## 2014-12-06 ENCOUNTER — Encounter (HOSPITAL_COMMUNITY): Payer: 59

## 2014-12-06 ENCOUNTER — Telehealth (HOSPITAL_COMMUNITY): Payer: Self-pay | Admitting: Family Medicine

## 2014-12-09 ENCOUNTER — Encounter (HOSPITAL_COMMUNITY): Payer: 59

## 2014-12-09 ENCOUNTER — Other Ambulatory Visit: Payer: Self-pay | Admitting: Physician Assistant

## 2014-12-09 ENCOUNTER — Encounter (HOSPITAL_COMMUNITY)
Admission: RE | Admit: 2014-12-09 | Discharge: 2014-12-09 | Disposition: A | Discharge status: Home / Self Care | Payer: 59 | Source: Ambulatory Visit | Attending: Interventional Cardiology | Admitting: Interventional Cardiology

## 2014-12-09 DIAGNOSIS — Z48812 Encounter for surgical aftercare following surgery on the circulatory system: Secondary | ICD-10-CM | POA: Insufficient documentation

## 2014-12-09 DIAGNOSIS — Z955 Presence of coronary angioplasty implant and graft: Secondary | ICD-10-CM | POA: Insufficient documentation

## 2014-12-09 NOTE — Progress Notes (Signed)
QUALITY OF LIFE SCORE REVIEW Patient score low in area of health and functioning  with a score of 19.  Scores less than 21 are considered low.  Patient quality of life slightly altered by physical constraints which limits ability to perform as prior to recent cardiac illness and CVA.  Pt is most concerned with ongoing hand sensitivity, aching and numbness.  Pt denies current angina and feels his medication regimen is very effectively treating it presently.  Overall pt is positive and optimistic with caring family and career he enjoys. 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  Pt is somewhat concerned about his work constraints that will prohibit his ability to attend rehab.  Although pt reports he is motivated to make sacrifices to actively participate.    Offered emotional support and reassurance.  Will continue to monitor and intervene as necessary.

## 2014-12-10 ENCOUNTER — Ambulatory Visit: Payer: 59 | Admitting: Physical Therapy

## 2014-12-10 ENCOUNTER — Other Ambulatory Visit: Payer: Self-pay | Admitting: *Deleted

## 2014-12-10 ENCOUNTER — Encounter: Payer: 59 | Admitting: Occupational Therapy

## 2014-12-11 ENCOUNTER — Encounter (HOSPITAL_COMMUNITY): Payer: 59

## 2014-12-11 NOTE — Progress Notes (Signed)
Loop recorder 

## 2014-12-12 ENCOUNTER — Ambulatory Visit: Payer: 59 | Admitting: Physical Therapy

## 2014-12-12 ENCOUNTER — Encounter: Payer: 59 | Admitting: Occupational Therapy

## 2014-12-13 ENCOUNTER — Other Ambulatory Visit (INDEPENDENT_AMBULATORY_CARE_PROVIDER_SITE_OTHER): Payer: 59 | Admitting: *Deleted

## 2014-12-13 ENCOUNTER — Encounter (HOSPITAL_COMMUNITY): Payer: 59

## 2014-12-13 DIAGNOSIS — E785 Hyperlipidemia, unspecified: Secondary | ICD-10-CM

## 2014-12-13 LAB — HEPATIC FUNCTION PANEL
ALBUMIN: 3.9 g/dL (ref 3.5–5.2)
ALT: 53 U/L (ref 0–53)
AST: 29 U/L (ref 0–37)
Alkaline Phosphatase: 74 U/L (ref 39–117)
Bilirubin, Direct: 0.1 mg/dL (ref 0.0–0.3)
TOTAL PROTEIN: 6 g/dL (ref 6.0–8.3)
Total Bilirubin: 0.5 mg/dL (ref 0.2–1.2)

## 2014-12-13 LAB — LIPID PANEL
CHOLESTEROL: 92 mg/dL (ref 0–200)
HDL: 26 mg/dL — ABNORMAL LOW (ref >39.00)
LDL Cholesterol: 54 mg/dL (ref 0–99)
NONHDL: 65.57
Total CHOL/HDL Ratio: 4
Triglycerides: 58 mg/dL (ref 0.0–149.0)
VLDL: 11.6 mg/dL (ref 0.0–40.0)

## 2014-12-16 ENCOUNTER — Encounter (HOSPITAL_COMMUNITY)
Admission: RE | Admit: 2014-12-16 | Discharge: 2014-12-16 | Disposition: A | Discharge status: Home / Self Care | Payer: 59 | Source: Ambulatory Visit | Attending: Interventional Cardiology | Admitting: Interventional Cardiology

## 2014-12-16 ENCOUNTER — Encounter (HOSPITAL_COMMUNITY): Payer: 59

## 2014-12-16 DIAGNOSIS — Z48812 Encounter for surgical aftercare following surgery on the circulatory system: Secondary | ICD-10-CM | POA: Diagnosis not present

## 2014-12-16 NOTE — Progress Notes (Signed)
Daniel [name] 64 y.o. male Nutrition Note Spoke with pt. Nutrition Survey reviewed with pt. Pt is following Step 1 of the Therapeutic Lifestyle Changes diet. Pt wants to lose wt. Pt has been Wt loss tips briefly discussed. Pt expressed understanding of the information reviewed. Pt aware of nutrition education classes offered. Lab Results  Component Value Date   HGBA1C 5.3 07/28/2014   Wt Readings from Last 3 Encounters:  11/28/14 188 lb 15 oz (85.7 kg)  11/12/14 192 lb 6.4 oz (87.272 kg)  10/17/14 196 lb (88.905 kg)   Nutrition Diagnosis ? Food-and nutrition-related knowledge deficit related to lack of exposure to information as related to diagnosis of: ? CVD ? Overweight related to excessive energy intake as evidenced by a BMI of 27.7  Nutrition Intervention ? Benefits of adopting Therapeutic Lifestyle Changes discussed when Medficts reviewed. ? Pt to attend the Portion Distortion class ? Pt to attend the  ? Nutrition I class                      ? Nutrition II class ? Pt given handouts for: ? Nutrition I class ? Nutrition II class ? Continue client-centered nutrition education by RD, as part of interdisciplinary care.  Goal(s) ? Pt to identify and limit food sources of saturated fat, trans fat, and cholesterol ? Pt to identify food quantities necessary to achieve: ? wt loss to a goal wt of 164-182 lb (74.8-83.0 kg) at graduation from cardiac rehab.   Monitor and Evaluate progress toward nutrition goal with team.  Derek Mound, M.Ed, RD, LDN, CDE 12/16/2014 3:01 PM

## 2014-12-17 ENCOUNTER — Telehealth: Payer: Self-pay | Admitting: *Deleted

## 2014-12-17 NOTE — Telephone Encounter (Signed)
Pt notified of lab reuslts by phone with verbal understanding

## 2014-12-18 ENCOUNTER — Encounter (HOSPITAL_COMMUNITY): Payer: 59

## 2014-12-18 ENCOUNTER — Encounter: Payer: Self-pay | Admitting: Cardiology

## 2014-12-18 ENCOUNTER — Encounter (HOSPITAL_COMMUNITY)
Admission: RE | Admit: 2014-12-18 | Discharge: 2014-12-18 | Disposition: A | Discharge status: Home / Self Care | Payer: 59 | Source: Ambulatory Visit | Attending: Interventional Cardiology | Admitting: Interventional Cardiology

## 2014-12-18 DIAGNOSIS — Z48812 Encounter for surgical aftercare following surgery on the circulatory system: Secondary | ICD-10-CM | POA: Diagnosis not present

## 2014-12-18 NOTE — Progress Notes (Signed)
Pt hypertensive with exertion today at cardiac rehab. Pt reports he was encountered situational stress on way to rehab, late arriving and felt flustered with checking in routine.  Pt denies missed meds or other change in routine/diet. Pt BP improved with continued exercise.  Will continue to monitor.

## 2014-12-20 ENCOUNTER — Encounter (HOSPITAL_COMMUNITY): Payer: 59

## 2014-12-23 ENCOUNTER — Encounter (HOSPITAL_COMMUNITY): Payer: 59

## 2014-12-24 ENCOUNTER — Encounter: Payer: Self-pay | Admitting: Interventional Cardiology

## 2014-12-24 ENCOUNTER — Ambulatory Visit (INDEPENDENT_AMBULATORY_CARE_PROVIDER_SITE_OTHER): Payer: 59 | Admitting: Interventional Cardiology

## 2014-12-24 VITALS — BP 126/78 | HR 56 | Ht 71.0 in | Wt 188.0 lb

## 2014-12-24 DIAGNOSIS — E785 Hyperlipidemia, unspecified: Secondary | ICD-10-CM | POA: Diagnosis not present

## 2014-12-24 DIAGNOSIS — Z720 Tobacco use: Secondary | ICD-10-CM

## 2014-12-24 DIAGNOSIS — I10 Essential (primary) hypertension: Secondary | ICD-10-CM

## 2014-12-24 DIAGNOSIS — Z9861 Coronary angioplasty status: Secondary | ICD-10-CM

## 2014-12-24 DIAGNOSIS — I2510 Atherosclerotic heart disease of native coronary artery without angina pectoris: Secondary | ICD-10-CM

## 2014-12-24 NOTE — Patient Instructions (Signed)
**Note De-Identified  Obfuscation** Medication Instructions:  Same-no changes  Labwork: None  Testing/Procedures: None  Follow-Up: Your physician wants you to follow-up in: 6 months. You will receive a reminder letter in the mail two months in advance. If you don't receive a letter, please call our office to schedule the follow-up appointment.

## 2014-12-24 NOTE — Progress Notes (Signed)
Patient ID: Daniel [name], male   DOB: 12-15-1950, 64 y.o.   MRN: [id number]     Cardiology Office Note   Date:  12/24/2014   ID:  Daniel [name], DOB 1950-10-29, MRN [id number]  PCP:   Melinda Crutch, MD    No chief complaint on file.  follow-up coronary artery disease   Wt Readings from Last 3 Encounters:  12/24/14 188 lb (85.276 kg)  11/28/14 188 lb 15 oz (85.7 kg)  11/12/14 192 lb 6.4 oz (87.272 kg)       History of Present Illness: Daniel [name] is a 64 y.o. male  with a hx of tobacco abuse, HTN. 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 Echocardiogram demonstrated reduced LV function with an EF of 40-45%. Cardiac enzymes remained negative. He did have symptoms of exertional chest pain.  cardiac catheterization. This was performed on 09/24/14 and demonstrated significant mid LAD stenosis, significant ostial RCA stenosis and a small subtotally occluded OM1 that filled by left to left collaterals. He underwent PCI with DES to the mid LAD. He was brought back the next day for PCI of the RCA with a Promus DES. EF was 55% on cardiac catheterization. Post PCI course was fairly uneventful. ACE inhibitor was increased secondary to uncontrolled hypertension.  Overall, he has been doing well. He walks 90 minutes a day. Rarely, he can have some chest discomfort in the middle of the walk. He takes one nitroglycerin and then resumes the walk. He has never had to stop early. It does not happen very often. He thinks he had been having angina long before the CVA. He never sought attention for it.  After the CVA, he has a temperature sensation change in his left arm. No weakness.      Past Medical History  Diagnosis Date  . Essential hypertension   . HLD (hyperlipidemia)   . Hx of echocardiogram     a.  Echo 3/16:  mild LVH, EF 40-45%, Gr 1 DD, mild LAE;  b.  TEE 3/16:  EF 55%, mild LAE, no LAA clot, neg bubble study  . Carotid stenosis     a. Neck CTA 3/16:  mild bilat ICA  calcific plaque (nothing > 50%)  . CVA (cerebral vascular accident) 07/2014    "left hand and part of that arm weak since" (09/24/2014)  . Depression     "since stroke in 07/2014"   . Basal cell carcinoma of nose ~ 2012  . Headache     Past Surgical History  Procedure Laterality Date  . Circumcision    . Loop recorder implant N/A 07/31/2014    Procedure: LOOP RECORDER IMPLANT;  Surgeon: [name], MD;  [facility] C[facility] LAB;  Service: Cardiovascular;  Laterality: N/A;  . Tee without cardioversion N/A 07/31/2014    Procedure: TRANSESOPHAGEAL ECHOCARDIOGRAM (TEE);  Surgeon: Larey Dresser, MD;  Location: [facility] ENDOSCOPY;  Service: Cardiovascular;  Laterality: N/A;  . Mohs surgery  ~ 2012    "nose"  . Cardiac catheterization N/A 09/24/2014    Procedure: Left Heart Cath and Coronary Angiography;  Surgeon: Jettie Booze, MD;  Location: [facility] CV LAB;  Service: Cardiovascular;  Laterality: N/A;  . Fractional flow reserve wire  09/24/2014    Procedure: Fractional Flow Reserve Wire;  Surgeon: Jettie Booze, MD;  Location: [facility] CV LAB;  Service: Cardiovascular;;  . Cardiac catheterization N/A 09/24/2014    Procedure: Coronary Stent Intervention;  Surgeon: Jettie Booze, MD;  Location:  [facility] INVASIVE CV LAB;  Service: Cardiovascular;  Laterality: N/A;  . Cardiac catheterization N/A 09/25/2014    Procedure: Coronary Stent Intervention;  Surgeon: [name], MD;  Location: [facility] CV LAB;  Service: Cardiovascular;  Laterality: N/A;     Current Outpatient Prescriptions  Medication Sig Dispense Refill  . acetaminophen (TYLENOL) 325 MG tablet Take 2 tablets (650 mg total) by mouth every 4 (four) hours as needed for headache or mild pain.    Marland Kitchen aspirin 81 MG chewable tablet Chew 1 tablet (81 mg total) by mouth daily.    Marland Kitchen atorvastatin (LIPITOR) 40 MG tablet Take 1 tablet (40 mg total) by mouth daily at 6 PM. 30 tablet 11  . citalopram (CELEXA) 10 MG tablet Take 10 mg by  mouth daily.   1  . clopidogrel (PLAVIX) 75 MG tablet Take 1 tablet (75 mg total) by mouth daily with breakfast. 90 tablet 11  . lisinopril (PRINIVIL,ZESTRIL) 40 MG tablet Take 1 tablet (40 mg total) by mouth daily. 30 tablet 11  . metoprolol succinate (TOPROL-XL) 50 MG 24 hr tablet Take 1 tablet (50 mg total) by mouth daily. 30 tablet 11  . Multiple Vitamin (MULTIVITAMIN) tablet Take 1 tablet by mouth daily.    . nitroGLYCERIN (NITROSTAT) 0.4 MG SL tablet Place 0.4 mg under the tongue every 5 (five) minutes as needed for chest pain.     No current facility-administered medications for this visit.    Allergies:   Shrimp    Social History:  The patient  reports that he has been smoking Cigarettes.  He has a 23 pack-year smoking history. He has never used smokeless tobacco. He reports that he drinks alcohol. He reports that he does not use illicit drugs.   Family History:  The patient's family history includes Heart attack in his brother, father, and mother; Heart failure in his brother, father, and paternal uncle; Hypertension in his brother, father, mother, and paternal uncle. There is no history of Stroke.    ROS:  Please see the history of present illness.   Otherwise, review of systems are positive for very rare chest discomfort.   All other systems are reviewed and negative.    PHYSICAL EXAM: VS:  BP 126/78 mmHg  Pulse 56  Ht 5\' 11"  (1.803 m)  Wt 188 lb (85.276 kg)  BMI 26.23 kg/m2 , BMI Body mass index is 26.23 kg/(m^2). GEN: Well nourished, well developed, in no acute distress HEENT: normal Neck: no JVD, carotid bruits, or masses Cardiac: RRR; no murmurs, rubs, or gallops,no edema  Respiratory:  clear to auscultation bilaterally, normal work of breathing GI: soft, nontender, nondistended, + BS MS: no deformity or atrophy Skin: warm and dry, no rash Neuro:  Strength and sensation are intact Psych: euthymic mood, full affect     Recent Labs: 09/26/2014: Hemoglobin 13.9;  Platelets 190 10/14/2014: BUN 16; Creatinine, Ser 0.94; Potassium 4.2; Sodium 139 12/13/2014: ALT 53   Lipid Panel    Component Value Date/Time   CHOL 92 12/13/2014 0816   TRIG 58.0 12/13/2014 0816   HDL 26.00* 12/13/2014 0816   CHOLHDL 4 12/13/2014 0816   VLDL 11.6 12/13/2014 0816   LDLCALC 54 12/13/2014 0816     Other studies Reviewed: Additional studies/ records that were reviewed today with results demonstrating: Cath report.   ASSESSMENT AND PLAN:  1. CAD: Minimal anginal symptoms. We discussed starting him door versus continuing when necessary nitroglycerin. Given the infrequency of his symptoms, he would like to discontinue  the sublingual nitroglycerin. I think is reasonable.  LDL controlled earlier this month. Continue statin.  If anginal symptoms become more frequent or exercise tolerance drops, he will let us know. 2. Hypertension: Blood pressure better control. Continue current medicines. 3. Tobacco abuse: The patient was counseled on the dangers of tobacco use, both inhaled and oral, which include, but are not limited to cardiovascular disease, increased cancer risk of multiple types of cancer, COPD, peripheral vascular disease, strokes. He was also counseled on the benefits of smoking cessation. The patient was firmly advised to quit.    We also reviewed strategies to maximize success, including: Removing cigarettes and smoking materials from environment Stress management Substitution of other forms of reinforcement Support of family/friends. Selecting a quit date. Patient provided contact information for 1-800-QUIT-NOW     Current medicines are reviewed at length with the patient today.  The patient concerns regarding his medicines were addressed.  The following changes have been made:  No change  Labs/ tests ordered today include:  No orders of the defined types were placed in this encounter.    Recommend 150 minutes/week of aerobic exercise Low fat, low carb,  high fiber diet recommended  Disposition:   FU in   6 months Teresita Madura., MD  12/24/2014 5:01 PM    [facility] [place], [place], [place]  [address] Phone: [phone]; Fax: [phone]

## 2014-12-25 ENCOUNTER — Encounter (HOSPITAL_COMMUNITY)
Admission: RE | Admit: 2014-12-25 | Discharge: 2014-12-25 | Disposition: A | Discharge status: Home / Self Care | Payer: 59 | Source: Ambulatory Visit | Attending: Interventional Cardiology | Admitting: Interventional Cardiology

## 2014-12-25 ENCOUNTER — Encounter (HOSPITAL_COMMUNITY): Payer: 59

## 2014-12-25 ENCOUNTER — Encounter: Payer: Self-pay | Admitting: Cardiology

## 2014-12-25 DIAGNOSIS — Z48812 Encounter for surgical aftercare following surgery on the circulatory system: Secondary | ICD-10-CM | POA: Diagnosis not present

## 2014-12-25 NOTE — Progress Notes (Signed)
No psychosocial needs identified, no intervention necessary. Pt is exercising on his own at home, walking. Pt cardiac rehab attendance has been limited by his work schedule. Pt is hopeful to be able to free his schedule to be more consistent with attendance.  Pt does exhibit some confusion with details at times.  However pt is able to recall information .  Will continue to monitor and notify PCP if symptoms change or worsen.

## 2014-12-27 ENCOUNTER — Encounter (HOSPITAL_COMMUNITY): Payer: 59

## 2014-12-27 ENCOUNTER — Other Ambulatory Visit: Payer: Self-pay | Admitting: Internal Medicine

## 2014-12-27 ENCOUNTER — Ambulatory Visit (INDEPENDENT_AMBULATORY_CARE_PROVIDER_SITE_OTHER): Payer: 59 | Admitting: *Deleted

## 2014-12-27 ENCOUNTER — Encounter (HOSPITAL_COMMUNITY)
Admission: RE | Admit: 2014-12-27 | Discharge: 2014-12-27 | Disposition: A | Discharge status: Home / Self Care | Payer: 59 | Source: Ambulatory Visit | Attending: Interventional Cardiology | Admitting: Interventional Cardiology

## 2014-12-27 ENCOUNTER — Encounter: Payer: Self-pay | Admitting: Internal Medicine

## 2014-12-27 DIAGNOSIS — I639 Cerebral infarction, unspecified: Secondary | ICD-10-CM | POA: Diagnosis not present

## 2014-12-27 DIAGNOSIS — Z48812 Encounter for surgical aftercare following surgery on the circulatory system: Secondary | ICD-10-CM | POA: Diagnosis not present

## 2014-12-27 LAB — CUP PACEART REMOTE DEVICE CHECK
Date Time Interrogation Session: 20160825101515
MDC IDC SESS DTM: 20160902105220

## 2014-12-30 ENCOUNTER — Encounter (HOSPITAL_COMMUNITY): Payer: 59

## 2014-12-30 ENCOUNTER — Encounter (HOSPITAL_COMMUNITY)
Admission: RE | Admit: 2014-12-30 | Discharge: 2014-12-30 | Disposition: A | Discharge status: Home / Self Care | Payer: 59 | Source: Ambulatory Visit | Attending: Interventional Cardiology | Admitting: Interventional Cardiology

## 2014-12-30 DIAGNOSIS — Z48812 Encounter for surgical aftercare following surgery on the circulatory system: Secondary | ICD-10-CM | POA: Diagnosis not present

## 2015-01-01 ENCOUNTER — Encounter (HOSPITAL_COMMUNITY): Payer: 59

## 2015-01-01 ENCOUNTER — Encounter (HOSPITAL_COMMUNITY)
Admission: RE | Admit: 2015-01-01 | Discharge: 2015-01-01 | Disposition: A | Discharge status: Home / Self Care | Payer: 59 | Source: Ambulatory Visit | Attending: Interventional Cardiology | Admitting: Interventional Cardiology

## 2015-01-01 DIAGNOSIS — Z48812 Encounter for surgical aftercare following surgery on the circulatory system: Secondary | ICD-10-CM | POA: Diagnosis not present

## 2015-01-01 NOTE — Progress Notes (Signed)
Reviewed home exercise with pt today.  Pt plans to walk for exercise 1 hour/day. Reviewed THR, pulse, RPE, sign and symptoms, NTG use, and when to call 911 or MD.  Pt voiced understanding.   Daniel [name] [employer]

## 2015-01-03 ENCOUNTER — Encounter (HOSPITAL_COMMUNITY)
Admission: RE | Admit: 2015-01-03 | Discharge: 2015-01-03 | Disposition: A | Discharge status: Home / Self Care | Payer: 59 | Source: Ambulatory Visit | Attending: Interventional Cardiology | Admitting: Interventional Cardiology

## 2015-01-03 ENCOUNTER — Encounter (HOSPITAL_COMMUNITY): Payer: 59

## 2015-01-03 DIAGNOSIS — Z48812 Encounter for surgical aftercare following surgery on the circulatory system: Secondary | ICD-10-CM | POA: Diagnosis not present

## 2015-01-06 ENCOUNTER — Encounter (HOSPITAL_COMMUNITY): Payer: 59

## 2015-01-07 NOTE — Progress Notes (Signed)
Loop recorder 

## 2015-01-08 ENCOUNTER — Encounter (HOSPITAL_COMMUNITY): Payer: 59

## 2015-01-10 ENCOUNTER — Encounter (HOSPITAL_COMMUNITY): Payer: 59

## 2015-01-14 ENCOUNTER — Telehealth (HOSPITAL_COMMUNITY): Payer: Self-pay | Admitting: Cardiac Rehabilitation

## 2015-01-14 NOTE — Telephone Encounter (Signed)
Phone messages received pt has been absent from cardiac rehab due to ankle sprain. pc to pt to assess.  Pt reports ankle pain is relieved however now he is c/o great toe pain. Pt advised to contact PCP if toe pain persists or worsens especially if associated with redness or swelling. Understanding verbalized.

## 2015-01-15 ENCOUNTER — Encounter (HOSPITAL_COMMUNITY): Admission: RE | Admit: 2015-01-15 | Payer: 59 | Source: Ambulatory Visit

## 2015-01-15 ENCOUNTER — Encounter (HOSPITAL_COMMUNITY): Payer: 59

## 2015-01-17 ENCOUNTER — Encounter (HOSPITAL_COMMUNITY): Payer: 59

## 2015-01-20 ENCOUNTER — Encounter (HOSPITAL_COMMUNITY)
Admission: RE | Admit: 2015-01-20 | Discharge: 2015-01-20 | Disposition: A | Discharge status: Home / Self Care | Payer: 59 | Source: Ambulatory Visit | Attending: Interventional Cardiology | Admitting: Interventional Cardiology

## 2015-01-20 ENCOUNTER — Encounter (HOSPITAL_COMMUNITY): Payer: 59

## 2015-01-20 DIAGNOSIS — Z48812 Encounter for surgical aftercare following surgery on the circulatory system: Secondary | ICD-10-CM | POA: Diagnosis present

## 2015-01-20 DIAGNOSIS — Z955 Presence of coronary angioplasty implant and graft: Secondary | ICD-10-CM | POA: Diagnosis not present

## 2015-01-22 ENCOUNTER — Encounter (HOSPITAL_COMMUNITY): Payer: 59

## 2015-01-22 ENCOUNTER — Encounter (HOSPITAL_COMMUNITY)
Admission: RE | Admit: 2015-01-22 | Discharge: 2015-01-22 | Disposition: A | Discharge status: Home / Self Care | Payer: 59 | Source: Ambulatory Visit | Attending: Interventional Cardiology | Admitting: Interventional Cardiology

## 2015-01-22 ENCOUNTER — Telehealth (HOSPITAL_COMMUNITY): Payer: Self-pay | Admitting: Cardiac Rehabilitation

## 2015-01-22 DIAGNOSIS — Z48812 Encounter for surgical aftercare following surgery on the circulatory system: Secondary | ICD-10-CM | POA: Diagnosis not present

## 2015-01-22 NOTE — Telephone Encounter (Signed)
-----   Message from Jettie Booze, MD sent at 01/06/2015 10:20 AM EDT ----- Regarding: RE: cardiac rehab If he has persistent symptoms, I agree that  Along acting nitrate would be better.  I saw him in the office and he expressed that the sx were not too frequent at this time.  He should let us know if they are more frequent.  THanks.  JV ----- Message -----    From: Lowell Guitar, RN    Sent: 01/03/2015   2:39 PM      To: Jettie Booze, MD Subject: cardiac rehab                                  Dear Dr. Irish Lack,  [place] reports he has been using NTG SL pre home exercise to prevent chest pain on exertion.  Pt also reports he has been walking 1.5 hours twice daily and experiences chest tightness about 1 hour into walk. Pt reports he has been using NTG SL almost daily in this manner. He does not use NTG before or after cardiac rehab activities.   I advised pt to not take NTG before exercise this weekend and report if he has activity limiting chest pain.  Do you feel it is appropriate for him to use NTG this way or be more effective to take long acting nitrate?  Pt states he is now agreeable to add long nitrate if  necessary.    Thank you, Andi Hence, RN, BSN Cardiac Pulmonary Rehab

## 2015-01-24 ENCOUNTER — Encounter (HOSPITAL_COMMUNITY): Payer: 59

## 2015-01-24 ENCOUNTER — Telehealth (HOSPITAL_COMMUNITY): Payer: Self-pay | Admitting: Cardiac Rehabilitation

## 2015-01-24 ENCOUNTER — Encounter (HOSPITAL_COMMUNITY)
Admission: RE | Admit: 2015-01-24 | Discharge: 2015-01-24 | Disposition: A | Discharge status: Home / Self Care | Payer: 59 | Source: Ambulatory Visit | Attending: Interventional Cardiology | Admitting: Interventional Cardiology

## 2015-01-24 DIAGNOSIS — Z48812 Encounter for surgical aftercare following surgery on the circulatory system: Secondary | ICD-10-CM | POA: Diagnosis not present

## 2015-01-24 NOTE — Telephone Encounter (Signed)
-----   Message from Jettie Booze, MD sent at 01/24/2015  8:13 AM EDT ----- Regarding: RE: cardiac rehab THanks.   ----- Message -----    From: Lowell Guitar, RN    Sent: 01/22/2015   2:38 PM      To: Jettie Booze, MD Subject: cardiac rehab                                  Dear Dr. Irish Lack,  I just wanted to update you on Daniel [name]'s chest discomfort.  Unfortunately he has had great toe and ankle pain, therefore he has not been able to do his usual activity including walking 90 minutes at home.  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  CP was relieved immediately with 1 NTG.  Pt did have mild chest tightness today at cardiac rehab while riding the stationary bike which resolved immediately with rest.  He did not have symptoms with walking or Nustep.  The bicycle was a heavier workload for him.  He is pleased with his current regimen at this time.    Thank you, Andi Hence, RN, BSN Cardiac Pulmonary Rehab

## 2015-01-24 NOTE — Progress Notes (Signed)
Pt arrived at cardiac rehab reporting increase in his citalopram to 20mg  daily per his PCP.  Medication list reconciled.

## 2015-01-27 ENCOUNTER — Encounter (HOSPITAL_COMMUNITY): Payer: 59

## 2015-01-27 ENCOUNTER — Encounter (HOSPITAL_COMMUNITY): Admission: RE | Admit: 2015-01-27 | Payer: 59 | Source: Ambulatory Visit

## 2015-01-27 ENCOUNTER — Ambulatory Visit (INDEPENDENT_AMBULATORY_CARE_PROVIDER_SITE_OTHER): Payer: 59 | Admitting: *Deleted

## 2015-01-27 DIAGNOSIS — I639 Cerebral infarction, unspecified: Secondary | ICD-10-CM | POA: Diagnosis not present

## 2015-01-29 ENCOUNTER — Encounter (HOSPITAL_COMMUNITY): Payer: 59

## 2015-01-29 ENCOUNTER — Telehealth (HOSPITAL_COMMUNITY): Payer: Self-pay | Admitting: Cardiac Rehabilitation

## 2015-01-29 NOTE — Telephone Encounter (Signed)
Pt has been absent from cardiac rehab due to work schedule.

## 2015-01-29 NOTE — Progress Notes (Signed)
Loop recorder 

## 2015-01-31 ENCOUNTER — Encounter (HOSPITAL_COMMUNITY): Payer: 59

## 2015-02-01 LAB — CUP PACEART REMOTE DEVICE CHECK: MDC IDC SESS DTM: 20160919223618

## 2015-02-01 NOTE — Progress Notes (Signed)
Carelink summary report received. Battery status OK. Normal device function. No new symptom episodes, tachy episodes, brady, or pause episodes. No new AF episodes. Monthly summary reports and ROV with JA PRN. 

## 2015-02-03 ENCOUNTER — Encounter (HOSPITAL_COMMUNITY): Payer: 59

## 2015-02-05 ENCOUNTER — Encounter (HOSPITAL_COMMUNITY): Payer: 59

## 2015-02-06 ENCOUNTER — Encounter: Payer: Self-pay | Admitting: Cardiology

## 2015-02-07 ENCOUNTER — Encounter (HOSPITAL_COMMUNITY): Payer: 59

## 2015-02-10 ENCOUNTER — Encounter: Payer: Self-pay | Admitting: Internal Medicine

## 2015-02-10 ENCOUNTER — Encounter (HOSPITAL_COMMUNITY): Payer: 59 | Attending: Interventional Cardiology

## 2015-02-10 ENCOUNTER — Encounter (HOSPITAL_COMMUNITY): Payer: 59

## 2015-02-10 DIAGNOSIS — Z955 Presence of coronary angioplasty implant and graft: Secondary | ICD-10-CM | POA: Insufficient documentation

## 2015-02-10 DIAGNOSIS — Z48812 Encounter for surgical aftercare following surgery on the circulatory system: Secondary | ICD-10-CM | POA: Insufficient documentation

## 2015-02-12 ENCOUNTER — Encounter (HOSPITAL_COMMUNITY): Payer: 59

## 2015-02-13 ENCOUNTER — Encounter: Payer: Self-pay | Admitting: Internal Medicine

## 2015-02-13 ENCOUNTER — Encounter: Payer: Self-pay | Admitting: Cardiology

## 2015-02-14 ENCOUNTER — Encounter (HOSPITAL_COMMUNITY): Admission: RE | Admit: 2015-02-14 | Payer: 59 | Source: Ambulatory Visit

## 2015-02-14 ENCOUNTER — Telehealth (HOSPITAL_COMMUNITY): Payer: Self-pay | Admitting: Cardiac Rehabilitation

## 2015-02-14 ENCOUNTER — Encounter (HOSPITAL_COMMUNITY): Payer: 59

## 2015-02-14 NOTE — Telephone Encounter (Signed)
pc to pt to assess reason for continued absence from cardiac rehab.  Pt states he has been absent this week due to work obligations.  Pt plans to return to rehab next week.

## 2015-02-17 ENCOUNTER — Encounter (HOSPITAL_COMMUNITY): Payer: 59

## 2015-02-19 ENCOUNTER — Encounter (HOSPITAL_COMMUNITY): Payer: 59

## 2015-02-19 ENCOUNTER — Encounter (HOSPITAL_COMMUNITY): Admission: RE | Admit: 2015-02-19 | Payer: 59 | Source: Ambulatory Visit

## 2015-02-21 ENCOUNTER — Encounter (HOSPITAL_COMMUNITY): Payer: 59

## 2015-02-21 ENCOUNTER — Telehealth: Payer: Self-pay | Admitting: Interventional Cardiology

## 2015-02-21 NOTE — Telephone Encounter (Signed)
LMTCB/SSS 

## 2015-02-21 NOTE — Telephone Encounter (Signed)
New message  Pt called states that he received a letter. Requests a call back to determine why he received it. Please advise

## 2015-02-24 ENCOUNTER — Encounter (HOSPITAL_COMMUNITY): Payer: 59

## 2015-02-24 NOTE — Telephone Encounter (Signed)
Informed pt that his home monitor is current and that he doesn't need to do anything further.

## 2015-02-26 ENCOUNTER — Encounter (HOSPITAL_COMMUNITY): Payer: 59

## 2015-02-26 ENCOUNTER — Ambulatory Visit (INDEPENDENT_AMBULATORY_CARE_PROVIDER_SITE_OTHER): Payer: 59 | Admitting: *Deleted

## 2015-02-26 DIAGNOSIS — I639 Cerebral infarction, unspecified: Secondary | ICD-10-CM | POA: Diagnosis not present

## 2015-02-27 NOTE — Progress Notes (Signed)
LOOP RECORDER  

## 2015-02-28 ENCOUNTER — Encounter (HOSPITAL_COMMUNITY): Payer: 59

## 2015-03-03 ENCOUNTER — Encounter (HOSPITAL_COMMUNITY): Payer: 59

## 2015-03-05 ENCOUNTER — Encounter (HOSPITAL_COMMUNITY): Payer: 59

## 2015-03-07 ENCOUNTER — Encounter (HOSPITAL_COMMUNITY): Payer: 59

## 2015-03-10 ENCOUNTER — Encounter (HOSPITAL_COMMUNITY): Payer: 59

## 2015-03-12 ENCOUNTER — Encounter (HOSPITAL_COMMUNITY): Payer: 59

## 2015-03-14 ENCOUNTER — Encounter (HOSPITAL_COMMUNITY): Payer: 59

## 2015-03-17 ENCOUNTER — Encounter (HOSPITAL_COMMUNITY): Payer: 59

## 2015-03-18 LAB — CUP PACEART REMOTE DEVICE CHECK: Date Time Interrogation Session: 20161019223540

## 2015-03-18 NOTE — Progress Notes (Signed)
Carelink summary report received. Battery status OK. Normal device function. No new symptom episodes, tachy episodes, brady, or pause episodes. No new AF episodes. Monthly summary reports and ROV with JA PRN. 

## 2015-03-19 ENCOUNTER — Encounter (HOSPITAL_COMMUNITY): Payer: 59

## 2015-03-21 ENCOUNTER — Encounter (HOSPITAL_COMMUNITY): Payer: 59

## 2015-03-24 ENCOUNTER — Encounter (HOSPITAL_COMMUNITY): Payer: 59

## 2015-03-26 ENCOUNTER — Encounter (HOSPITAL_COMMUNITY): Payer: 59

## 2015-03-28 ENCOUNTER — Encounter (HOSPITAL_COMMUNITY): Payer: 59

## 2015-03-28 ENCOUNTER — Ambulatory Visit (INDEPENDENT_AMBULATORY_CARE_PROVIDER_SITE_OTHER): Payer: 59 | Admitting: *Deleted

## 2015-03-28 DIAGNOSIS — I639 Cerebral infarction, unspecified: Secondary | ICD-10-CM | POA: Diagnosis not present

## 2015-03-31 ENCOUNTER — Encounter (HOSPITAL_COMMUNITY): Payer: 59

## 2015-03-31 NOTE — Progress Notes (Signed)
LOOP RECORDER  

## 2015-04-01 ENCOUNTER — Encounter (HOSPITAL_COMMUNITY): Payer: Self-pay | Admitting: Cardiac Rehabilitation

## 2015-04-01 ENCOUNTER — Telehealth (HOSPITAL_COMMUNITY): Payer: Self-pay | Admitting: Cardiac Rehabilitation

## 2015-04-01 NOTE — Telephone Encounter (Signed)
Pt discharged from cardiacrehab program due to nonattendance. Letter mailed to pt home address.  Dr. Irish Lack made aware.

## 2015-04-02 ENCOUNTER — Encounter (HOSPITAL_COMMUNITY): Payer: 59

## 2015-04-07 ENCOUNTER — Encounter (HOSPITAL_COMMUNITY): Payer: 59

## 2015-04-09 ENCOUNTER — Encounter (HOSPITAL_COMMUNITY): Payer: 59

## 2015-04-11 ENCOUNTER — Encounter (HOSPITAL_COMMUNITY): Payer: 59

## 2015-04-14 ENCOUNTER — Encounter (HOSPITAL_COMMUNITY): Payer: 59

## 2015-04-16 ENCOUNTER — Encounter (HOSPITAL_COMMUNITY): Payer: 59

## 2015-04-17 ENCOUNTER — Ambulatory Visit: Payer: 59 | Admitting: Neurology

## 2015-04-28 LAB — CUP PACEART REMOTE DEVICE CHECK: Date Time Interrogation Session: 20161118230536

## 2015-04-29 ENCOUNTER — Ambulatory Visit (INDEPENDENT_AMBULATORY_CARE_PROVIDER_SITE_OTHER): Payer: 59 | Admitting: *Deleted

## 2015-04-29 DIAGNOSIS — I639 Cerebral infarction, unspecified: Secondary | ICD-10-CM | POA: Diagnosis not present

## 2015-04-29 NOTE — Progress Notes (Signed)
Carelink Summary Report / Loop Recorder 

## 2015-05-13 ENCOUNTER — Ambulatory Visit (INDEPENDENT_AMBULATORY_CARE_PROVIDER_SITE_OTHER): Payer: 59 | Admitting: Neurology

## 2015-05-13 ENCOUNTER — Telehealth: Payer: Self-pay | Admitting: Neurology

## 2015-05-13 ENCOUNTER — Encounter: Payer: Self-pay | Admitting: Neurology

## 2015-05-13 VITALS — BP 125/81 | HR 88 | Ht 71.0 in | Wt 196.8 lb

## 2015-05-13 DIAGNOSIS — I6990 Unspecified sequelae of unspecified cerebrovascular disease: Secondary | ICD-10-CM | POA: Diagnosis not present

## 2015-05-13 NOTE — Telephone Encounter (Signed)
Patient called 3:31pm to advise he is running late for 3:30pm appointment today, at the time he called he was passing [employer].

## 2015-05-13 NOTE — Progress Notes (Signed)
[facility] [address] [place]. [place] [address] [phone]       OFFICE FOLLOW-UP NOTE  Daniel [name] Date of Birth:  [birth year] Medical Record Number:  [id number]   HPI: 4 year Caucasian male seen today for first office follow-up visit following hospital admission for stroke on 07/26/14. He presented with sudden onset of left-sided weakness and clumsiness and presented beyond time window for TPA. Initial CT scan that was unremarkable but subsequently MRI scan showed patchy right frontotemporal small MCA branch infarct and MRA showed right M3 branch occlusion. Urine drug screen was negative. Total cholesterol was 185, triglycerides 119, HDL 32 and LDL was elevated at 129 mg percent. He will A1c was 5.3. Transthoracic and transesophageal echocardiogram were both done and did not show any cardiac source of embolism. Patient subsequently had outpatient loop recorder inserted few weeks later. He was rehospitalized with chest pain and discomfort and found to have significant coronary artery disease and underwent cardiac stent by Dr. Irish Lack in May 2016. He is currently on aspirin and Plavix and tolerating it well without significant bleeding and only minor bruising. He feels his left-sided strength has recovered almost completely. This has only minor clumsiness in his left hand. He has finished outpatient physical and occupational therapy and has fully regained all his activities of daily living. He states his blood pressure is usually well controlled though today is elevated in office. Update 05/13/2015 : He returns for follow-up after last visit 6 months ago. He continues to do well without recurrent stroke or TIA symptoms. He unfortunately fell off a ladder on December 1 and had a collarbone fracture but fortunately did not need any surgery. He however is having significant pain in his left shoulder and feels his left hand strength and mobility is restricted due to that. He  remains on aspirin and Plavix which is tolerating well with only minor bruising but no bleeding episodes. He plans to start therapy for his left arm and shoulder soon. He states his blood pressure is well controlled and today it is 125/87. His tolerating Lipitor well without any side effects. He has not Had atrial fibrillation detected on his loop recorder. He has no neurological complaints today. ROS:   14 system review of systems is positive for no complaints today and all other systems negative PMH:  Past Medical History  Diagnosis Date  . Essential hypertension   . HLD (hyperlipidemia)   . Hx of echocardiogram     a.  Echo 3/16:  mild LVH, EF 40-45%, Gr 1 DD, mild LAE;  b.  TEE 3/16:  EF 55%, mild LAE, no LAA clot, neg bubble study  . Carotid stenosis     a. Neck CTA 3/16:  mild bilat ICA calcific plaque (nothing > 50%)  . CVA (cerebral vascular accident) ([place]) 07/2014    "left hand and part of that arm weak since" (09/24/2014)  . Depression     "since stroke in 07/2014"   . Basal cell carcinoma of nose ~ 2012  . Headache     Social History:  Social History   Social History  . Marital Status: Married    Spouse Name: N/A  . Number of Children: 1  . Years of Education: 14   Occupational History  . own transportation company    Social History Main Topics  . Smoking status: Current Every Day Smoker -- 0.50 packs/day for 46 years    Types: Cigarettes  . Smokeless tobacco: Never  Used     Comment: 10/17/14 cutting back  . Alcohol Use: 1.2 oz/week    1 Glasses of wine, 1 Shots of liquor per week     Comment: 09/24/2014 "glass of wine or a beer q couple weeks"  . Drug Use: No  . Sexual Activity: Not Currently   Other Topics Concern  . Not on file   Social History Narrative   Married   Right handed   Caffeine use- 1 cup coffee daily    Medications:   Current Outpatient Prescriptions on File Prior to Visit  Medication Sig Dispense Refill  . acetaminophen (TYLENOL) 325 MG  tablet Take 2 tablets (650 mg total) by mouth every 4 (four) hours as needed for headache or mild pain.    Marland Kitchen aspirin 81 MG chewable tablet Chew 1 tablet (81 mg total) by mouth daily.    Marland Kitchen atorvastatin (LIPITOR) 40 MG tablet Take 1 tablet (40 mg total) by mouth daily at 6 PM. 30 tablet 11  . clopidogrel (PLAVIX) 75 MG tablet Take 1 tablet (75 mg total) by mouth daily with breakfast. 90 tablet 11  . lisinopril (PRINIVIL,ZESTRIL) 40 MG tablet Take 1 tablet (40 mg total) by mouth daily. 30 tablet 11  . metoprolol succinate (TOPROL-XL) 50 MG 24 hr tablet Take 1 tablet (50 mg total) by mouth daily. 30 tablet 11  . Multiple Vitamin (MULTIVITAMIN) tablet Take 1 tablet by mouth daily.    . nitroGLYCERIN (NITROSTAT) 0.4 MG SL tablet Place 0.4 mg under the tongue every 5 (five) minutes as needed for chest pain.     No current facility-administered medications on file prior to visit.    Allergies:   Allergies  Allergen Reactions  . Shrimp [Shellfish Allergy] Other (See Comments)    "heart attack symptoms", "chest pain, numbness in arms" about 20 years ago    Physical Exam General: well developed, well nourished middle-aged Caucasian male, seated, in no evident distress Head: head normocephalic and atraumatic.  Neck: supple with no carotid or supraclavicular bruits Cardiovascular: regular rate and rhythm, no murmurs Musculoskeletal: no deformity but left shoulder tenderness and movements restricted due to pain Skin:  no rash/petichiae Vascular:  Normal pulses all extremities Filed Vitals:   05/13/15 1547  BP: 125/81  Pulse: 88   Neurologic Exam Mental Status: Awake and fully alert. Oriented to place and time. Recent and remote memory intact. Attention span, concentration and fund of knowledge appropriate. Mood and affect appropriate.  Cranial Nerves: Fundoscopic exam reveals sharp disc margins. Pupils equal, briskly reactive to light. Extraocular movements full without nystagmus. Visual fields  full to confrontation. Hearing intact. Facial sensation intact. Face, tongue, palate moves normally and symmetrically.  Motor: Normal bulk and tone. Normal strength in all tested extremity muscles. Diminished fine finger movements in the left hand. Orbits right over left upper extremity. Minimum weakness of left grip. Sensory.: intact to touch ,pinprick .position and vibratory sensation.  Coordination: Rapid alternating movements normal in all extremities. Finger-to-nose and heel-to-shin performed accurately bilaterally. Gait and Station: Arises from chair without difficulty. Stance is normal. Gait demonstrates normal stride length and balance . Able to heel, toe and tandem walk without difficulty.  Reflexes: 1+ and symmetric. Toes downgoing.     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 Significant vascular risk factors of hypertension, hyperlipidemia, coronary artery disease status post recent cardiac stent    PLAN: I had  a long d/w patient and family about his recent stroke, risk for recurrent stroke/TIAs, personally independently reviewed imaging studies and stroke evaluation results and answered questions.Continue aspirin 81 mg daily and clopidogrel 75 mg daily  due to recent cardiac stent and for secondary stroke prevention and maintain strict control of hypertension with blood pressure goal below 130/90, diabetes with hemoglobin A1c goal below 6.5% and lipids with LDL cholesterol goal below 70 mg/dL. I also advised the patient to eat a healthy diet with plenty of whole grains, cereals, fruits and vegetables, exercise regularly and maintain ideal body weight Followup in the future with me in  1 year or call earlier if necessary. Antony Contras, MD  Note: This document was prepared with digital dictation and possible smart phrase technology. Any transcriptional errors that result  from this process are unintentional

## 2015-05-13 NOTE — Telephone Encounter (Signed)
Patient is in office for follow up. Pt already check in.

## 2015-05-13 NOTE — Patient Instructions (Addendum)
I had a long d/w patient and family about his recent stroke, risk for recurrent stroke/TIAs, personally independently reviewed imaging studies and stroke evaluation results and answered questions.Continue aspirin 81 mg daily and clopidogrel 75 mg daily  due to recent cardiac stent and for secondary stroke prevention and maintain strict control of hypertension with blood pressure goal below 130/90, diabetes with hemoglobin A1c goal below 6.5% and lipids with LDL cholesterol goal below 70 mg/dL. I also advised the patient to eat a healthy diet with plenty of whole grains, cereals, fruits and vegetables, exercise regularly and maintain ideal body weight Followup in the future with me in  1 year or call earlier if necessary.

## 2015-05-27 ENCOUNTER — Ambulatory Visit (INDEPENDENT_AMBULATORY_CARE_PROVIDER_SITE_OTHER): Payer: 59 | Admitting: *Deleted

## 2015-05-27 DIAGNOSIS — I639 Cerebral infarction, unspecified: Secondary | ICD-10-CM | POA: Diagnosis not present

## 2015-05-28 NOTE — Progress Notes (Signed)
Carelink Summary Report / Loop Recorder 

## 2015-06-14 LAB — CUP PACEART REMOTE DEVICE CHECK: Date Time Interrogation Session: 20161218230607

## 2015-06-26 ENCOUNTER — Ambulatory Visit (INDEPENDENT_AMBULATORY_CARE_PROVIDER_SITE_OTHER): Payer: 59 | Admitting: *Deleted

## 2015-06-26 DIAGNOSIS — I639 Cerebral infarction, unspecified: Secondary | ICD-10-CM | POA: Diagnosis not present

## 2015-06-26 LAB — CUP PACEART REMOTE DEVICE CHECK: Date Time Interrogation Session: 20170216233714

## 2015-06-27 NOTE — Progress Notes (Signed)
Carelink Summary Report / Loop Recorder 

## 2015-07-08 ENCOUNTER — Telehealth: Payer: Self-pay | Admitting: *Deleted

## 2015-07-08 NOTE — Telephone Encounter (Signed)
Called patient to make him aware that he does not need to send manual transmissions from his home monitor daily.  Advised patient that his home monitor will transmit automatically if it is set up next to where he sleeps at night.  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  Advised patient that we can reprogram the transmission time so that it transmits during the day when he is working in his home office.  Patient is agreeable to device clinic appointment to reschedule transmission time to 9:00am.  He requests that I call him back later today to schedule this appointment.  Patient denies additional questions or concerns at this time.

## 2015-07-11 NOTE — Telephone Encounter (Signed)
Called patient to schedule device clinic appointment.  He is agreeable to a device clinic appointment (to reprogram transmission time to 9:00am) on 07/31/15 at 9:00am.  Patient is appreciative of call and denies questions or concerns at this time.

## 2015-07-14 LAB — CUP PACEART REMOTE DEVICE CHECK: Date Time Interrogation Session: 20170117230744

## 2015-07-14 NOTE — Progress Notes (Signed)
Carelink summary report received. Battery status OK. Normal device function. No new symptom episodes, tachy episodes, brady, or pause episodes. No new AF episodes. Monthly summary reports and ROV/PRN 

## 2015-07-16 NOTE — Progress Notes (Signed)
Carelink summary report received. Battery status OK. Normal device function. No new symptom episodes, tachy episodes, brady, or pause episodes. No new AF episodes. Monthly summary reports and ROV/PRN 

## 2015-07-28 ENCOUNTER — Ambulatory Visit (INDEPENDENT_AMBULATORY_CARE_PROVIDER_SITE_OTHER): Payer: 59 | Admitting: *Deleted

## 2015-07-28 DIAGNOSIS — I639 Cerebral infarction, unspecified: Secondary | ICD-10-CM | POA: Diagnosis not present

## 2015-07-29 NOTE — Progress Notes (Signed)
Carelink Summary Report / Loop Recorder 

## 2015-08-25 ENCOUNTER — Ambulatory Visit (INDEPENDENT_AMBULATORY_CARE_PROVIDER_SITE_OTHER): Payer: 59 | Admitting: *Deleted

## 2015-08-25 DIAGNOSIS — I639 Cerebral infarction, unspecified: Secondary | ICD-10-CM | POA: Diagnosis not present

## 2015-08-26 NOTE — Progress Notes (Signed)
Carelink Summary Report / Loop Recorder 

## 2015-09-24 ENCOUNTER — Ambulatory Visit (INDEPENDENT_AMBULATORY_CARE_PROVIDER_SITE_OTHER): Payer: 59 | Admitting: *Deleted

## 2015-09-24 DIAGNOSIS — I639 Cerebral infarction, unspecified: Secondary | ICD-10-CM | POA: Diagnosis not present

## 2015-09-26 NOTE — Progress Notes (Signed)
Carelink Summary Report / Loop Recorder 

## 2015-10-03 LAB — CUP PACEART REMOTE DEVICE CHECK: Date Time Interrogation Session: 20170318233632

## 2015-10-06 ENCOUNTER — Other Ambulatory Visit: Payer: Self-pay | Admitting: Cardiology

## 2015-10-06 LAB — CUP PACEART REMOTE DEVICE CHECK: Date Time Interrogation Session: 20170418000953

## 2015-10-06 NOTE — Progress Notes (Signed)
Carelink summary report received. Battery status OK. Normal device function. No new symptom episodes, tachy episodes, brady, or pause episodes. No new AF episodes. Monthly summary reports and ROV/PRN 

## 2015-10-24 ENCOUNTER — Ambulatory Visit (INDEPENDENT_AMBULATORY_CARE_PROVIDER_SITE_OTHER): Payer: 59 | Admitting: *Deleted

## 2015-10-24 DIAGNOSIS — I639 Cerebral infarction, unspecified: Secondary | ICD-10-CM

## 2015-10-27 NOTE — Progress Notes (Signed)
Carelink Summary Report / Loop Recorder 

## 2015-11-03 LAB — CUP PACEART REMOTE DEVICE CHECK
Date Time Interrogation Session: 20170617003938
MDC IDC SESS DTM: 20170518003932

## 2015-11-09 ENCOUNTER — Other Ambulatory Visit: Payer: Self-pay | Admitting: Physician Assistant

## 2015-11-10 ENCOUNTER — Other Ambulatory Visit: Payer: Self-pay | Admitting: *Deleted

## 2015-11-10 DIAGNOSIS — E785 Hyperlipidemia, unspecified: Secondary | ICD-10-CM

## 2015-11-10 DIAGNOSIS — I10 Essential (primary) hypertension: Secondary | ICD-10-CM

## 2015-11-10 MED ORDER — ATORVASTATIN CALCIUM 40 MG PO TABS: 40.0000 mg | ORAL_TABLET | Freq: Every day | ORAL | Status: DC

## 2015-11-10 MED ORDER — METOPROLOL SUCCINATE ER 50 MG PO TB24: 50.0000 mg | ORAL_TABLET | Freq: Every day | ORAL | Status: DC

## 2015-11-24 ENCOUNTER — Ambulatory Visit (INDEPENDENT_AMBULATORY_CARE_PROVIDER_SITE_OTHER): Payer: 59 | Admitting: *Deleted

## 2015-11-24 DIAGNOSIS — I639 Cerebral infarction, unspecified: Secondary | ICD-10-CM | POA: Diagnosis not present

## 2015-11-24 NOTE — Progress Notes (Signed)
Carelink Summary Report / Loop Recorder 

## 2015-12-09 ENCOUNTER — Telehealth: Payer: Self-pay | Admitting: Interventional Cardiology

## 2015-12-09 NOTE — Telephone Encounter (Signed)
**Note De-Identified  Obfuscation** The pt takes Asa 81 mg daily and Plavix 75 mg daily. Please advise.

## 2015-12-09 NOTE — Telephone Encounter (Signed)
New Message  Request for surgical clearance:  1. What type of surgery is being performed? colonoscopy   2. When is this surgery scheduled?  12/12/15  3. Are there any medications that need to be held prior to surgery and how long? Calling to see what medications.   4. Name of physician performing surgery? Dr. Alice Reichert  5. What is your office phone and fax number?  [phone]

## 2015-12-09 NOTE — Telephone Encounter (Signed)
Fax sent to Dr. Ricky Stabs office

## 2015-12-09 NOTE — Telephone Encounter (Signed)
It is typically up to the GI doctor as to what has to be stopped before the procedure. It is okay to hold his Plavix for 2 days prior to the colonoscopy since his stents are more than a year old.

## 2015-12-16 LAB — CUP PACEART REMOTE DEVICE CHECK: MDC IDC SESS DTM: 20170717013827

## 2015-12-23 ENCOUNTER — Ambulatory Visit (INDEPENDENT_AMBULATORY_CARE_PROVIDER_SITE_OTHER): Payer: 59 | Admitting: *Deleted

## 2015-12-23 DIAGNOSIS — I639 Cerebral infarction, unspecified: Secondary | ICD-10-CM | POA: Diagnosis not present

## 2015-12-24 NOTE — Progress Notes (Signed)
Carelink Summary Report / Loop Recorder 

## 2016-01-07 ENCOUNTER — Encounter: Payer: Self-pay | Admitting: *Deleted

## 2016-01-09 ENCOUNTER — Telehealth: Payer: Self-pay | Admitting: Acute Care

## 2016-01-13 NOTE — Telephone Encounter (Signed)
Will forward message to [facility] to follow up on.

## 2016-01-15 ENCOUNTER — Telehealth: Payer: Self-pay | Admitting: Cardiology

## 2016-01-15 NOTE — Telephone Encounter (Signed)
LMOVM requesting that pt send manual transmission b/c home monitor has not updated in at least 14 days.    

## 2016-01-19 LAB — CUP PACEART REMOTE DEVICE CHECK: Date Time Interrogation Session: 20170816013932

## 2016-01-19 NOTE — Progress Notes (Signed)
Carelink summary report received. Battery status OK. Normal device function. Fair histogram. No new symptom episodes, tachy episodes, brady, or pause episodes. No new AF episodes. Monthly summary reports and ROV/PRN

## 2016-01-21 NOTE — Telephone Encounter (Signed)
LVM for pt to return call

## 2016-01-22 ENCOUNTER — Ambulatory Visit (INDEPENDENT_AMBULATORY_CARE_PROVIDER_SITE_OTHER): Payer: 59 | Admitting: *Deleted

## 2016-01-22 DIAGNOSIS — I639 Cerebral infarction, unspecified: Secondary | ICD-10-CM | POA: Diagnosis not present

## 2016-01-23 NOTE — Progress Notes (Signed)
Carelink Summary Report / Loop Recorder 

## 2016-01-25 ENCOUNTER — Other Ambulatory Visit: Payer: Self-pay | Admitting: Cardiology

## 2016-01-27 ENCOUNTER — Other Ambulatory Visit: Payer: Self-pay | Admitting: *Deleted

## 2016-01-27 MED ORDER — CLOPIDOGREL BISULFATE 75 MG PO TABS: ORAL_TABLET | ORAL | 0 refills | Status: DC

## 2016-01-27 MED ORDER — LISINOPRIL 40 MG PO TABS: 40.0000 mg | ORAL_TABLET | Freq: Every day | ORAL | 0 refills | Status: DC

## 2016-02-08 ENCOUNTER — Other Ambulatory Visit: Payer: Self-pay | Admitting: Interventional Cardiology

## 2016-02-14 LAB — CUP PACEART REMOTE DEVICE CHECK: MDC IDC SESS DTM: 20170915020717

## 2016-02-14 NOTE — Progress Notes (Signed)
Carelink summary report received. Battery status OK. Normal device function. No new symptom episodes, tachy episodes, brady, or pause episodes. No new AF episodes. Monthly summary reports and ROV/PRN 

## 2016-02-23 ENCOUNTER — Ambulatory Visit (INDEPENDENT_AMBULATORY_CARE_PROVIDER_SITE_OTHER): Payer: 59 | Admitting: *Deleted

## 2016-02-23 DIAGNOSIS — I639 Cerebral infarction, unspecified: Secondary | ICD-10-CM | POA: Diagnosis not present

## 2016-02-23 NOTE — Progress Notes (Signed)
Carelink Summary Report / Loop Recorder 

## 2016-03-22 ENCOUNTER — Ambulatory Visit (INDEPENDENT_AMBULATORY_CARE_PROVIDER_SITE_OTHER): Payer: 59 | Admitting: *Deleted

## 2016-03-22 DIAGNOSIS — I639 Cerebral infarction, unspecified: Secondary | ICD-10-CM | POA: Diagnosis not present

## 2016-03-23 NOTE — Progress Notes (Signed)
Carelink Summary Report / Loop Recorder 

## 2016-03-25 ENCOUNTER — Other Ambulatory Visit: Payer: Self-pay | Admitting: Interventional Cardiology

## 2016-03-27 LAB — CUP PACEART REMOTE DEVICE CHECK
Implantable Pulse Generator Implant Date: 20160323
MDC IDC SESS DTM: 20171015024707

## 2016-03-27 NOTE — Progress Notes (Signed)
Carelink summary report received. Battery status OK. Normal device function. No new symptom episodes, tachy episodes, brady, or pause episodes. No new AF episodes. Monthly summary reports and ROV/PRN 

## 2016-04-08 IMAGING — CT CT ANGIO HEAD
1 of 10 series · 5 of 33 positions shown · IV contrast (OMNI 350)
Comparison: CT head 07/27/2014.  MR head 07/27/2014.

CLINICAL DATA: Sudden onset LEFT-sided weakness beginning
07/17/2014. Initial encounter.



[Series 8: axial 1x1 · axial · 0.47mm/px · z∈[-346,-105]mm · 5 of 363 slices shown]
[im 61/363  soft-tissue]
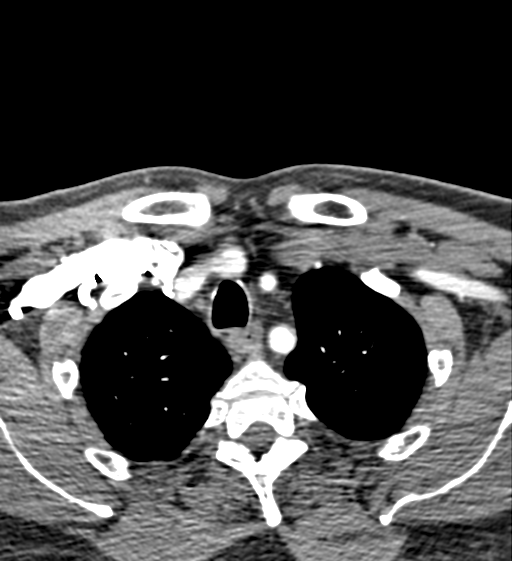
[im 121/363  bone]
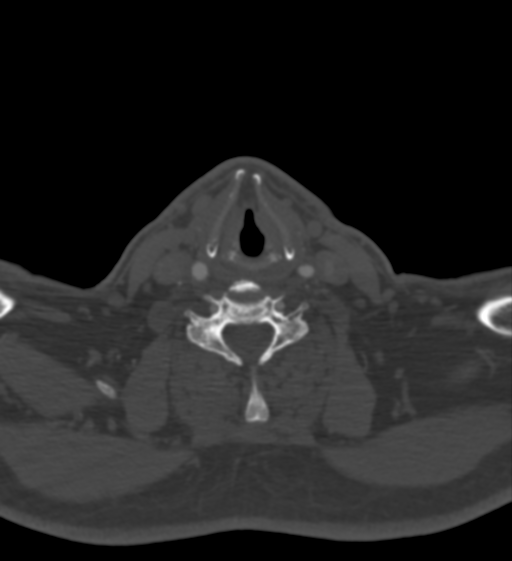
[im 182/363  soft-tissue]
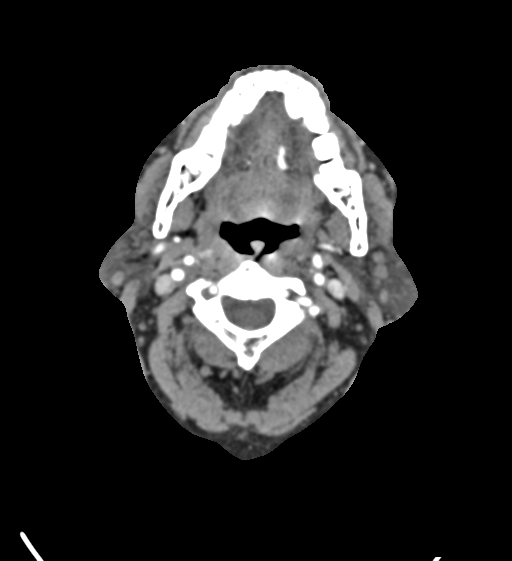
[im 242/363  bone]
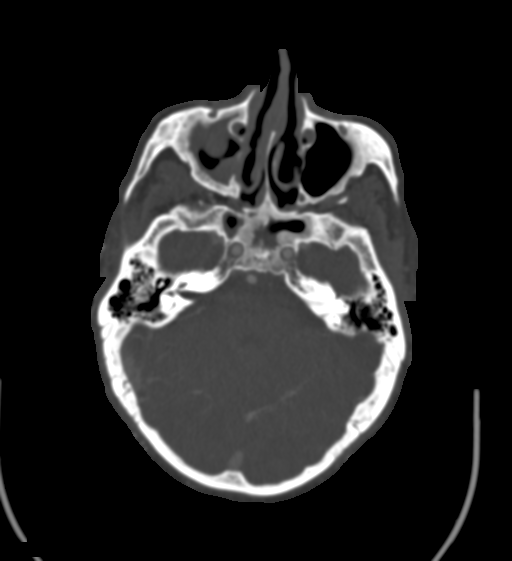
[im 302/363  soft-tissue]
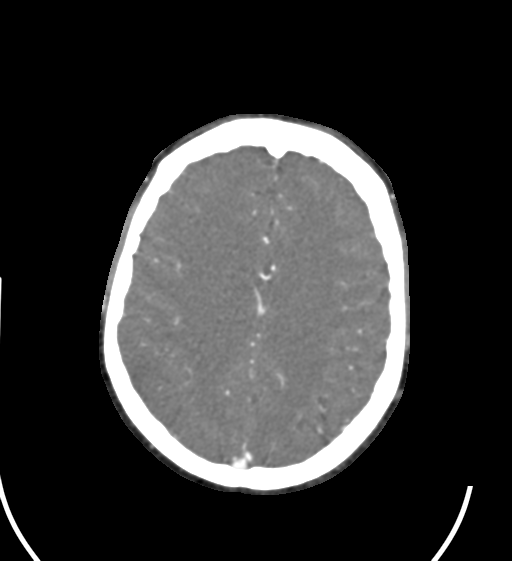

[5 of 33 positions shown; findings below may reference images not displayed]

FINDINGS: CT HEAD

Calvarium and skull base: No fracture or destructive lesion.
Mastoids and middle ears are grossly clear.

Paranasal sinuses: Imaged portions are clear, except for moderate
paranasal sinus disease RIGHT maxillary region.

Orbits: Negative.

Brain: No hemorrhage or neoplasm. Normal cerebral volume. Calcific
embolus to a posterior Debesai Ejupi3 branch with developing cytotoxic
edema throughout posterior frontal and temporal lobes as well as the
adjacent white matter.

CTA NECK

Aortic arch: Standard branching. Imaged portion shows no evidence of
aneurysm or dissection. No significant stenosis of the major arch
vessel origins. Moderate calcific and soft plaque at the innominate
origin and LEFT common carotid origin.

Right carotid system: There is extensive soft plaque and to a lesser
degree calcific plaque at the RIGHT ICA origin extending for length
of 2 cm cephalad into the proximal RIGHT internal carotid. Stenosis
measurements of 2.3 mm proximal and 5.0 mm distal yield a slightly
greater than 50% stenosis, non flow reducing, but the irregular
appearance of the soft plaque strongly suggests that this vessel
could represent the embolic source.

Left carotid system: Moderate calcific and soft plaque without flow
reducing stenosis. Luminal diameter of 2.9 mm. Proximal and 4.6 mm
distal calculates to less than 50% stenosis. No evidence of
dissection, stenosis (50% or greater) or occlusion.

Vertebral arteries: LEFT minimally larger. No evidence of
dissection, stenosis (50% or greater) or occlusion.

Nonvascular structures: No adenopathy. Airway midline. Lung apices
show no mass or pneumothorax. No worrisome osseous lesions. Cervical
spondylosis. Normal thyroid.

CTA HEAD

Anterior circulation: 1 x 4 mm calcific embolus to a RIGHT MCA M3
branch in the sylvian fissure. Distal to this, there is cytotoxic
edema with absent perfusion and diminished enhancement. No large
vessel occlusion of the proximal RIGHT or LEFT ICA or RIGHT or LEFT
MCA M1 segment. Normal anterior cerebrals. No significant stenosis,
proximal occlusion, aneurysm, or vascular malformation.

Posterior circulation: No significant stenosis, proximal occlusion,
aneurysm, or vascular malformation.

Venous sinuses: As permitted by contrast timing, patent.

Anatomic variants: None of significance.

Delayed phase: No abnormal intracranial enhancement. No luxury
perfusion has developed at this time at the site of infarction.
IMPRESSION: Re-demonstration of acute RIGHT frontotemporal infarction due to a
calcific embolus to a RIGHT MCA M3 branch.

Moderately irregular and extensive mixed soft plaque and calcific
plaque at the RIGHT internal carotid artery origin likely represents
the embolic source. Slightly greater than 50% stenosis is non flow
reducing. See discussion above.

## 2016-04-21 ENCOUNTER — Ambulatory Visit (INDEPENDENT_AMBULATORY_CARE_PROVIDER_SITE_OTHER): Payer: 59 | Admitting: *Deleted

## 2016-04-21 DIAGNOSIS — I639 Cerebral infarction, unspecified: Secondary | ICD-10-CM

## 2016-04-22 NOTE — Progress Notes (Signed)
Carelink Summary Report / Loop Recorder 

## 2016-05-05 LAB — CUP PACEART REMOTE DEVICE CHECK
Date Time Interrogation Session: 20171114034049
MDC IDC PG IMPLANT DT: 20160323

## 2016-05-05 NOTE — Telephone Encounter (Signed)
LMTCB x2  

## 2016-05-11 ENCOUNTER — Other Ambulatory Visit: Payer: Self-pay | Admitting: Interventional Cardiology

## 2016-05-12 ENCOUNTER — Telehealth: Payer: Self-pay

## 2016-05-12 ENCOUNTER — Other Ambulatory Visit: Payer: Self-pay

## 2016-05-12 ENCOUNTER — Ambulatory Visit: Payer: 59 | Admitting: Neurology

## 2016-05-12 NOTE — Telephone Encounter (Signed)
PT WAS A NO SHOW FOR APPT TODAY.

## 2016-05-13 ENCOUNTER — Encounter: Payer: Self-pay | Admitting: Neurology

## 2016-05-14 ENCOUNTER — Telehealth: Payer: Self-pay | Admitting: Cardiology

## 2016-05-14 NOTE — Telephone Encounter (Signed)
LMOVM requesting that pt send manual transmission b/c home monitor has not updated in at least 14 days.    

## 2016-05-17 NOTE — Telephone Encounter (Signed)
lmom for pt to call our office---looks like the appt with SG has been cancelled due to length of time trying to contact the pt to set up appt.  He will need to contact his PCP to have a new referral sent over to [place].  The wait for the next appt is about 8 weeks.

## 2016-05-21 ENCOUNTER — Encounter: Payer: Self-pay | Admitting: Cardiology

## 2016-05-21 ENCOUNTER — Ambulatory Visit (INDEPENDENT_AMBULATORY_CARE_PROVIDER_SITE_OTHER): Payer: 59 | Admitting: *Deleted

## 2016-05-21 DIAGNOSIS — I639 Cerebral infarction, unspecified: Secondary | ICD-10-CM | POA: Diagnosis not present

## 2016-05-24 NOTE — Progress Notes (Signed)
Carelink Summary Report / Loop Recorder 

## 2016-06-10 NOTE — Telephone Encounter (Signed)
LMOM for pt.  Lung cancer screening referral was canceled on 02/22/16 per pt request.  Pt wanted to discuss with Dr Harrington Challenger further.  WE have attempted to reach pt several times to reschedule.  We faxed a note to Dr Harrington Challenger office to let him know that a new referral will need to be placed if he would still like pt to be seen. Message will be closed at this time.

## 2016-06-12 LAB — CUP PACEART REMOTE DEVICE CHECK
MDC IDC PG IMPLANT DT: 20160323
MDC IDC SESS DTM: 20171214043821

## 2016-06-12 NOTE — Progress Notes (Signed)
Carelink summary report received. Battery status OK. Normal device function. No new symptom episodes, tachy episodes, brady, or pause episodes. No new AF episodes. Monthly summary reports and ROV/PRN 

## 2016-06-14 ENCOUNTER — Other Ambulatory Visit: Payer: Self-pay | Admitting: Interventional Cardiology

## 2016-06-21 ENCOUNTER — Ambulatory Visit (INDEPENDENT_AMBULATORY_CARE_PROVIDER_SITE_OTHER): Payer: 59 | Admitting: *Deleted

## 2016-06-21 DIAGNOSIS — I639 Cerebral infarction, unspecified: Secondary | ICD-10-CM | POA: Diagnosis not present

## 2016-06-22 NOTE — Progress Notes (Signed)
Carelink Summary Report / Loop Recorder 

## 2016-07-01 LAB — CUP PACEART REMOTE DEVICE CHECK
Implantable Pulse Generator Implant Date: 20160323
MDC IDC SESS DTM: 20180113043827

## 2016-07-13 LAB — CUP PACEART REMOTE DEVICE CHECK
Date Time Interrogation Session: 20180212044052
Implantable Pulse Generator Implant Date: 20160323

## 2016-07-13 NOTE — Progress Notes (Signed)
Carelink summary report received. Battery status OK. Normal device function. No new symptom episodes, tachy episodes, brady, or pause episodes. No new AF episodes. Monthly summary reports and ROV/PRN 

## 2016-07-15 ENCOUNTER — Telehealth: Payer: Self-pay | Admitting: Cardiology

## 2016-07-15 NOTE — Telephone Encounter (Signed)
Spoke w/ pt and requested that he send a manual transmission b/c his home monitor has not updated in at least 14 days.   

## 2016-07-21 ENCOUNTER — Ambulatory Visit (INDEPENDENT_AMBULATORY_CARE_PROVIDER_SITE_OTHER): Payer: 59 | Admitting: *Deleted

## 2016-07-21 DIAGNOSIS — I639 Cerebral infarction, unspecified: Secondary | ICD-10-CM

## 2016-07-21 NOTE — Progress Notes (Signed)
Carelink Summary Report / Loop Recorder 

## 2016-07-26 ENCOUNTER — Other Ambulatory Visit: Payer: Self-pay | Admitting: Interventional Cardiology

## 2016-07-29 LAB — CUP PACEART REMOTE DEVICE CHECK
Implantable Pulse Generator Implant Date: 20160323
MDC IDC SESS DTM: 20180314043723

## 2016-07-29 NOTE — Progress Notes (Signed)
Carelink summary report received. Battery status OK. Normal device function. No new symptom episodes, tachy episodes, brady, or pause episodes. No new AF episodes. Monthly summary reports and ROV/PRN 

## 2016-08-03 DIAGNOSIS — S2243XA Multiple fractures of ribs, bilateral, initial encounter for closed fracture: Secondary | ICD-10-CM

## 2016-08-03 DIAGNOSIS — S2241XA Multiple fractures of ribs, right side, initial encounter for closed fracture: Secondary | ICD-10-CM | POA: Diagnosis not present

## 2016-08-03 DIAGNOSIS — S0181XA Laceration without foreign body of other part of head, initial encounter: Secondary | ICD-10-CM | POA: Diagnosis not present

## 2016-08-03 DIAGNOSIS — R937 Abnormal findings on diagnostic imaging of other parts of musculoskeletal system: Secondary | ICD-10-CM | POA: Diagnosis not present

## 2016-08-03 DIAGNOSIS — S29019A Strain of muscle and tendon of unspecified wall of thorax, initial encounter: Secondary | ICD-10-CM | POA: Diagnosis not present

## 2016-08-03 DIAGNOSIS — S32422A Displaced fracture of posterior wall of left acetabulum, initial encounter for closed fracture: Secondary | ICD-10-CM | POA: Diagnosis not present

## 2016-08-03 DIAGNOSIS — S32402A Unspecified fracture of left acetabulum, initial encounter for closed fracture: Secondary | ICD-10-CM | POA: Diagnosis not present

## 2016-08-03 DIAGNOSIS — S73015A Posterior dislocation of left hip, initial encounter: Secondary | ICD-10-CM | POA: Diagnosis not present

## 2016-08-03 DIAGNOSIS — S39012A Strain of muscle, fascia and tendon of lower back, initial encounter: Secondary | ICD-10-CM | POA: Diagnosis not present

## 2016-08-03 DIAGNOSIS — Z041 Encounter for examination and observation following transport accident: Secondary | ICD-10-CM | POA: Diagnosis not present

## 2016-08-03 HISTORY — DX: Unspecified fracture of left acetabulum, initial encounter for closed fracture: S32.402A

## 2016-08-03 HISTORY — DX: Multiple fractures of ribs, bilateral, initial encounter for closed fracture: S22.43XA

## 2016-08-04 ENCOUNTER — Encounter: Payer: Self-pay | Admitting: Interventional Cardiology

## 2016-08-04 DIAGNOSIS — S32442A Displaced fracture of posterior column [ilioischial] of left acetabulum, initial encounter for closed fracture: Secondary | ICD-10-CM | POA: Diagnosis not present

## 2016-08-04 DIAGNOSIS — Z978 Presence of other specified devices: Secondary | ICD-10-CM | POA: Diagnosis not present

## 2016-08-04 DIAGNOSIS — S32402A Unspecified fracture of left acetabulum, initial encounter for closed fracture: Secondary | ICD-10-CM | POA: Diagnosis not present

## 2016-08-04 DIAGNOSIS — S73012A Posterior subluxation of left hip, initial encounter: Secondary | ICD-10-CM | POA: Diagnosis not present

## 2016-08-04 DIAGNOSIS — J9811 Atelectasis: Secondary | ICD-10-CM | POA: Diagnosis not present

## 2016-08-04 DIAGNOSIS — S32422A Displaced fracture of posterior wall of left acetabulum, initial encounter for closed fracture: Secondary | ICD-10-CM | POA: Diagnosis not present

## 2016-08-06 DIAGNOSIS — Z978 Presence of other specified devices: Secondary | ICD-10-CM | POA: Diagnosis not present

## 2016-08-06 DIAGNOSIS — R918 Other nonspecific abnormal finding of lung field: Secondary | ICD-10-CM | POA: Diagnosis not present

## 2016-08-06 DIAGNOSIS — J811 Chronic pulmonary edema: Secondary | ICD-10-CM | POA: Diagnosis not present

## 2016-08-06 DIAGNOSIS — R000 Tachycardia, unspecified: Secondary | ICD-10-CM | POA: Diagnosis not present

## 2016-08-06 DIAGNOSIS — J9811 Atelectasis: Secondary | ICD-10-CM | POA: Diagnosis not present

## 2016-08-07 DIAGNOSIS — R918 Other nonspecific abnormal finding of lung field: Secondary | ICD-10-CM | POA: Diagnosis not present

## 2016-08-07 DIAGNOSIS — Z978 Presence of other specified devices: Secondary | ICD-10-CM | POA: Diagnosis not present

## 2016-08-08 DIAGNOSIS — J849 Interstitial pulmonary disease, unspecified: Secondary | ICD-10-CM | POA: Diagnosis not present

## 2016-08-08 DIAGNOSIS — J90 Pleural effusion, not elsewhere classified: Secondary | ICD-10-CM | POA: Diagnosis not present

## 2016-08-12 ENCOUNTER — Telehealth: Payer: Self-pay | Admitting: Cardiology

## 2016-08-12 NOTE — Telephone Encounter (Signed)
Attempted to call pt b/c his home monitor has not updated in the last 14 days. No answer and unable to leave a message.   

## 2016-08-13 DIAGNOSIS — I2510 Atherosclerotic heart disease of native coronary artery without angina pectoris: Secondary | ICD-10-CM | POA: Diagnosis not present

## 2016-08-13 DIAGNOSIS — Z7409 Other reduced mobility: Secondary | ICD-10-CM | POA: Diagnosis not present

## 2016-08-13 DIAGNOSIS — I4891 Unspecified atrial fibrillation: Secondary | ICD-10-CM | POA: Diagnosis not present

## 2016-08-13 DIAGNOSIS — Z720 Tobacco use: Secondary | ICD-10-CM | POA: Diagnosis not present

## 2016-08-13 DIAGNOSIS — R410 Disorientation, unspecified: Secondary | ICD-10-CM | POA: Diagnosis not present

## 2016-08-13 DIAGNOSIS — E870 Hyperosmolality and hypernatremia: Secondary | ICD-10-CM | POA: Diagnosis not present

## 2016-08-13 DIAGNOSIS — S2243XA Multiple fractures of ribs, bilateral, initial encounter for closed fracture: Secondary | ICD-10-CM | POA: Diagnosis not present

## 2016-08-13 DIAGNOSIS — E785 Hyperlipidemia, unspecified: Secondary | ICD-10-CM | POA: Diagnosis not present

## 2016-08-13 DIAGNOSIS — R0689 Other abnormalities of breathing: Secondary | ICD-10-CM | POA: Diagnosis not present

## 2016-08-13 DIAGNOSIS — I6930 Unspecified sequelae of cerebral infarction: Secondary | ICD-10-CM | POA: Diagnosis not present

## 2016-08-13 DIAGNOSIS — I10 Essential (primary) hypertension: Secondary | ICD-10-CM | POA: Diagnosis not present

## 2016-08-13 DIAGNOSIS — S32402A Unspecified fracture of left acetabulum, initial encounter for closed fracture: Secondary | ICD-10-CM | POA: Diagnosis not present

## 2016-08-14 DIAGNOSIS — E785 Hyperlipidemia, unspecified: Secondary | ICD-10-CM | POA: Diagnosis not present

## 2016-08-14 DIAGNOSIS — G8911 Acute pain due to trauma: Secondary | ICD-10-CM | POA: Diagnosis not present

## 2016-08-14 DIAGNOSIS — I6930 Unspecified sequelae of cerebral infarction: Secondary | ICD-10-CM | POA: Diagnosis not present

## 2016-08-14 DIAGNOSIS — S2243XA Multiple fractures of ribs, bilateral, initial encounter for closed fracture: Secondary | ICD-10-CM | POA: Diagnosis not present

## 2016-08-14 DIAGNOSIS — R0689 Other abnormalities of breathing: Secondary | ICD-10-CM | POA: Diagnosis not present

## 2016-08-14 DIAGNOSIS — S32402A Unspecified fracture of left acetabulum, initial encounter for closed fracture: Secondary | ICD-10-CM | POA: Diagnosis not present

## 2016-08-14 DIAGNOSIS — I2510 Atherosclerotic heart disease of native coronary artery without angina pectoris: Secondary | ICD-10-CM | POA: Diagnosis not present

## 2016-08-14 DIAGNOSIS — I10 Essential (primary) hypertension: Secondary | ICD-10-CM | POA: Diagnosis not present

## 2016-08-14 DIAGNOSIS — D62 Acute posthemorrhagic anemia: Secondary | ICD-10-CM | POA: Diagnosis not present

## 2016-08-14 DIAGNOSIS — R410 Disorientation, unspecified: Secondary | ICD-10-CM | POA: Diagnosis not present

## 2016-08-14 DIAGNOSIS — I4891 Unspecified atrial fibrillation: Secondary | ICD-10-CM | POA: Diagnosis not present

## 2016-08-15 DIAGNOSIS — D62 Acute posthemorrhagic anemia: Secondary | ICD-10-CM | POA: Diagnosis not present

## 2016-08-15 DIAGNOSIS — S32402A Unspecified fracture of left acetabulum, initial encounter for closed fracture: Secondary | ICD-10-CM | POA: Diagnosis not present

## 2016-08-15 DIAGNOSIS — I10 Essential (primary) hypertension: Secondary | ICD-10-CM | POA: Diagnosis not present

## 2016-08-15 DIAGNOSIS — I2510 Atherosclerotic heart disease of native coronary artery without angina pectoris: Secondary | ICD-10-CM | POA: Diagnosis not present

## 2016-08-15 DIAGNOSIS — G8911 Acute pain due to trauma: Secondary | ICD-10-CM | POA: Diagnosis not present

## 2016-08-15 DIAGNOSIS — R0689 Other abnormalities of breathing: Secondary | ICD-10-CM | POA: Diagnosis not present

## 2016-08-15 DIAGNOSIS — I6930 Unspecified sequelae of cerebral infarction: Secondary | ICD-10-CM | POA: Diagnosis not present

## 2016-08-15 DIAGNOSIS — S2243XA Multiple fractures of ribs, bilateral, initial encounter for closed fracture: Secondary | ICD-10-CM | POA: Diagnosis not present

## 2016-08-15 DIAGNOSIS — I4891 Unspecified atrial fibrillation: Secondary | ICD-10-CM | POA: Diagnosis not present

## 2016-08-15 DIAGNOSIS — R410 Disorientation, unspecified: Secondary | ICD-10-CM | POA: Diagnosis not present

## 2016-08-15 DIAGNOSIS — E785 Hyperlipidemia, unspecified: Secondary | ICD-10-CM | POA: Diagnosis not present

## 2016-08-16 DIAGNOSIS — I2510 Atherosclerotic heart disease of native coronary artery without angina pectoris: Secondary | ICD-10-CM | POA: Diagnosis not present

## 2016-08-16 DIAGNOSIS — S2243XA Multiple fractures of ribs, bilateral, initial encounter for closed fracture: Secondary | ICD-10-CM | POA: Diagnosis not present

## 2016-08-16 DIAGNOSIS — G8911 Acute pain due to trauma: Secondary | ICD-10-CM | POA: Diagnosis not present

## 2016-08-16 DIAGNOSIS — I10 Essential (primary) hypertension: Secondary | ICD-10-CM | POA: Diagnosis not present

## 2016-08-16 DIAGNOSIS — I4891 Unspecified atrial fibrillation: Secondary | ICD-10-CM | POA: Diagnosis not present

## 2016-08-16 DIAGNOSIS — R0689 Other abnormalities of breathing: Secondary | ICD-10-CM | POA: Diagnosis not present

## 2016-08-16 DIAGNOSIS — S32402A Unspecified fracture of left acetabulum, initial encounter for closed fracture: Secondary | ICD-10-CM | POA: Diagnosis not present

## 2016-08-16 DIAGNOSIS — E785 Hyperlipidemia, unspecified: Secondary | ICD-10-CM | POA: Diagnosis not present

## 2016-08-17 ENCOUNTER — Ambulatory Visit: Payer: 59 | Admitting: Interventional Cardiology

## 2016-08-19 ENCOUNTER — Encounter: Payer: Self-pay | Admitting: Cardiology

## 2016-08-20 ENCOUNTER — Ambulatory Visit (INDEPENDENT_AMBULATORY_CARE_PROVIDER_SITE_OTHER): Payer: 59 | Admitting: *Deleted

## 2016-08-20 DIAGNOSIS — I639 Cerebral infarction, unspecified: Secondary | ICD-10-CM | POA: Diagnosis not present

## 2016-08-20 NOTE — Progress Notes (Signed)
Carelink Summary Report / Loop Recorder 

## 2016-08-21 DIAGNOSIS — R0689 Other abnormalities of breathing: Secondary | ICD-10-CM | POA: Diagnosis not present

## 2016-08-21 DIAGNOSIS — R410 Disorientation, unspecified: Secondary | ICD-10-CM | POA: Diagnosis not present

## 2016-08-21 DIAGNOSIS — I4891 Unspecified atrial fibrillation: Secondary | ICD-10-CM | POA: Diagnosis not present

## 2016-08-21 DIAGNOSIS — S32402A Unspecified fracture of left acetabulum, initial encounter for closed fracture: Secondary | ICD-10-CM | POA: Diagnosis not present

## 2016-08-21 DIAGNOSIS — I10 Essential (primary) hypertension: Secondary | ICD-10-CM | POA: Diagnosis not present

## 2016-08-21 DIAGNOSIS — G8911 Acute pain due to trauma: Secondary | ICD-10-CM | POA: Diagnosis not present

## 2016-08-21 DIAGNOSIS — I69354 Hemiplegia and hemiparesis following cerebral infarction affecting left non-dominant side: Secondary | ICD-10-CM | POA: Diagnosis not present

## 2016-08-21 DIAGNOSIS — S2243XA Multiple fractures of ribs, bilateral, initial encounter for closed fracture: Secondary | ICD-10-CM | POA: Diagnosis not present

## 2016-08-21 DIAGNOSIS — E785 Hyperlipidemia, unspecified: Secondary | ICD-10-CM | POA: Diagnosis not present

## 2016-08-21 DIAGNOSIS — I2510 Atherosclerotic heart disease of native coronary artery without angina pectoris: Secondary | ICD-10-CM | POA: Diagnosis not present

## 2016-08-23 DIAGNOSIS — Z9889 Other specified postprocedural states: Secondary | ICD-10-CM | POA: Diagnosis not present

## 2016-08-23 DIAGNOSIS — R4182 Altered mental status, unspecified: Secondary | ICD-10-CM | POA: Diagnosis not present

## 2016-08-23 DIAGNOSIS — J90 Pleural effusion, not elsewhere classified: Secondary | ICD-10-CM | POA: Diagnosis not present

## 2016-08-23 DIAGNOSIS — J942 Hemothorax: Secondary | ICD-10-CM | POA: Diagnosis not present

## 2016-08-23 DIAGNOSIS — S79912A Unspecified injury of left hip, initial encounter: Secondary | ICD-10-CM | POA: Diagnosis not present

## 2016-08-23 DIAGNOSIS — R918 Other nonspecific abnormal finding of lung field: Secondary | ICD-10-CM | POA: Diagnosis not present

## 2016-08-23 DIAGNOSIS — S32422A Displaced fracture of posterior wall of left acetabulum, initial encounter for closed fracture: Secondary | ICD-10-CM | POA: Diagnosis not present

## 2016-08-23 DIAGNOSIS — S73015A Posterior dislocation of left hip, initial encounter: Secondary | ICD-10-CM | POA: Diagnosis not present

## 2016-08-23 DIAGNOSIS — I700 Atherosclerosis of aorta: Secondary | ICD-10-CM | POA: Diagnosis not present

## 2016-08-23 DIAGNOSIS — J9811 Atelectasis: Secondary | ICD-10-CM | POA: Diagnosis not present

## 2016-08-24 DIAGNOSIS — S271XXA Traumatic hemothorax, initial encounter: Secondary | ICD-10-CM | POA: Diagnosis not present

## 2016-08-24 DIAGNOSIS — S2241XA Multiple fractures of ribs, right side, initial encounter for closed fracture: Secondary | ICD-10-CM | POA: Diagnosis not present

## 2016-08-24 DIAGNOSIS — R9431 Abnormal electrocardiogram [ECG] [EKG]: Secondary | ICD-10-CM | POA: Diagnosis not present

## 2016-08-24 DIAGNOSIS — R918 Other nonspecific abnormal finding of lung field: Secondary | ICD-10-CM | POA: Diagnosis not present

## 2016-08-24 DIAGNOSIS — J9811 Atelectasis: Secondary | ICD-10-CM | POA: Diagnosis not present

## 2016-08-24 DIAGNOSIS — J90 Pleural effusion, not elsewhere classified: Secondary | ICD-10-CM | POA: Diagnosis not present

## 2016-08-24 DIAGNOSIS — Z978 Presence of other specified devices: Secondary | ICD-10-CM | POA: Diagnosis not present

## 2016-08-24 DIAGNOSIS — J939 Pneumothorax, unspecified: Secondary | ICD-10-CM | POA: Diagnosis not present

## 2016-08-24 DIAGNOSIS — T80319A ABO incompatibility with hemolytic transfusion reaction, unspecified, initial encounter: Secondary | ICD-10-CM | POA: Diagnosis not present

## 2016-08-25 DIAGNOSIS — S72062A Displaced articular fracture of head of left femur, initial encounter for closed fracture: Secondary | ICD-10-CM | POA: Diagnosis not present

## 2016-08-25 DIAGNOSIS — J942 Hemothorax: Secondary | ICD-10-CM | POA: Diagnosis not present

## 2016-08-25 DIAGNOSIS — T83028A Displacement of other indwelling urethral catheter, initial encounter: Secondary | ICD-10-CM | POA: Diagnosis not present

## 2016-08-25 DIAGNOSIS — J9811 Atelectasis: Secondary | ICD-10-CM | POA: Diagnosis not present

## 2016-08-25 DIAGNOSIS — E785 Hyperlipidemia, unspecified: Secondary | ICD-10-CM | POA: Diagnosis not present

## 2016-08-25 DIAGNOSIS — R918 Other nonspecific abnormal finding of lung field: Secondary | ICD-10-CM | POA: Diagnosis not present

## 2016-08-25 DIAGNOSIS — I2510 Atherosclerotic heart disease of native coronary artery without angina pectoris: Secondary | ICD-10-CM | POA: Diagnosis not present

## 2016-08-25 DIAGNOSIS — I10 Essential (primary) hypertension: Secondary | ICD-10-CM | POA: Diagnosis not present

## 2016-08-25 DIAGNOSIS — S73002A Unspecified subluxation of left hip, initial encounter: Secondary | ICD-10-CM | POA: Diagnosis not present

## 2016-08-25 DIAGNOSIS — R0689 Other abnormalities of breathing: Secondary | ICD-10-CM | POA: Diagnosis not present

## 2016-08-25 DIAGNOSIS — S73005A Unspecified dislocation of left hip, initial encounter: Secondary | ICD-10-CM | POA: Diagnosis not present

## 2016-08-25 DIAGNOSIS — S271XXA Traumatic hemothorax, initial encounter: Secondary | ICD-10-CM | POA: Diagnosis not present

## 2016-08-25 DIAGNOSIS — Z978 Presence of other specified devices: Secondary | ICD-10-CM | POA: Diagnosis not present

## 2016-08-25 DIAGNOSIS — D62 Acute posthemorrhagic anemia: Secondary | ICD-10-CM | POA: Diagnosis not present

## 2016-08-25 DIAGNOSIS — I4891 Unspecified atrial fibrillation: Secondary | ICD-10-CM | POA: Diagnosis not present

## 2016-08-25 DIAGNOSIS — I69354 Hemiplegia and hemiparesis following cerebral infarction affecting left non-dominant side: Secondary | ICD-10-CM | POA: Diagnosis not present

## 2016-08-25 DIAGNOSIS — J90 Pleural effusion, not elsewhere classified: Secondary | ICD-10-CM | POA: Diagnosis not present

## 2016-08-26 DIAGNOSIS — I2510 Atherosclerotic heart disease of native coronary artery without angina pectoris: Secondary | ICD-10-CM | POA: Diagnosis not present

## 2016-08-26 DIAGNOSIS — E785 Hyperlipidemia, unspecified: Secondary | ICD-10-CM | POA: Diagnosis not present

## 2016-08-26 DIAGNOSIS — I10 Essential (primary) hypertension: Secondary | ICD-10-CM | POA: Diagnosis not present

## 2016-08-26 DIAGNOSIS — R0689 Other abnormalities of breathing: Secondary | ICD-10-CM | POA: Diagnosis not present

## 2016-08-26 DIAGNOSIS — I69354 Hemiplegia and hemiparesis following cerebral infarction affecting left non-dominant side: Secondary | ICD-10-CM | POA: Diagnosis not present

## 2016-08-26 DIAGNOSIS — I4891 Unspecified atrial fibrillation: Secondary | ICD-10-CM | POA: Diagnosis not present

## 2016-08-26 DIAGNOSIS — D62 Acute posthemorrhagic anemia: Secondary | ICD-10-CM | POA: Diagnosis not present

## 2016-08-26 DIAGNOSIS — J90 Pleural effusion, not elsewhere classified: Secondary | ICD-10-CM | POA: Diagnosis not present

## 2016-08-26 DIAGNOSIS — J9811 Atelectasis: Secondary | ICD-10-CM | POA: Diagnosis not present

## 2016-08-27 DIAGNOSIS — R0689 Other abnormalities of breathing: Secondary | ICD-10-CM | POA: Diagnosis not present

## 2016-08-27 DIAGNOSIS — R918 Other nonspecific abnormal finding of lung field: Secondary | ICD-10-CM | POA: Diagnosis not present

## 2016-08-27 DIAGNOSIS — I10 Essential (primary) hypertension: Secondary | ICD-10-CM | POA: Diagnosis not present

## 2016-08-27 DIAGNOSIS — E785 Hyperlipidemia, unspecified: Secondary | ICD-10-CM | POA: Diagnosis not present

## 2016-08-27 DIAGNOSIS — I4891 Unspecified atrial fibrillation: Secondary | ICD-10-CM | POA: Diagnosis not present

## 2016-08-27 DIAGNOSIS — J90 Pleural effusion, not elsewhere classified: Secondary | ICD-10-CM | POA: Diagnosis not present

## 2016-08-27 DIAGNOSIS — D62 Acute posthemorrhagic anemia: Secondary | ICD-10-CM | POA: Diagnosis not present

## 2016-08-27 DIAGNOSIS — I2510 Atherosclerotic heart disease of native coronary artery without angina pectoris: Secondary | ICD-10-CM | POA: Diagnosis not present

## 2016-08-27 DIAGNOSIS — I69354 Hemiplegia and hemiparesis following cerebral infarction affecting left non-dominant side: Secondary | ICD-10-CM | POA: Diagnosis not present

## 2016-08-28 DIAGNOSIS — R918 Other nonspecific abnormal finding of lung field: Secondary | ICD-10-CM | POA: Diagnosis not present

## 2016-08-28 DIAGNOSIS — S2242XD Multiple fractures of ribs, left side, subsequent encounter for fracture with routine healing: Secondary | ICD-10-CM | POA: Diagnosis not present

## 2016-08-28 DIAGNOSIS — I4891 Unspecified atrial fibrillation: Secondary | ICD-10-CM | POA: Diagnosis not present

## 2016-08-28 DIAGNOSIS — D62 Acute posthemorrhagic anemia: Secondary | ICD-10-CM | POA: Diagnosis not present

## 2016-08-28 DIAGNOSIS — I10 Essential (primary) hypertension: Secondary | ICD-10-CM | POA: Diagnosis not present

## 2016-08-28 DIAGNOSIS — R0689 Other abnormalities of breathing: Secondary | ICD-10-CM | POA: Diagnosis not present

## 2016-08-28 DIAGNOSIS — S73015A Posterior dislocation of left hip, initial encounter: Secondary | ICD-10-CM | POA: Diagnosis not present

## 2016-08-28 DIAGNOSIS — E785 Hyperlipidemia, unspecified: Secondary | ICD-10-CM | POA: Diagnosis not present

## 2016-08-28 DIAGNOSIS — I69354 Hemiplegia and hemiparesis following cerebral infarction affecting left non-dominant side: Secondary | ICD-10-CM | POA: Diagnosis not present

## 2016-08-28 DIAGNOSIS — J90 Pleural effusion, not elsewhere classified: Secondary | ICD-10-CM | POA: Diagnosis not present

## 2016-08-28 DIAGNOSIS — I2510 Atherosclerotic heart disease of native coronary artery without angina pectoris: Secondary | ICD-10-CM | POA: Diagnosis not present

## 2016-08-29 DIAGNOSIS — R918 Other nonspecific abnormal finding of lung field: Secondary | ICD-10-CM | POA: Diagnosis not present

## 2016-08-29 DIAGNOSIS — J90 Pleural effusion, not elsewhere classified: Secondary | ICD-10-CM | POA: Diagnosis not present

## 2016-08-29 DIAGNOSIS — S2242XA Multiple fractures of ribs, left side, initial encounter for closed fracture: Secondary | ICD-10-CM | POA: Diagnosis not present

## 2016-09-01 LAB — CUP PACEART REMOTE DEVICE CHECK
Date Time Interrogation Session: 20180413061102
Implantable Pulse Generator Implant Date: 20160323

## 2016-09-02 ENCOUNTER — Encounter: Payer: Self-pay | Admitting: Cardiology

## 2016-09-05 DIAGNOSIS — Z7901 Long term (current) use of anticoagulants: Secondary | ICD-10-CM | POA: Diagnosis not present

## 2016-09-05 DIAGNOSIS — I2510 Atherosclerotic heart disease of native coronary artery without angina pectoris: Secondary | ICD-10-CM | POA: Diagnosis not present

## 2016-09-05 DIAGNOSIS — F329 Major depressive disorder, single episode, unspecified: Secondary | ICD-10-CM | POA: Diagnosis not present

## 2016-09-05 DIAGNOSIS — E785 Hyperlipidemia, unspecified: Secondary | ICD-10-CM | POA: Diagnosis not present

## 2016-09-05 DIAGNOSIS — I10 Essential (primary) hypertension: Secondary | ICD-10-CM | POA: Diagnosis not present

## 2016-09-05 DIAGNOSIS — I69354 Hemiplegia and hemiparesis following cerebral infarction affecting left non-dominant side: Secondary | ICD-10-CM | POA: Diagnosis not present

## 2016-09-05 DIAGNOSIS — R0689 Other abnormalities of breathing: Secondary | ICD-10-CM | POA: Diagnosis not present

## 2016-09-05 DIAGNOSIS — I4891 Unspecified atrial fibrillation: Secondary | ICD-10-CM | POA: Diagnosis not present

## 2016-09-06 DIAGNOSIS — I4891 Unspecified atrial fibrillation: Secondary | ICD-10-CM | POA: Diagnosis not present

## 2016-09-06 DIAGNOSIS — I10 Essential (primary) hypertension: Secondary | ICD-10-CM | POA: Diagnosis not present

## 2016-09-06 DIAGNOSIS — I69354 Hemiplegia and hemiparesis following cerebral infarction affecting left non-dominant side: Secondary | ICD-10-CM | POA: Diagnosis not present

## 2016-09-06 DIAGNOSIS — E785 Hyperlipidemia, unspecified: Secondary | ICD-10-CM | POA: Diagnosis not present

## 2016-09-06 DIAGNOSIS — R0689 Other abnormalities of breathing: Secondary | ICD-10-CM | POA: Diagnosis not present

## 2016-09-06 DIAGNOSIS — I2510 Atherosclerotic heart disease of native coronary artery without angina pectoris: Secondary | ICD-10-CM | POA: Diagnosis not present

## 2016-09-06 DIAGNOSIS — Z7901 Long term (current) use of anticoagulants: Secondary | ICD-10-CM | POA: Diagnosis not present

## 2016-09-20 ENCOUNTER — Encounter: Payer: 59 | Admitting: *Deleted

## 2016-11-19 ENCOUNTER — Other Ambulatory Visit: Payer: Self-pay | Admitting: Physician Assistant

## 2016-11-19 DIAGNOSIS — E785 Hyperlipidemia, unspecified: Secondary | ICD-10-CM

## 2016-12-06 ENCOUNTER — Other Ambulatory Visit: Payer: Self-pay | Admitting: Physician Assistant

## 2016-12-06 DIAGNOSIS — I10 Essential (primary) hypertension: Secondary | ICD-10-CM

## 2016-12-14 DIAGNOSIS — S73005A Unspecified dislocation of left hip, initial encounter: Secondary | ICD-10-CM | POA: Insufficient documentation

## 2016-12-14 HISTORY — DX: Unspecified dislocation of left hip, initial encounter: S73.005A

## 2016-12-23 ENCOUNTER — Ambulatory Visit (INDEPENDENT_AMBULATORY_CARE_PROVIDER_SITE_OTHER): Payer: 59 | Admitting: Psychology

## 2016-12-23 DIAGNOSIS — F1020 Alcohol dependence, uncomplicated: Secondary | ICD-10-CM

## 2016-12-25 ENCOUNTER — Other Ambulatory Visit: Payer: Self-pay | Admitting: Interventional Cardiology

## 2016-12-25 DIAGNOSIS — E785 Hyperlipidemia, unspecified: Secondary | ICD-10-CM

## 2017-01-15 ENCOUNTER — Encounter (HOSPITAL_COMMUNITY): Payer: Self-pay | Admitting: Psychology

## 2017-01-15 DIAGNOSIS — F1020 Alcohol dependence, uncomplicated: Secondary | ICD-10-CM

## 2017-01-15 HISTORY — DX: Alcohol dependence, uncomplicated: F10.20
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
## 2017-01-17 ENCOUNTER — Other Ambulatory Visit (HOSPITAL_COMMUNITY): Payer: 59 | Attending: Psychiatry | Admitting: Psychology

## 2017-01-17 DIAGNOSIS — I2510 Atherosclerotic heart disease of native coronary artery without angina pectoris: Secondary | ICD-10-CM | POA: Insufficient documentation

## 2017-01-17 DIAGNOSIS — F17210 Nicotine dependence, cigarettes, uncomplicated: Secondary | ICD-10-CM | POA: Insufficient documentation

## 2017-01-17 DIAGNOSIS — F4310 Post-traumatic stress disorder, unspecified: Secondary | ICD-10-CM | POA: Insufficient documentation

## 2017-01-17 DIAGNOSIS — F1994 Other psychoactive substance use, unspecified with psychoactive substance-induced mood disorder: Secondary | ICD-10-CM | POA: Insufficient documentation

## 2017-01-17 DIAGNOSIS — F1020 Alcohol dependence, uncomplicated: Secondary | ICD-10-CM | POA: Insufficient documentation

## 2017-01-17 DIAGNOSIS — Z7982 Long term (current) use of aspirin: Secondary | ICD-10-CM | POA: Insufficient documentation

## 2017-01-17 DIAGNOSIS — Z79899 Other long term (current) drug therapy: Secondary | ICD-10-CM | POA: Insufficient documentation

## 2017-01-17 DIAGNOSIS — Z6372 Alcoholism and drug addiction in family: Secondary | ICD-10-CM | POA: Insufficient documentation

## 2017-01-17 DIAGNOSIS — F419 Anxiety disorder, unspecified: Secondary | ICD-10-CM | POA: Insufficient documentation

## 2017-01-17 DIAGNOSIS — I10 Essential (primary) hypertension: Secondary | ICD-10-CM | POA: Insufficient documentation

## 2017-01-17 DIAGNOSIS — Z7409 Other reduced mobility: Secondary | ICD-10-CM | POA: Insufficient documentation

## 2017-01-17 DIAGNOSIS — Z85828 Personal history of other malignant neoplasm of skin: Secondary | ICD-10-CM | POA: Insufficient documentation

## 2017-01-17 DIAGNOSIS — Z8673 Personal history of transient ischemic attack (TIA), and cerebral infarction without residual deficits: Secondary | ICD-10-CM | POA: Insufficient documentation

## 2017-01-17 DIAGNOSIS — Z62819 Personal history of unspecified abuse in childhood: Secondary | ICD-10-CM | POA: Insufficient documentation

## 2017-01-17 DIAGNOSIS — F4312 Post-traumatic stress disorder, chronic: Secondary | ICD-10-CM | POA: Insufficient documentation

## 2017-01-17 DIAGNOSIS — F329 Major depressive disorder, single episode, unspecified: Secondary | ICD-10-CM | POA: Insufficient documentation

## 2017-01-17 DIAGNOSIS — E785 Hyperlipidemia, unspecified: Secondary | ICD-10-CM | POA: Insufficient documentation

## 2017-01-19 ENCOUNTER — Encounter (HOSPITAL_COMMUNITY): Payer: Self-pay | Admitting: Medical

## 2017-01-19 ENCOUNTER — Other Ambulatory Visit (HOSPITAL_COMMUNITY): Payer: 59 | Admitting: Psychology

## 2017-01-19 DIAGNOSIS — Z62819 Personal history of unspecified abuse in childhood: Secondary | ICD-10-CM

## 2017-01-19 DIAGNOSIS — F4310 Post-traumatic stress disorder, unspecified: Secondary | ICD-10-CM

## 2017-01-19 DIAGNOSIS — Z9889 Other specified postprocedural states: Secondary | ICD-10-CM

## 2017-01-19 DIAGNOSIS — Z7409 Other reduced mobility: Secondary | ICD-10-CM

## 2017-01-19 DIAGNOSIS — F1994 Other psychoactive substance use, unspecified with psychoactive substance-induced mood disorder: Secondary | ICD-10-CM

## 2017-01-19 DIAGNOSIS — Z9861 Coronary angioplasty status: Secondary | ICD-10-CM

## 2017-01-19 DIAGNOSIS — Z6372 Alcoholism and drug addiction in family: Secondary | ICD-10-CM | POA: Insufficient documentation

## 2017-01-19 DIAGNOSIS — I639 Cerebral infarction, unspecified: Secondary | ICD-10-CM | POA: Insufficient documentation

## 2017-01-19 DIAGNOSIS — F4312 Post-traumatic stress disorder, chronic: Secondary | ICD-10-CM

## 2017-01-19 DIAGNOSIS — S32402S Unspecified fracture of left acetabulum, sequela: Secondary | ICD-10-CM

## 2017-01-19 DIAGNOSIS — E785 Hyperlipidemia, unspecified: Secondary | ICD-10-CM

## 2017-01-19 DIAGNOSIS — I10 Essential (primary) hypertension: Secondary | ICD-10-CM

## 2017-01-19 DIAGNOSIS — I6340 Cerebral infarction due to embolism of unspecified cerebral artery: Secondary | ICD-10-CM

## 2017-01-19 DIAGNOSIS — F418 Other specified anxiety disorders: Secondary | ICD-10-CM

## 2017-01-19 DIAGNOSIS — F1020 Alcohol dependence, uncomplicated: Secondary | ICD-10-CM

## 2017-01-19 DIAGNOSIS — I4891 Unspecified atrial fibrillation: Secondary | ICD-10-CM | POA: Insufficient documentation

## 2017-01-19 DIAGNOSIS — I2510 Atherosclerotic heart disease of native coronary artery without angina pectoris: Secondary | ICD-10-CM

## 2017-01-19 DIAGNOSIS — I200 Unstable angina: Secondary | ICD-10-CM

## 2017-01-19 DIAGNOSIS — F17210 Nicotine dependence, cigarettes, uncomplicated: Secondary | ICD-10-CM

## 2017-01-19 HISTORY — DX: Other specified postprocedural states: Z98.890
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
## 2017-01-20 ENCOUNTER — Encounter (HOSPITAL_COMMUNITY): Payer: Self-pay | Admitting: Psychology

## 2017-01-20 ENCOUNTER — Other Ambulatory Visit (HOSPITAL_COMMUNITY): Payer: 59 | Admitting: Psychology

## 2017-01-20 DIAGNOSIS — Z62819 Personal history of unspecified abuse in childhood: Secondary | ICD-10-CM

## 2017-01-20 DIAGNOSIS — F1994 Other psychoactive substance use, unspecified with psychoactive substance-induced mood disorder: Secondary | ICD-10-CM

## 2017-01-20 DIAGNOSIS — F4310 Post-traumatic stress disorder, unspecified: Secondary | ICD-10-CM

## 2017-01-20 DIAGNOSIS — F1020 Alcohol dependence, uncomplicated: Secondary | ICD-10-CM

## 2017-01-20 HISTORY — DX: Personal history of unspecified abuse in childhood: Z62.819
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
## 2017-01-21 ENCOUNTER — Encounter (HOSPITAL_COMMUNITY): Payer: Self-pay | Admitting: Psychology

## 2017-01-21 NOTE — Progress Notes (Signed)
    Daily Group Progress Note  Program: CD-IOP   01/21/2017 Charlynne Cousins [id number]  Diagnosis:  Alcohol use disorder, severe, dependence ([place])  Substance induced mood disorder (HCC)  PTSD (post-traumatic stress disorder)   Sobriety Date: 3/27  Group Time: 1-2:30  Participation Level: Active  Behavioral Response: Appropriate and Sharing  Type of Therapy: Process Group  Interventions: CBT  Topic: Patients were active and engaged for group process session. UDS results were returned to some individuals. Pts were guided by counselors in sharing about their challenges and successes in early recovery. Pts encouraged each other and offered helpful and supportive feedback to the group. Two new members increased their disclosures to group and appear to be fitting into group well. One member graduated successfully and was provided feedback and a remembrance token.     Group Time: 2:30-4  Participation Level: Active  Behavioral Response: Appropriate and Sharing  Type of Therapy: Process Group  Interventions: CBT  Topic: Patients were active and engaged in group psychoeducation session. Pts were led in a topical discussion about recovery, 12 steps, PAWS symptoms, and other early recovery topics. Pts shared their thoughts and questions about the topics and asked the group for feedback and help.    Summary: Patient was active and engaged in group. He participated less actively than previous session but appeared to listened actively. He shared that he attended 1 AA meeting since yesterday. He expressed his gratitude to "God" for helping keep him sober. Pt was silent for the discussion on faith which was marked primarily w/ negative reactions to faith/religion. Pt shared about his "daily inventory" and reported that he has a hx of taking daily inventory but that often it was "an inventory of his alcohol consumption".    UDS collected: No Results: negative  AA/NA attended?:  YesThursday  Sponsor?: No   Youlanda Roys, LPCA LCASA 01/21/2017 12:59 PM

## 2017-01-24 ENCOUNTER — Other Ambulatory Visit (HOSPITAL_COMMUNITY): Payer: 59 | Admitting: Psychology

## 2017-01-24 ENCOUNTER — Encounter (HOSPITAL_COMMUNITY): Payer: Self-pay | Admitting: Licensed Clinical Social Worker

## 2017-01-24 DIAGNOSIS — F1994 Other psychoactive substance use, unspecified with psychoactive substance-induced mood disorder: Secondary | ICD-10-CM

## 2017-01-24 DIAGNOSIS — F4310 Post-traumatic stress disorder, unspecified: Secondary | ICD-10-CM

## 2017-01-24 DIAGNOSIS — F1020 Alcohol dependence, uncomplicated: Secondary | ICD-10-CM

## 2017-01-24 NOTE — Progress Notes (Signed)
Daniel [name] PLANNING SESSION  THERAPIST PROGRESS NOTE  Session Time: 4-5pm  Participation Level: Active  Behavioral Response: Neat and Well GroomedAlertEuthymic  Type of Therapy: Individual Therapy  Treatment Goals addressed: Diagnosis: SUD  Interventions: CBT and Motivational Interviewing  Summary: Daniel [name] is a 66 y.o. male who presents for his first individual session as part of CDIOP tx. He and counselor discuss tx plan and sign all documentation stating pt was involved in creation of a person-centered tx plan. Pt spends majority of session discussing his past hx and struggles w/ alcoholism.  Pt discusses his long hx of drinking to "impress his colleagues and clients" and get validation from others.    Pt identified 2 person-centered goals of 1. Improve his mood and learn to manage his depression 2. Improving his communication w/ his relatives and explaining his alcoholism to others.  Suicidal/Homicidal: Nowithout intent/plan  Therapist Response: Counselor used open questions and person-centered planning to help pt identify his goals and discuss his hx of alcoholism.  Plan: Return again in 1 weeks.  Diagnosis:    ICD-10-CM   1. Alcohol use disorder, severe, dependence ([place]) F10.20   2. Substance induced mood disorder (HCC) F19.94   3. PTSD (post-traumatic stress disorder) F43.10        Archie Balboa, LCAS-A 01/24/2017

## 2017-01-25 ENCOUNTER — Telehealth (HOSPITAL_COMMUNITY): Payer: Self-pay | Admitting: Psychology

## 2017-01-25 ENCOUNTER — Encounter (HOSPITAL_COMMUNITY): Payer: Self-pay | Admitting: Psychology
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
## 2017-01-26 ENCOUNTER — Telehealth (HOSPITAL_COMMUNITY): Payer: Self-pay | Admitting: Psychology

## 2017-01-26 ENCOUNTER — Other Ambulatory Visit (HOSPITAL_COMMUNITY): Payer: 59

## 2017-01-27 ENCOUNTER — Other Ambulatory Visit (HOSPITAL_COMMUNITY): Payer: 59 | Admitting: Psychology

## 2017-01-27 DIAGNOSIS — F1994 Other psychoactive substance use, unspecified with psychoactive substance-induced mood disorder: Secondary | ICD-10-CM | POA: Diagnosis not present

## 2017-01-27 DIAGNOSIS — Z79899 Other long term (current) drug therapy: Secondary | ICD-10-CM | POA: Diagnosis not present

## 2017-01-27 DIAGNOSIS — Z8673 Personal history of transient ischemic attack (TIA), and cerebral infarction without residual deficits: Secondary | ICD-10-CM | POA: Diagnosis not present

## 2017-01-27 DIAGNOSIS — E785 Hyperlipidemia, unspecified: Secondary | ICD-10-CM | POA: Diagnosis not present

## 2017-01-27 DIAGNOSIS — Z62819 Personal history of unspecified abuse in childhood: Secondary | ICD-10-CM | POA: Diagnosis not present

## 2017-01-27 DIAGNOSIS — Z7409 Other reduced mobility: Secondary | ICD-10-CM | POA: Diagnosis not present

## 2017-01-27 DIAGNOSIS — I10 Essential (primary) hypertension: Secondary | ICD-10-CM | POA: Diagnosis not present

## 2017-01-27 DIAGNOSIS — F4312 Post-traumatic stress disorder, chronic: Secondary | ICD-10-CM | POA: Diagnosis not present

## 2017-01-27 DIAGNOSIS — F419 Anxiety disorder, unspecified: Secondary | ICD-10-CM | POA: Diagnosis not present

## 2017-01-27 DIAGNOSIS — F329 Major depressive disorder, single episode, unspecified: Secondary | ICD-10-CM | POA: Diagnosis not present

## 2017-01-27 DIAGNOSIS — F17210 Nicotine dependence, cigarettes, uncomplicated: Secondary | ICD-10-CM | POA: Diagnosis not present

## 2017-01-27 DIAGNOSIS — F1020 Alcohol dependence, uncomplicated: Secondary | ICD-10-CM

## 2017-01-27 DIAGNOSIS — I2510 Atherosclerotic heart disease of native coronary artery without angina pectoris: Secondary | ICD-10-CM | POA: Diagnosis not present

## 2017-01-27 DIAGNOSIS — F4310 Post-traumatic stress disorder, unspecified: Secondary | ICD-10-CM | POA: Diagnosis not present

## 2017-01-27 DIAGNOSIS — Z6372 Alcoholism and drug addiction in family: Secondary | ICD-10-CM | POA: Diagnosis not present

## 2017-01-27 DIAGNOSIS — Z7982 Long term (current) use of aspirin: Secondary | ICD-10-CM | POA: Diagnosis not present

## 2017-01-27 DIAGNOSIS — Z85828 Personal history of other malignant neoplasm of skin: Secondary | ICD-10-CM | POA: Diagnosis not present

## 2017-01-31 ENCOUNTER — Other Ambulatory Visit (HOSPITAL_COMMUNITY): Payer: 59 | Admitting: Psychology

## 2017-01-31 DIAGNOSIS — F1020 Alcohol dependence, uncomplicated: Secondary | ICD-10-CM

## 2017-02-01 ENCOUNTER — Encounter (HOSPITAL_COMMUNITY): Payer: Self-pay | Admitting: Psychology
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
## 2017-02-02 ENCOUNTER — Other Ambulatory Visit (HOSPITAL_COMMUNITY): Payer: 59 | Admitting: Psychology

## 2017-02-02 ENCOUNTER — Encounter: Payer: Self-pay | Admitting: Psychiatry

## 2017-02-02 DIAGNOSIS — F1020 Alcohol dependence, uncomplicated: Secondary | ICD-10-CM

## 2017-02-03 ENCOUNTER — Other Ambulatory Visit (HOSPITAL_COMMUNITY): Payer: 59 | Admitting: Psychology

## 2017-02-03 DIAGNOSIS — F1020 Alcohol dependence, uncomplicated: Secondary | ICD-10-CM

## 2017-02-03 DIAGNOSIS — F4310 Post-traumatic stress disorder, unspecified: Secondary | ICD-10-CM

## 2017-02-03 DIAGNOSIS — F1994 Other psychoactive substance use, unspecified with psychoactive substance-induced mood disorder: Secondary | ICD-10-CM

## 2017-02-07 ENCOUNTER — Encounter (HOSPITAL_COMMUNITY): Payer: Self-pay | Admitting: Psychology

## 2017-02-07 ENCOUNTER — Other Ambulatory Visit: Payer: Self-pay | Admitting: Interventional Cardiology

## 2017-02-07 ENCOUNTER — Other Ambulatory Visit (HOSPITAL_COMMUNITY): Payer: 59 | Attending: Psychiatry | Admitting: Psychology

## 2017-02-07 DIAGNOSIS — Z62819 Personal history of unspecified abuse in childhood: Secondary | ICD-10-CM | POA: Insufficient documentation

## 2017-02-07 DIAGNOSIS — F4310 Post-traumatic stress disorder, unspecified: Secondary | ICD-10-CM | POA: Diagnosis not present

## 2017-02-07 DIAGNOSIS — F1994 Other psychoactive substance use, unspecified with psychoactive substance-induced mood disorder: Secondary | ICD-10-CM

## 2017-02-07 DIAGNOSIS — F1020 Alcohol dependence, uncomplicated: Secondary | ICD-10-CM | POA: Diagnosis present

## 2017-02-07 DIAGNOSIS — E785 Hyperlipidemia, unspecified: Secondary | ICD-10-CM
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
## 2017-02-08 ENCOUNTER — Encounter (HOSPITAL_COMMUNITY): Payer: Self-pay | Admitting: Psychology
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
## 2017-02-09 ENCOUNTER — Encounter: Payer: Self-pay | Admitting: Psychiatry

## 2017-02-09 ENCOUNTER — Other Ambulatory Visit: Payer: Self-pay

## 2017-02-09 ENCOUNTER — Other Ambulatory Visit (HOSPITAL_COMMUNITY): Payer: 59 | Admitting: Psychology

## 2017-02-09 DIAGNOSIS — F1020 Alcohol dependence, uncomplicated: Secondary | ICD-10-CM

## 2017-02-09 DIAGNOSIS — E785 Hyperlipidemia, unspecified: Secondary | ICD-10-CM

## 2017-02-09 MED ORDER — ATORVASTATIN CALCIUM 40 MG PO TABS: ORAL_TABLET | ORAL | 0 refills | Status: DC

## 2017-02-10 ENCOUNTER — Other Ambulatory Visit (HOSPITAL_COMMUNITY): Payer: 59 | Admitting: Psychology

## 2017-02-10 ENCOUNTER — Encounter (HOSPITAL_COMMUNITY): Payer: Self-pay | Admitting: Psychology

## 2017-02-10 DIAGNOSIS — F1020 Alcohol dependence, uncomplicated: Secondary | ICD-10-CM | POA: Diagnosis not present

## 2017-02-10 DIAGNOSIS — F4310 Post-traumatic stress disorder, unspecified: Secondary | ICD-10-CM

## 2017-02-10 DIAGNOSIS — F1994 Other psychoactive substance use, unspecified with psychoactive substance-induced mood disorder: Secondary | ICD-10-CM
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
## 2017-02-13 ENCOUNTER — Encounter (HOSPITAL_COMMUNITY): Payer: Self-pay | Admitting: Psychology
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
## 2017-02-14 ENCOUNTER — Encounter (HOSPITAL_COMMUNITY): Payer: Self-pay | Admitting: Psychology

## 2017-02-14 ENCOUNTER — Other Ambulatory Visit (HOSPITAL_COMMUNITY): Payer: 59 | Admitting: Psychology

## 2017-02-14 DIAGNOSIS — F1020 Alcohol dependence, uncomplicated: Secondary | ICD-10-CM

## 2017-02-14 NOTE — Progress Notes (Signed)
    Daily Group Progress Note  Program: CD-IOP   02/14/2017 Daniel [name] [id number]  Diagnosis:  Alcohol use disorder, severe, dependence ([place])  Substance induced mood disorder ([place])  PTSD (post-traumatic stress disorder)   Sobriety Date: 3/28  Group Time: 1-2:30  Participation Level: Active  Behavioral Response: Appropriate and Sharing  Type of Therapy: Process Group  Interventions: CBT, Strength-based and Supportive  Topic: Patients were active and engaged in group process session. Pts reported on their recovery-related activities, challenges, and successes since last group. One member was asked to explain a positive UDS screening from Monday. Pt denied using and caused significant discomfort within the group which appeared to notice to change in tone and intimacy.      Group Time: 2:30-4  Participation Level: Active  Behavioral Response: Appropriate and Sharing  Type of Therapy: Psycho-education Group  Interventions: CBT and Supportive  Topic: Patients were active and engaged in group psychoeducation session. Pts discussed topical subjects on recovery such as meetings, responsibility, holidays, and denial. Pts were open and honest during session. Counselor helped pts process the disruption to group dynamic w/ the new member denying his positive alcohol use.    Summary: Pt was active and engaged in group. He reported he attended 1 AA meeting since last group. He shared openly about seeing a segment on TV about addiction and invited the group to view it when they got home since it helped him both break down stigma and inform him of the severity of alcoholism in [place]. Pt stated he enjoyed the individual session yesterday and benefited from "being shown a great deal of respect, more than I'm used to". Pt spoke on the topic of "Boundairies" admitted he "has a lot of them" but that he historically did not make boundaries a priority. Pt offered helpful and somewhat critical  feedback for new group member who was minimizing his ETOH use.   UDS collected: No Results: negative  AA/NA attended?: YesThursday  Sponsor?: Yes   Youlanda Roys, LPCA LCASA 02/14/2017 4:28 PM

## 2017-02-15 ENCOUNTER — Encounter (HOSPITAL_COMMUNITY): Payer: Self-pay | Admitting: Psychology

## 2017-02-15 NOTE — Progress Notes (Signed)
    Daily Group Progress Note  Program: CD-IOP   02/15/2017 Daniel [name] [id number]  Diagnosis:  No diagnosis found.   Sobriety Date: 08/04/16  Group Time: 1-2:30pm  Participation Level: Active  Behavioral Response: Appropriate and Sharing  Type of Therapy: Process Group  Interventions: Supportive  Topic: Process: the first half of group was spent in process. Members shared about the past weekend and any challenges or temptations they may have faced in early recovery. The medical director met with three group members during the session today. Drug tests were collected from four group members.  Group Time: 2:30-4pm  Participation Level: Active  Behavioral Response: Appropriate  Type of Therapy: Psycho-education Group  Interventions: Strength-based  Topic: Psycho-Ed: Guided Meditation/Bio-Psycho-Social-Spiritual: The second half of group began with a guided meditation. The counselor led the group through a guided progressive relaxation meditation. Members differed on the degree of their ability to follow the directions. Most group members were able to be present for part of the meditation, but many drifted off, which is to be expected. The session continued with the second half of the psycho-ed from last Wednesday. Members identified how they could enhance their sobriety and recovery through the three realms of psycho-social-spiritual elements.   Summary: The patient reported he had a good weekend. He attended the AA cookout held on Saturday afternoon. He had introduced himself to some of the folks there that he hadn't met and saw some parents he had first known when his son was in grade school. The patient held up the [place] and reported he had won this in the raffle during the picnic. The patient reported he and his wife start their day identifying something they are grateful for in their lives. "It is a wonderful way to begin the day", he explained and encouraged other group  members to try this. In the psycho-ed, the patient identified prayer and reading spiritual books as enhancing his spiritual and psychological self. He noted that attending the meetings has allowed him to meet and develop relationships with an entirely new group of people whom he would never have met in the old days. He expressed how much he enjoys group and how pleased he is to have gotten to know these people here. The patient reported he worries a little about his upcoming court date, but recognizes he must focus on the good things he is doing every day and that the decision of the court is out of his hands. Despite this, he still finds himself thinking about having a drink. The patient continues to make good progress in early recovery and responded well to this intervention.    UDS collected: Yes Results: pending  AA/NA attended?: [place], Friday and Sunday  Sponsor?: Yes   [name], LCAS 02/15/2017 11:10 AM

## 2017-02-16 ENCOUNTER — Other Ambulatory Visit (HOSPITAL_COMMUNITY): Payer: 59 | Admitting: Psychology

## 2017-02-16 DIAGNOSIS — F1020 Alcohol dependence, uncomplicated: Secondary | ICD-10-CM | POA: Diagnosis not present

## 2017-02-16 DIAGNOSIS — F4310 Post-traumatic stress disorder, unspecified: Secondary | ICD-10-CM

## 2017-02-16 DIAGNOSIS — Z62819 Personal history of unspecified abuse in childhood: Secondary | ICD-10-CM

## 2017-02-16 DIAGNOSIS — F1994 Other psychoactive substance use, unspecified with psychoactive substance-induced mood disorder: Secondary | ICD-10-CM

## 2017-02-17 ENCOUNTER — Telehealth (HOSPITAL_COMMUNITY): Payer: Self-pay | Admitting: Psychology

## 2017-02-17 ENCOUNTER — Encounter (HOSPITAL_COMMUNITY): Payer: Self-pay | Admitting: Psychology

## 2017-02-17 ENCOUNTER — Other Ambulatory Visit (HOSPITAL_COMMUNITY): Payer: 59
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
## 2017-02-21 ENCOUNTER — Encounter: Payer: Self-pay | Admitting: Psychiatry

## 2017-02-21 ENCOUNTER — Other Ambulatory Visit (HOSPITAL_COMMUNITY): Payer: 59 | Admitting: Psychology

## 2017-02-21 DIAGNOSIS — F1020 Alcohol dependence, uncomplicated: Secondary | ICD-10-CM | POA: Diagnosis not present

## 2017-02-23 ENCOUNTER — Other Ambulatory Visit (HOSPITAL_COMMUNITY): Payer: 59

## 2017-02-23 DIAGNOSIS — Z96649 Presence of unspecified artificial hip joint: Secondary | ICD-10-CM | POA: Insufficient documentation

## 2017-02-23 HISTORY — DX: Presence of unspecified artificial hip joint: Z96.649

## 2017-02-24 ENCOUNTER — Other Ambulatory Visit (HOSPITAL_COMMUNITY): Payer: 59 | Admitting: Psychology

## 2017-02-24 ENCOUNTER — Encounter (HOSPITAL_COMMUNITY): Payer: Self-pay | Admitting: Licensed Clinical Social Worker

## 2017-02-24 DIAGNOSIS — F1020 Alcohol dependence, uncomplicated: Secondary | ICD-10-CM

## 2017-02-24 DIAGNOSIS — F4310 Post-traumatic stress disorder, unspecified: Secondary | ICD-10-CM

## 2017-02-24 DIAGNOSIS — F1994 Other psychoactive substance use, unspecified with psychoactive substance-induced mood disorder: Secondary | ICD-10-CM

## 2017-02-25 ENCOUNTER — Encounter (HOSPITAL_COMMUNITY): Payer: Self-pay | Admitting: Licensed Clinical Social Worker

## 2017-02-25 ENCOUNTER — Encounter (HOSPITAL_COMMUNITY): Payer: Self-pay | Admitting: Psychology
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
## 2017-02-25 NOTE — Progress Notes (Signed)
CD-IOP INDIVIDUAL TX UPDATE SESSION  THERAPIST PROGRESS NOTE  Session Time: 4-5pm  Participation Level: Active  Behavioral Response: Neat and Well GroomedAlertEuthymic  Type of Therapy: Individual Therapy  Treatment Goals addressed: Diagnosis: SUD, MDD  Interventions: CBT  Summary: Daniel [name] is a 66 y.o. male who presents for individual session as part of his tx in [place]. Counselor and pt discuss pt's recent progress and tx plan update. Pt is compliant w/ all goals and there are no revisions to tx plan at this time. Pt signs tx plan update noting that this is discussed w/ counselor.  Counselor and pt discuss pt's ongoing progress in tx and his engagement and openness in group.  Pt admits he is very nervous about his upcoming court date which may end up in his serving jail time. Pt admits he feels that "come what may" and he "knows his HP will lead him no matter what". Pt denies seeing "any reason to drink again".   Suicidal/Homicidal: Nowithout intent/plan  Therapist Response: CBT, person-centered planning  Plan: Return again in 1 weeks.  Diagnosis:    ICD-10-CM   1. Alcohol use disorder, severe, dependence ([place]) F10.20   2. Substance induced mood disorder (HCC) F19.94   3. PTSD (post-traumatic stress disorder) F43.10        Archie Balboa, LCAS-A 02/25/2017

## 2017-02-27 ENCOUNTER — Other Ambulatory Visit: Payer: Self-pay | Admitting: Interventional Cardiology

## 2017-02-27 DIAGNOSIS — E785 Hyperlipidemia, unspecified: Secondary | ICD-10-CM

## 2017-02-28 ENCOUNTER — Other Ambulatory Visit (HOSPITAL_COMMUNITY): Payer: 59 | Admitting: Psychology

## 2017-02-28 DIAGNOSIS — F1020 Alcohol dependence, uncomplicated: Secondary | ICD-10-CM

## 2017-03-01 ENCOUNTER — Encounter (HOSPITAL_COMMUNITY): Payer: Self-pay | Admitting: Psychology

## 2017-03-01 NOTE — Progress Notes (Signed)
    Daily Group Progress Note  Program: CD-IOP   03/01/2017 Daniel [name] [id number]  Diagnosis:  Alcohol use disorder, severe, dependence ([name])  Substance induced mood disorder (HCC)  PTSD (post-traumatic stress disorder)   Sobriety Date: 3/28  Group Time: 1-2:30  Participation Level: Active  Behavioral Response: Appropriate and Sharing  Type of Therapy: Process Group  Interventions: CBT, Strength-based, Supportive and Reframing  Topic: Patients were active and engaged in group process session with an emphasis on discussing early recovery strategies, "shining moments" and "speedbumps". Some UDS results were returned and discussed among group members.      Group Time: 2:30-4  Participation Level: Active  Behavioral Response: Appropriate and Sharing  Type of Therapy: Psycho-education Group  Interventions: CBT, Solution Focused, Strength-based, Supportive and Reframing  Topic: Patients were active and engaged in group psychoeducation session. Counselors facilitated discussion that centered on recovery-related topics such as AA, feelings, 12 Steps, and sobriety. Pts shared openly, gave, and received feedback to each other.   Summary: Pt was active and engaged in group today. He shared that he missed group Wednesday due to a doctors appointment for his leg pain. He reported that the doctors told him the muscles were reattaching to his bone which is good but there was also going to be some pain associated w/ the process. Pt admitted "there is not much I can do for my pain and I need to accept it". He reported he attended 3 AA meetings since Monday. He talked to his sponsor twice. He stated that at his homegroup a new member joined and made pt feel very excited about his own recovery. Pt stated that he continues to work PT for his leg and experiences significant pain. Pt was open and sharing and provided helpful and supportive feedback. Pt shared about his upcoming courtdate  which will determine whether or not he has to serve jail time for his DUI charge.    UDS collected: Yes Results: negative  AA/NA attended?: YesMonday, Wednesday and Thursday  Sponsor?: Yes   Youlanda Roys, LPCA LCASA 03/01/2017 9:26 AM

## 2017-03-02 ENCOUNTER — Encounter (HOSPITAL_COMMUNITY): Payer: Self-pay | Admitting: Psychology

## 2017-03-02 ENCOUNTER — Other Ambulatory Visit (HOSPITAL_COMMUNITY): Payer: 59 | Admitting: Psychology

## 2017-03-02 DIAGNOSIS — F1020 Alcohol dependence, uncomplicated: Secondary | ICD-10-CM

## 2017-03-02 NOTE — Progress Notes (Signed)
Daily Group Progress Note  Program: CD-IOP   03/02/2017 Charlynne Cousins [id number]  Diagnosis:  No diagnosis found.   Sobriety Date: 3/28  Group Time: 1-2:30pm  Participation Level: Active  Behavioral Response: appropriate and sharing  Type of Therapy: process  Interventions: supportive  Topic: Process: The first half of group was spent in process. Members shared about their struggles and successes in early recovery. Two members met with the program director during the session. Four random drug tests were collected.   Group Time: 2:30-4pm  Participation Level: Active  Behavioral Response: Sharing  Type of Therapy: Psycho-Ed  Interventions: Strength-based  Topic: Psycho-Ed: Experiential Interactive Exercise: The second half of group was spent in a psycho-ed. Members broke up into couples and one member was given a drawing and the other a blank piece of paper. Sitting back to back, the member with the picture was to instruct their partner and explain how to draw their picture. The session was timed and at the end of the 10 minute exercise, a lively discussion ensued. The importance of communication was high-lighted and the ways to enhance communication identified.    Summary: The patient received an applause when he entered the group room. He had been in court yesterday facing a potential two years in prison. He reported that his court date had been continued and everyone present appeared relieved. The patient expressed his appreciation and gratitude to everyone here in the group and how much they have helped him in addressing his addiction. He spoke about the importance of prayer and how 'it really works'. In the psycho-ed, the patient reported he had found his reader very patient and that he had provided a lot of clarity in his descriptions. The patient presented as very relieved and grateful today. When he left the group on Monday, it was very unclear whether we would ever  see him again. The patient responded well to this intervention.    UDS collected: Yes Results:pending  AA/NA attended: Yes, Wednesday  Sponsor?: yes   [name], LCAS 03/02/2017 10:17 PM

## 2017-03-03 ENCOUNTER — Other Ambulatory Visit (HOSPITAL_COMMUNITY): Payer: 59 | Admitting: Psychology

## 2017-03-03 ENCOUNTER — Encounter (HOSPITAL_COMMUNITY): Payer: Self-pay | Admitting: Psychology

## 2017-03-03 DIAGNOSIS — F1020 Alcohol dependence, uncomplicated: Secondary | ICD-10-CM | POA: Diagnosis not present

## 2017-03-03 DIAGNOSIS — F1994 Other psychoactive substance use, unspecified with psychoactive substance-induced mood disorder: Secondary | ICD-10-CM

## 2017-03-03 NOTE — Progress Notes (Signed)
    Daily Group Progress Note  Program: CD-IOP   03/03/2017 Daniel [name] [id number]  Diagnosis:  No diagnosis found.   Sobriety Date: 3/28  Group Time: 1-2:30pm  Participation Level: active  Behavioral Response: appropriate  Type of Therapy: Process  Interventions: Support  Topic: Process: The first half of group was spent in process. Members shared about the past weekend and identified any challenges or triumphs in early recovery. The program director met with two group members during the session today. Four random drug tests were collected.   Group Time: 2:30-4pm  Participation Level: Active  Behavioral Response: sharing  Type of Therapy: Psycho-Ed  Interventions: Strength based  Topic: Psycho-Ed: Relapse Prevention: The second half of group was spent in a psycho-ed on Relapse Prevention. A handout was provided asking members to identify high, medium and no risk areas of their daily life. Members shared about what they had identified on the handout and where they must be vigilant if they are to remain drug-free. Members shared about their previous experiences and were very candid and revealing about themselves in their active addiction. The session ended with a graduation ceremony. One of the members was graduating successfully from the program and the graduation ceremony, including brownies and the medallion, were shared in this time. Two former group members, including the graduating [employer] sponsor, were present. Kind words of hope, respect and admiration were shared and the ceremony was a happy celebration of progress.    Summary: The patient reported he had had a number of shining moments, including having picked up his 6 month chip. The group applauded this news. The patient reported he had spoken with his sponsor and feel at peace as he approaches his court date tomorrow. The patient explained that he realized that he had done wrong and was ready to accept whatever  the court decided. It is possible that he will be sent to prison for 2 years, got to jail for 30 days, or be excused. He asked the group to pray for him. In the psycho-ed, the patient identified his high risk settings as being in bars, around old drinking buddies and the [facility] store. He was able to identify depression and weather that was dreary and dark as also serving as triggers. His no risk was being around family, in church or [employer]. The patient had a gift for the graduating member and shared very kind and hopeful words with her. This patient left the group today uncertain whether he would be returning on Wednesday. We will continue to follow closely in the days ahead.   UDS collected: No  AA/NA attended?: Yes, Friday, Monday  Sponsor?: Yes   [name], LCAS 03/03/2017 10:15 PM

## 2017-03-04 ENCOUNTER — Encounter (HOSPITAL_COMMUNITY): Payer: Self-pay | Admitting: Psychology

## 2017-03-04 NOTE — Progress Notes (Signed)
    Daily Group Progress Note  Program: CD-IOP   03/04/2017 Daniel [name] [id number]  Diagnosis:  Alcohol use disorder, severe, dependence ([place])  Substance induced mood disorder ([place])   Sobriety Date: 3/28  Group Time: 1-2:30  Participation Level: Active  Behavioral Response: Appropriate and Sharing  Type of Therapy: Process Group  Interventions: CBT, Strength-based, Supportive and Reframing  Topic: Patients were active and engaged for process session. Counselors led pts in discussing their feelings, thoughts, and behaviors related to recovery, including "shining moments" and "speedbumps" since yesterday. Pts discussed issues of panhandling, addiction, and guilt around helping others vs. choosing not to give money.     Group Time: 2:30-4  Participation Level: Active  Behavioral Response: Appropriate and Sharing  Type of Therapy: Psycho-education Group  Interventions: CBT and Assertiveness Training  Topic: Patients were active and engaged in psychoeducation session. Counselors led topic of communication. Pts were provided a handout and discussed 4 styles of communication including passive, aggressive, passive-aggressive, and assertive w/ an emphasis on being assertive when possible. Pts practiced role-plays that modeled assertive communication. Pts participated actively and reported that the exercise gave them a new insight into their own personal style of communication.   Summary: Pt was active and engaged in group. He reported he attended 1 AA meeting at his homegroup. He continues to provide baked goods for group regularly and encourages pts to try them. Pt continues to present as meticulously groomed and well dressed. He stated his highlight was running into a former group member in the lobby of the clinic and conversing w/ him. Pt reported he helped a new AA member but was ultimately "burned by it, since the guy asked for money right off the bat". Pt admitted he got  "short" w/ the man and regretted the way he spoke to him. This started a lengthy conversation on panhandling and how those in recovery should treat others who are obviously suffering w/ mental illness or addiction. Pt's communication skills appeared somewhat odd and pressured. Pt occasionally mixes his words and appears to struggle to get his point across. 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   UDS collected: No Results: negative  AA/NA attended?: YesThursday  Sponsor?: Yes   Youlanda Roys, LPCA LCASA 03/04/2017 11:56 AM

## 2017-03-07 ENCOUNTER — Other Ambulatory Visit (HOSPITAL_COMMUNITY): Payer: 59 | Admitting: Psychology

## 2017-03-07 ENCOUNTER — Encounter: Payer: Self-pay | Admitting: Psychiatry

## 2017-03-07 DIAGNOSIS — F1020 Alcohol dependence, uncomplicated: Secondary | ICD-10-CM | POA: Diagnosis not present

## 2017-03-08 ENCOUNTER — Encounter (HOSPITAL_COMMUNITY): Payer: Self-pay | Admitting: Psychology

## 2017-03-08 NOTE — Progress Notes (Signed)
    Daily Group Progress Note  Program: CD-IOP   03/08/2017 ARYE WEYENBERG [id number]  Diagnosis:  Alcohol use disorder, severe, dependence ([place])   Sobriety Date: 3/28  Group Time: 1-2:30  Participation Level: Active  Behavioral Response: Appropriate and Sharing  Type of Therapy: Process Group  Interventions: CBT, Strength-based, Supportive and Reframing  Topic: Patients were active and engaged in process group session. Some pts met w/ Darlyne Russian, PA for medication updates. Counselors administered a PHQ9 and GAD7 for all members. Pts shared about their challenges and successes, "speedbumps and shining moments" in recovery. Pts shared feedback and discussed their lives intimately. One member, who was presenting as "in denial" that his marriage was falling apart, was very open to feedback and shared intimately about his decision to sign separation paperwork. A guest pharmacist was present and observed group as part of her observation at [facility] for her residency. UDS results were returned to some pts.     Group Time: 2:30-4  Participation Level: Active  Behavioral Response: Appropriate and Sharing  Type of Therapy: Psycho-education Group  Interventions: CBT and Assertiveness Training  Topic: Patients were active and engaged in group psychoeducation session. Counselors provided pts w/ a handout on communication and "refusal skills". Pt practiced imagining scenarios in which they would be asked if they wanted to drink or drug. Pts were encouraged to practice their specific responses in anticipation of being asked to use in the future.    Summary: Pt was active and engaged in session. He reported he attended 2 AA meetings since last group. He stated he continues to talk to his sponsor regularly. UDS results from latest test were returned and were negative on all accounts. Pt discussed a conflict that occurred w/ his wife in which she criticized him for "milking the  injury". Pt was vague about the details of what happened but was clear that he "really felt triggered and almost wanted to lose it on her by getting upset". Pt instead called his sponsor and went on a walk. It is unclear whether pt intended to go buy alcohol during this walk. Pt was encouraged by other members to tell his wife to go to Al-Anon meetings to better process her feelings towards him.    UDS collected: No Results: negative  AA/NA attended?: YesSaturday and Sunday  Sponsor?: Yes   Youlanda Roys, LPCA LCASA 03/08/2017 9:14 AM

## 2017-03-09 ENCOUNTER — Other Ambulatory Visit (HOSPITAL_COMMUNITY): Payer: 59 | Admitting: Psychology

## 2017-03-09 DIAGNOSIS — F1994 Other psychoactive substance use, unspecified with psychoactive substance-induced mood disorder: Secondary | ICD-10-CM

## 2017-03-09 DIAGNOSIS — F1020 Alcohol dependence, uncomplicated: Secondary | ICD-10-CM

## 2017-03-09 DIAGNOSIS — Z62819 Personal history of unspecified abuse in childhood: Secondary | ICD-10-CM

## 2017-03-10 ENCOUNTER — Other Ambulatory Visit (HOSPITAL_COMMUNITY): Payer: 59

## 2017-03-10 ENCOUNTER — Encounter (HOSPITAL_COMMUNITY): Payer: Self-pay | Admitting: Psychology
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
## 2017-03-14 ENCOUNTER — Other Ambulatory Visit (HOSPITAL_COMMUNITY): Payer: 59 | Attending: Psychiatry | Admitting: Psychology

## 2017-03-14 DIAGNOSIS — F1994 Other psychoactive substance use, unspecified with psychoactive substance-induced mood disorder: Secondary | ICD-10-CM

## 2017-03-14 DIAGNOSIS — F1020 Alcohol dependence, uncomplicated: Secondary | ICD-10-CM | POA: Insufficient documentation

## 2017-03-15 ENCOUNTER — Encounter (HOSPITAL_COMMUNITY): Payer: Self-pay | Admitting: Psychology

## 2017-03-15 NOTE — Progress Notes (Signed)
    Daily Group Progress Note  Program: CD-IOP   03/15/2017 BUD KAESER [id number]  Diagnosis:  Alcohol use disorder, severe, dependence ([place])  Substance induced mood disorder ([place])   Sobriety Date: 3/28  Group Time: 1-2:30  Participation Level: Active  Behavioral Response: Appropriate and Sharing  Type of Therapy: Process Group  Interventions: CBT, Strength-based and Supportive  Topic: Patients were active and engaged in process session. Counselors led pts in discussing their challenges and successes in recovery. Some pts were administered UDS. Some pts met w/ Darlyne Russian for medication management. One pt discussed her upcoming endoscopy and feelings of fear it was causing.      Group Time: 2:30-4  Participation Level: Active  Behavioral Response: Appropriate and Sharing  Type of Therapy: Psycho-education Group  Interventions: Meditation: Chair Yoga  Topic: Patients were active and engaged in psychoeducation session. Counselors led a 1 hr chair yoga activity inviting pts to utilize yoga to improve mood, functionality, and reduce stress. Session was led by guest counselor and certified yoga for therapy instructor Jan Fireman, [place].   Summary: Pt was active and engaged in session. He reported he missed the group last session due to going to the beach. He admitted he "felt great to tell his friends he was not drinking bc he was in recovery as an alcoholic". Pt reported he felt strengthened by his wife who was also with him. Pt was engaged in chair yoga and participated actively despite having pain in his left side.   UDS collected: No Results: negative  AA/NA attended?: YesSaturday and Sunday  Sponsor?: Yes  Youlanda Roys, LPCA LCASA  03/15/2017 6:56 PM

## 2017-03-16 ENCOUNTER — Other Ambulatory Visit (HOSPITAL_COMMUNITY): Payer: 59 | Admitting: Psychology

## 2017-03-16 DIAGNOSIS — F1020 Alcohol dependence, uncomplicated: Secondary | ICD-10-CM | POA: Diagnosis not present

## 2017-03-16 DIAGNOSIS — F4312 Post-traumatic stress disorder, chronic: Secondary | ICD-10-CM

## 2017-03-17 ENCOUNTER — Encounter (HOSPITAL_COMMUNITY): Payer: Self-pay | Admitting: Psychology

## 2017-03-17 ENCOUNTER — Other Ambulatory Visit (HOSPITAL_COMMUNITY): Payer: 59 | Admitting: Psychology

## 2017-03-17 DIAGNOSIS — F1020 Alcohol dependence, uncomplicated: Secondary | ICD-10-CM
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
## 2017-03-21 ENCOUNTER — Encounter (HOSPITAL_COMMUNITY): Payer: Self-pay | Admitting: Psychology

## 2017-03-21 ENCOUNTER — Other Ambulatory Visit (HOSPITAL_COMMUNITY): Payer: 59 | Admitting: Psychology

## 2017-03-21 DIAGNOSIS — F1020 Alcohol dependence, uncomplicated: Secondary | ICD-10-CM | POA: Diagnosis not present

## 2017-03-21 DIAGNOSIS — F4312 Post-traumatic stress disorder, chronic: Secondary | ICD-10-CM

## 2017-03-21 DIAGNOSIS — F1994 Other psychoactive substance use, unspecified with psychoactive substance-induced mood disorder: Secondary | ICD-10-CM

## 2017-03-21 NOTE — Progress Notes (Signed)
    Daily Group Progress Note  Program: CD-IOP   03/21/2017 Daniel [name] [id number]  Diagnosis:  No diagnosis found.   Sobriety Date: 3/28  Group Time: 1-2:30pm  Participation Level: Active  Behavioral Response: Appropriate  Type of Therapy: Process Group  Interventions: Supportive  Topic: Process: The first half of group was spent in process. Members shared about their experiences in early recovery, identifying speed bumps and shining moments. A new group member was present and he introduced himself during this half of group. One drug test was collected while previously collected test results returned.   Group Time: 2:30-4pm  Participation Level: Active  Behavioral Response: Appropriate  Type of Therapy: Psycho-education Group  Interventions: Strength-based  Topic: Psycho-Ed: From unhealthy thoughts to relapse prevention. The second half of group was spent in psycho-ed. members began to share the topic on their popsicle stick. The first word or phrase to be addressed in recovery was 'unhealthy thoughts'. The member seemed confused at first, but was eventually able to identify specific things he might to address these unhealthy thoughts. This generated a spontaneous review of the four steps that result in relapse: Trigger, thought, craving, use. This four-step process was discussed at length with all members asked to identify how they will address thoughts, change them and avoid moving into the craving or urge aspect? This generated a lively discussion and the session ended with members displaying a renewed intention around avoiding relapse.   Summary: The patient reported he had chaired the [employer] yesterday and a young man who was obviously struggling had appeared in the meeting. The group at the meeting had welcomed this fellow and provided encouragement and support. The patient was pleased that the fellow group returned this morning. The patient agreed with another  member about struggling when the weather is cold. The rainy weather is a trigger and he admitted, "it bothers me being by myself". In the psycho-ed, the patient identified calling any number of the men and women he has met in early recovery and his willingness to do this. "I call somebody almost every day", he stated. Prior the session ending, the patient encouraged his fellow group members to call him and a sheet was passed around for the newest group member. The patient as validating and supportive to his fellow group members and continues to make measurable progress in early recovery. He responded well to this intervention.   UDS collected: no  AA/NA attended?: YesThursday  Sponsor?: Yes   [name], LCAS 03/21/2017 9:19 AM

## 2017-03-23 ENCOUNTER — Other Ambulatory Visit (HOSPITAL_COMMUNITY): Payer: 59 | Admitting: Psychology

## 2017-03-23 ENCOUNTER — Encounter (HOSPITAL_COMMUNITY): Payer: Self-pay | Admitting: Psychology

## 2017-03-23 DIAGNOSIS — F4312 Post-traumatic stress disorder, chronic: Secondary | ICD-10-CM

## 2017-03-23 DIAGNOSIS — F1020 Alcohol dependence, uncomplicated: Secondary | ICD-10-CM

## 2017-03-23 NOTE — Progress Notes (Signed)
    Daily Group Progress Note  Program: CD-IOP   03/23/2017 Daniel [name] [id number]  Diagnosis:  Alcohol use disorder, severe, dependence (HCC)  Chronic post-traumatic stress disorder (PTSD)  Substance induced mood disorder ([place])   Sobriety Date: 3/28  Group Time: 1-2:30  Participation Level: Active  Behavioral Response: Appropriate and Sharing  Type of Therapy: Process Group  Interventions: CBT  Topic: Counselors led a patient centered process session designed to elicit pt's change talk, discuss their active recovery from mind-altering drugs and alcohol, and help motivate pts to sustain changes. Pts were encouraged to continue meeting their goals of attending AA/NA and using CBT based interventions to prevent relapse and tolerate distressing emotions.     Group Time: 2:30-4  Participation Level: Active  Behavioral Response: Appropriate and Sharing  Type of Therapy: Psycho-education Group  Interventions: CBT  Topic: Counselors led a psychoeducation session aimed at increasing pt's understanding, and insight into their personal values. Pts did a "values sort" ranking their top 10 personal values. Pts discussed the process of sorting and choosing their values w/ each other. Pts appeared engaged and interested.    Summary: Pt was active and engaged in session reporting he attended 2 AA meetings over the weekend. Pt stated his weekend was "very good" and he was able to practice his recovery and "will power" by resisting the urge to drink a bottle of whiskey that he accidentally found in his garage while he was home alone. Pt stated he "quickly called his wife home" to help him pour the whiskey out. The group congratulated him for his quick, decisive action. Counselor reflected that he "did more than will power" since he put into practice skills he had learned in tx, such as calling someone and telling "on himself".   Pt identified "commitment" as his number one  value.   UDS collected: Yes Results: negative  AA/NA attended?: YesSaturday and Sunday  Sponsor?: Yes   Youlanda Roys, LPCA LCASA 03/23/2017 10:13 AM

## 2017-03-24 ENCOUNTER — Other Ambulatory Visit (HOSPITAL_BASED_OUTPATIENT_CLINIC_OR_DEPARTMENT_OTHER): Payer: 59 | Admitting: Psychology

## 2017-03-24 DIAGNOSIS — F1020 Alcohol dependence, uncomplicated: Secondary | ICD-10-CM

## 2017-03-24 DIAGNOSIS — F1994 Other psychoactive substance use, unspecified with psychoactive substance-induced mood disorder: Secondary | ICD-10-CM

## 2017-03-28 ENCOUNTER — Other Ambulatory Visit (HOSPITAL_COMMUNITY): Payer: Self-pay | Admitting: Medical

## 2017-03-28 ENCOUNTER — Encounter (HOSPITAL_COMMUNITY): Payer: Self-pay | Admitting: Medical

## 2017-03-28 ENCOUNTER — Encounter (HOSPITAL_COMMUNITY): Payer: Self-pay | Admitting: Psychology

## 2017-03-28 ENCOUNTER — Other Ambulatory Visit (INDEPENDENT_AMBULATORY_CARE_PROVIDER_SITE_OTHER): Payer: 59 | Admitting: Psychology

## 2017-03-28 DIAGNOSIS — I10 Essential (primary) hypertension: Secondary | ICD-10-CM

## 2017-03-28 DIAGNOSIS — Z9889 Other specified postprocedural states: Secondary | ICD-10-CM

## 2017-03-28 DIAGNOSIS — F418 Other specified anxiety disorders: Secondary | ICD-10-CM

## 2017-03-28 DIAGNOSIS — F1020 Alcohol dependence, uncomplicated: Secondary | ICD-10-CM | POA: Diagnosis not present

## 2017-03-28 DIAGNOSIS — Z9861 Coronary angioplasty status: Secondary | ICD-10-CM

## 2017-03-28 DIAGNOSIS — F4310 Post-traumatic stress disorder, unspecified: Secondary | ICD-10-CM

## 2017-03-28 DIAGNOSIS — I200 Unstable angina: Secondary | ICD-10-CM

## 2017-03-28 DIAGNOSIS — Z7409 Other reduced mobility: Secondary | ICD-10-CM

## 2017-03-28 DIAGNOSIS — F17210 Nicotine dependence, cigarettes, uncomplicated: Secondary | ICD-10-CM

## 2017-03-28 DIAGNOSIS — Z62819 Personal history of unspecified abuse in childhood: Secondary | ICD-10-CM

## 2017-03-28 DIAGNOSIS — E785 Hyperlipidemia, unspecified: Secondary | ICD-10-CM

## 2017-03-28 DIAGNOSIS — I2510 Atherosclerotic heart disease of native coronary artery without angina pectoris: Secondary | ICD-10-CM

## 2017-03-28 DIAGNOSIS — F1994 Other psychoactive substance use, unspecified with psychoactive substance-induced mood disorder: Secondary | ICD-10-CM

## 2017-03-28 DIAGNOSIS — Z6372 Alcoholism and drug addiction in family: Secondary | ICD-10-CM

## 2017-03-28 NOTE — Progress Notes (Signed)
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  --More than half of that time was spent in a face to face manner in the examination room, coordinating the patient's orthopedic care, and counseling and educating the patient and family. Improving gait some incisional pain persists  --Together we reviewed the pertinent physical findings on today's exam: walks well independently  --Also we reviewed clinical information from radiographic studies: X-Ray: well positioned located THA with constraining ring intact  --We discussed the significance of the xray findings and rehab effort  --Based on this information I communicated my impression that he may continue WBAT with hip precautions  --Together we agreed to proceed with xray 1 year  Electronically signed by: Marygrace Drought, MD 02/23/17 [phone]

## 2017-03-28 NOTE — Progress Notes (Signed)
    Daily Group Progress Note  Program: CD-IOP   03/28/2017 Daniel [name] [id number]  Diagnosis:  No diagnosis found.   Sobriety Date: 3/28  Group Time: 1-2:30pm  Participation Level: Active  Behavioral Response: Appropriate  Type of Therapy: Process Group  Interventions: Supportive  Topic: Process: The first half of group was spent in process. Members shared about their shining moments and any speed bumps they had experienced since we last met. A new group member was present, and she introduced herself and told the group about her struggles with alcoholism. The medical director met with two group members and three drug tests were collected today.  Group Time: 2:30-4pm  Participation Level: Active  Behavioral Response: Appropriate  Type of Therapy: Mindfulness Exercise/Psycho-education  Interventions: Strength-based  Topic: Mindfulness/Psycho-ed; Values, part 2: the second half of group began with a five-minute mindfulness exercise. It entailed breathing in and saying, 'I am breathing in' and breathing out and saying, "I have breathing out". Members had various responses at the conclusion of the exercise. The remainder of the session was spent in a psycho-ed. The topic was 'Values' and was a second part of the session begun on Monday. Members were asked to identify how they can direct their behaviors to line up with their values. There was good feedback and discussion among the group.  Summary: The patient reported his speed bump turned out to be his shining moment. He reported that yesterday, he had accompanied his wife to her appointment at her PC. Her blood pressure was 'off the chart' and very dangerous to contribute to a stoke. Her PCP had reprimanded her, but and not prescribed any medication. Her psychiatrist appointment here at [facility] later in the day had found the same markedly high BP, but in this instance, he prescribed a medication. He also recommended a PCP for  future reference and he had scheduled an appointment with her for next week. The patient reported it had gone from bad to great and he was very grateful. The patient asked the group about step 7 and wondered what the term meant by 'shortcomings? The patient was encouraged to work step four and onward and by the time he arrived at step 7, "you will know your shortcomings". The group laughed, but it seemed clear to the patient that he didn't have to worry about that any longer. The. patient expressed frustration with the legal system and the way they ostracize and demean those with chemical dependency. In the meditation, the patient admitted he had little problem with distractions, because, "I can't hear anything out of my left ear" and his right ear feels like it is plugged up. 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 However, "alcohol became my downfall". 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 But how, in the end, his drinking had eventually destroyed his opportunities in the industry. he provided helpful and supportive feedback to his fellow group members and responded well to this intervention.    UDS collected: Yes, negative  AA/NA attended?: No  Sponsor?: Yes   [name], LCAS 03/28/2017 8:25 AM
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
## 2017-03-29 ENCOUNTER — Encounter (HOSPITAL_COMMUNITY): Payer: Self-pay | Admitting: Psychology

## 2017-03-29 NOTE — Progress Notes (Signed)
    Daily Group Progress Note  Program: CD-IOP   03/29/2017 Daniel [name] [id number]  Diagnosis:  Alcohol use disorder, severe, dependence ([place])  Substance induced mood disorder ([place])   Sobriety Date: 3/28  Group Time: 1-2:30  Participation Level: Active  Behavioral Response: Appropriate and Sharing  Type of Therapy: Process Group  Interventions: CBT, Strength-based and Supportive  Topic: Pts were active and engaged in process session in which pts were encouraged to share about their recovery from mind-altering drugs and alcohol. Pts were encouraged to consider their tx goals of sobriety, social support, and any person-centered goals when checking in.      Group Time: 2:30-4  Participation Level: Active  Behavioral Response: Appropriate and Sharing  Type of Therapy: Psycho-education Group  Interventions: CBT and Other: Self Care, Diet, SLeep, Exercise  Topic: Pts were active and engaged in psychoeducational session with an emphasis on "sleep, diet, exercise, and general self care strategies". Tamera Reason from [facility], led a 1 hour powerpoint presentation on the subject. Pts asked multiple questions and shared openly about their struggles and successes in self care.    Summary: Pt was active and engaged in session. He reported he attended 1 AA meeting since yesterday. He reported his anxiety was somewhat more elevated than previous sessions, but he stated that he felt capable of "keeping it at [place]". He verbalized understanding of multiple techniques including breathing and calling his sponsor to help alleviate his symptoms.   UDS collected: No Results: negative  AA/NA attended?: YesThursday  Sponsor?: Yes   Wes Swan, LPCA LCASA 03/29/2017 3:00 PM

## 2017-03-30 ENCOUNTER — Other Ambulatory Visit (HOSPITAL_COMMUNITY): Payer: 59 | Admitting: Psychology

## 2017-03-30 DIAGNOSIS — F1020 Alcohol dependence, uncomplicated: Secondary | ICD-10-CM
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
## 2017-04-04 ENCOUNTER — Encounter (HOSPITAL_COMMUNITY): Payer: Self-pay | Admitting: Psychology

## 2017-04-04 ENCOUNTER — Other Ambulatory Visit (HOSPITAL_COMMUNITY): Payer: 59
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
## 2017-04-06 ENCOUNTER — Ambulatory Visit (HOSPITAL_COMMUNITY): Payer: Self-pay | Admitting: Licensed Clinical Social Worker

## 2017-04-06 ENCOUNTER — Other Ambulatory Visit (HOSPITAL_COMMUNITY): Payer: 59

## 2017-04-07 ENCOUNTER — Other Ambulatory Visit (HOSPITAL_COMMUNITY): Payer: 59

## 2017-04-11 ENCOUNTER — Other Ambulatory Visit (HOSPITAL_COMMUNITY): Payer: 59

## 2017-04-13 ENCOUNTER — Ambulatory Visit (HOSPITAL_COMMUNITY): Payer: Self-pay | Admitting: Licensed Clinical Social Worker

## 2017-04-13 ENCOUNTER — Other Ambulatory Visit (HOSPITAL_COMMUNITY): Payer: 59

## 2017-04-14 ENCOUNTER — Other Ambulatory Visit (HOSPITAL_COMMUNITY): Payer: 59

## 2017-04-18 ENCOUNTER — Other Ambulatory Visit (HOSPITAL_COMMUNITY): Payer: 59

## 2017-04-20 ENCOUNTER — Ambulatory Visit (INDEPENDENT_AMBULATORY_CARE_PROVIDER_SITE_OTHER): Payer: 59 | Admitting: Licensed Clinical Social Worker

## 2017-04-20 DIAGNOSIS — F1994 Other psychoactive substance use, unspecified with psychoactive substance-induced mood disorder: Secondary | ICD-10-CM | POA: Diagnosis not present

## 2017-04-20 DIAGNOSIS — F1020 Alcohol dependence, uncomplicated: Secondary | ICD-10-CM

## 2017-04-22 ENCOUNTER — Encounter (HOSPITAL_COMMUNITY): Payer: Self-pay | Admitting: Licensed Clinical Social Worker

## 2017-04-22 NOTE — Progress Notes (Signed)
  Weekly Group Progress Note  Program: OUTPATIENT SKILLS GROUP  Group Time: 5:30-6:30pm  Participation Level: Active  Behavioral Response: Appropriate and Sharing  Type of Therapy:  Psycho-education Group  Skills discussed: Meditation   Summary of Progress: Pt was active and engaged for his first Aftercare group following his recent d/c from CD-IOP. Pt continues to deny drinking and continues to report attending [place] meetings and working w/ his sponsor. He was upbeat and openly shared during group. He reported he was a 8 on a 10 point scale w/ 10 being "feeling most positive".   Summary of Group: Counselor led pts in brief process session in which pts discussed their previous week and how their SUD, Anxiety, and depression had impacted them. Counselor showed brief video clip on mindfulness and led pts in a 15 min exercise of mindful breathing and increasing gratitude.  Archie Balboa, LPCA, LCASA

## 2017-04-27 ENCOUNTER — Encounter (HOSPITAL_COMMUNITY): Payer: Self-pay | Admitting: Licensed Clinical Social Worker

## 2017-04-27 ENCOUNTER — Ambulatory Visit (INDEPENDENT_AMBULATORY_CARE_PROVIDER_SITE_OTHER): Payer: 59 | Admitting: Licensed Clinical Social Worker

## 2017-04-27 DIAGNOSIS — F1021 Alcohol dependence, in remission: Secondary | ICD-10-CM | POA: Diagnosis not present

## 2017-04-27 DIAGNOSIS — F418 Other specified anxiety disorders: Secondary | ICD-10-CM | POA: Diagnosis not present

## 2017-04-27 NOTE — Progress Notes (Signed)
  Weekly Group Progress Note  Program: OUTPATIENT SKILLS GROUP  Group Time: 5:30-6:30pm  Participation Level: Active  Behavioral Response: Appropriate and Sharing  Type of Therapy:  Psycho-education Group  Skills discussed: Stage manager   Summary of Progress: Pt continues to sustain sobriety, attend AA regularly at his home group, and work w/ his sponsor. Pt reported he was a 10 on a 10 point scale with 10 being "most positive". Pt stated he was happy his son graduated from college today. Pt stated he got very frustrated at his lawyer who advised him of an option to return to drinking in order to get into residential tx and avoid any jail time for his upcoming court date.   Summary of Group: Pts were active and engaged in brief process session in which pts shared about their weeks and utilization of coping skills.  Pts were active and engaged in significant psychoeducation session in which counselor led pts through a handout on "cognitive distortions"   Archie Balboa, LPCA, [place]

## 2017-05-04 ENCOUNTER — Ambulatory Visit (HOSPITAL_COMMUNITY): Payer: Self-pay | Admitting: Licensed Clinical Social Worker

## 2017-05-11 ENCOUNTER — Ambulatory Visit (INDEPENDENT_AMBULATORY_CARE_PROVIDER_SITE_OTHER): Payer: 59 | Admitting: Licensed Clinical Social Worker

## 2017-05-11 DIAGNOSIS — F1021 Alcohol dependence, in remission: Secondary | ICD-10-CM | POA: Diagnosis not present

## 2017-05-13 ENCOUNTER — Encounter (HOSPITAL_COMMUNITY): Payer: Self-pay | Admitting: Licensed Clinical Social Worker

## 2017-05-13 NOTE — Progress Notes (Signed)
   THERAPIST PROGRESS NOTE  Session Time: 5:30-6:30pm  Participation Level: Active  Behavioral Response: Neat and Well GroomedAlertEuthymic  Type of Therapy: Individual Therapy  Treatment Goals addressed: Coping  Interventions: CBT  Summary: Daniel [name] is a 67 y.o. male who presents for individual therapy to address his recovery from alcoholism. Pt reports same sobriety date. Pt updates counselor on his recent decision to serve the expected 30 days in jail for his past traffic and alcohol violations. Pt states he has "gotten peace about it and is actively talking to his hp about how to proceed". Pt reports he continues to attend AA, meets regularly w/ his sponsor, and states he and his wife are getting along better than they have in decades.   Suicidal/Homicidal: Nowithout intent/plan  Therapist Response: Counselor used open questions and reframing to help pt identify his negative cognitions and how these contribute to his anxiety. Counselor help pt process his feelings towards his upcoming jail time. Pt displays strong sense of guilt and admits he "doesn't know if he'll ever get rid of it". Counselor worked w/ pts strong Christian values to help him make sense of his past transgressions.  Plan: Return again in 2 weeks.  Diagnosis:    ICD-10-CM   1. Alcohol use disorder, severe, in early remission [facility]) F10.21        Archie Balboa, LCAS-A 05/13/2017

## 2017-05-18 ENCOUNTER — Ambulatory Visit (HOSPITAL_COMMUNITY): Payer: Self-pay | Admitting: Licensed Clinical Social Worker

## 2017-05-25 ENCOUNTER — Ambulatory Visit (INDEPENDENT_AMBULATORY_CARE_PROVIDER_SITE_OTHER): Payer: 59 | Admitting: Licensed Clinical Social Worker

## 2017-05-25 ENCOUNTER — Ambulatory Visit (HOSPITAL_COMMUNITY): Payer: Self-pay | Admitting: Licensed Clinical Social Worker

## 2017-05-25 DIAGNOSIS — F1021 Alcohol dependence, in remission: Secondary | ICD-10-CM

## 2017-05-25 DIAGNOSIS — F418 Other specified anxiety disorders: Secondary | ICD-10-CM

## 2017-05-27 ENCOUNTER — Encounter (HOSPITAL_COMMUNITY): Payer: Self-pay | Admitting: Licensed Clinical Social Worker

## 2017-05-27 NOTE — Progress Notes (Signed)
  Weekly Group Progress Note  Program: OUTPATIENT SKILLS GROUP  Group Time: 5:30-6:30pm  Participation Level: Active  Behavioral Response: Appropriate and Sharing  Type of Therapy:  Psycho-education Group  Skills discussed: Self identity, mindfulness   Summary of Progress: Pt continues to make good progress in his recovery from alcohol addiction. He states he is active in [place] and working w/ his sponsor. Pt was warm and welcoming to new group members. He reported he is an 8 on a 10point scale w/ 10 being most positive.   Summary of Group: Counselor engaged pts in group psychoeducation on self identity, mindfulness, and recovery for depression, anxiety, and addiction. Counselor assisted 2 new members in introducing themselves and identifying how the group could help them meet their goals.     Archie Balboa, LPCA, LCASA

## 2017-06-01 ENCOUNTER — Ambulatory Visit (INDEPENDENT_AMBULATORY_CARE_PROVIDER_SITE_OTHER): Payer: 59 | Admitting: Licensed Clinical Social Worker

## 2017-06-01 DIAGNOSIS — F1021 Alcohol dependence, in remission: Secondary | ICD-10-CM

## 2017-06-01 DIAGNOSIS — F418 Other specified anxiety disorders: Secondary | ICD-10-CM

## 2017-06-06 ENCOUNTER — Encounter (HOSPITAL_COMMUNITY): Payer: Self-pay | Admitting: Licensed Clinical Social Worker

## 2017-06-06 NOTE — Progress Notes (Signed)
  Weekly Group Progress Note  Program: OUTPATIENT SKILLS GROUP  Group Time: 5:30-6:30pm  Participation Level: Active  Behavioral Response: Appropriate and Sharing  Type of Therapy:  Psycho-education Group  Skills discussed: Challenging cognitive Myths, DBT   Summary of Progress: Pt was engaged and open in session. He continues to deny any concerns for his sobriety and attends AA regularly throughout the week. Pt is supportive towards new members and discusses his techniques for staying sober.  Summary of Group:  Pts were active and engaged for group discussion of individual challenges and tx goals. One new pt was present following her successful d/c from CD-IOP. Pts filled out a DBT handout on "challenging myths" and learning the language of disputing irrational beliefs.   Archie Balboa, LPCA, LCASA

## 2017-06-08 ENCOUNTER — Ambulatory Visit (HOSPITAL_COMMUNITY): Payer: Self-pay | Admitting: Licensed Clinical Social Worker

## 2017-06-14 DIAGNOSIS — M79673 Pain in unspecified foot: Secondary | ICD-10-CM | POA: Diagnosis not present

## 2017-06-14 DIAGNOSIS — F324 Major depressive disorder, single episode, in partial remission: Secondary | ICD-10-CM | POA: Diagnosis not present

## 2017-06-14 DIAGNOSIS — F17200 Nicotine dependence, unspecified, uncomplicated: Secondary | ICD-10-CM | POA: Diagnosis not present

## 2017-06-14 DIAGNOSIS — R350 Frequency of micturition: Secondary | ICD-10-CM | POA: Diagnosis not present

## 2017-06-14 DIAGNOSIS — I69354 Hemiplegia and hemiparesis following cerebral infarction affecting left non-dominant side: Secondary | ICD-10-CM | POA: Diagnosis not present

## 2017-06-15 ENCOUNTER — Ambulatory Visit (HOSPITAL_COMMUNITY): Payer: Self-pay | Admitting: Licensed Clinical Social Worker

## 2017-06-22 ENCOUNTER — Ambulatory Visit (HOSPITAL_COMMUNITY): Payer: Self-pay | Admitting: Licensed Clinical Social Worker

## 2017-06-22 ENCOUNTER — Ambulatory Visit (INDEPENDENT_AMBULATORY_CARE_PROVIDER_SITE_OTHER): Payer: 59 | Admitting: Licensed Clinical Social Worker

## 2017-06-22 DIAGNOSIS — F1021 Alcohol dependence, in remission: Secondary | ICD-10-CM | POA: Diagnosis not present

## 2017-06-22 DIAGNOSIS — F418 Other specified anxiety disorders: Secondary | ICD-10-CM | POA: Diagnosis not present

## 2017-06-24 ENCOUNTER — Encounter (HOSPITAL_COMMUNITY): Payer: Self-pay | Admitting: Licensed Clinical Social Worker

## 2017-06-24 NOTE — Progress Notes (Signed)
  Weekly Group Progress Note  Program: OUTPATIENT SKILLS GROUP  Group Time: 5:30-6:30pm  Participation Level: Minimal  Behavioral Response: Appropriate, Rationalizing, Resistant, Agitated and Minimizing  Type of Therapy:  Psycho-education Group  Skills discussed: Mindfulness, concentration   Summary of Progress: Pt was active and mildly engaged in group. He presented as somewhat tearful, agitated and distressed due to his court date today that "did not go as planned". He reports his case was continued and this increases his anxiety about his sentencing. Pt presented as trying to be cheerful though he was clearly anxious and had difficulty expressing his disappointments.  Summary of Group: Patients were active and engaged in psychoeducation skill building group designed to help sustain recovery from anxiety, depression, and substance addiction. Pts were asked to rate their current feeling state on a 1-10 scale. Pts were led in a mindfulness exercise to help build focus and concentration. Counselor discussed pt's experience of meditation and how it could be applied to their unique recoveries.   Daniel [name], LPCA, LCASA

## 2017-06-28 ENCOUNTER — Encounter: Payer: Self-pay | Admitting: Interventional Cardiology

## 2017-06-28 ENCOUNTER — Ambulatory Visit (INDEPENDENT_AMBULATORY_CARE_PROVIDER_SITE_OTHER): Payer: 59 | Admitting: Interventional Cardiology

## 2017-06-28 VITALS — BP 130/80 | HR 58 | Ht 71.0 in | Wt 193.0 lb

## 2017-06-28 DIAGNOSIS — Z720 Tobacco use: Secondary | ICD-10-CM

## 2017-06-28 DIAGNOSIS — I2510 Atherosclerotic heart disease of native coronary artery without angina pectoris: Secondary | ICD-10-CM | POA: Diagnosis not present

## 2017-06-28 DIAGNOSIS — I4891 Unspecified atrial fibrillation: Secondary | ICD-10-CM | POA: Diagnosis not present

## 2017-06-28 DIAGNOSIS — E785 Hyperlipidemia, unspecified: Secondary | ICD-10-CM

## 2017-06-28 MED ORDER — ATORVASTATIN CALCIUM 40 MG PO TABS: ORAL_TABLET | ORAL | 3 refills | Status: DC

## 2017-06-28 MED ORDER — METOPROLOL SUCCINATE ER 25 MG PO TB24: 25.0000 mg | ORAL_TABLET | Freq: Every day | ORAL | 3 refills | Status: DC

## 2017-06-28 MED ORDER — LISINOPRIL 20 MG PO TABS: 20.0000 mg | ORAL_TABLET | Freq: Every day | ORAL | 3 refills | Status: DC

## 2017-06-28 MED ORDER — NITROGLYCERIN 0.4 MG SL SUBL: 0.4000 mg | SUBLINGUAL_TABLET | SUBLINGUAL | 3 refills | Status: DC | PRN

## 2017-06-28 MED ORDER — CLOPIDOGREL BISULFATE 75 MG PO TABS: 75.0000 mg | ORAL_TABLET | Freq: Every day | ORAL | 3 refills | Status: DC
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
## 2017-06-28 NOTE — Patient Instructions (Signed)

## 2017-06-29 ENCOUNTER — Ambulatory Visit (HOSPITAL_COMMUNITY): Payer: Self-pay | Admitting: Licensed Clinical Social Worker

## 2017-07-06 ENCOUNTER — Ambulatory Visit (HOSPITAL_COMMUNITY): Payer: Self-pay | Admitting: Licensed Clinical Social Worker

## 2017-07-13 ENCOUNTER — Ambulatory Visit (INDEPENDENT_AMBULATORY_CARE_PROVIDER_SITE_OTHER): Payer: 59 | Admitting: Licensed Clinical Social Worker

## 2017-07-13 DIAGNOSIS — F418 Other specified anxiety disorders: Secondary | ICD-10-CM

## 2017-07-13 DIAGNOSIS — F1021 Alcohol dependence, in remission: Secondary | ICD-10-CM

## 2017-07-15 ENCOUNTER — Encounter (HOSPITAL_COMMUNITY): Payer: Self-pay | Admitting: Licensed Clinical Social Worker

## 2017-07-15 NOTE — Progress Notes (Signed)
  Weekly Group Progress Note  Program: OUTPATIENT SKILLS GROUP  Group Time: 5:30-6:30pm  Participation Level: Active  Behavioral Response: Appropriate and Sharing  Type of Therapy:  Psycho-education Group  Skills discussed: Honesty, Self Talk    Summary of Progress: Pt continues to sustain his recovery from mind-altering drugs/alcohol. He reports he felt "fairly down" this week. He was supportive of other group members and discussed how this group helps him feel "more grounded for his week".  Summary of Group: Pts were active and engaged in group skillbuilding session for anxiety, depression, and Chemical Dependency. Pts were encouraged to share about their experience of recovery, skills used, and challenges faced in past week. Pts check in briefly then processed, then learned skill of "how to get honest w/ yourself by assessing your self talk".   Archie Balboa, LPCA, LCASA

## 2017-07-20 ENCOUNTER — Ambulatory Visit (HOSPITAL_COMMUNITY): Payer: Self-pay | Admitting: Licensed Clinical Social Worker

## 2017-07-27 ENCOUNTER — Ambulatory Visit (INDEPENDENT_AMBULATORY_CARE_PROVIDER_SITE_OTHER): Payer: 59 | Admitting: Licensed Clinical Social Worker

## 2017-07-27 DIAGNOSIS — F1021 Alcohol dependence, in remission: Secondary | ICD-10-CM

## 2017-08-03 ENCOUNTER — Encounter (HOSPITAL_COMMUNITY): Payer: Self-pay | Admitting: Licensed Clinical Social Worker

## 2017-08-03 ENCOUNTER — Ambulatory Visit (INDEPENDENT_AMBULATORY_CARE_PROVIDER_SITE_OTHER): Payer: 59 | Admitting: Licensed Clinical Social Worker

## 2017-08-03 DIAGNOSIS — F1021 Alcohol dependence, in remission: Secondary | ICD-10-CM

## 2017-08-03 DIAGNOSIS — F418 Other specified anxiety disorders: Secondary | ICD-10-CM

## 2017-08-03 NOTE — Progress Notes (Signed)
  Weekly Group Progress Note  Program: OUTPATIENT SKILLS GROUP  Group Time: 5:30-6:30pm  Participation Level: Active  Behavioral Response: Appropriate and Sharing  Type of Therapy:  Psycho-education Group  Skills discussed: Open Communication    Summary of Progress: Pt was active and engaged in group. HE reports continued management of depression and anxiety through mindfulness and reaching out for help from his sponsor. He continues to go to [facility] and now has 74yr of sobriety from Alcohol. Pt gave supportive and encouraging feedback for other group members.  Summary of Group: Pts were active and engaged in group skillbuilding session for anxiety, depression, and Chemical Dependency. Pts were encouraged to share about their experience of recovery, skills used, and challenges faced in past week. Pts checked in briefly then processed their challenges as a group, w/ each member seeking feedback form other members about their specific challenges. One new member was present and active during session.   Archie Balboa, LPCA, LCASA

## 2017-08-03 NOTE — Progress Notes (Signed)
  Weekly Group Progress Note  Program: OUTPATIENT SKILLS GROUP  Group Time: 5:30-6:30pm  Participation Level: Active  Behavioral Response: Appropriate and Sharing  Type of Therapy:  Psycho-education Group  Skills discussed: Emotions/ Feelings work  Summary of Progress: Pt was open and sharing. Reported he felt pain in his legs from gardening today and "almost didn't come to group". Pt appeared agitated and expressed he was "somewhat irritated by his pain but was doing his best to be happy for group time". Pt struggled to comprehend meaning of some emotions and occasionally confused meanings of differing emotions.  Summary of Group: Pts were active and engaged in group skillbuilding session for anxiety, depression, and Chemical Dependency. Pts were encouraged to share about their experience of recovery, skills used, and challenges faced in past week. Pts checked in briefly then processed their challenges as a group, w/ each member seeking feedback form other members about their specific challenges. Skill discussed: Increasing Emotional understanding and vocabulary, feelings integration, somatic experiencing of feelings.   Archie Balboa, LPCA, LCASA

## 2017-08-10 ENCOUNTER — Ambulatory Visit (INDEPENDENT_AMBULATORY_CARE_PROVIDER_SITE_OTHER): Payer: 59 | Admitting: Licensed Clinical Social Worker

## 2017-08-10 DIAGNOSIS — F1021 Alcohol dependence, in remission: Secondary | ICD-10-CM

## 2017-08-12 ENCOUNTER — Encounter (HOSPITAL_COMMUNITY): Payer: Self-pay | Admitting: Licensed Clinical Social Worker

## 2017-08-12 NOTE — Progress Notes (Signed)
  Weekly Group Progress Note  Program: OUTPATIENT SKILLS GROUP  Group Time: 5:30-6:30pm  Participation Level: Active  Behavioral Response: Appropriate and Sharing  Type of Therapy:  Psycho-education Group  Skills discussed: Body Posture and physiology    Summary of Progress: Pt continues to report sustained sobriety, engagement in 12 Step community, and lessening of distress. Pt reports he is a 10/10 on a 10 point scale w/ 10 being best. He reports on a "minor annoyance" in getting his lawn care taken care of. 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 Pt was encouraging and supportive of new group member.  Summary of Group: Pts were active and engaged in group skillbuilding session for anxiety, depression, and Chemical Dependency. Pts were encouraged to share about their experience of recovery, skills used, and challenges faced in past week. Pts checked in briefly then processed their challenges as a group, w/ each member seeking feedback form other members about their specific challenges. Skill discussed: Body Posture and physiological response.   Daniel [name], LPCA, LCASA

## 2017-08-17 ENCOUNTER — Ambulatory Visit (INDEPENDENT_AMBULATORY_CARE_PROVIDER_SITE_OTHER): Payer: 59 | Admitting: Licensed Clinical Social Worker

## 2017-08-17 DIAGNOSIS — F418 Other specified anxiety disorders: Secondary | ICD-10-CM | POA: Diagnosis not present

## 2017-08-17 DIAGNOSIS — F1021 Alcohol dependence, in remission: Secondary | ICD-10-CM

## 2017-08-24 ENCOUNTER — Ambulatory Visit (INDEPENDENT_AMBULATORY_CARE_PROVIDER_SITE_OTHER): Payer: 59 | Admitting: Licensed Clinical Social Worker

## 2017-08-24 DIAGNOSIS — F418 Other specified anxiety disorders: Secondary | ICD-10-CM

## 2017-08-24 DIAGNOSIS — F1021 Alcohol dependence, in remission: Secondary | ICD-10-CM | POA: Diagnosis not present

## 2017-08-26 ENCOUNTER — Encounter (HOSPITAL_COMMUNITY): Payer: Self-pay | Admitting: Licensed Clinical Social Worker

## 2017-08-26 NOTE — Progress Notes (Signed)
  Weekly Group Progress Note  Program: OUTPATIENT SKILLS GROUP  Group Time: 5:30-6:30pm  Participation Level: Active  Behavioral Response: Appropriate and Sharing  Type of Therapy:  Psycho-education Group  Skills discussed: Mindfulness Meditation, Body Scan   Summary of Progress: Pt was active and engaged in group discussion. He reports he is feeling positive and sustaining improved mood control and ability to challenge negative thinking.  Summary of Group: Pts were active and engaged in group skillbuilding session for anxiety, depression, and Chemical Dependency. Pts were encouraged to share about their experience of recovery, skills used, and challenges faced in past week. Pts checked in briefly then processed their challenges as a group, w/ each member seeking feedback form other members about their specific challenges. Skill discussed: Mindfulness meditation and 15 min body scan exercise.   Archie Balboa, LPCA, LCASA

## 2017-08-29 ENCOUNTER — Encounter (HOSPITAL_COMMUNITY): Payer: Self-pay | Admitting: Licensed Clinical Social Worker

## 2017-08-29 NOTE — Progress Notes (Signed)
  Weekly Group Progress Note  Program: OUTPATIENT SKILLS GROUP  Group Time: 5:30-6:30pm  Participation Level: Active  Behavioral Response: Appropriate and Sharing  Type of Therapy:  Psycho-education Group  Skills discussed: Challenging cognitive distortions   Summary of Progress: Pt was active and engaged in group session. He continues to report sustained remission from his Alcohol Use Disorder. He continues to meet sponsor and attend meetings. Pt shared somewhat vaguely and w/o detail about his own recovery. Pt did not share about his recent personal life. Pt did not share about his upcoming court case.  Summary of Group: Pts were active and engaged in group skillbuilding session for anxiety, depression, and Chemical Dependency. Pts were encouraged to share about their experience of recovery, skills used, and challenges faced in past week. Pts checked in briefly then processed their challenges as a group, w/ each member seeking feedback form other members about their specific challenges. Skill discussed: Cognitive distortions. Pts discussed a handout and read through various negative styles of thinking and learned coping skills.   Archie Balboa, LPCA, LCASA

## 2017-08-31 ENCOUNTER — Ambulatory Visit (INDEPENDENT_AMBULATORY_CARE_PROVIDER_SITE_OTHER): Payer: 59 | Admitting: Licensed Clinical Social Worker

## 2017-08-31 DIAGNOSIS — F418 Other specified anxiety disorders: Secondary | ICD-10-CM

## 2017-08-31 DIAGNOSIS — F1021 Alcohol dependence, in remission: Secondary | ICD-10-CM

## 2017-09-02 ENCOUNTER — Encounter (HOSPITAL_COMMUNITY): Payer: Self-pay | Admitting: Licensed Clinical Social Worker

## 2017-09-02 NOTE — Progress Notes (Signed)
  Weekly Group Progress Note  Program: OUTPATIENT SKILLS GROUP  Group Time: 5:30-6:30pm  Participation Level: Active  Behavioral Response: Appropriate and Sharing  Type of Therapy:  Psycho-education Group  Skills discussed: Distress tolerance, Communication   Summary of Progress: Pt was active, engaged. He shared about how positive he felt and how he had been happier since his yard is looking better and he has been gardening daily for past week. Pt helped another member consider various solutions for solving a interpersonal conflict w/ his sponsor. Pt asked for a letter confirming his attendance at all Outpatient Skills Group sessions, 12 in total thus far, for an upcoming court appearance. Counselor provided letter in sealed envelope.  Summary of Group: Pts were active and engaged in group skillbuilding session for anxiety, depression, and Chemical Dependency. Pts were encouraged to share about their experience of recovery, skills used, and challenges faced in past week. Pts checked in briefly then processed their challenges as a group, w/ each member seeking feedback form other members about their specific challenges.  Pts were active and engaged in check in and discussion about how to "tolerate distressing emotions such as anger and sadness". Pts were invited to strategize about new methods of communication w/ their loved ones. One pt asked for help on deciding about how to continue w his sponsor.   Archie Balboa, LPCA, LCASA

## 2017-09-07 ENCOUNTER — Ambulatory Visit (HOSPITAL_COMMUNITY): Payer: Self-pay | Admitting: Licensed Clinical Social Worker

## 2017-09-14 ENCOUNTER — Ambulatory Visit (INDEPENDENT_AMBULATORY_CARE_PROVIDER_SITE_OTHER): Payer: 59 | Admitting: Licensed Clinical Social Worker

## 2017-09-14 DIAGNOSIS — F418 Other specified anxiety disorders: Secondary | ICD-10-CM | POA: Diagnosis not present

## 2017-09-14 DIAGNOSIS — F1021 Alcohol dependence, in remission: Secondary | ICD-10-CM

## 2017-09-19 ENCOUNTER — Encounter (HOSPITAL_COMMUNITY): Payer: Self-pay | Admitting: Licensed Clinical Social Worker

## 2017-09-19 NOTE — Progress Notes (Signed)
  Weekly Group Progress Note  Program: OUTPATIENT SKILLS GROUP  Group Time: 5:30-6:30pm  Participation Level: Active  Behavioral Response: Appropriate and Sharing  Type of Therapy:  Psycho-education Group  Skills discussed: Daniel [name], DBT Interpersonal Effectiveness   Summary of Progress: Pt reports sustained progress w/ addiction to alcohol and continued sobriety since 07/2016. He reports his sponsor recently relapsed on alcohol after 13 yrs of sobriety and this was a "fresh wake up call that this process is never finished". Pt verbalizes strong commitment to sobriety and 12 Step fellowship. Pt verbalizes insight into chronic nature of his alcohol dependence.   Summary of Group: Pts were active and engaged in group skillbuilding session for anxiety, depression, and Chemical Dependency. Pts were encouraged to share about their experience of recovery, skills used, and challenges faced in past week. Pts checked in briefly then processed their challenges as a group, w/ each member seeking feedback from other members about their specific challenges. DBT Skills of [facility] were discussed and taught in 30 min psychoed.   Daniel [name], LPCA, LCASA

## 2017-09-26 DIAGNOSIS — I2510 Atherosclerotic heart disease of native coronary artery without angina pectoris: Secondary | ICD-10-CM | POA: Diagnosis not present

## 2017-09-26 DIAGNOSIS — W1842XA Slipping, tripping and stumbling without falling due to stepping into hole or opening, initial encounter: Secondary | ICD-10-CM | POA: Diagnosis not present

## 2017-09-26 DIAGNOSIS — Z01818 Encounter for other preprocedural examination: Secondary | ICD-10-CM | POA: Diagnosis not present

## 2017-09-26 DIAGNOSIS — I4891 Unspecified atrial fibrillation: Secondary | ICD-10-CM | POA: Diagnosis present

## 2017-09-26 DIAGNOSIS — E875 Hyperkalemia: Secondary | ICD-10-CM | POA: Diagnosis not present

## 2017-09-26 DIAGNOSIS — Z7982 Long term (current) use of aspirin: Secondary | ICD-10-CM | POA: Diagnosis not present

## 2017-09-26 DIAGNOSIS — T402X5A Adverse effect of other opioids, initial encounter: Secondary | ICD-10-CM | POA: Diagnosis not present

## 2017-09-26 DIAGNOSIS — Z789 Other specified health status: Secondary | ICD-10-CM | POA: Diagnosis not present

## 2017-09-26 DIAGNOSIS — K5903 Drug induced constipation: Secondary | ICD-10-CM | POA: Diagnosis not present

## 2017-09-26 DIAGNOSIS — W1831XA Fall on same level due to stepping on an object, initial encounter: Secondary | ICD-10-CM | POA: Diagnosis not present

## 2017-09-26 DIAGNOSIS — Y92017 Garden or yard in single-family (private) house as the place of occurrence of the external cause: Secondary | ICD-10-CM | POA: Diagnosis not present

## 2017-09-26 DIAGNOSIS — Z8673 Personal history of transient ischemic attack (TIA), and cerebral infarction without residual deficits: Secondary | ICD-10-CM | POA: Diagnosis not present

## 2017-09-26 DIAGNOSIS — S73005A Unspecified dislocation of left hip, initial encounter: Secondary | ICD-10-CM | POA: Diagnosis not present

## 2017-09-26 DIAGNOSIS — Z96642 Presence of left artificial hip joint: Secondary | ICD-10-CM | POA: Diagnosis not present

## 2017-09-26 DIAGNOSIS — I10 Essential (primary) hypertension: Secondary | ICD-10-CM | POA: Diagnosis not present

## 2017-09-26 DIAGNOSIS — Z9889 Other specified postprocedural states: Secondary | ICD-10-CM | POA: Diagnosis not present

## 2017-09-26 DIAGNOSIS — M25512 Pain in left shoulder: Secondary | ICD-10-CM | POA: Diagnosis not present

## 2017-09-26 DIAGNOSIS — D62 Acute posthemorrhagic anemia: Secondary | ICD-10-CM | POA: Diagnosis not present

## 2017-09-26 DIAGNOSIS — Y999 Unspecified external cause status: Secondary | ICD-10-CM | POA: Diagnosis not present

## 2017-09-26 DIAGNOSIS — W172XXA Fall into hole, initial encounter: Secondary | ICD-10-CM | POA: Diagnosis not present

## 2017-09-26 DIAGNOSIS — Y93H9 Activity, other involving exterior property and land maintenance, building and construction: Secondary | ICD-10-CM | POA: Diagnosis not present

## 2017-09-26 DIAGNOSIS — F17200 Nicotine dependence, unspecified, uncomplicated: Secondary | ICD-10-CM | POA: Diagnosis not present

## 2017-09-26 DIAGNOSIS — F17210 Nicotine dependence, cigarettes, uncomplicated: Secondary | ICD-10-CM | POA: Diagnosis not present

## 2017-09-26 DIAGNOSIS — Z7902 Long term (current) use of antithrombotics/antiplatelets: Secondary | ICD-10-CM | POA: Diagnosis not present

## 2017-09-26 DIAGNOSIS — T84021A Dislocation of internal left hip prosthesis, initial encounter: Secondary | ICD-10-CM | POA: Diagnosis not present

## 2017-09-26 DIAGNOSIS — M25552 Pain in left hip: Secondary | ICD-10-CM | POA: Diagnosis not present

## 2017-09-26 DIAGNOSIS — E785 Hyperlipidemia, unspecified: Secondary | ICD-10-CM | POA: Diagnosis not present

## 2017-09-26 DIAGNOSIS — Z8249 Family history of ischemic heart disease and other diseases of the circulatory system: Secondary | ICD-10-CM | POA: Diagnosis not present

## 2017-09-26 DIAGNOSIS — T402X5D Adverse effect of other opioids, subsequent encounter: Secondary | ICD-10-CM | POA: Diagnosis not present

## 2017-09-28 ENCOUNTER — Ambulatory Visit (HOSPITAL_COMMUNITY): Payer: Self-pay | Admitting: Licensed Clinical Social Worker

## 2017-10-05 ENCOUNTER — Ambulatory Visit (HOSPITAL_COMMUNITY): Payer: Self-pay | Admitting: Licensed Clinical Social Worker

## 2017-10-12 ENCOUNTER — Ambulatory Visit (HOSPITAL_COMMUNITY): Payer: Self-pay | Admitting: Licensed Clinical Social Worker

## 2017-10-17 DIAGNOSIS — R21 Rash and other nonspecific skin eruption: Secondary | ICD-10-CM | POA: Diagnosis not present

## 2017-10-21 DIAGNOSIS — Z96642 Presence of left artificial hip joint: Secondary | ICD-10-CM | POA: Diagnosis not present

## 2017-10-21 DIAGNOSIS — T84021D Dislocation of internal left hip prosthesis, subsequent encounter: Secondary | ICD-10-CM | POA: Diagnosis not present

## 2017-10-22 DIAGNOSIS — Z8719 Personal history of other diseases of the digestive system: Secondary | ICD-10-CM | POA: Diagnosis not present

## 2017-10-22 DIAGNOSIS — F329 Major depressive disorder, single episode, unspecified: Secondary | ICD-10-CM | POA: Diagnosis not present

## 2017-10-22 DIAGNOSIS — I10 Essential (primary) hypertension: Secondary | ICD-10-CM | POA: Diagnosis not present

## 2017-10-22 DIAGNOSIS — F17200 Nicotine dependence, unspecified, uncomplicated: Secondary | ICD-10-CM | POA: Diagnosis not present

## 2017-10-22 DIAGNOSIS — Z720 Tobacco use: Secondary | ICD-10-CM | POA: Diagnosis not present

## 2017-10-22 DIAGNOSIS — T84021D Dislocation of internal left hip prosthesis, subsequent encounter: Secondary | ICD-10-CM | POA: Diagnosis not present

## 2017-10-22 DIAGNOSIS — Z955 Presence of coronary angioplasty implant and graft: Secondary | ICD-10-CM | POA: Diagnosis not present

## 2017-10-22 DIAGNOSIS — Z9889 Other specified postprocedural states: Secondary | ICD-10-CM | POA: Diagnosis not present

## 2017-10-22 DIAGNOSIS — Z8659 Personal history of other mental and behavioral disorders: Secondary | ICD-10-CM | POA: Diagnosis not present

## 2017-10-22 DIAGNOSIS — T84021A Dislocation of internal left hip prosthesis, initial encounter: Secondary | ICD-10-CM | POA: Diagnosis not present

## 2017-10-22 DIAGNOSIS — Z7902 Long term (current) use of antithrombotics/antiplatelets: Secondary | ICD-10-CM | POA: Diagnosis not present

## 2017-10-22 DIAGNOSIS — Z9861 Coronary angioplasty status: Secondary | ICD-10-CM | POA: Diagnosis not present

## 2017-10-22 DIAGNOSIS — Z8673 Personal history of transient ischemic attack (TIA), and cerebral infarction without residual deficits: Secondary | ICD-10-CM | POA: Diagnosis not present

## 2017-10-22 DIAGNOSIS — E785 Hyperlipidemia, unspecified: Secondary | ICD-10-CM | POA: Diagnosis not present

## 2017-10-22 DIAGNOSIS — K5900 Constipation, unspecified: Secondary | ICD-10-CM | POA: Diagnosis not present

## 2017-10-22 DIAGNOSIS — I2510 Atherosclerotic heart disease of native coronary artery without angina pectoris: Secondary | ICD-10-CM | POA: Diagnosis not present

## 2017-10-23 DIAGNOSIS — K5903 Drug induced constipation: Secondary | ICD-10-CM | POA: Diagnosis not present

## 2017-10-23 DIAGNOSIS — Z9889 Other specified postprocedural states: Secondary | ICD-10-CM | POA: Diagnosis not present

## 2017-10-23 DIAGNOSIS — T402X5D Adverse effect of other opioids, subsequent encounter: Secondary | ICD-10-CM | POA: Diagnosis not present

## 2017-10-23 DIAGNOSIS — I10 Essential (primary) hypertension: Secondary | ICD-10-CM | POA: Diagnosis not present

## 2017-10-23 DIAGNOSIS — F329 Major depressive disorder, single episode, unspecified: Secondary | ICD-10-CM | POA: Diagnosis not present

## 2017-10-23 DIAGNOSIS — Z8673 Personal history of transient ischemic attack (TIA), and cerebral infarction without residual deficits: Secondary | ICD-10-CM | POA: Diagnosis not present

## 2017-10-23 DIAGNOSIS — E785 Hyperlipidemia, unspecified: Secondary | ICD-10-CM | POA: Diagnosis not present

## 2017-10-23 DIAGNOSIS — F17200 Nicotine dependence, unspecified, uncomplicated: Secondary | ICD-10-CM | POA: Diagnosis not present

## 2017-10-23 DIAGNOSIS — Z9861 Coronary angioplasty status: Secondary | ICD-10-CM | POA: Diagnosis not present

## 2017-10-23 DIAGNOSIS — T84021D Dislocation of internal left hip prosthesis, subsequent encounter: Secondary | ICD-10-CM | POA: Diagnosis not present

## 2017-10-23 DIAGNOSIS — Z955 Presence of coronary angioplasty implant and graft: Secondary | ICD-10-CM | POA: Diagnosis not present

## 2017-10-23 DIAGNOSIS — I2510 Atherosclerotic heart disease of native coronary artery without angina pectoris: Secondary | ICD-10-CM | POA: Diagnosis not present

## 2017-10-24 DIAGNOSIS — F329 Major depressive disorder, single episode, unspecified: Secondary | ICD-10-CM | POA: Diagnosis not present

## 2017-10-24 DIAGNOSIS — Z96642 Presence of left artificial hip joint: Secondary | ICD-10-CM | POA: Diagnosis not present

## 2017-10-24 DIAGNOSIS — T84021A Dislocation of internal left hip prosthesis, initial encounter: Secondary | ICD-10-CM | POA: Diagnosis not present

## 2017-10-24 DIAGNOSIS — E785 Hyperlipidemia, unspecified: Secondary | ICD-10-CM | POA: Diagnosis not present

## 2017-10-24 DIAGNOSIS — Z720 Tobacco use: Secondary | ICD-10-CM | POA: Diagnosis not present

## 2017-10-24 DIAGNOSIS — Z8659 Personal history of other mental and behavioral disorders: Secondary | ICD-10-CM | POA: Diagnosis not present

## 2017-10-24 DIAGNOSIS — I10 Essential (primary) hypertension: Secondary | ICD-10-CM | POA: Diagnosis not present

## 2017-10-24 DIAGNOSIS — I2510 Atherosclerotic heart disease of native coronary artery without angina pectoris: Secondary | ICD-10-CM | POA: Diagnosis not present

## 2017-10-24 DIAGNOSIS — Z8673 Personal history of transient ischemic attack (TIA), and cerebral infarction without residual deficits: Secondary | ICD-10-CM | POA: Diagnosis not present

## 2017-11-09 DIAGNOSIS — Z4789 Encounter for other orthopedic aftercare: Secondary | ICD-10-CM | POA: Diagnosis not present

## 2018-05-29 DIAGNOSIS — Z955 Presence of coronary angioplasty implant and graft: Secondary | ICD-10-CM | POA: Diagnosis not present

## 2018-05-29 DIAGNOSIS — E785 Hyperlipidemia, unspecified: Secondary | ICD-10-CM | POA: Diagnosis not present

## 2018-05-29 DIAGNOSIS — I10 Essential (primary) hypertension: Secondary | ICD-10-CM | POA: Diagnosis not present

## 2018-05-29 DIAGNOSIS — M5440 Lumbago with sciatica, unspecified side: Secondary | ICD-10-CM | POA: Diagnosis not present

## 2018-05-29 DIAGNOSIS — G8911 Acute pain due to trauma: Secondary | ICD-10-CM | POA: Diagnosis not present

## 2018-05-29 DIAGNOSIS — Z79899 Other long term (current) drug therapy: Secondary | ICD-10-CM | POA: Diagnosis not present

## 2018-05-29 DIAGNOSIS — F17200 Nicotine dependence, unspecified, uncomplicated: Secondary | ICD-10-CM | POA: Diagnosis not present

## 2018-05-29 DIAGNOSIS — Z91013 Allergy to seafood: Secondary | ICD-10-CM | POA: Diagnosis not present

## 2018-05-29 DIAGNOSIS — Z043 Encounter for examination and observation following other accident: Secondary | ICD-10-CM | POA: Diagnosis not present

## 2018-05-29 DIAGNOSIS — Z7901 Long term (current) use of anticoagulants: Secondary | ICD-10-CM | POA: Diagnosis not present

## 2018-05-29 DIAGNOSIS — M5442 Lumbago with sciatica, left side: Secondary | ICD-10-CM | POA: Diagnosis not present

## 2018-05-29 DIAGNOSIS — Z8673 Personal history of transient ischemic attack (TIA), and cerebral infarction without residual deficits: Secondary | ICD-10-CM | POA: Diagnosis not present

## 2018-05-29 DIAGNOSIS — M5441 Lumbago with sciatica, right side: Secondary | ICD-10-CM | POA: Diagnosis not present

## 2018-05-29 DIAGNOSIS — Z7982 Long term (current) use of aspirin: Secondary | ICD-10-CM | POA: Diagnosis not present

## 2018-09-18 ENCOUNTER — Other Ambulatory Visit: Payer: Self-pay | Admitting: Interventional Cardiology

## 2018-09-18 DIAGNOSIS — E785 Hyperlipidemia, unspecified: Secondary | ICD-10-CM

## 2018-09-21 ENCOUNTER — Other Ambulatory Visit: Payer: Self-pay | Admitting: Interventional Cardiology

## 2018-12-19 ENCOUNTER — Other Ambulatory Visit: Payer: Self-pay | Admitting: Interventional Cardiology

## 2018-12-19 DIAGNOSIS — E785 Hyperlipidemia, unspecified: Secondary | ICD-10-CM

## 2018-12-19 MED ORDER — ATORVASTATIN CALCIUM 40 MG PO TABS: ORAL_TABLET | ORAL | 0 refills | Status: DC

## 2018-12-19 MED ORDER — CLOPIDOGREL BISULFATE 75 MG PO TABS: 75.0000 mg | ORAL_TABLET | Freq: Every day | ORAL | 0 refills | Status: DC

## 2018-12-19 MED ORDER — METOPROLOL SUCCINATE ER 25 MG PO TB24: 25.0000 mg | ORAL_TABLET | Freq: Every day | ORAL | 0 refills | Status: DC

## 2018-12-19 NOTE — Addendum Note (Signed)
Addended by: Derl Barrow on: 12/19/2018 12:15 PM   Modules accepted: Orders

## 2018-12-22 ENCOUNTER — Ambulatory Visit (INDEPENDENT_AMBULATORY_CARE_PROVIDER_SITE_OTHER): Payer: 59 | Admitting: Cardiology

## 2018-12-22 ENCOUNTER — Other Ambulatory Visit: Payer: Self-pay

## 2018-12-22 ENCOUNTER — Encounter: Payer: Self-pay | Admitting: Cardiology

## 2018-12-22 VITALS — BP 142/78 | HR 57 | Ht 71.0 in | Wt 205.8 lb

## 2018-12-22 DIAGNOSIS — R079 Chest pain, unspecified: Secondary | ICD-10-CM | POA: Diagnosis not present

## 2018-12-22 DIAGNOSIS — I10 Essential (primary) hypertension: Secondary | ICD-10-CM

## 2018-12-22 DIAGNOSIS — R0789 Other chest pain: Secondary | ICD-10-CM | POA: Diagnosis not present

## 2018-12-22 DIAGNOSIS — Z9861 Coronary angioplasty status: Secondary | ICD-10-CM

## 2018-12-22 DIAGNOSIS — I2510 Atherosclerotic heart disease of native coronary artery without angina pectoris: Secondary | ICD-10-CM | POA: Diagnosis not present

## 2018-12-22 DIAGNOSIS — E785 Hyperlipidemia, unspecified: Secondary | ICD-10-CM | POA: Diagnosis not present

## 2018-12-22 DIAGNOSIS — I200 Unstable angina: Secondary | ICD-10-CM

## 2018-12-22 NOTE — Patient Instructions (Signed)
Medication Instructions:  NONE If you need a refill on your cardiac medications before your next appointment, please call your pharmacy.   Lab work: SAME DAY AS STRESS TEST FLP CBC BMP HFT If you have labs (blood work) drawn today and your tests are completely normal, you will receive your results only by: Marland Kitchen MyChart Message (if you have MyChart) OR . A paper copy in the mail If you have any lab test that is abnormal or we need to change your treatment, we will call you to review the results.  Testing/Procedures:PLEASE SCHEDULE Your physician has requested that you have a lexiscan myoview. For further information please visit [email or web address]. Please follow instruction sheet, as given.    Follow-Up:  3 WEEKS AFTER STRESS TEST with Dr Irish Lack or APP At [facility], you and your health needs are our priority.  As part of our continuing mission to provide you with exceptional heart care, we have created designated Provider Care Teams.  These Care Teams include your primary Cardiologist (physician) and Advanced Practice Providers (APPs -  Physician Assistants and Nurse Practitioners) who all work together to provide you with the care you need, when you need it. .   Any Other Special Instructions Will Be Listed Below (If Applicable).
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
## 2018-12-27 ENCOUNTER — Telehealth (HOSPITAL_COMMUNITY): Payer: Self-pay | Admitting: *Deleted

## 2018-12-27 NOTE — Telephone Encounter (Signed)
Left message on voicemail in reference to upcoming appointment scheduled for 12/29/18. Phone number given for a call back so details instructions can be given. Daniel [name]

## 2018-12-28 ENCOUNTER — Telehealth (HOSPITAL_COMMUNITY): Payer: Self-pay | Admitting: *Deleted

## 2018-12-28 NOTE — Telephone Encounter (Signed)
Left message on voicemail in reference to upcoming appointment scheduled for 12/29/18. Phone number given for a call back so details instructions can be given. Kirstie Peri

## 2018-12-29 ENCOUNTER — Telehealth: Payer: Self-pay

## 2018-12-29 ENCOUNTER — Other Ambulatory Visit: Payer: Self-pay

## 2018-12-29 ENCOUNTER — Ambulatory Visit (HOSPITAL_COMMUNITY): Payer: 59 | Attending: Internal Medicine

## 2018-12-29 ENCOUNTER — Other Ambulatory Visit: Payer: Medicare Other | Admitting: *Deleted

## 2018-12-29 DIAGNOSIS — R079 Chest pain, unspecified: Secondary | ICD-10-CM

## 2018-12-29 DIAGNOSIS — I10 Essential (primary) hypertension: Secondary | ICD-10-CM

## 2018-12-29 DIAGNOSIS — E785 Hyperlipidemia, unspecified: Secondary | ICD-10-CM

## 2018-12-29 LAB — BASIC METABOLIC PANEL
BUN/Creatinine Ratio: 23 (ref 10–24)
BUN: 19 mg/dL (ref 8–27)
CO2: 24 mmol/L (ref 20–29)
Calcium: 9.5 mg/dL (ref 8.6–10.2)
Chloride: 104 mmol/L (ref 96–106)
Creatinine, Ser: 0.84 mg/dL (ref 0.76–1.27)
GFR calc Af Amer: 105 mL/min/{1.73_m2} (ref >59)
GFR calc non Af Amer: 91 mL/min/{1.73_m2} (ref >59)
Glucose: 86 mg/dL (ref 65–99)
Potassium: 5 mmol/L (ref 3.5–5.2)
Sodium: 142 mmol/L (ref 134–144)

## 2018-12-29 LAB — HEPATIC FUNCTION PANEL
ALT: 27 IU/L (ref 0–44)
AST: 21 IU/L (ref 0–40)
Albumin: 4.5 g/dL (ref 3.8–4.8)
Alkaline Phosphatase: 85 IU/L (ref 39–117)
Bilirubin Total: 0.6 mg/dL (ref 0.0–1.2)
Bilirubin, Direct: 0.17 mg/dL (ref 0.00–0.40)
Total Protein: 6.2 g/dL (ref 6.0–8.5)

## 2018-12-29 LAB — CBC
Hematocrit: 44.3 % (ref 37.5–51.0)
Hemoglobin: 14.8 g/dL (ref 13.0–17.7)
MCH: 30.5 pg (ref 26.6–33.0)
MCHC: 33.4 g/dL (ref 31.5–35.7)
MCV: 91 fL (ref 79–97)
Platelets: 185 10*3/uL (ref 150–450)
RBC: 4.86 x10E6/uL (ref 4.14–5.80)
RDW: 13.3 % (ref 11.6–15.4)
WBC: 5.8 10*3/uL (ref 3.4–10.8)

## 2018-12-29 LAB — MYOCARDIAL PERFUSION IMAGING
LV dias vol: 97 mL (ref 62–150)
LV sys vol: 45 mL
Peak HR: 82 {beats}/min
Rest HR: 51 {beats}/min
SDS: 3
SRS: 6
SSS: 10
TID: 1.05

## 2018-12-29 LAB — LIPID PANEL
Chol/HDL Ratio: 3.8 ratio (ref 0.0–5.0)
Cholesterol, Total: 110 mg/dL (ref 100–199)
HDL: 29 mg/dL — ABNORMAL LOW (ref >39)
LDL Calculated: 64 mg/dL (ref 0–99)
Triglycerides: 84 mg/dL (ref 0–149)
VLDL Cholesterol Cal: 17 mg/dL (ref 5–40)

## 2018-12-29 MED ORDER — TECHNETIUM TC 99M TETROFOSMIN IV KIT
32.1000 | PACK | Freq: Once | INTRAVENOUS | Status: AC | PRN
  Administered 2018-12-29: 32.1 via INTRAVENOUS
  Filled 2018-12-29: qty 33

## 2018-12-29 MED ORDER — REGADENOSON 0.4 MG/5ML IV SOLN
0.4000 mg | Freq: Once | INTRAVENOUS | Status: AC
  Administered 2018-12-29: 0.4 mg via INTRAVENOUS

## 2018-12-29 MED ORDER — TECHNETIUM TC 99M TETROFOSMIN IV KIT
10.9000 | PACK | Freq: Once | INTRAVENOUS | Status: AC | PRN
  Administered 2018-12-29: 10.9 via INTRAVENOUS
  Filled 2018-12-29: qty 11

## 2018-12-29 NOTE — Telephone Encounter (Signed)
-----   Message from Charlie Pitter, [place] sent at 12/29/2018  4:05 PM EDT ----- Covering for Brittainy today. Routing to triage as Anderson Malta is off til next week. Please let patient know stress test showed small area of scar, but no ischemia. His BP was still a little high so await labs to determine med titration (Brittainy planned possibly titration of ACEI). Keep f/u 9/4 with Dr. Irish Lack. Notify for any recurrent episodes of CP. Dayna Dunn PA-C

## 2018-12-29 NOTE — Telephone Encounter (Signed)
LMTCB

## 2019-01-01 NOTE — Telephone Encounter (Signed)
The patient has been notified of the Stress Test results and verbalized understanding.  All questions (if any) were answered. Frederik Schmidt, RN 01/01/2019 9:14 AM

## 2019-01-01 NOTE — Telephone Encounter (Signed)
Follow up    Please call; stress test results

## 2019-01-01 NOTE — Telephone Encounter (Signed)
lpmtcb 8/24

## 2019-01-08 ENCOUNTER — Telehealth: Payer: Self-pay | Admitting: Interventional Cardiology

## 2019-01-08 NOTE — Telephone Encounter (Signed)
Pt called back though I was not available. I called back and left a message. Pt had stated to the operator he was confused that he got a call already about results. I will confirm with the pt that he did receive all of his results as it looks like some of the labs were not back yet.

## 2019-01-08 NOTE — Telephone Encounter (Signed)
Patient calling to go over the results of his blood work done 12/29/18. He is confused because he got a call from Daniel [name]  on Friday, and Daniel [name] indicated that everything was OK. The patient has an appt scheduled for Dr. Irish Lack on Friday 09/04. If the results are urgent then please call the patient, otherwise he will just wait to hear the results at his upcoming appt on Friday.

## 2019-01-09 ENCOUNTER — Telehealth: Payer: Self-pay | Admitting: Interventional Cardiology

## 2019-01-09 NOTE — Telephone Encounter (Signed)
Follow up: ° ° ° °Patient returning your call. Please call patient back. °

## 2019-01-09 NOTE — Telephone Encounter (Signed)
I have s/w pt today and have gone over all of his lab results as his Lipid was not back at the time [place] s/w pt the other day. Pt thanked me for the call back. Patient notified of result.  Please refer to phone note from today for complete details.   Julaine Hua, [place] 01/09/2019 10:36 AM  Pt has appt on 01/12/19 with Dr. Irish Lack. Pt wanted to let Dr. Irish Lack know that he has taken NTG quite often. I asked how often. Pt answered in a 2 week period he has maybe has taken 3-5 tablets. Pt states he only has taken 1 tablet each time, though his chest tightness/pain may sometimes last 1-2 hours. I educated pt as to how he should take NTG . I advised pt that if his pain is not better in 5 minutes from the 1st ntg that he should take a 2nd ntg and repeat if pain still there 5 minutes after 2nd ntg, total of 3 tablets in 15 minutes. Advised if he takes 3 ntg tabs he needs to call 911 when he takes the 3rd ntg. Pt thanked me for going over these instructions. Pt advised to keep his appt with Dr. Irish Lack 01/12/19. Pt has no c/o today. Pt does state though he was s/w his Psychiatrist about itching. Pt states Psychiatrist states his Plavix is probably causing the itching. Pt has been on Plavix he said since about 2016. I assured pt that I really don't feel that the Plavix is causing the itching as he has been on this for a number of yrs now, though to remember to mention to Dr. Irish Lack. Pt thanked me for the call and the time .
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
## 2019-01-12 ENCOUNTER — Ambulatory Visit (INDEPENDENT_AMBULATORY_CARE_PROVIDER_SITE_OTHER): Payer: 59 | Admitting: Interventional Cardiology

## 2019-01-12 ENCOUNTER — Other Ambulatory Visit: Payer: Self-pay

## 2019-01-12 ENCOUNTER — Encounter: Payer: Self-pay | Admitting: Interventional Cardiology

## 2019-01-12 VITALS — BP 114/60 | HR 85 | Ht 71.0 in | Wt 204.0 lb

## 2019-01-12 DIAGNOSIS — I10 Essential (primary) hypertension: Secondary | ICD-10-CM

## 2019-01-12 DIAGNOSIS — Z9861 Coronary angioplasty status: Secondary | ICD-10-CM

## 2019-01-12 DIAGNOSIS — I2510 Atherosclerotic heart disease of native coronary artery without angina pectoris: Secondary | ICD-10-CM

## 2019-01-12 DIAGNOSIS — Z720 Tobacco use: Secondary | ICD-10-CM | POA: Diagnosis not present

## 2019-01-12 DIAGNOSIS — E785 Hyperlipidemia, unspecified: Secondary | ICD-10-CM | POA: Diagnosis not present

## 2019-01-12 MED ORDER — ISOSORBIDE MONONITRATE ER 30 MG PO TB24: 30.0000 mg | ORAL_TABLET | Freq: Every day | ORAL | 3 refills | Status: DC

## 2019-01-12 MED ORDER — TICAGRELOR 90 MG PO TABS: 90.0000 mg | ORAL_TABLET | Freq: Two times a day (BID) | ORAL | 3 refills | Status: DC

## 2019-01-12 MED ORDER — ATORVASTATIN CALCIUM 40 MG PO TABS: ORAL_TABLET | ORAL | 3 refills | Status: DC

## 2019-01-12 NOTE — Patient Instructions (Addendum)
Medication Instructions:  Your physician has recommended you make the following change in your medication:   1. START: isosorbide mononitrate (imdur) 30 mg tablet: Take 1 tablet by mouth once a day---You may start off with a 1/2 tablet for the first few days  2. STOP: clopidogrel (plavix) after being on the isosorbide for a few days  3. START: ticagrelor (brilinta) 90 mg tablet when you stop plavix: Take 1 tablet by mouth twice a day  Lab work: None Ordered  If you have labs (blood work) drawn today and your tests are completely normal, you will receive your results only by: Marland Kitchen MyChart Message (if you have MyChart) OR . A paper copy in the mail If you have any lab test that is abnormal or we need to change your treatment, we will call you to review the results.  Testing/Procedures: None ordered  Follow-Up: Follow up with Dr. Irish Lack for a VIRTUAL Visit on 01/18/19 at 11:40 AM  Any Other Special Instructions Will Be Listed Below (If Applicable).

## 2019-01-17 ENCOUNTER — Encounter: Payer: Self-pay | Admitting: Interventional Cardiology

## 2019-01-18 ENCOUNTER — Encounter: Payer: Medicare Other | Admitting: Interventional Cardiology

## 2019-01-18 ENCOUNTER — Encounter: Payer: Self-pay | Admitting: Interventional Cardiology

## 2019-01-18 ENCOUNTER — Other Ambulatory Visit: Payer: Self-pay

## 2019-01-21 NOTE — Progress Notes (Signed)
Patient no-showed for virtual visit. 

## 2019-01-22 ENCOUNTER — Telehealth: Payer: Medicare Other | Admitting: Interventional Cardiology

## 2019-01-22 ENCOUNTER — Other Ambulatory Visit: Payer: Self-pay

## 2019-02-14 DIAGNOSIS — Z471 Aftercare following joint replacement surgery: Secondary | ICD-10-CM | POA: Diagnosis not present

## 2019-02-14 DIAGNOSIS — Z96642 Presence of left artificial hip joint: Secondary | ICD-10-CM | POA: Diagnosis not present

## 2019-02-14 DIAGNOSIS — Z9889 Other specified postprocedural states: Secondary | ICD-10-CM | POA: Diagnosis not present

## 2019-05-03 ENCOUNTER — Telehealth: Payer: Self-pay | Admitting: *Deleted

## 2019-05-03 ENCOUNTER — Encounter: Payer: Self-pay | Admitting: *Deleted

## 2019-05-03 MED ORDER — CLOPIDOGREL BISULFATE 75 MG PO TABS: 75.0000 mg | ORAL_TABLET | Freq: Every day | ORAL | 3 refills | Status: DC

## 2019-05-03 MED ORDER — METOPROLOL SUCCINATE ER 25 MG PO TB24: 25.0000 mg | ORAL_TABLET | Freq: Every day | ORAL | 3 refills | Status: DC

## 2019-05-03 MED ORDER — LISINOPRIL 40 MG PO TABS: 40.0000 mg | ORAL_TABLET | Freq: Every day | ORAL | 3 refills | Status: DC

## 2019-05-03 NOTE — Telephone Encounter (Signed)
Patient calls in with concerns of Brilinta causing extreme itching which is causing paranomia, due to fact same symptoms happened when patient had a stroke. Patient felt like last week  he needed to call EMT but he didn't.   Patient feels he need to be changed to something else, due to not feeling the same since stopping Plavix and Starting Brilinta. Patient is still taking medication as prescribes.  Patient was told that MD and RN will notified about concerns

## 2019-05-03 NOTE — Telephone Encounter (Signed)
Since the itching has persisted off of the Plavix, I don't think the plavix was the cause of the otching.  Go back to Plavix 75 mg daily and stop Brilinta.    Any more angina?

## 2019-05-03 NOTE — Addendum Note (Signed)
Addended by: Drue Novel I on: 05/03/2019 11:07 AM   Modules accepted: Orders

## 2019-05-03 NOTE — Telephone Encounter (Signed)
Called and made patient aware of recommendations to stop brilinta and start plavix 75 mg QD. Patient states that he has had some occasional chest discomfort with walking. He has not started the imdur yet that was prescribed in September. Patient requesting refills for other cardiac meds as well. Refills sent in. Instructed patient to start the imdur and continue to monitor Sx and let us know if they change or worsen. Patient verbalized understanding and thanked me for the call.

## 2019-05-06 NOTE — Telephone Encounter (Signed)
We were supposed to have a f/u several months ago for his angina and it appears he was a no show.   JV

## 2019-05-08 NOTE — Telephone Encounter (Signed)
Called to follow up with patient. He states that since switching the brilinta to plavix that the itching has improved. He also states that since starting the imdur that his chest discomfort has resolved. Patient is scheduled to see Dr. Irish Lack on 05/23/19 and will let us know if his Sx change or worsen before then.
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
## 2019-05-23 ENCOUNTER — Ambulatory Visit: Payer: 59 | Admitting: Interventional Cardiology

## 2019-08-23 ENCOUNTER — Ambulatory Visit (INDEPENDENT_AMBULATORY_CARE_PROVIDER_SITE_OTHER): Payer: 59 | Admitting: Interventional Cardiology

## 2019-08-23 ENCOUNTER — Other Ambulatory Visit: Payer: Self-pay

## 2019-08-23 ENCOUNTER — Encounter: Payer: Self-pay | Admitting: Interventional Cardiology

## 2019-08-23 VITALS — BP 134/76 | HR 84 | Ht 71.0 in | Wt 204.0 lb

## 2019-08-23 DIAGNOSIS — Z720 Tobacco use: Secondary | ICD-10-CM

## 2019-08-23 DIAGNOSIS — E782 Mixed hyperlipidemia: Secondary | ICD-10-CM

## 2019-08-23 DIAGNOSIS — I10 Essential (primary) hypertension: Secondary | ICD-10-CM | POA: Diagnosis not present

## 2019-08-23 DIAGNOSIS — I2510 Atherosclerotic heart disease of native coronary artery without angina pectoris: Secondary | ICD-10-CM
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
## 2019-08-23 NOTE — Patient Instructions (Addendum)
Medication Instructions:  Your physician recommends that you continue on your current medications as directed. Please refer to the Current Medication list given to you today.  *If you need a refill on your cardiac medications before your next appointment, please call your pharmacy*   Lab Work: TODAY: CBC, BMET  If you have labs (blood work) drawn today and your tests are completely normal, you will receive your results only by: Marland Kitchen MyChart Message (if you have MyChart) OR . A paper copy in the mail If you have any lab test that is abnormal or we need to change your treatment, we will call you to review the results.   Testing/Procedures: Your physician has requested that you have a cardiac catheterization. Cardiac catheterization is used to diagnose and/or treat various heart conditions. Doctors may recommend this procedure for a number of different reasons. The most common reason is to evaluate chest pain. Chest pain can be a symptom of coronary artery disease (CAD), and cardiac catheterization can show whether plaque is narrowing or blocking your heart's arteries. This procedure is also used to evaluate the valves, as well as measure the blood flow and oxygen levels in different parts of your heart. For further information please visit [email or web address]. Please follow instruction sheet, as given.  Follow-Up: Follow up with Dr. Irish Lack on 09/18/19 at 1:40 PM   Other [facility] OFFICE [place], [address] [place] [address] Dept: [phone] Loc: [place]  08/23/2019  You are scheduled for a Cardiac Catheterization on Tuesday, April 20 with Dr. Larae Grooms.  1. Please arrive at the [facility] (Main Entrance A) at [facility]: [address] [place], [place] [address] at 10:00 AM (This time is two hours before your procedure to ensure your  preparation). Free valet parking service is available.   Special note: Every effort is made to have your procedure done on time. Please understand that emergencies sometimes delay scheduled procedures.  2. Diet: Do not eat solid foods after midnight.  The patient may have clear liquids until 5am upon the day of the procedure.  3. Labs: TODAY: CBC, BMET   Your Pre-procedure COVID-19 Testing will be done on 08/25/19 at 12:10 PM at [facility] at [id number], [place], [place] [address]. Once you arrive at the testing site, stay in the right hand lane, go under the building overhang not the tent. If you are tested under the tent your results may not be back before your procedure. Please be on time for your appointment.  After your swab you will be given a mask to wear and instructed to go home and quarantine/no visitors until after your procedure. If you test positive you will be notified and your procedure will be cancelled.   4. Medication instructions in preparation for your procedure:   Contrast Allergy: No   On the morning of your procedure, take an 81 mg baby Aspirin and Brilinta/Ticagrelor and regular morning medicines.  You may use sips of water.  5. Plan for one night stay--bring personal belongings. 6. Bring a current list of your medications and current insurance cards. 7. You MUST have a responsible person to drive you home. 8. Someone MUST be with you the first 24 hours after you arrive home or your discharge will be delayed. 9. Please wear clothes that are easy to get on and off and  wear slip-on shoes.  Thank you for allowing [place] to care for you!   --  Invasive Cardiovascular services

## 2019-08-24 LAB — CBC
Hematocrit: 45.6 % (ref 37.5–51.0)
Hemoglobin: 15.2 g/dL (ref 13.0–17.7)
MCH: 29.3 pg (ref 26.6–33.0)
MCHC: 33.3 g/dL (ref 31.5–35.7)
MCV: 88 fL (ref 79–97)
Platelets: 197 10*3/uL (ref 150–450)
RBC: 5.18 x10E6/uL (ref 4.14–5.80)
RDW: 13.3 % (ref 11.6–15.4)
WBC: 7.6 10*3/uL (ref 3.4–10.8)

## 2019-08-24 LAB — BASIC METABOLIC PANEL
BUN/Creatinine Ratio: 19 (ref 10–24)
BUN: 18 mg/dL (ref 8–27)
CO2: 24 mmol/L (ref 20–29)
Calcium: 8.8 mg/dL (ref 8.6–10.2)
Chloride: 104 mmol/L (ref 96–106)
Creatinine, Ser: 0.93 mg/dL (ref 0.76–1.27)
GFR calc Af Amer: 97 mL/min/{1.73_m2} (ref >59)
GFR calc non Af Amer: 84 mL/min/{1.73_m2} (ref >59)
Glucose: 79 mg/dL (ref 65–99)
Potassium: 4.4 mmol/L (ref 3.5–5.2)
Sodium: 143 mmol/L (ref 134–144)

## 2019-08-25 ENCOUNTER — Other Ambulatory Visit (HOSPITAL_COMMUNITY)
Admission: RE | Admit: 2019-08-25 | Discharge: 2019-08-25 | Disposition: A | Discharge status: Home / Self Care | Payer: 59 | Source: Ambulatory Visit | Attending: Interventional Cardiology | Admitting: Interventional Cardiology

## 2019-08-25 DIAGNOSIS — Z20822 Contact with and (suspected) exposure to covid-19: Secondary | ICD-10-CM | POA: Diagnosis not present

## 2019-08-25 DIAGNOSIS — Z01812 Encounter for preprocedural laboratory examination: Secondary | ICD-10-CM | POA: Diagnosis not present

## 2019-08-25 LAB — SARS CORONAVIRUS 2 (TAT 6-24 HRS): SARS Coronavirus 2: NEGATIVE

## 2019-08-27 ENCOUNTER — Telehealth: Payer: Self-pay | Admitting: *Deleted

## 2019-08-27 NOTE — Telephone Encounter (Signed)
Pt contacted pre-catheterization scheduled at [facility] for: Tuesday August 28, 2019 12 noon Verified arrival time and place: [facility]) at: 10 AM   No solid food after midnight prior to cath, clear liquids until 5 AM day of procedure. Contrast allergy: no per pt's wife.  AM meds can be  taken pre-cath with sip of water including: ASA 81 mg Brilinta 90 mg   Confirmed patient has responsible adult to drive home post procedure and observe 24 hours after arriving home: yes  Currently, due to Covid-19 pandemic, only one person will be allowed with patient. Must be the same person for patient's entire stay and will be required to wear a mask. They will be asked to wait in the waiting room for the duration of the patient's stay.  Patients are required to wear a mask when they enter the hospital.      COVID-19 Pre-Screening Questions:  . In the past 7 to 10 days have you had a cough,  shortness of breath, headache, congestion, fever (100 or greater) body aches, chills, sore throat, or sudden loss of taste or sense of smell? Shortness of breath-off and on for several weeks . Have you been around anyone with known Covid 19 in the past 7 to 10 days? no . Have you been around anyone who is awaiting Covid 19 test results in the past 7 to 10 days? no . Have you been around anyone who has mentioned symptoms of Covid 19 within the past 7 to 10 days? no  Reviewed procedure/mask/visitor instructions, COVID-19 screening questions with patient and patient's wife.

## 2019-08-28 ENCOUNTER — Encounter (HOSPITAL_COMMUNITY): Payer: Self-pay | Admitting: Interventional Cardiology

## 2019-08-28 ENCOUNTER — Other Ambulatory Visit: Payer: Self-pay

## 2019-08-28 ENCOUNTER — Ambulatory Visit (HOSPITAL_COMMUNITY)
Admission: RE | Admit: 2019-08-28 | Discharge: 2019-08-29 | Disposition: A | Discharge status: Home / Self Care | Payer: 59 | Attending: Interventional Cardiology | Admitting: Interventional Cardiology

## 2019-08-28 ENCOUNTER — Ambulatory Visit (HOSPITAL_COMMUNITY)
Admission: RE | Disposition: A | Discharge status: Home / Self Care | Payer: Self-pay | Source: Home / Self Care | Attending: Interventional Cardiology

## 2019-08-28 DIAGNOSIS — Z79899 Other long term (current) drug therapy: Secondary | ICD-10-CM | POA: Diagnosis not present

## 2019-08-28 DIAGNOSIS — I6523 Occlusion and stenosis of bilateral carotid arteries: Secondary | ICD-10-CM | POA: Diagnosis not present

## 2019-08-28 DIAGNOSIS — F17200 Nicotine dependence, unspecified, uncomplicated: Secondary | ICD-10-CM | POA: Insufficient documentation

## 2019-08-28 DIAGNOSIS — Z86718 Personal history of other venous thrombosis and embolism: Secondary | ICD-10-CM | POA: Diagnosis not present

## 2019-08-28 DIAGNOSIS — Z955 Presence of coronary angioplasty implant and graft: Secondary | ICD-10-CM | POA: Diagnosis not present

## 2019-08-28 DIAGNOSIS — I25118 Atherosclerotic heart disease of native coronary artery with other forms of angina pectoris: Secondary | ICD-10-CM | POA: Diagnosis not present

## 2019-08-28 DIAGNOSIS — Z8249 Family history of ischemic heart disease and other diseases of the circulatory system: Secondary | ICD-10-CM | POA: Insufficient documentation

## 2019-08-28 DIAGNOSIS — E785 Hyperlipidemia, unspecified: Secondary | ICD-10-CM | POA: Diagnosis not present

## 2019-08-28 DIAGNOSIS — F329 Major depressive disorder, single episode, unspecified: Secondary | ICD-10-CM | POA: Insufficient documentation

## 2019-08-28 DIAGNOSIS — Z823 Family history of stroke: Secondary | ICD-10-CM | POA: Insufficient documentation

## 2019-08-28 DIAGNOSIS — I10 Essential (primary) hypertension: Secondary | ICD-10-CM | POA: Diagnosis present

## 2019-08-28 DIAGNOSIS — Z8673 Personal history of transient ischemic attack (TIA), and cerebral infarction without residual deficits: Secondary | ICD-10-CM | POA: Diagnosis not present

## 2019-08-28 DIAGNOSIS — I2510 Atherosclerotic heart disease of native coronary artery without angina pectoris: Secondary | ICD-10-CM | POA: Diagnosis present

## 2019-08-28 HISTORY — DX: Atherosclerotic heart disease of native coronary artery without angina pectoris: I25.10

## 2019-08-28 HISTORY — PX: LEFT HEART CATH AND CORONARY ANGIOGRAPHY: CATH118249

## 2019-08-28 HISTORY — PX: CORONARY STENT INTERVENTION: CATH118234

## 2019-08-28 HISTORY — PX: CORONARY PRESSURE/FFR STUDY: CATH118243

## 2019-08-28 LAB — POCT ACTIVATED CLOTTING TIME
Activated Clotting Time: 290 seconds
Activated Clotting Time: 549 seconds

## 2019-08-28 SURGERY — LEFT HEART CATH AND CORONARY ANGIOGRAPHY
Anesthesia: LOCAL

## 2019-08-28 MED ORDER — ADULT MULTIVITAMIN W/MINERALS CH
1.0000 | ORAL_TABLET | Freq: Every day | ORAL | Status: DC
  Administered 2019-08-29: 1 via ORAL
  Filled 2019-08-28: qty 1

## 2019-08-28 MED ORDER — TICAGRELOR 90 MG PO TABS: 90.0000 mg | ORAL_TABLET | ORAL | Status: DC

## 2019-08-28 MED ORDER — METOPROLOL SUCCINATE ER 25 MG PO TB24
25.0000 mg | ORAL_TABLET | Freq: Every day | ORAL | Status: DC
  Administered 2019-08-29: 25 mg via ORAL
  Filled 2019-08-28: qty 1

## 2019-08-28 MED ORDER — ISOSORBIDE MONONITRATE ER 30 MG PO TB24
30.0000 mg | ORAL_TABLET | Freq: Every day | ORAL | Status: DC
  Administered 2019-08-29: 30 mg via ORAL
  Filled 2019-08-28: qty 1

## 2019-08-28 MED ORDER — TICAGRELOR 90 MG PO TABS: 90.0000 mg | ORAL_TABLET | Freq: Two times a day (BID) | ORAL | Status: DC

## 2019-08-28 MED ORDER — LISINOPRIL 40 MG PO TABS
40.0000 mg | ORAL_TABLET | Freq: Every day | ORAL | Status: DC
  Administered 2019-08-29: 40 mg via ORAL
  Filled 2019-08-28: qty 1

## 2019-08-28 MED ORDER — ATORVASTATIN CALCIUM 40 MG PO TABS
40.0000 mg | ORAL_TABLET | Freq: Every day | ORAL | Status: DC
  Administered 2019-08-29: 40 mg via ORAL
  Filled 2019-08-28: qty 1

## 2019-08-28 MED ORDER — MIDAZOLAM HCL 2 MG/2ML IJ SOLN
INTRAMUSCULAR | Status: DC | PRN
  Administered 2019-08-28: 1 mg via INTRAVENOUS
  Administered 2019-08-28: 2 mg via INTRAVENOUS

## 2019-08-28 MED ORDER — SODIUM CHLORIDE 0.9 % IV SOLN: 250.0000 mL | INTRAVENOUS | Status: DC | PRN

## 2019-08-28 MED ORDER — SODIUM CHLORIDE 0.9 % WEIGHT BASED INFUSION
3.0000 mL/kg/h | INTRAVENOUS | Status: DC
  Administered 2019-08-28: 3 mL/kg/h via INTRAVENOUS

## 2019-08-28 MED ORDER — FENTANYL CITRATE (PF) 100 MCG/2ML IJ SOLN
INTRAMUSCULAR | Status: DC | PRN
  Administered 2019-08-28: 25 ug via INTRAVENOUS
  Administered 2019-08-28: 25 ug

## 2019-08-28 MED ORDER — ASPIRIN 81 MG PO CHEW: 81.0000 mg | CHEWABLE_TABLET | ORAL | Status: DC

## 2019-08-28 MED ORDER — SODIUM CHLORIDE 0.9% FLUSH: 3.0000 mL | INTRAVENOUS | Status: DC | PRN

## 2019-08-28 MED ORDER — HEPARIN (PORCINE) IN NACL 1000-0.9 UT/500ML-% IV SOLN
INTRAVENOUS | Status: AC
  Filled 2019-08-28: qty 500

## 2019-08-28 MED ORDER — LIDOCAINE HCL (PF) 1 % IJ SOLN
INTRAMUSCULAR | Status: AC
  Filled 2019-08-28: qty 30

## 2019-08-28 MED ORDER — HYDRALAZINE HCL 20 MG/ML IJ SOLN: 10.0000 mg | INTRAMUSCULAR | Status: AC | PRN

## 2019-08-28 MED ORDER — VERAPAMIL HCL 2.5 MG/ML IV SOLN
INTRAVENOUS | Status: AC
  Filled 2019-08-28: qty 2

## 2019-08-28 MED ORDER — FENTANYL CITRATE (PF) 100 MCG/2ML IJ SOLN
INTRAMUSCULAR | Status: AC
  Filled 2019-08-28: qty 2

## 2019-08-28 MED ORDER — MIDAZOLAM HCL 2 MG/2ML IJ SOLN
INTRAMUSCULAR | Status: DC | PRN
  Administered 2019-08-28: 1 mg via INTRAVENOUS

## 2019-08-28 MED ORDER — CITALOPRAM HYDROBROMIDE 20 MG PO TABS: 40.0000 mg | ORAL_TABLET | Freq: Every day | ORAL | Status: DC

## 2019-08-28 MED ORDER — NITROGLYCERIN 0.4 MG SL SUBL: 0.4000 mg | SUBLINGUAL_TABLET | SUBLINGUAL | Status: DC | PRN

## 2019-08-28 MED ORDER — HEPARIN (PORCINE) IN NACL 1000-0.9 UT/500ML-% IV SOLN
INTRAVENOUS | Status: DC | PRN
  Administered 2019-08-28 (×2): 500 mL

## 2019-08-28 MED ORDER — MIDAZOLAM HCL 2 MG/2ML IJ SOLN
INTRAMUSCULAR | Status: AC
  Filled 2019-08-28: qty 2

## 2019-08-28 MED ORDER — HEPARIN SODIUM (PORCINE) 1000 UNIT/ML IJ SOLN
INTRAMUSCULAR | Status: AC
  Filled 2019-08-28: qty 1

## 2019-08-28 MED ORDER — SODIUM CHLORIDE 0.9% FLUSH
3.0000 mL | Freq: Two times a day (BID) | INTRAVENOUS | Status: DC
  Administered 2019-08-28 – 2019-08-29 (×2): 3 mL via INTRAVENOUS

## 2019-08-28 MED ORDER — LIDOCAINE HCL (PF) 1 % IJ SOLN
INTRAMUSCULAR | Status: DC | PRN
  Administered 2019-08-28: 2 mL via INTRADERMAL

## 2019-08-28 MED ORDER — HEPARIN SODIUM (PORCINE) 1000 UNIT/ML IJ SOLN
INTRAMUSCULAR | Status: DC | PRN
  Administered 2019-08-28: 2000 [IU] via INTRAVENOUS
  Administered 2019-08-28 (×2): 4500 [IU] via INTRAVENOUS

## 2019-08-28 MED ORDER — FENTANYL CITRATE (PF) 100 MCG/2ML IJ SOLN
INTRAMUSCULAR | Status: DC | PRN
  Administered 2019-08-28 (×2): 25 ug via INTRAVENOUS

## 2019-08-28 MED ORDER — ACETAMINOPHEN 325 MG PO TABS
650.0000 mg | ORAL_TABLET | ORAL | Status: DC | PRN
  Administered 2019-08-28: 650 mg via ORAL
  Filled 2019-08-28: qty 2

## 2019-08-28 MED ORDER — LOPERAMIDE HCL 2 MG PO CAPS
4.0000 mg | ORAL_CAPSULE | Freq: Once | ORAL | Status: DC
  Filled 2019-08-28: qty 2

## 2019-08-28 MED ORDER — SODIUM CHLORIDE 0.9% FLUSH
3.0000 mL | Freq: Two times a day (BID) | INTRAVENOUS | Status: DC
  Administered 2019-08-28: 3 mL via INTRAVENOUS

## 2019-08-28 MED ORDER — SODIUM CHLORIDE 0.9 % IV SOLN: INTRAVENOUS | Status: AC

## 2019-08-28 MED ORDER — TICAGRELOR 90 MG PO TABS
90.0000 mg | ORAL_TABLET | Freq: Two times a day (BID) | ORAL | Status: DC
  Administered 2019-08-28 – 2019-08-29 (×2): 90 mg via ORAL
  Filled 2019-08-28 (×2): qty 1

## 2019-08-28 MED ORDER — FLUOXETINE HCL 40 MG PO CAPS: 40.0000 mg | ORAL_CAPSULE | Freq: Two times a day (BID) | ORAL | Status: DC

## 2019-08-28 MED ORDER — SODIUM CHLORIDE 0.9 % WEIGHT BASED INFUSION: 1.0000 mL/kg/h | INTRAVENOUS | Status: DC

## 2019-08-28 MED ORDER — ASPIRIN 81 MG PO CHEW
81.0000 mg | CHEWABLE_TABLET | Freq: Every day | ORAL | Status: DC
  Administered 2019-08-29: 81 mg via ORAL
  Filled 2019-08-28: qty 1

## 2019-08-28 MED ORDER — VERAPAMIL HCL 2.5 MG/ML IV SOLN
INTRAVENOUS | Status: DC | PRN
  Administered 2019-08-28: 10 mL via INTRA_ARTERIAL

## 2019-08-28 MED ORDER — LABETALOL HCL 5 MG/ML IV SOLN: 10.0000 mg | INTRAVENOUS | Status: AC | PRN

## 2019-08-28 MED ORDER — ONDANSETRON HCL 4 MG/2ML IJ SOLN: 4.0000 mg | Freq: Four times a day (QID) | INTRAMUSCULAR | Status: DC | PRN

## 2019-08-28 MED ORDER — NITROGLYCERIN 1 MG/10 ML FOR IR/CATH LAB
INTRA_ARTERIAL | Status: AC
  Filled 2019-08-28: qty 10

## 2019-08-28 MED ORDER — IOHEXOL 350 MG/ML SOLN
INTRAVENOUS | Status: DC | PRN
  Administered 2019-08-28: 155 mL via INTRA_ARTERIAL

## 2019-08-28 SURGICAL SUPPLY — 24 items
BALLN SAPPHIRE 2.0X15 (BALLOONS) ×2
BALLN SAPPHIRE 2.5X15 (BALLOONS) ×2
BALLN SAPPHIRE ~~LOC~~ 3.0X15 (BALLOONS) ×2 IMPLANT
BALLOON SAPPHIRE 2.0X15 (BALLOONS) ×1 IMPLANT
BALLOON SAPPHIRE 2.5X15 (BALLOONS) ×1 IMPLANT
CATH 5FR JL3.5 JR4 ANG PIG MP (CATHETERS) ×2 IMPLANT
CATH LAUNCHER 6FR EBU 3.5 SH (CATHETERS) ×2 IMPLANT
DEVICE RAD COMP TR BAND LRG (VASCULAR PRODUCTS) ×2 IMPLANT
GLIDESHEATH SLEND SS 6F .021 (SHEATH) ×2 IMPLANT
GUIDEWIRE INQWIRE 1.5J.035X260 (WIRE) ×1 IMPLANT
GUIDEWIRE PRESSURE COMET II (WIRE) ×2 IMPLANT
INQWIRE 1.5J .035X260CM (WIRE) ×2
KIT ENCORE 26 ADVANTAGE (KITS) ×2 IMPLANT
KIT HEART LEFT (KITS) ×2 IMPLANT
KIT HEMO VALVE WATCHDOG (MISCELLANEOUS) ×2 IMPLANT
PACK CARDIAC CATHETERIZATION (CUSTOM PROCEDURE TRAY) ×2 IMPLANT
STENT SYNERGY XD 2.25X16 (Permanent Stent) ×1 IMPLANT
STENT SYNERGY XD 2.75X28 (Permanent Stent) ×1 IMPLANT
SYNERGY XD 2.25X16 (Permanent Stent) ×2 IMPLANT
SYNERGY XD 2.75X28 (Permanent Stent) ×2 IMPLANT
TRANSDUCER W/STOPCOCK (MISCELLANEOUS) ×2 IMPLANT
TUBING CIL FLEX 10 FLL-RA (TUBING) ×2 IMPLANT
WIRE ASAHI PROWATER 180CM (WIRE) ×4 IMPLANT
WIRE FIGHTER CROSSING 190CM (WIRE) ×2 IMPLANT

## 2019-08-28 NOTE — Progress Notes (Signed)
Pt leaves cath lab holding area in stable condition. Rt radial site is unremarkable. Rt radial pulse is 2+. !2cc in tr band and site is CDI. No bruising or hematoma.

## 2019-08-28 NOTE — Interval H&P Note (Signed)
Cath Lab Visit (complete for each Cath Lab visit)  Clinical Evaluation Leading to the Procedure:   ACS: No.  Non-ACS:    Anginal Classification: CCS III  Anti-ischemic medical therapy: Minimal Therapy (1 class of medications)  Non-Invasive Test Results: No non-invasive testing performed  Prior CABG: No previous CABG      History and Physical Interval Note:  08/28/2019 1:26 PM  Daniel [name]  has presented today for surgery, with the diagnosis of CAD.  The various methods of treatment have been discussed with the patient and family. After consideration of risks, benefits and other options for treatment, the patient has consented to  Procedure(s): LEFT HEART CATH AND CORONARY ANGIOGRAPHY (N/A) as a surgical intervention.  The patient's history has been reviewed, patient examined, no change in status, stable for surgery.  I have reviewed the patient's chart and labs.  Questions were answered to the patient's satisfaction.     Larae Grooms

## 2019-08-28 NOTE — Progress Notes (Addendum)
Client c/o "watery diarrhea" for a week 10 times last night, states " I feel like I am going to pass out" and c/o gas pains; and c/o shortness of breath; Anderson Malta O'Neal,RN notified and she will notify Dr Irish Lack

## 2019-08-28 NOTE — Progress Notes (Signed)
Patient transferred from cath lab at 1643hrs.  Oriented to unit and plan of care for shift.  Given post cath instructions, verbalized understanding. TR band in place, +2 radial pulse.  Wife at bedside.

## 2019-08-29 DIAGNOSIS — Z8673 Personal history of transient ischemic attack (TIA), and cerebral infarction without residual deficits: Secondary | ICD-10-CM | POA: Diagnosis not present

## 2019-08-29 DIAGNOSIS — I10 Essential (primary) hypertension: Secondary | ICD-10-CM | POA: Diagnosis not present

## 2019-08-29 DIAGNOSIS — Z955 Presence of coronary angioplasty implant and graft: Secondary | ICD-10-CM | POA: Diagnosis not present

## 2019-08-29 DIAGNOSIS — Z86718 Personal history of other venous thrombosis and embolism: Secondary | ICD-10-CM | POA: Diagnosis not present

## 2019-08-29 DIAGNOSIS — F329 Major depressive disorder, single episode, unspecified: Secondary | ICD-10-CM | POA: Diagnosis not present

## 2019-08-29 DIAGNOSIS — E785 Hyperlipidemia, unspecified: Secondary | ICD-10-CM | POA: Diagnosis not present

## 2019-08-29 DIAGNOSIS — Z79899 Other long term (current) drug therapy: Secondary | ICD-10-CM | POA: Diagnosis not present

## 2019-08-29 DIAGNOSIS — F17200 Nicotine dependence, unspecified, uncomplicated: Secondary | ICD-10-CM | POA: Diagnosis not present

## 2019-08-29 DIAGNOSIS — Z8249 Family history of ischemic heart disease and other diseases of the circulatory system: Secondary | ICD-10-CM | POA: Diagnosis not present

## 2019-08-29 DIAGNOSIS — I25118 Atherosclerotic heart disease of native coronary artery with other forms of angina pectoris: Secondary | ICD-10-CM

## 2019-08-29 DIAGNOSIS — Z823 Family history of stroke: Secondary | ICD-10-CM | POA: Diagnosis not present

## 2019-08-29 DIAGNOSIS — I6523 Occlusion and stenosis of bilateral carotid arteries: Secondary | ICD-10-CM | POA: Diagnosis not present

## 2019-08-29 LAB — BASIC METABOLIC PANEL
Anion gap: 8 (ref 5–15)
BUN: 13 mg/dL (ref 8–23)
CO2: 18 mmol/L — ABNORMAL LOW (ref 22–32)
Calcium: 8.5 mg/dL — ABNORMAL LOW (ref 8.9–10.3)
Chloride: 112 mmol/L — ABNORMAL HIGH (ref 98–111)
Creatinine, Ser: 0.83 mg/dL (ref 0.61–1.24)
GFR calc Af Amer: >60 mL/min (ref >60)
GFR calc non Af Amer: >60 mL/min (ref >60)
Glucose, Bld: 92 mg/dL (ref 70–99)
Potassium: 3.9 mmol/L (ref 3.5–5.1)
Sodium: 138 mmol/L (ref 135–145)

## 2019-08-29 LAB — CBC
HCT: 40.5 % (ref 39.0–52.0)
Hemoglobin: 13.6 g/dL (ref 13.0–17.0)
MCH: 29.3 pg (ref 26.0–34.0)
MCHC: 33.6 g/dL (ref 30.0–36.0)
MCV: 87.3 fL (ref 80.0–100.0)
Platelets: 189 10*3/uL (ref 150–400)
RBC: 4.64 MIL/uL (ref 4.22–5.81)
RDW: 14.2 % (ref 11.5–15.5)
WBC: 6.6 10*3/uL (ref 4.0–10.5)
nRBC: 0 % (ref 0.0–0.2)

## 2019-08-29 MED ORDER — ASPIRIN 81 MG PO CHEW: 81.0000 mg | CHEWABLE_TABLET | Freq: Every day | ORAL | 1 refills | Status: DC

## 2019-08-29 MED FILL — Nitroglycerin IV Soln 100 MCG/ML in D5W: INTRA_ARTERIAL | Qty: 10 | Status: AC

## 2019-08-29 NOTE — Progress Notes (Signed)
CARDIAC REHAB PHASE I   PRE:  Rate/Rhythm: 31 SB  BP:  Supine: 119/73  Sitting:   Standing:    SaO2: 97%RA  MODE:  Ambulation: 470 ft   POST:  Rate/Rhythm: 78 SR  BP:  Supine:   Sitting: 134/83  Standing:    SaO2: 97%RA [id number] Came earlier and pt had not had breakfast and stated he had tried to call kitchen several times. Gave pt coffee, cereal,milk and juice and told him I would return. Came back and pt ready to walk. Walked 470 ft on RA with no CP. Education completed with pt who voiced understanding. Stressed importance of brilinta which pt stated he has already been on. Reviewed NTG use, smoking cessation (gave Handout and encouraged to call 1800quitnow if needed), walking for ex, gave heart healthy diet, and discussed CRP 2. Pt has attended CRP 2 [place] before and stated he really enjoyed and would like to attend again. Pt is interested in participating in Virtual Cardiac and Pulmonary Rehab. Pt advised that Virtual Cardiac and Pulmonary Rehab is provided at no cost to the patient.  Checklist:  1. Pt has smart device  ie smartphone and/or ipad for downloading an app  Yes 2. Reliable internet/wifi service    Yes 3. Understands how to use their smartphone and navigate within an app.  No      Since CVA, pt stated his wife has kept up with his medical information and she might want him to have and help him with it   Pt verbalized understanding and is in agreement.    Graylon Good, RN BSN  08/29/2019 9:24 AM

## 2019-08-29 NOTE — Progress Notes (Addendum)
The patient has been seen in conjunction with Cecilie Kicks, NP.Marland Kitchen All aspects of care have been considered and discussed. The patient has been personally interviewed, examined, and all clinical data has been reviewed.   Right radial cath site is unremarkable.  Post cath PCI images were reviewed and demonstrate an excellent result.  No clinical symptoms overnight.  Walking with cardiac rehab.  Plan discharge today with follow-up with Dr. Lendell Caprice to continue preventive/risk factor modification.   Progress Note  Patient Name: Daniel [name] Date of Encounter: 08/29/2019  Primary Cardiologist: Larae Grooms, MD   Subjective   No pain, "I feel better since I had breakfast"  Inpatient Medications    Scheduled Meds: . aspirin  81 mg Oral Daily  . atorvastatin  40 mg Oral Daily  . isosorbide mononitrate  30 mg Oral Daily  . lisinopril  40 mg Oral Daily  . metoprolol succinate  25 mg Oral Daily  . multivitamin with minerals  1 tablet Oral Daily  . sodium chloride flush  3 mL Intravenous Q12H  . sodium chloride flush  3 mL Intravenous Q12H  . ticagrelor  90 mg Oral BID   Continuous Infusions: . sodium chloride     PRN Meds: sodium chloride, acetaminophen, nitroGLYCERIN, ondansetron (ZOFRAN) IV, sodium chloride flush   Vital Signs    Vitals:   08/28/19 2127 08/29/19 0016 08/29/19 0441 08/29/19 0740  BP: 121/68 106/85 118/64 99/64  Pulse: 64 61 66 63  Resp: 16 20 17 17   Temp: 98.4 F (36.9 C) 98.2 F (36.8 C) 98 F (36.7 C) 98 F (36.7 C)  TempSrc: Oral Oral Oral Oral  SpO2: 96% 96% 98%   Weight:   90.2 kg   Height:        Intake/Output Summary (Last 24 hours) at 08/29/2019 0820 Last data filed at 08/28/2019 1900 Gross per 24 hour  Intake 540 ml  Output 750 ml  Net -210 ml   Last 3 Weights 08/29/2019 08/28/2019 08/23/2019  Weight (lbs) 198 lb 13.7 oz 204 lb 204 lb  Weight (kg) 90.2 kg 92.534 kg 92.534 kg      Telemetry    SR - Personally Reviewed  ECG     SR with flipped T wave in III otherwise no change from 08/23/19 - Personally Reviewed  Physical Exam   GEN: No acute distress.   Neck: No JVD Cardiac: RRR, no murmurs, rubs, or gallops. Rt wrist without hematoma Respiratory: Clear to auscultation bilaterally. GI: Soft, nontender, non-distended  MS: No edema; No deformity. Neuro:  Nonfocal  Psych: Normal affect   Labs    High Sensitivity Troponin:  No results for input(s): TROPONINIHS in the last 720 hours.    Chemistry Recent Labs  Lab 08/23/19 1616  NA 143  K 4.4  CL 104  CO2 24  GLUCOSE 79  BUN 18  CREATININE 0.93  CALCIUM 8.8  GFRNONAA 84  GFRAA 97     Hematology Recent Labs  Lab 08/23/19 1616 08/29/19 0420  WBC 7.6 6.6  RBC 5.18 4.64  HGB 15.2 13.6  HCT 45.6 40.5  MCV 88 87.3  MCH 29.3 29.3  MCHC 33.3 33.6  RDW 13.3 14.2  PLT 197 189    BNPNo results for input(s): BNP, PROBNP in the last 168 hours.   DDimer No results for input(s): DDIMER in the last 168 hours.   Radiology    CARDIAC CATHETERIZATION  Result Date: 08/29/2019  Ramus lesion is 99% stenosed. This  is relatively a small vessel with TIMI 2 flow. There are right to left collaterals.  Ost LAD to Prox LAD lesion is 50% stenosed. This appeared unchanged.  Previously placed Mid LAD-1 stent (unknown type) is widely patent.  Past the prior LAD stent, mid LAD-2 lesion is 70% stenosed.  A drug-eluting stent was successfully placed using a SYNERGY XD 2.75X28.  Post intervention, there is a 0% residual stenosis.  Dist LAD-1 lesion is 80% stenosed just past the sharp bend. This appeared to be dissection related to wire manipulation when more proximal stent could not be delivered.  A drug-eluting stent was successfully placed using a SYNERGY XD 2.25X16.  Post intervention, there is a 0% residual stenosis.  Dist LAD-2 lesion is 50% stenosed.  Ost RCA to Prox RCA stent has a 40% ostial stenosis.  The left ventricular systolic function is normal.   LV end diastolic pressure is normal.  The left ventricular ejection fraction is 50-55% by visual estimate.  There is no aortic valve stenosis.  EBU 3.75 guide would be a better choice given his anatomy.  Guide support was a problem during this procedure.  The sharp bend in the mid LAD was also an issue but now this is stented.  I explained the findings to the wife.  If he has more angina, could bring him back and try to intervene upon the ramus vessel.  For now, would plan for medical therapy.    Cardiac Studies   Cardiac cath 08/28/19  Ramus lesion is 99% stenosed. This is relatively a small vessel with TIMI 2 flow. There are right to left collaterals.  Ost LAD to Prox LAD lesion is 50% stenosed. This appeared unchanged.  Previously placed Mid LAD-1 stent (unknown type) is widely patent.  Past the prior LAD stent, mid LAD-2 lesion is 70% stenosed.  A drug-eluting stent was successfully placed using a SYNERGY XD 2.75X28.  Post intervention, there is a 0% residual stenosis.  Dist LAD-1 lesion is 80% stenosed just past the sharp bend. This appeared to be dissection related to wire manipulation when more proximal stent could not be delivered.  A drug-eluting stent was successfully placed using a SYNERGY XD 2.25X16.  Post intervention, there is a 0% residual stenosis.  Dist LAD-2 lesion is 50% stenosed.  Ost RCA to Prox RCA stent has a 40% ostial stenosis.  The left ventricular systolic function is normal.  LV end diastolic pressure is normal.  The left ventricular ejection fraction is 50-55% by visual estimate.  There is no aortic valve stenosis.   EBU 3.75 guide would be a better choice given his anatomy.  Guide support was a problem during this procedure.  The sharp bend in the mid LAD was also an issue but now this is stented.  I explained the findings to the wife.  If he has more angina, could bring him back and try to intervene upon the ramus vessel.  For now, would plan for  medical therapy.   Diagnostic Dominance: Right  Intervention    Patient Profile     69 y.o. male with tobacco use, HTN, CVA, CAD with prior stents to RCA, mLAD 2016, hx PE and embolectomy now with increased fatigue with exertion. Brought in for cardiac cath.     Assessment & Plan    Increasing angina with known CAD - PCI to LAD mid and distal. On ASA ACE, BB and imdur  And now brilinta. Cardiac rehab to see and ambulate and then discharge.  He has follow up with Dr. Irish Lack   CAD see above  HTN controlled from 118/64 to 99/64   HLD   Last lipids 12/2018 with LDL 64 and HDL 29 on lipitor 40   Tobacco use, recommend stopping, he has Chantix at home and will begin.   For questions or updates, please contact [place] Please consult [email or web address] for contact info under        Signed, Cecilie Kicks, NP  08/29/2019, 8:20 AM
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
## 2019-08-30 ENCOUNTER — Telehealth (HOSPITAL_COMMUNITY): Payer: Self-pay

## 2019-08-30 NOTE — Telephone Encounter (Signed)
Pt insurance is active and benefits verified through [facility]. Co-pay $0.00, DED $3,300.00/$482.55 met, out of pocket $6,600.00/$482.55 met, co-insurance 10%. No pre-authorization required. Passport, 08/30/19 @ 8:58AM, DJS#[id number]  2ndary insurance is active and benefits verified through [employer]/B. Co-pay $0.00, DED $203.00/$0.00 met, out of pocket $0.00/$0.00 met, co-insurance 20%. No pre-authorization required. Passport, 08/30/19 @ 9:09AM, XAJ#[id number]  Will contact patient to see if he is interested in the Cardiac Rehab Program. If interested, patient will need to complete follow up appt. Once completed, patient will be contacted for scheduling upon review by the RN Navigator.

## 2019-08-30 NOTE — Telephone Encounter (Signed)
Called patient to see if he is interested in the Cardiac Rehab Program. Patient expressed interest. Explained scheduling process and went over insurance, patient verbalized understanding. Will contact patient for scheduling once f/u has been completed.  °
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
## 2019-09-18 ENCOUNTER — Other Ambulatory Visit: Payer: Self-pay

## 2019-09-18 ENCOUNTER — Ambulatory Visit (INDEPENDENT_AMBULATORY_CARE_PROVIDER_SITE_OTHER): Payer: 59 | Admitting: Interventional Cardiology

## 2019-09-18 ENCOUNTER — Encounter: Payer: Self-pay | Admitting: Interventional Cardiology

## 2019-09-18 VITALS — BP 134/64 | HR 71 | Ht 71.0 in | Wt 205.2 lb

## 2019-09-18 DIAGNOSIS — I2510 Atherosclerotic heart disease of native coronary artery without angina pectoris: Secondary | ICD-10-CM

## 2019-09-18 DIAGNOSIS — E782 Mixed hyperlipidemia: Secondary | ICD-10-CM

## 2019-09-18 DIAGNOSIS — I10 Essential (primary) hypertension: Secondary | ICD-10-CM

## 2019-09-18 DIAGNOSIS — Z720 Tobacco use: Secondary | ICD-10-CM | POA: Diagnosis not present

## 2019-09-18 NOTE — Patient Instructions (Addendum)
Medication Instructions:  Your physician recommends that you continue on your current medications as directed. Please refer to the Current Medication list given to you today.  *If you need a refill on your cardiac medications before your next appointment, please call your pharmacy*   Lab Work: None ordered  If you have labs (blood work) drawn today and your tests are completely normal, you will receive your results only by: Marland Kitchen MyChart Message (if you have MyChart) OR . A paper copy in the mail If you have any lab test that is abnormal or we need to change your treatment, we will call you to review the results.   Testing/Procedures: None ordered   Follow-Up: Follow up with Dr. Irish Lack on 12/21/19 at 3:00 PM  Other Instructions

## 2019-09-21 ENCOUNTER — Encounter (HOSPITAL_COMMUNITY): Payer: Self-pay

## 2019-09-21 ENCOUNTER — Telehealth (HOSPITAL_COMMUNITY): Payer: Self-pay

## 2019-09-21 NOTE — Telephone Encounter (Signed)
Attempted to call patient in regards to Cardiac Rehab - Vm full, unable to leave VM  Mailed letter 

## 2019-10-05 ENCOUNTER — Telehealth (HOSPITAL_COMMUNITY): Payer: Self-pay

## 2019-10-05 NOTE — Telephone Encounter (Signed)
No response from pt regarding CR.  Closed referral.  

## 2019-12-21 ENCOUNTER — Encounter: Payer: Self-pay | Admitting: Interventional Cardiology

## 2019-12-21 ENCOUNTER — Ambulatory Visit (INDEPENDENT_AMBULATORY_CARE_PROVIDER_SITE_OTHER): Payer: 59 | Admitting: Interventional Cardiology

## 2019-12-21 ENCOUNTER — Other Ambulatory Visit: Payer: Self-pay

## 2019-12-21 VITALS — BP 120/60 | HR 77 | Ht 71.0 in | Wt 203.0 lb

## 2019-12-21 DIAGNOSIS — Z9861 Coronary angioplasty status: Secondary | ICD-10-CM | POA: Diagnosis not present

## 2019-12-21 DIAGNOSIS — I2510 Atherosclerotic heart disease of native coronary artery without angina pectoris: Secondary | ICD-10-CM | POA: Diagnosis not present

## 2019-12-21 DIAGNOSIS — I10 Essential (primary) hypertension: Secondary | ICD-10-CM | POA: Diagnosis not present

## 2019-12-21 DIAGNOSIS — E782 Mixed hyperlipidemia: Secondary | ICD-10-CM | POA: Diagnosis not present

## 2019-12-21 MED ORDER — NITROGLYCERIN 0.4 MG SL SUBL: 0.4000 mg | SUBLINGUAL_TABLET | SUBLINGUAL | 3 refills | Status: DC | PRN

## 2019-12-21 MED ORDER — ISOSORBIDE MONONITRATE ER 30 MG PO TB24: 30.0000 mg | ORAL_TABLET | Freq: Every day | ORAL | 3 refills | Status: DC

## 2019-12-21 NOTE — Patient Instructions (Signed)
Medication Instructions:  Your physician recommends that you continue on your current medications as directed. Please refer to the Current Medication list given to you today.   RESTART: isosorbide mononitrate (imdur) 30 mg tablet: Take 1 tablet by mouth once a day  *If you need a refill on your cardiac medications before your next appointment, please call your pharmacy*   Lab Work: TODAY: CBC, BMET If you have labs (blood work) drawn today and your tests are completely normal, you will receive your results only by: Marland Kitchen MyChart Message (if you have MyChart) OR . A paper copy in the mail If you have any lab test that is abnormal or we need to change your treatment, we will call you to review the results.   Testing/Procedures: Your physician has requested that you have a cardiac catheterization. Cardiac catheterization is used to diagnose and/or treat various heart conditions. Doctors may recommend this procedure for a number of different reasons. The most common reason is to evaluate chest pain. Chest pain can be a symptom of coronary artery disease (CAD), and cardiac catheterization can show whether plaque is narrowing or blocking your heart's arteries. This procedure is also used to evaluate the valves, as well as measure the blood flow and oxygen levels in different parts of your heart. For further information please visit [email or web address]. Please follow instruction sheet, as given.  Follow-Up: Follow up with Dr. Irish Lack on 01/31/20 at 11:40 AM  Other Instructions    [facility] OFFICE [place], [address] [place] [address] Dept: [phone] Loc: [place]  12/21/2019  You are scheduled for a Cardiac Catheterization on Wednesday, September 1 with Dr. Larae Grooms.  1. Please arrive at the [facility] (Main Entrance A) at [facility]: [address]  [place], [place] [address] at 8:00 AM (This time is two hours before your procedure to ensure your preparation). Free valet parking service is available.   Special note: Every effort is made to have your procedure done on time. Please understand that emergencies sometimes delay scheduled procedures.  2. Diet: Do not eat solid foods after midnight.  The patient may have clear liquids until 5am upon the day of the procedure.  3. Labs: CBC/BMET today  Due to recent COVID-19 restrictions implemented by our local and state authorities and in an effort to keep both patients and staff as safe as possible, our hospital system requires COVID-19 testing prior to certain scheduled hospital procedures.  Please go to [facility]. [place], [place] [address] on 01/07/20 at 3:00 PM.  This is a drive up testing site.  You will not need to exit your vehicle.  You will not be billed at the time of testing but may receive a bill later depending on your insurance. You must agree to self-quarantine from the time of your testing until the procedure date on 01/09/20.  This should included staying home with ONLY the people you live with.  Avoid take-out, grocery store shopping or leaving the house for any non-emergent reason.  Failure to have your COVID-19 test done on the date and time you have been scheduled will result in cancellation of your procedure.  Please call our office at [phone] if you have any questions.  4. Medication instructions in preparation for your procedure:   Contrast Allergy: No   On the morning of your procedure, take your Aspirin and Brilinta/Ticagrelor and any regular morning  medicines.  You may use sips of water.  5. Plan for one night stay--bring personal belongings. 6. Bring a current list of your medications and current insurance cards. 7. You MUST have a responsible person to drive you home. 8. Someone MUST be with you the first 24 hours after you arrive home or your discharge will be  delayed. 9. Please wear clothes that are easy to get on and off and wear slip-on shoes.  Thank you for allowing [place] to care for you!   -- [facility]
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
## 2019-12-22 LAB — BASIC METABOLIC PANEL
BUN/Creatinine Ratio: 22 (ref 10–24)
BUN: 22 mg/dL (ref 8–27)
CO2: 25 mmol/L (ref 20–29)
Calcium: 9.3 mg/dL (ref 8.6–10.2)
Chloride: 103 mmol/L (ref 96–106)
Creatinine, Ser: 1.01 mg/dL (ref 0.76–1.27)
GFR calc Af Amer: 88 mL/min/{1.73_m2} (ref >59)
GFR calc non Af Amer: 76 mL/min/{1.73_m2} (ref >59)
Glucose: 99 mg/dL (ref 65–99)
Potassium: 4.3 mmol/L (ref 3.5–5.2)
Sodium: 140 mmol/L (ref 134–144)

## 2019-12-22 LAB — CBC
Hematocrit: 37 % — ABNORMAL LOW (ref 37.5–51.0)
Hemoglobin: 12.4 g/dL — ABNORMAL LOW (ref 13.0–17.7)
MCH: 29.7 pg (ref 26.6–33.0)
MCHC: 33.5 g/dL (ref 31.5–35.7)
MCV: 89 fL (ref 79–97)
Platelets: 219 10*3/uL (ref 150–450)
RBC: 4.17 x10E6/uL (ref 4.14–5.80)
RDW: 13.2 % (ref 11.6–15.4)
WBC: 7.9 10*3/uL (ref 3.4–10.8)

## 2019-12-24 ENCOUNTER — Telehealth: Payer: Self-pay | Admitting: Interventional Cardiology

## 2019-12-24 NOTE — Telephone Encounter (Signed)
Patient called and wanted to report that he donated blood 2 weeks ago.

## 2019-12-24 NOTE — Telephone Encounter (Signed)
Cleon Gustin, RN  12/24/2019 4:37 PM EDT Back to Top    The patient has been notified of the result and verbalized understanding. Denies having any blood in urine or stool. He has not had a colonoscopy or a hemoccult test done. All questions (if any) were answered. Cleon Gustin, RN 12/24/2019 4:34 PM    Cleon Gustin, RN  12/24/2019 3:25 PM EDT     Left message for patient to call back.   Jettie Booze, MD  12/24/2019 1:03 PM EDT     Hemoglobin slightly decreased. Has he had a recent colonoscopy or hemoccult test?

## 2019-12-24 NOTE — Telephone Encounter (Signed)
° ° °  Pt is returning call from [place] for lab results

## 2019-12-24 NOTE — Telephone Encounter (Signed)
    Pt is calling back he said he had a question he forgot to ask

## 2020-01-04 ENCOUNTER — Telehealth: Payer: Self-pay | Admitting: Interventional Cardiology

## 2020-01-04 DIAGNOSIS — I2510 Atherosclerotic heart disease of native coronary artery without angina pectoris: Secondary | ICD-10-CM

## 2020-01-04 DIAGNOSIS — R079 Chest pain, unspecified: Secondary | ICD-10-CM

## 2020-01-04 DIAGNOSIS — Z01812 Encounter for preprocedural laboratory examination: Secondary | ICD-10-CM

## 2020-01-04 NOTE — Telephone Encounter (Signed)
Patient calling to reschedule his catheterization. He states he also has questions about the procedure.

## 2020-01-04 NOTE — Telephone Encounter (Signed)
Pt returned this office call and stated he would like to cancel and r/s his procedure please.  Pt is awaiting a call back.    Stated thanks!

## 2020-01-04 NOTE — Telephone Encounter (Signed)
Attempted to contact patient but there was no answer and VM was full. ?

## 2020-01-04 NOTE — Telephone Encounter (Signed)
Called and spoke to patient. He states that he wants to postpone his cath. He states that his wife is starting a new job and does not want to take time off the first week. Explained the importance of not delaying care. Patient still wishes to reschedule. Patient's cath rescheduled for 9/20. Patient will have EKG, repeat labs, and COVID test done on 9/17. Follow up appointment scheduled for 10/18. Dr. Irish Lack will do the H&P the morning of the procedure. Reviewed cath instruction below with the patient. Instructed him to let us know if his Sx change or worsen prior to that time. He verbalized understanding and thanked me for the call.   [facility] OFF[facility] [place], [place]  [place] [address] Dept: [phone] Loc: [phone]  PRESSLEY TADESSE                      01/04/2020  You are scheduled for a Cardiac Catheterization on Monday, September 20 with Dr. Larae Grooms.  1. Please arrive at the [facility] (Main Entrance A) at [facility]: [address] [place], [place] [address] at 5:30 AM (This time is two hours before your procedure to ensure your preparation). Free valet parking service is available.   Special note: Every effort is made to have your procedure done on time. Please understand that emergencies sometimes delay scheduled procedures.  2. Diet: Do not eat solid foods after midnight.  The patient may have clear liquids until 5am upon the day of the procedure.  3. Labs: CBC/BMET/EKG 01/25/20 at 12:00 PM  Due to recent COVID-19 restrictions implemented by our local and state authorities and in an effort to keep both patients and staff as safe as possible, our hospital system requires COVID-19 testing prior to certain scheduled hospital procedures.  Please go to [facility]. [place], [place] [address] on 01/25/20 at 1:10 PM.  This is a drive up testing site.  You will not need  to exit your vehicle.  You will not be billed at the time of testing but may receive a bill later depending on your insurance. You must agree to self-quarantine from the time of your testing until the procedure date on 01/28/20.  This should included staying home with ONLY the people you live with.  Avoid take-out, grocery store shopping or leaving the house for any non-emergent reason.  Failure to have your COVID-19 test done on the date and time you have been scheduled will result in cancellation of your procedure.  Please call our office at [phone] if you have any questions.  4. Medication instructions in preparation for your procedure:   Contrast Allergy: No   On the morning of your procedure, take your Aspirin and Brilinta/Ticagrelor and any regular morning medicines.  You may use sips of water.  5. Plan for one night stay--bring personal belongings. 6. Bring a current list of your medications and current insurance cards. 7. You MUST have a responsible person to drive you home. 8. Someone MUST be with you the first 24 hours after you arrive home or your discharge will be delayed. 9. Please wear clothes that are easy to get on and off and wear slip-on shoes.  Thank you for allowing [place] to care for you!   -- [facility]

## 2020-01-07 ENCOUNTER — Other Ambulatory Visit (HOSPITAL_COMMUNITY): Payer: Medicare Other

## 2020-01-18 ENCOUNTER — Other Ambulatory Visit: Payer: Self-pay | Admitting: Interventional Cardiology

## 2020-01-18 DIAGNOSIS — E785 Hyperlipidemia, unspecified: Secondary | ICD-10-CM

## 2020-01-25 ENCOUNTER — Ambulatory Visit (INDEPENDENT_AMBULATORY_CARE_PROVIDER_SITE_OTHER): Payer: 59

## 2020-01-25 ENCOUNTER — Other Ambulatory Visit: Payer: Medicare Other | Admitting: *Deleted

## 2020-01-25 ENCOUNTER — Other Ambulatory Visit (HOSPITAL_COMMUNITY): Payer: Medicare Other

## 2020-01-25 ENCOUNTER — Other Ambulatory Visit (HOSPITAL_COMMUNITY)
Admission: RE | Admit: 2020-01-25 | Discharge: 2020-01-25 | Disposition: A | Discharge status: Home / Self Care | Payer: No Typology Code available for payment source | Source: Ambulatory Visit | Attending: Interventional Cardiology | Admitting: Interventional Cardiology

## 2020-01-25 ENCOUNTER — Other Ambulatory Visit: Payer: Self-pay

## 2020-01-25 VITALS — HR 50 | Ht 71.0 in | Wt 206.0 lb

## 2020-01-25 DIAGNOSIS — I10 Essential (primary) hypertension: Secondary | ICD-10-CM | POA: Diagnosis present

## 2020-01-25 DIAGNOSIS — Z7982 Long term (current) use of aspirin: Secondary | ICD-10-CM | POA: Diagnosis not present

## 2020-01-25 DIAGNOSIS — Z79899 Other long term (current) drug therapy: Secondary | ICD-10-CM | POA: Diagnosis not present

## 2020-01-25 DIAGNOSIS — Z823 Family history of stroke: Secondary | ICD-10-CM | POA: Diagnosis not present

## 2020-01-25 DIAGNOSIS — F0631 Mood disorder due to known physiological condition with depressive features: Secondary | ICD-10-CM | POA: Diagnosis present

## 2020-01-25 DIAGNOSIS — R062 Wheezing: Secondary | ICD-10-CM | POA: Diagnosis not present

## 2020-01-25 DIAGNOSIS — I6523 Occlusion and stenosis of bilateral carotid arteries: Secondary | ICD-10-CM | POA: Diagnosis present

## 2020-01-25 DIAGNOSIS — I083 Combined rheumatic disorders of mitral, aortic and tricuspid valves: Secondary | ICD-10-CM | POA: Diagnosis not present

## 2020-01-25 DIAGNOSIS — J811 Chronic pulmonary edema: Secondary | ICD-10-CM | POA: Diagnosis not present

## 2020-01-25 DIAGNOSIS — Z01810 Encounter for preprocedural cardiovascular examination: Secondary | ICD-10-CM | POA: Diagnosis not present

## 2020-01-25 DIAGNOSIS — Z01818 Encounter for other preprocedural examination: Secondary | ICD-10-CM | POA: Diagnosis not present

## 2020-01-25 DIAGNOSIS — J9811 Atelectasis: Secondary | ICD-10-CM | POA: Diagnosis not present

## 2020-01-25 DIAGNOSIS — Z20822 Contact with and (suspected) exposure to covid-19: Secondary | ICD-10-CM | POA: Diagnosis present

## 2020-01-25 DIAGNOSIS — Z8249 Family history of ischemic heart disease and other diseases of the circulatory system: Secondary | ICD-10-CM | POA: Diagnosis not present

## 2020-01-25 DIAGNOSIS — E8779 Other fluid overload: Secondary | ICD-10-CM | POA: Diagnosis not present

## 2020-01-25 DIAGNOSIS — Z720 Tobacco use: Secondary | ICD-10-CM | POA: Diagnosis not present

## 2020-01-25 DIAGNOSIS — R079 Chest pain, unspecified: Secondary | ICD-10-CM

## 2020-01-25 DIAGNOSIS — D62 Acute posthemorrhagic anemia: Secondary | ICD-10-CM | POA: Diagnosis not present

## 2020-01-25 DIAGNOSIS — I517 Cardiomegaly: Secondary | ICD-10-CM | POA: Diagnosis not present

## 2020-01-25 DIAGNOSIS — I25110 Atherosclerotic heart disease of native coronary artery with unstable angina pectoris: Secondary | ICD-10-CM | POA: Diagnosis present

## 2020-01-25 DIAGNOSIS — I4819 Other persistent atrial fibrillation: Secondary | ICD-10-CM | POA: Diagnosis not present

## 2020-01-25 DIAGNOSIS — Z01812 Encounter for preprocedural laboratory examination: Secondary | ICD-10-CM

## 2020-01-25 DIAGNOSIS — F17210 Nicotine dependence, cigarettes, uncomplicated: Secondary | ICD-10-CM | POA: Diagnosis present

## 2020-01-25 DIAGNOSIS — J90 Pleural effusion, not elsewhere classified: Secondary | ICD-10-CM | POA: Diagnosis not present

## 2020-01-25 DIAGNOSIS — R519 Headache, unspecified: Secondary | ICD-10-CM | POA: Diagnosis present

## 2020-01-25 DIAGNOSIS — X58XXXA Exposure to other specified factors, initial encounter: Secondary | ICD-10-CM | POA: Diagnosis present

## 2020-01-25 DIAGNOSIS — I2510 Atherosclerotic heart disease of native coronary artery without angina pectoris: Secondary | ICD-10-CM

## 2020-01-25 DIAGNOSIS — Z85828 Personal history of other malignant neoplasm of skin: Secondary | ICD-10-CM | POA: Diagnosis not present

## 2020-01-25 DIAGNOSIS — E782 Mixed hyperlipidemia: Secondary | ICD-10-CM | POA: Diagnosis not present

## 2020-01-25 DIAGNOSIS — Z91013 Allergy to seafood: Secondary | ICD-10-CM | POA: Diagnosis not present

## 2020-01-25 DIAGNOSIS — E785 Hyperlipidemia, unspecified: Secondary | ICD-10-CM | POA: Diagnosis present

## 2020-01-25 DIAGNOSIS — F4310 Post-traumatic stress disorder, unspecified: Secondary | ICD-10-CM | POA: Diagnosis present

## 2020-01-25 DIAGNOSIS — Z8673 Personal history of transient ischemic attack (TIA), and cerebral infarction without residual deficits: Secondary | ICD-10-CM | POA: Diagnosis not present

## 2020-01-25 DIAGNOSIS — Y712 Prosthetic and other implants, materials and accessory cardiovascular devices associated with adverse incidents: Secondary | ICD-10-CM | POA: Diagnosis present

## 2020-01-25 DIAGNOSIS — I200 Unstable angina: Secondary | ICD-10-CM | POA: Diagnosis not present

## 2020-01-25 DIAGNOSIS — T82855A Stenosis of coronary artery stent, initial encounter: Secondary | ICD-10-CM | POA: Diagnosis present

## 2020-01-25 DIAGNOSIS — S2243XD Multiple fractures of ribs, bilateral, subsequent encounter for fracture with routine healing: Secondary | ICD-10-CM | POA: Diagnosis not present

## 2020-01-25 DIAGNOSIS — F419 Anxiety disorder, unspecified: Secondary | ICD-10-CM | POA: Diagnosis present

## 2020-01-25 DIAGNOSIS — I700 Atherosclerosis of aorta: Secondary | ICD-10-CM | POA: Diagnosis not present

## 2020-01-25 DIAGNOSIS — Z7902 Long term (current) use of antithrombotics/antiplatelets: Secondary | ICD-10-CM | POA: Diagnosis not present

## 2020-01-25 NOTE — Progress Notes (Signed)
1.) Reason for visit: EKG for cath  2.) Name of MD requesting visit: Dr. Irish Lack  3.) H&P: CAD  4.) Assessment and plan per MD: Patient continues to have SOB, has not worsened. Will keep plan for Cath on Monday.

## 2020-01-26 LAB — CBC
Hematocrit: 37.4 % — ABNORMAL LOW (ref 37.5–51.0)
Hemoglobin: 13 g/dL (ref 13.0–17.7)
MCH: 30.9 pg (ref 26.6–33.0)
MCHC: 34.8 g/dL (ref 31.5–35.7)
MCV: 89 fL (ref 79–97)
Platelets: 186 10*3/uL (ref 150–450)
RBC: 4.21 x10E6/uL (ref 4.14–5.80)
RDW: 12.6 % (ref 11.6–15.4)
WBC: 6 10*3/uL (ref 3.4–10.8)

## 2020-01-26 LAB — BASIC METABOLIC PANEL
BUN/Creatinine Ratio: 17 (ref 10–24)
BUN: 14 mg/dL (ref 8–27)
CO2: 21 mmol/L (ref 20–29)
Calcium: 9.1 mg/dL (ref 8.6–10.2)
Chloride: 103 mmol/L (ref 96–106)
Creatinine, Ser: 0.82 mg/dL (ref 0.76–1.27)
GFR calc Af Amer: 105 mL/min/{1.73_m2} (ref >59)
GFR calc non Af Amer: 91 mL/min/{1.73_m2} (ref >59)
Glucose: 93 mg/dL (ref 65–99)
Potassium: 4.5 mmol/L (ref 3.5–5.2)
Sodium: 139 mmol/L (ref 134–144)

## 2020-01-26 LAB — SARS CORONAVIRUS 2 (TAT 6-24 HRS): SARS Coronavirus 2: NEGATIVE

## 2020-01-27 NOTE — H&P (Addendum)
Cath Lab Visit (complete for each Cath Lab visit)  Clinical Evaluation Leading to the Procedure:   ACS: No.  Non-ACS:    Anginal Classification: CCS III  Anti-ischemic medical therapy: Minimal Therapy (1 class of medications)  Non-Invasive Test Results: No non-invasive testing performed  Prior CABG: No previous CABG       Cardiology Admission History and Physical:   Patient ID: Daniel [name] MRN: [id number]; DOB: [birth year]   Admission date: 01/28/2020  Primary Care Provider: Lawerance Cruel, MD [facility] Cardiologist: Larae Grooms, MD [place] Electrophysiologist:  None   Chief Complaint:  angina  Patient Profile:   Daniel [name] is a 69 y.o. male with CAD/PCI  History of Present Illness:   Daniel [name] multiple prior PCI who has had progressive angina.  Most recent PCI in 4/21.    Residual subtotal occlusion of ramus.  He reports progressive exertional angina.  Cath has been delayed due to his schedule.     Past Medical History:  Diagnosis Date  . Basal cell carcinoma of nose ~ 2012  . Carotid stenosis    a. Neck CTA 3/16:  mild bilat ICA calcific plaque (nothing > 50%)  . Coronary artery disease   . CVA (cerebral vascular accident) ([place]) 07/2014   "left hand and part of that arm weak since" (09/24/2014)  . Depression    "since stroke in 07/2014"   . Essential hypertension   . Headache   . HLD (hyperlipidemia)   . Hx of echocardiogram    a.  Echo 3/16:  mild LVH, EF 40-45%, Gr 1 DD, mild LAE;  b.  TEE 3/16:  EF 55%, mild LAE, no LAA clot, neg bubble study    Past Surgical History:  Procedure Laterality Date  . CARDIAC CATHETERIZATION N/A 09/24/2014   Procedure: Left Heart Cath and Coronary Angiography;  Surgeon: Jettie Booze, MD;  Location: [place] CV LAB;  Service: Cardiovascular;  Laterality: N/A;  . CARDIAC CATHETERIZATION N/A 09/24/2014   Procedure: Coronary Stent Intervention;  Surgeon: Jettie Booze, MD;  Location:  [place] CV LAB;  Service: Cardiovascular;  Laterality: N/A;  . CARDIAC CATHETERIZATION N/A 09/25/2014   Procedure: Coronary Stent Intervention;  Surgeon: [name], MD;  Location: [place] CV LAB;  Service: Cardiovascular;  Laterality: N/A;  . CIRCUMCISION    . CORONARY STENT INTERVENTION  08/28/2019  . CORONARY STENT INTERVENTION N/A 08/28/2019   Procedure: CORONARY STENT INTERVENTION;  Surgeon: Jettie Booze, MD;  Location: [place] CV LAB;  Service: Cardiovascular;  Laterality: N/A;  . FRACTIONAL FLOW RESERVE WIRE  09/24/2014   Procedure: Fractional Flow Reserve Wire;  Surgeon: Jettie Booze, MD;  Location: [place] CV LAB;  Service: Cardiovascular;;  . INTRAVASCULAR PRESSURE WIRE/FFR STUDY N/A 08/28/2019   Procedure: INTRAVASCULAR PRESSURE WIRE/FFR STUDY;  Surgeon: Jettie Booze, MD;  Location: [place] CV LAB;  Service: Cardiovascular;  Laterality: N/A;  . LEFT HEART CATH AND CORONARY ANGIOGRAPHY N/A 08/28/2019   Procedure: LEFT HEART CATH AND CORONARY ANGIOGRAPHY;  Surgeon: Jettie Booze, MD;  Location: [place] CV LAB;  Service: Cardiovascular;  Laterality: N/A;  . LOOP RECORDER IMPLANT N/A 07/31/2014   Procedure: LOOP RECORDER IMPLANT;  Surgeon: [name], MD;  Location: [facility] CATH LAB;  Service: Cardiovascular;  Laterality: N/A;  . MOHS SURGERY  ~ 2012   "nose"  . TEE WITHOUT CARDIOVERSION N/A 07/31/2014   Procedure: TRANSESOPHAGEAL ECHOCARDIOGRAM (TEE);  Surgeon: Larey Dresser, MD;  Location:  [facility] ENDOSCOPY;  Service: Cardiovascular;  Laterality: N/A;     Medications Prior to Admission: Prior to Admission medications   Medication Sig Start Date End Date Taking? Authorizing Provider  APPLE CIDER VINEGAR PO Take 1 capsule by mouth daily.   Yes [provider]  aspirin 81 MG chewable tablet Chew 1 tablet (81 mg total) by mouth daily. 08/30/19  Yes Reino Bellis B, NP  atorvastatin (LIPITOR) 40 MG tablet TAKE 1 TABLET BY MOUTH  DAILY Patient taking differently: Take 40 mg by mouth daily.  01/21/20  Yes Jettie Booze, MD  BRILINTA 90 MG TABS tablet Take 90 mg by mouth 2 (two) times daily. 07/14/19  Yes [provider]  FLUoxetine (PROZAC) 40 MG capsule Take 40 mg by mouth 2 (two) times daily. 10/12/18  Yes [provider]  isosorbide mononitrate (IMDUR) 30 MG 24 hr tablet Take 1 tablet (30 mg total) by mouth daily. 12/21/19  Yes Jettie Booze, MD  lisinopril (ZESTRIL) 40 MG tablet Take 1 tablet (40 mg total) by mouth daily. 05/03/19  Yes Jettie Booze, MD  metoprolol succinate (TOPROL-XL) 25 MG 24 hr tablet Take 1 tablet (25 mg total) by mouth daily. 05/03/19  Yes Jettie Booze, MD  nitroGLYCERIN (NITROSTAT) 0.4 MG SL tablet Place 1 tablet (0.4 mg total) under the tongue every 5 (five) minutes as needed for chest pain. 12/21/19  Yes Jettie Booze, MD     Allergies:    Allergies  Allergen Reactions  . Shrimp [Shellfish Allergy] Other (See Comments)    "heart attack symptoms", "chest pain, numbness in arms" about 20 years ago    Social History:   Social History   Socioeconomic History  . Marital status: Married    Spouse name: Not on file  . Number of children: 1  . Years of education: 63  . Highest education level: Not on file  Occupational History  . Occupation: own transportation company  Tobacco Use  . Smoking status: Current Every Day Smoker    Packs/day: 0.50    Years: 46.00    Pack years: 23.00    Types: Cigarettes  . Smokeless tobacco: Never Used  . Tobacco comment: Has rx for Chantix from Dr Gretta Cool  Vaping Use  . Vaping Use: Never used  Substance and Sexual Activity  . Alcohol use: Yes    Comment: 01/2017 Entered IOP for alcohol use disorder; Sober since 07/2016  . Drug use: No  . Sexual activity: Not Currently  Other Topics Concern  . Not on file  Social History Narrative   Married   Right handed   Caffeine use- 1 cup coffee daily   Social  Determinants of Health   Financial Resource Strain:   . Difficulty of Paying Living Expenses: Not on file  Food Insecurity:   . Worried About Charity fundraiser in the Last Year: Not on file  . Ran Out of Food in the Last Year: Not on file  Transportation Needs:   . Lack of Transportation (Medical): Not on file  . Lack of Transportation (Non-Medical): Not on file  Physical Activity:   . Days of Exercise per Week: Not on file  . Minutes of Exercise per Session: Not on file  Stress:   . Feeling of Stress : Not on file  Social Connections:   . Frequency of Communication with Friends and Family: Not on file  . Frequency of Social Gatherings with Friends and Family: Not on file  .  Attends Religious Services: Not on file  . Active Member of Clubs or Organizations: Not on file  . Attends Archivist Meetings: Not on file  . Marital Status: Not on file  Intimate Partner Violence:   . Fear of Current or Ex-Partner: Not on file  . Emotionally Abused: Not on file  . Physically Abused: Not on file  . Sexually Abused: Not on file    Family History:   The patient's family history includes Heart attack in his brother, father, and mother; Heart failure in his brother, father, and paternal uncle; Hypertension in his brother, father, mother, and paternal uncle; Stroke in his brother.    ROS:  Please see the history of present illness.  All other ROS reviewed and negative.     Physical Exam/Data:   Vitals:   01/28/20 0546  BP: 123/71  Pulse: 60  Resp: 16  Temp: 97.8 F (36.6 C)  TempSrc: Oral  SpO2: 100%  Weight: 91.6 kg  Height: 5\' 11"  (1.803 m)   No intake or output data in the 24 hours ending 01/28/20 0642 Last 3 Weights 01/28/2020 01/25/2020 12/21/2019  Weight (lbs) 202 lb 206 lb 203 lb  Weight (kg) 91.627 kg 93.441 kg 92.08 kg     Body mass index is 28.17 kg/m.  General:  Well nourished, well developed, in no acute distress HEENT: normal Lymph: no adenopathy Neck:  no JVD Endocrine:  No thryomegaly Vascular: No carotid bruits; FA pulses 2+ bilaterally without bruits  Cardiac:  normal S1, S2; RRR; no murmur  Lungs:  clear to auscultation bilaterally, no wheezing, rhonchi or rales  Abd: soft, nontender, no hepatomegaly  Ext: no edema Musculoskeletal:  No deformities, BUE and BLE strength normal and equal Skin: warm and dry  Neuro:  CNs 2-12 intact, no focal abnormalities noted Psych:  Normal affect    EKG:  The ECG that was done 9/17 was personally reviewed and demonstrates sinus bradycardia, no ST changes  Relevant CV Studies: 4/21 cath images reviewed in detail  Laboratory Data:  High Sensitivity Troponin:  No results for input(s): TROPONINIHS in the last 720 hours.    Chemistry Recent Labs  Lab 01/25/20 1137  NA 139  K 4.5  CL 103  CO2 21  GLUCOSE 93  BUN 14  CREATININE 0.82  CALCIUM 9.1  GFRNONAA 91  GFRAA 105    No results for input(s): PROT, ALBUMIN, AST, ALT, ALKPHOS, BILITOT in the last 168 hours. Hematology Recent Labs  Lab 01/25/20 1137  WBC 6.0  RBC 4.21  HGB 13.0  HCT 37.4*  MCV 89  MCH 30.9  MCHC 34.8  RDW 12.6  PLT 186   BNPNo results for input(s): BNP, PROBNP in the last 168 hours.  DDimer No results for input(s): DDIMER in the last 168 hours.   Radiology/Studies:  No results found.        Assessment and Plan:   1. CAD/angina:  Plan for repeat cath.  Risks and benefits of procedure explained to the patient. Residual disease in ramus.  ialso re-eval other vessels for new disease. 2. Needs secondary prevention including healthy diet and exercise, high dose statin.  Severity of Illness:   For questions or updates, please contact [place] Please consult [email or web address] for contact info under     Signed, Larae Grooms, MD  01/28/2020 6:42 AM

## 2020-01-28 ENCOUNTER — Inpatient Hospital Stay (HOSPITAL_COMMUNITY)
Admission: RE | Admit: 2020-01-28 | Discharge: 2020-02-06 | DRG: 229 | Disposition: A | Discharge status: Home / Self Care | Payer: No Typology Code available for payment source | Attending: Cardiothoracic Surgery | Admitting: Cardiothoracic Surgery

## 2020-01-28 ENCOUNTER — Inpatient Hospital Stay (HOSPITAL_COMMUNITY): Payer: No Typology Code available for payment source

## 2020-01-28 ENCOUNTER — Other Ambulatory Visit: Payer: Self-pay | Admitting: *Deleted

## 2020-01-28 ENCOUNTER — Other Ambulatory Visit: Payer: Self-pay

## 2020-01-28 ENCOUNTER — Encounter (HOSPITAL_COMMUNITY)
Admission: RE | Disposition: A | Discharge status: Home / Self Care | Payer: Self-pay | Source: Home / Self Care | Attending: Cardiothoracic Surgery

## 2020-01-28 ENCOUNTER — Encounter (HOSPITAL_COMMUNITY): Payer: Self-pay | Admitting: Interventional Cardiology

## 2020-01-28 DIAGNOSIS — R519 Headache, unspecified: Secondary | ICD-10-CM | POA: Diagnosis present

## 2020-01-28 DIAGNOSIS — Z8673 Personal history of transient ischemic attack (TIA), and cerebral infarction without residual deficits: Secondary | ICD-10-CM | POA: Diagnosis not present

## 2020-01-28 DIAGNOSIS — R062 Wheezing: Secondary | ICD-10-CM | POA: Diagnosis not present

## 2020-01-28 DIAGNOSIS — Z7982 Long term (current) use of aspirin: Secondary | ICD-10-CM | POA: Diagnosis not present

## 2020-01-28 DIAGNOSIS — F0631 Mood disorder due to known physiological condition with depressive features: Secondary | ICD-10-CM | POA: Diagnosis present

## 2020-01-28 DIAGNOSIS — I200 Unstable angina: Secondary | ICD-10-CM | POA: Diagnosis not present

## 2020-01-28 DIAGNOSIS — Z79899 Other long term (current) drug therapy: Secondary | ICD-10-CM

## 2020-01-28 DIAGNOSIS — F17210 Nicotine dependence, cigarettes, uncomplicated: Secondary | ICD-10-CM | POA: Diagnosis present

## 2020-01-28 DIAGNOSIS — E8779 Other fluid overload: Secondary | ICD-10-CM | POA: Diagnosis not present

## 2020-01-28 DIAGNOSIS — Y712 Prosthetic and other implants, materials and accessory cardiovascular devices associated with adverse incidents: Secondary | ICD-10-CM | POA: Diagnosis present

## 2020-01-28 DIAGNOSIS — I25110 Atherosclerotic heart disease of native coronary artery with unstable angina pectoris: Secondary | ICD-10-CM | POA: Diagnosis not present

## 2020-01-28 DIAGNOSIS — Z7902 Long term (current) use of antithrombotics/antiplatelets: Secondary | ICD-10-CM

## 2020-01-28 DIAGNOSIS — Z85828 Personal history of other malignant neoplasm of skin: Secondary | ICD-10-CM

## 2020-01-28 DIAGNOSIS — F419 Anxiety disorder, unspecified: Secondary | ICD-10-CM | POA: Diagnosis present

## 2020-01-28 DIAGNOSIS — Z20822 Contact with and (suspected) exposure to covid-19: Secondary | ICD-10-CM | POA: Diagnosis present

## 2020-01-28 DIAGNOSIS — D62 Acute posthemorrhagic anemia: Secondary | ICD-10-CM | POA: Diagnosis not present

## 2020-01-28 DIAGNOSIS — I6523 Occlusion and stenosis of bilateral carotid arteries: Secondary | ICD-10-CM | POA: Diagnosis present

## 2020-01-28 DIAGNOSIS — Z91013 Allergy to seafood: Secondary | ICD-10-CM | POA: Diagnosis not present

## 2020-01-28 DIAGNOSIS — I10 Essential (primary) hypertension: Secondary | ICD-10-CM | POA: Diagnosis present

## 2020-01-28 DIAGNOSIS — I2510 Atherosclerotic heart disease of native coronary artery without angina pectoris: Secondary | ICD-10-CM | POA: Diagnosis not present

## 2020-01-28 DIAGNOSIS — F4310 Post-traumatic stress disorder, unspecified: Secondary | ICD-10-CM | POA: Diagnosis present

## 2020-01-28 DIAGNOSIS — T82855A Stenosis of coronary artery stent, initial encounter: Secondary | ICD-10-CM | POA: Diagnosis present

## 2020-01-28 DIAGNOSIS — E785 Hyperlipidemia, unspecified: Secondary | ICD-10-CM

## 2020-01-28 DIAGNOSIS — Z823 Family history of stroke: Secondary | ICD-10-CM

## 2020-01-28 DIAGNOSIS — Z9889 Other specified postprocedural states: Secondary | ICD-10-CM

## 2020-01-28 DIAGNOSIS — X58XXXA Exposure to other specified factors, initial encounter: Secondary | ICD-10-CM | POA: Diagnosis present

## 2020-01-28 DIAGNOSIS — Z01818 Encounter for other preprocedural examination: Secondary | ICD-10-CM

## 2020-01-28 DIAGNOSIS — Z8249 Family history of ischemic heart disease and other diseases of the circulatory system: Secondary | ICD-10-CM | POA: Diagnosis not present

## 2020-01-28 DIAGNOSIS — I4819 Other persistent atrial fibrillation: Secondary | ICD-10-CM | POA: Diagnosis not present

## 2020-01-28 DIAGNOSIS — Z01810 Encounter for preprocedural cardiovascular examination: Secondary | ICD-10-CM

## 2020-01-28 DIAGNOSIS — E782 Mixed hyperlipidemia: Secondary | ICD-10-CM | POA: Diagnosis not present

## 2020-01-28 DIAGNOSIS — Z951 Presence of aortocoronary bypass graft: Secondary | ICD-10-CM

## 2020-01-28 DIAGNOSIS — Z720 Tobacco use: Secondary | ICD-10-CM | POA: Diagnosis not present

## 2020-01-28 HISTORY — PX: CORONARY PRESSURE/FFR STUDY: CATH118243

## 2020-01-28 HISTORY — PX: LEFT HEART CATH AND CORONARY ANGIOGRAPHY: CATH118249

## 2020-01-28 LAB — GLUCOSE, CAPILLARY: Glucose-Capillary: 106 mg/dL — ABNORMAL HIGH (ref 70–99)

## 2020-01-28 LAB — PULMONARY FUNCTION TEST
FEF 25-75 Pre: 1.32 L/s
FEF2575-%Pred-Pre: 49 %
FEV1-%Pred-Pre: 53 %
FEV1-Pre: 1.86 L
FEV1FVC-%Pred-Pre: 98 %
FEV6-%Pred-Pre: 57 %
FEV6-Pre: 2.56 L
FEV6FVC-%Pred-Pre: 105 %
FVC-%Pred-Pre: 54 %
FVC-Pre: 2.56 L
Pre FEV1/FVC ratio: 73 %
Pre FEV6/FVC Ratio: 100 %

## 2020-01-28 LAB — ECHOCARDIOGRAM COMPLETE
Area-P 1/2: 3.77 cm2
Calc EF: 58.5 %
Height: 71 in
S' Lateral: 3.3 cm
Single Plane A2C EF: 63.7 %
Single Plane A4C EF: 57.7 %
Weight: 3232 oz

## 2020-01-28 LAB — POCT ACTIVATED CLOTTING TIME: Activated Clotting Time: 263 s

## 2020-01-28 SURGERY — LEFT HEART CATH AND CORONARY ANGIOGRAPHY
Anesthesia: LOCAL

## 2020-01-28 MED ORDER — SODIUM CHLORIDE 0.9% FLUSH: 3.0000 mL | INTRAVENOUS | Status: DC | PRN

## 2020-01-28 MED ORDER — ISOSORBIDE MONONITRATE ER 30 MG PO TB24
30.0000 mg | ORAL_TABLET | Freq: Every day | ORAL | Status: DC
  Administered 2020-01-29 – 2020-01-30 (×2): 30 mg via ORAL
  Filled 2020-01-28 (×2): qty 1

## 2020-01-28 MED ORDER — SODIUM CHLORIDE 0.9 % IV SOLN: INTRAVENOUS | Status: AC

## 2020-01-28 MED ORDER — LIDOCAINE HCL (PF) 1 % IJ SOLN
INTRAMUSCULAR | Status: AC
  Filled 2020-01-28: qty 30

## 2020-01-28 MED ORDER — SODIUM CHLORIDE 0.9 % WEIGHT BASED INFUSION: 3.0000 mL/kg/h | INTRAVENOUS | Status: DC

## 2020-01-28 MED ORDER — HEPARIN (PORCINE) IN NACL 1000-0.9 UT/500ML-% IV SOLN
INTRAVENOUS | Status: DC | PRN
  Administered 2020-01-28 (×2): 500 mL

## 2020-01-28 MED ORDER — FENTANYL CITRATE (PF) 100 MCG/2ML IJ SOLN
INTRAMUSCULAR | Status: AC
  Filled 2020-01-28: qty 2

## 2020-01-28 MED ORDER — IOHEXOL 350 MG/ML SOLN
INTRAVENOUS | Status: DC | PRN
  Administered 2020-01-28: 130 mL

## 2020-01-28 MED ORDER — METOPROLOL SUCCINATE ER 25 MG PO TB24
25.0000 mg | ORAL_TABLET | Freq: Every day | ORAL | Status: DC
  Administered 2020-01-29 – 2020-01-30 (×2): 25 mg via ORAL
  Filled 2020-01-28 (×2): qty 1

## 2020-01-28 MED ORDER — SODIUM CHLORIDE 0.9% FLUSH
3.0000 mL | Freq: Two times a day (BID) | INTRAVENOUS | Status: DC
  Administered 2020-01-28: 3 mL via INTRAVENOUS

## 2020-01-28 MED ORDER — LISINOPRIL 20 MG PO TABS
40.0000 mg | ORAL_TABLET | Freq: Every day | ORAL | Status: DC
  Administered 2020-01-29 – 2020-01-30 (×2): 40 mg via ORAL
  Filled 2020-01-28 (×2): qty 1

## 2020-01-28 MED ORDER — MIDAZOLAM HCL 2 MG/2ML IJ SOLN
INTRAMUSCULAR | Status: DC | PRN
  Administered 2020-01-28: 2 mg via INTRAVENOUS
  Administered 2020-01-28: 1 mg via INTRAVENOUS

## 2020-01-28 MED ORDER — ASPIRIN 81 MG PO CHEW: 81.0000 mg | CHEWABLE_TABLET | Freq: Every day | ORAL | Status: DC

## 2020-01-28 MED ORDER — NICOTINE 21 MG/24HR TD PT24
21.0000 mg | MEDICATED_PATCH | Freq: Every day | TRANSDERMAL | Status: DC
  Administered 2020-01-28 – 2020-01-30 (×3): 21 mg via TRANSDERMAL
  Filled 2020-01-28 (×3): qty 1

## 2020-01-28 MED ORDER — ONDANSETRON HCL 4 MG/2ML IJ SOLN: 4.0000 mg | Freq: Four times a day (QID) | INTRAMUSCULAR | Status: DC | PRN

## 2020-01-28 MED ORDER — LABETALOL HCL 5 MG/ML IV SOLN: 10.0000 mg | INTRAVENOUS | Status: AC | PRN

## 2020-01-28 MED ORDER — ATORVASTATIN CALCIUM 40 MG PO TABS
40.0000 mg | ORAL_TABLET | Freq: Every day | ORAL | Status: DC
  Administered 2020-01-28: 40 mg via ORAL
  Filled 2020-01-28: qty 1

## 2020-01-28 MED ORDER — HEPARIN SODIUM (PORCINE) 1000 UNIT/ML IJ SOLN
INTRAMUSCULAR | Status: AC
  Filled 2020-01-28: qty 1

## 2020-01-28 MED ORDER — HEPARIN (PORCINE) IN NACL 1000-0.9 UT/500ML-% IV SOLN
INTRAVENOUS | Status: AC
  Filled 2020-01-28: qty 1000

## 2020-01-28 MED ORDER — MIDAZOLAM HCL 2 MG/2ML IJ SOLN
INTRAMUSCULAR | Status: AC
  Filled 2020-01-28: qty 2

## 2020-01-28 MED ORDER — SODIUM CHLORIDE 0.9 % IV SOLN: 250.0000 mL | INTRAVENOUS | Status: DC | PRN

## 2020-01-28 MED ORDER — SODIUM CHLORIDE 0.9% FLUSH
3.0000 mL | Freq: Two times a day (BID) | INTRAVENOUS | Status: DC
  Administered 2020-01-28 – 2020-01-30 (×5): 3 mL via INTRAVENOUS

## 2020-01-28 MED ORDER — VERAPAMIL HCL 2.5 MG/ML IV SOLN
INTRAVENOUS | Status: DC | PRN
  Administered 2020-01-28: 10 mL via INTRA_ARTERIAL

## 2020-01-28 MED ORDER — LIDOCAINE HCL (PF) 1 % IJ SOLN
INTRAMUSCULAR | Status: DC | PRN
  Administered 2020-01-28: 5 mL

## 2020-01-28 MED ORDER — HYDRALAZINE HCL 20 MG/ML IJ SOLN: 10.0000 mg | INTRAMUSCULAR | Status: AC | PRN

## 2020-01-28 MED ORDER — TICAGRELOR 90 MG PO TABS: 90.0000 mg | ORAL_TABLET | ORAL | Status: DC

## 2020-01-28 MED ORDER — FLUOXETINE HCL 20 MG PO CAPS
40.0000 mg | ORAL_CAPSULE | Freq: Two times a day (BID) | ORAL | Status: DC
  Administered 2020-01-28 – 2020-02-06 (×18): 40 mg via ORAL
  Filled 2020-01-28 (×18): qty 2

## 2020-01-28 MED ORDER — ASPIRIN 81 MG PO CHEW
81.0000 mg | CHEWABLE_TABLET | Freq: Every day | ORAL | Status: DC
  Administered 2020-01-29 – 2020-01-30 (×2): 81 mg via ORAL
  Filled 2020-01-28 (×2): qty 1

## 2020-01-28 MED ORDER — HEPARIN SODIUM (PORCINE) 1000 UNIT/ML IJ SOLN
INTRAMUSCULAR | Status: DC | PRN
  Administered 2020-01-28 (×2): 5000 [IU] via INTRAVENOUS

## 2020-01-28 MED ORDER — NITROGLYCERIN 0.4 MG SL SUBL: 0.4000 mg | SUBLINGUAL_TABLET | SUBLINGUAL | Status: DC | PRN

## 2020-01-28 MED ORDER — ASPIRIN 81 MG PO CHEW: 81.0000 mg | CHEWABLE_TABLET | ORAL | Status: DC

## 2020-01-28 MED ORDER — VERAPAMIL HCL 2.5 MG/ML IV SOLN
INTRAVENOUS | Status: AC
  Filled 2020-01-28: qty 2

## 2020-01-28 MED ORDER — SODIUM CHLORIDE 0.9 % WEIGHT BASED INFUSION: 1.0000 mL/kg/h | INTRAVENOUS | Status: DC

## 2020-01-28 MED ORDER — FENTANYL CITRATE (PF) 100 MCG/2ML IJ SOLN
INTRAMUSCULAR | Status: DC | PRN
Start: 2020-01-28 — End: 2020-01-28
  Administered 2020-01-28 (×2): 25 ug via INTRAVENOUS

## 2020-01-28 MED ORDER — ACETAMINOPHEN 325 MG PO TABS
650.0000 mg | ORAL_TABLET | ORAL | Status: DC | PRN
  Administered 2020-01-28 – 2020-01-30 (×4): 650 mg via ORAL
  Filled 2020-01-28 (×4): qty 2

## 2020-01-28 SURGICAL SUPPLY — 16 items
CATH 5FR JL3.5 JR4 ANG PIG MP (CATHETERS) ×2 IMPLANT
CATH INFINITI 5 FR 3DRC (CATHETERS) ×2 IMPLANT
CATH INFINITI 5 FR AR1 MOD (CATHETERS) ×2 IMPLANT
CATH LAUNCHER 6FR EBU 3.75 (CATHETERS) ×2 IMPLANT
DEVICE RAD COMP TR BAND LRG (VASCULAR PRODUCTS) ×2 IMPLANT
GLIDESHEATH SLEND SS 6F .021 (SHEATH) ×2 IMPLANT
GUIDEWIRE INQWIRE 1.5J.035X260 (WIRE) ×1 IMPLANT
GUIDEWIRE PRESSURE COMET II (WIRE) ×2 IMPLANT
INQWIRE 1.5J .035X260CM (WIRE) ×2
KIT ESSENTIALS PG (KITS) ×2 IMPLANT
KIT HEART LEFT (KITS) ×2 IMPLANT
KIT HEMO VALVE WATCHDOG (MISCELLANEOUS) ×2 IMPLANT
PACK CARDIAC CATHETERIZATION (CUSTOM PROCEDURE TRAY) ×2 IMPLANT
SHEATH PROBE COVER 6X72 (BAG) ×2 IMPLANT
TRANSDUCER W/STOPCOCK (MISCELLANEOUS) ×2 IMPLANT
TUBING CIL FLEX 10 FLL-RA (TUBING) ×2 IMPLANT
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
## 2020-01-28 NOTE — Progress Notes (Signed)
  Echocardiogram 2D Echocardiogram has been performed.  Daniel [name] 01/28/2020, 3:36 PM

## 2020-01-28 NOTE — Progress Notes (Signed)
Pre Cabg study completed.   See CVProc for preliminary results.   Griffin Basil, RDMS, RVT

## 2020-01-28 NOTE — Interval H&P Note (Signed)
History and Physical Interval Note:  01/28/2020 7:35 AM  Daniel [name]  has presented today for surgery, with the diagnosis of cad - angina.  The various methods of treatment have been discussed with the patient and family. After consideration of risks, benefits and other options for treatment, the patient has consented to  Procedure(s): LEFT HEART CATH AND CORONARY ANGIOGRAPHY (N/A) as a surgical intervention.  The patient's history has been reviewed, patient examined, no change in status, stable for surgery.  I have reviewed the patient's chart and labs.  Questions were answered to the patient's satisfaction.     Larae Grooms

## 2020-01-29 ENCOUNTER — Encounter (HOSPITAL_COMMUNITY): Payer: Self-pay | Admitting: Interventional Cardiology

## 2020-01-29 LAB — CBC
HCT: 37.8 % — ABNORMAL LOW (ref 39.0–52.0)
Hemoglobin: 12.2 g/dL — ABNORMAL LOW (ref 13.0–17.0)
MCH: 29.3 pg (ref 26.0–34.0)
MCHC: 32.3 g/dL (ref 30.0–36.0)
MCV: 90.6 fL (ref 80.0–100.0)
Platelets: 165 10*3/uL (ref 150–400)
RBC: 4.17 MIL/uL — ABNORMAL LOW (ref 4.22–5.81)
RDW: 12.9 % (ref 11.5–15.5)
WBC: 4.8 10*3/uL (ref 4.0–10.5)
nRBC: 0 % (ref 0.0–0.2)

## 2020-01-29 LAB — BASIC METABOLIC PANEL
Anion gap: 9 (ref 5–15)
BUN: 10 mg/dL (ref 8–23)
CO2: 23 mmol/L (ref 22–32)
Calcium: 8.9 mg/dL (ref 8.9–10.3)
Chloride: 110 mmol/L (ref 98–111)
Creatinine, Ser: 0.83 mg/dL (ref 0.61–1.24)
GFR calc Af Amer: >60 mL/min (ref >60)
GFR calc non Af Amer: >60 mL/min (ref >60)
Glucose, Bld: 86 mg/dL (ref 70–99)
Potassium: 4.2 mmol/L (ref 3.5–5.1)
Sodium: 142 mmol/L (ref 135–145)

## 2020-01-29 MED ORDER — ATORVASTATIN CALCIUM 80 MG PO TABS
80.0000 mg | ORAL_TABLET | Freq: Every day | ORAL | Status: DC
  Administered 2020-01-29 – 2020-02-06 (×8): 80 mg via ORAL
  Filled 2020-01-29 (×8): qty 1

## 2020-01-29 MED ORDER — ENOXAPARIN SODIUM 40 MG/0.4ML ~~LOC~~ SOLN
40.0000 mg | SUBCUTANEOUS | Status: DC
  Administered 2020-01-29 – 2020-01-30 (×2): 40 mg via SUBCUTANEOUS
  Filled 2020-01-29 (×2): qty 0.4

## 2020-01-29 NOTE — Progress Notes (Addendum)
Progress Note  Patient Name: Daniel [name] Date of Encounter: 01/29/2020  [facility] Cardiologist: Larae Grooms, MD   Subjective   Doing well this morning, waiting to talk with surgeon. Mild episodes of chest pain at times.   Inpatient Medications    Scheduled Meds:  aspirin  81 mg Oral Daily   atorvastatin  40 mg Oral Daily   FLUoxetine  40 mg Oral BID   isosorbide mononitrate  30 mg Oral Daily   lisinopril  40 mg Oral Daily   metoprolol succinate  25 mg Oral Daily   nicotine  21 mg Transdermal Daily   sodium chloride flush  3 mL Intravenous Q12H   sodium chloride flush  3 mL Intravenous Q12H   Continuous Infusions:  sodium chloride     PRN Meds: sodium chloride, acetaminophen, nitroGLYCERIN, ondansetron (ZOFRAN) IV, sodium chloride flush   Vital Signs    Vitals:   01/28/20 0850 01/28/20 0918 01/28/20 1920 01/28/20 2008  BP: (!) 158/64 (!) 150/63  (!) 147/80  Pulse: (!) 50 (!) 48 80 68  Resp: 19 18 16 18   Temp:    97.8 F (36.6 C)  TempSrc:    Oral  SpO2: 100% 100% 97% 100%  Weight:      Height:        Intake/Output Summary (Last 24 hours) at 01/29/2020 0714 Last data filed at 01/29/2020 0345 Gross per 24 hour  Intake 500 ml  Output 580 ml  Net -80 ml   Last 3 Weights 01/28/2020 01/25/2020 12/21/2019  Weight (lbs) 202 lb 206 lb 203 lb  Weight (kg) 91.627 kg 93.441 kg 92.08 kg      Telemetry    NSR - Personally Reviewed  ECG    SR - Personally Reviewed  Physical Exam  Pleasant older WM GEN: No acute distress.   Neck: No JVD Cardiac: RRR, no murmurs, rubs, or gallops.  Respiratory: Clear to auscultation bilaterally. GI: Soft, nontender, non-distended  MS: No edema; No deformity. Right radial site stable.  Neuro:  Nonfocal  Psych: Normal affect   Labs    High Sensitivity Troponin:  No results for input(s): TROPONINIHS in the last 720 hours.    Chemistry Recent Labs  Lab 01/25/20 1137 01/29/20 0253  NA 139 142  K 4.5  4.2  CL 103 110  CO2 21 23  GLUCOSE 93 86  BUN 14 10  CREATININE 0.82 0.83  CALCIUM 9.1 8.9  GFRNONAA 91 >60  GFRAA 105 >60  ANIONGAP  --  9     Hematology Recent Labs  Lab 01/25/20 1137 01/29/20 0253  WBC 6.0 4.8  RBC 4.21 4.17*  HGB 13.0 12.2*  HCT 37.4* 37.8*  MCV 89 90.6  MCH 30.9 29.3  MCHC 34.8 32.3  RDW 12.6 12.9  PLT 186 165    BNPNo results for input(s): BNP, PROBNP in the last 168 hours.   DDimer No results for input(s): DDIMER in the last 168 hours.   Radiology    CARDIAC CATHETERIZATION  Addendum Date: 01/28/2020    Prox Cx to Mid Cx lesion is 75% stenosed.  2nd Mrg lesion is 70% stenosed.  1st Mrg lesion is 99% stenosed.  Ost LAD to Prox LAD lesion is 75% stenosed. Dist LAD lesion is 50% stenosed. Abnormal DFR indicating ischemia in LAD after the distal lesion.  Previously placed Mid LAD-1 stent (unknown type) is widely patent.  Previously placed Mid LAD-2 stent (unknown type) is widely patent.  Previously placed  Mid LAD to Dist LAD stent (unknown type) is widely patent.  Ost RCA stent with restenosis of 50% stenosed.  Prox RCA to Mid RCA lesion is 40% stenosed.  The left ventricular systolic function is normal.  LV end diastolic pressure is normal.  The left ventricular ejection fraction is 55-65% by visual estimate.  There is no aortic valve stenosis.  Worsening of symptoms.  Ostial LAD is worse than prior cath.  He has significant disease in the very tortuous circumflex system with some progression of disease in proximal vessel compared to prior cath.  Difficult to engage ostial RCA stent but there is some restenosis there as well.  Will get cardiac surgery consult for CABG given his worsening sx and high risk anatomy of left main equivalent. Will need time for Brilinta washout as well.  Results discussed with the wife.   Result Date: 01/28/2020  Prox Cx to Mid Cx lesion is 75% stenosed.  2nd Mrg lesion is 70% stenosed.  1st Mrg lesion is 99%  stenosed.  Ost LAD to Prox LAD lesion is 75% stenosed.  Previously placed Mid LAD-1 stent (unknown type) is widely patent.  Previously placed Mid LAD-2 stent (unknown type) is widely patent.  Previously placed Mid LAD to Dist LAD stent (unknown type) is widely patent.  Dist LAD lesion is 50% stenosed.  Ost RCA stent with restenosis of 50% stenosed.  Prox RCA to Mid RCA lesion is 40% stenosed.  The left ventricular systolic function is normal.  LV end diastolic pressure is normal.  The left ventricular ejection fraction is 55-65% by visual estimate.  There is no aortic valve stenosis.  Worsening of symptoms.  Ostial LAD is worse than prior cath.  He has significant disease in the very tortuous circumflex system with some progression of disease in proximal vessel compared to prior cath.  Difficult to engage ostial RCA stent but there is some restenosis there as well.  Will get cardiac surgery consult for CABG given his worsening sx and high risk anatomy of left main equivalent. Will need time for Brilinta washout as well.  Results discussed with the wife.   ECHOCARDIOGRAM COMPLETE  Result Date: 01/28/2020    ECHOCARDIOGRAM REPORT   Patient Name:   Daniel [name] Date of Exam: 01/28/2020 Medical Rec #:  [id number]     Height:       71.0 in Accession #:    [id number]    Weight:       202.0 lb Date of Birth:  07-20-1950     BSA:          2.117 m Patient Age:    69 years      BP:           150/63 mmHg Patient Gender: M             HR:           57 bpm. Exam Location:  Inpatient Procedure: 2D Echo, Cardiac Doppler and Color Doppler Indications:    CAD (coronary artery disease) [053976]                 Pre-op evaluation [[id number]]  History:        Patient has prior history of Echocardiogram examinations, most                 recent 07/29/2014. CAD, Stroke; Risk Factors:Hypertension,                 Dyslipidemia and Current  Smoker.  Sonographer:    Vickie Epley RDCS Referring Phys: [id number] BROADUS Z ATKINS  IMPRESSIONS  1. Left ventricular ejection fraction, by estimation, is 60 to 65%. The left ventricle has normal function. The left ventricle has no regional wall motion abnormalities. Left ventricular diastolic parameters were normal.  2. Right ventricular systolic function is normal. The right ventricular size is normal.  3. The mitral valve is normal in structure. No evidence of mitral valve regurgitation. No evidence of mitral stenosis.  4. The aortic valve is normal in structure. Aortic valve regurgitation is not visualized. No aortic stenosis is present.  5. The inferior vena cava is normal in size with greater than 50% respiratory variability, suggesting right atrial pressure of 3 mmHg. FINDINGS  Left Ventricle: Left ventricular ejection fraction, by estimation, is 60 to 65%. The left ventricle has normal function. The left ventricle has no regional wall motion abnormalities. The left ventricular internal cavity size was normal in size. There is  no left ventricular hypertrophy. Left ventricular diastolic parameters were normal. Normal left ventricular filling pressure. Right Ventricle: The right ventricular size is normal. No increase in right ventricular wall thickness. Right ventricular systolic function is normal. Left Atrium: Left atrial size was normal in size. Right Atrium: Right atrial size was normal in size. Pericardium: There is no evidence of pericardial effusion. Mitral Valve: The mitral valve is normal in structure. No evidence of mitral valve regurgitation. No evidence of mitral valve stenosis. Tricuspid Valve: The tricuspid valve is normal in structure. Tricuspid valve regurgitation is not demonstrated. No evidence of tricuspid stenosis. Aortic Valve: The aortic valve is normal in structure. Aortic valve regurgitation is not visualized. No aortic stenosis is present. Pulmonic Valve: The pulmonic valve was normal in structure. Pulmonic valve regurgitation is not visualized. No evidence of  pulmonic stenosis. Aorta: The aortic root is normal in size and structure. Venous: The inferior vena cava is normal in size with greater than 50% respiratory variability, suggesting right atrial pressure of 3 mmHg. IAS/Shunts: The interatrial septum appears to be lipomatous. No atrial level shunt detected by color flow Doppler.  LEFT VENTRICLE PLAX 2D LVIDd:         4.80 cm      Diastology LVIDs:         3.30 cm      LV e' medial:    8.05 cm/s LV PW:         0.90 cm      LV E/e' medial:  11.9 LV IVS:        0.90 cm      LV e' lateral:   9.57 cm/s LVOT diam:     2.40 cm      LV E/e' lateral: 10.0 LV SV:         69 LV SV Index:   33 LVOT Area:     4.52 cm  LV Volumes (MOD) LV vol d, MOD A2C: 125.0 ml LV vol d, MOD A4C: 150.0 ml LV vol s, MOD A2C: 45.4 ml LV vol s, MOD A4C: 63.5 ml LV SV MOD A2C:     79.6 ml LV SV MOD A4C:     150.0 ml LV SV MOD BP:      80.2 ml RIGHT VENTRICLE RV S prime:     15.10 cm/s TAPSE (M-mode): 2.2 cm LEFT ATRIUM             Index       RIGHT ATRIUM  Index LA diam:        3.50 cm 1.65 cm/m  RA Area:     11.80 cm LA Vol (A2C):   22.3 ml 10.53 ml/m RA Volume:   25.00 ml  11.81 ml/m LA Vol (A4C):   38.3 ml 18.09 ml/m LA Biplane Vol: 30.3 ml 14.31 ml/m  AORTIC VALVE LVOT Vmax:   77.00 cm/s LVOT Vmean:  54.600 cm/s LVOT VTI:    0.153 m  AORTA Ao Root diam: 3.40 cm MITRAL VALVE MV Area (PHT): 3.77 cm    SHUNTS MV Decel Time: 201 msec    Systemic VTI:  0.15 m MV E velocity: 96.00 cm/s  Systemic Diam: 2.40 cm MV A velocity: 92.10 cm/s MV E/A ratio:  1.04 Fransico Him MD Electronically signed by Fransico Him MD Signature Date/Time: 01/28/2020/3:47:43 PM    Final    VAS US DOPPLER PRE CABG  Result Date: 01/28/2020 PREOPERATIVE VASCULAR EVALUATION  Indications:  Pre-CABG. Risk Factors: Hypertension, hyperlipidemia. Performing Technologist: Abram Sander RVS Supporting Technologist: Griffin Basil RCT RDMS  Examination Guidelines: A complete evaluation includes B-mode imaging, spectral  Doppler, color Doppler, and power Doppler as needed of all accessible portions of each vessel. Bilateral testing is considered an integral part of a complete examination. Limited examinations for reoccurring indications may be performed as noted.  Right Carotid Findings: +----------+--------+--------+--------+---------------------+------------------+             PSV cm/s EDV cm/s Stenosis Describe              Comments            +----------+--------+--------+--------+---------------------+------------------+  CCA Prox   101      16                                                          +----------+--------+--------+--------+---------------------+------------------+  CCA Distal 86       21                                      intimal thickening  +----------+--------+--------+--------+---------------------+------------------+  ICA Prox   175      37       40-59%   calcific and                                                                     irregular                                 +----------+--------+--------+--------+---------------------+------------------+  ICA Distal 85       21                                                          +----------+--------+--------+--------+---------------------+------------------+  ECA  199      12                smooth and                                                                       homogeneous                               +----------+--------+--------+--------+---------------------+------------------+ Portions of this table do not appear on this page. +----------+--------+-------+--------+------------+             PSV cm/s EDV cms Describe Arm Pressure  +----------+--------+-------+--------+------------+  Subclavian 160      2                              +----------+--------+-------+--------+------------+ +---------+--------+--+--------+--+---------+  Vertebral PSV cm/s 56 EDV cm/s 21 Antegrade  +---------+--------+--+--------+--+---------+ Left Carotid  Findings: +----------+--------+--------+--------+-------------------+--------+             PSV cm/s EDV cm/s Stenosis Describe            Comments  +----------+--------+--------+--------+-------------------+--------+  CCA Prox   87       17                                              +----------+--------+--------+--------+-------------------+--------+  CCA Distal 85       15                                              +----------+--------+--------+--------+-------------------+--------+  ICA Prox   178      51       40-59%   calcific and smooth           +----------+--------+--------+--------+-------------------+--------+  ICA Distal 99       21                                              +----------+--------+--------+--------+-------------------+--------+  ECA        222      13       >50%                                   +----------+--------+--------+--------+-------------------+--------+ +----------+--------+--------+--------+------------+  Subclavian PSV cm/s EDV cm/s Describe Arm Pressure  +----------+--------+--------+--------+------------+             194                                      +----------+--------+--------+--------+------------+ +---------+--------+---+--------+--+---------+  Vertebral PSV cm/s 106 EDV cm/s 27 Antegrade  +---------+--------+---+--------+--+---------+  ABI Findings: +--------+------------------+-----+---------+--------+  Right    Rt Pressure (mmHg) Index  Waveform  Comment   +--------+------------------+-----+---------+--------+  Brachial 141                      triphasic           +--------+------------------+-----+---------+--------+  PTA      162                1.15                      +--------+------------------+-----+---------+--------+  DP       150                1.06                      +--------+------------------+-----+---------+--------+ +--------+------------------+-----+---------+-------+  Left     Lt Pressure (mmHg) Index Waveform  Comment   +--------+------------------+-----+---------+-------+  Brachial 135                      triphasic          +--------+------------------+-----+---------+-------+  PTA      158                1.12                     +--------+------------------+-----+---------+-------+  DP       157                1.11                     +--------+------------------+-----+---------+-------+  Right Doppler Findings: +-----------+--------+-----+---------+--------+  Site        Pressure Index Doppler   Comments  +-----------+--------+-----+---------+--------+  Brachial    141            triphasic           +-----------+--------+-----+---------+--------+  Radial                     triphasic           +-----------+--------+-----+---------+--------+  Ulnar                      triphasic           +-----------+--------+-----+---------+--------+  Palmar Arch                triphasic           +-----------+--------+-----+---------+--------+  Left Doppler Findings: +-----------+--------+-----+---------+--------+  Site        Pressure Index Doppler   Comments  +-----------+--------+-----+---------+--------+  Brachial    135            triphasic           +-----------+--------+-----+---------+--------+  Radial                     triphasic           +-----------+--------+-----+---------+--------+  Ulnar                      triphasic           +-----------+--------+-----+---------+--------+  Palmar Arch                triphasic           +-----------+--------+-----+---------+--------+  Summary: Right Carotid: Velocities in the right ICA are consistent with a 40-59%  stenosis. Plaqueing in bulb and proximal ica. Left Carotid: Velocities in the left ICA are consistent with a 40-59% stenosis.               Plaqueing in Bulb and Proximal Ica. Vertebrals: Bilateral vertebral arteries demonstrate antegrade flow. Right ABI: Resting right ankle-brachial index is within normal range. No evidence of significant right lower extremity  arterial disease. Left ABI: Resting left ankle-brachial index is within normal range. No evidence of significant left lower extremity arterial disease. Right Upper Extremity: Doppler waveforms remain within normal limits with right radial compression. Doppler waveforms remain within normal limits with right ulnar compression. Left Upper Extremity: Doppler waveforms remain within normal limits with left radial compression. Doppler waveforms remain within normal limits with left ulnar compression.  Electronically signed by Ruta Hinds MD on 01/28/2020 at 5:13:51 PM.    Final     Cardiac Studies   Cath: 01/28/20   Prox Cx to Mid Cx lesion is 75% stenosed.  2nd Mrg lesion is 70% stenosed.  1st Mrg lesion is 99% stenosed.  Ost LAD to Prox LAD lesion is 75% stenosed. Dist LAD lesion is 50% stenosed. Abnormal DFR indicating ischemia in LAD after the distal lesion.  Previously placed Mid LAD-1 stent (unknown type) is widely patent.  Previously placed Mid LAD-2 stent (unknown type) is widely patent.  Previously placed Mid LAD to Dist LAD stent (unknown type) is widely patent.  Ost RCA stent with restenosis of 50% stenosed.  Prox RCA to Mid RCA lesion is 40% stenosed.  The left ventricular systolic function is normal.  LV end diastolic pressure is normal.  The left ventricular ejection fraction is 55-65% by visual estimate.  There is no aortic valve stenosis.   Worsening of symptoms.  Ostial LAD is worse than prior cath.  He has significant disease in the very tortuous circumflex system with some progression of disease in proximal vessel compared to prior cath.  Difficult to engage ostial RCA stent but there is some restenosis there as well.  Will get cardiac surgery consult for CABG given his worsening sx and high risk anatomy of left main equivalent.  Will need time for Brilinta washout as well.  Results discussed with the wife.   Diagnostic Dominance: Right    Patient Profile      69 y.o. male with PMH of tobacco use, HTN, HL, CVA, CAD s/p PCI of RCA, LAD who presented office 8/13 with ongoing angina. Attempted to treat medically but failed and set up for outpatient cardiac cath. 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   Assessment & Plan    1. CAD/ unstable angina: Underwent cardiac cath yesterday noted above with progressive disease since his cath in 4/21. Given progression of LM disease, TCTS consulted for CABG. Seen by PA, with plans for surgery later this week after Brilinta wash out.  - P2Y12 ordered for 9/23 - continue asa, statin, BB, ACEi  2. HTN: continue BB, ACEi and Imdur. Borderline at times, titrate as needed.  3. HL: will increase Lipitor to 80mg  daily  For questions or updates, please contact [place] Please consult [email or web address] for contact info under        Signed, Reino Bellis, NP  01/29/2020, 7:14 AM    I have personally seen and examined this patient. I agree with the assessment and plan as outlined above.  Cath films reviewed by me. Pt with progression of CAD now with worsening of the ostial LAD lesion, several severe  lesions in the tortuous Circumflex system and moderate progression of disease in the RCA. Dr. Irish Lack has recommended CABG. He is being evaluated by the CT surgery team for CABG.  No chest pain this am. Labs reviewed by me. Telemetry with sinus rhythm Will continue ASA, statin and beta blocker while awaiting Brilinta washout. P2Y12 testing on 01/31/20.   Lauree Chandler 01/29/2020 9:56 AM

## 2020-01-29 NOTE — Progress Notes (Signed)
Pt sts that he typically has angina with ambulation. Has had a rest. Encouraged up in room but will not ambulate in hall. Discussed sternal precautions, IS (2000 ml), mobility post op and d/c planning. Pt very receptive. Gave materials to view.  [id number] Williamson, ACSM 2:07 PM 01/29/2020

## 2020-01-30 ENCOUNTER — Inpatient Hospital Stay (HOSPITAL_COMMUNITY): Payer: No Typology Code available for payment source

## 2020-01-30 LAB — CBC WITH DIFFERENTIAL/PLATELET
Abs Immature Granulocytes: 0.02 10*3/uL (ref 0.00–0.07)
Basophils Absolute: 0 10*3/uL (ref 0.0–0.1)
Basophils Relative: 1 %
Eosinophils Absolute: 0.2 10*3/uL (ref 0.0–0.5)
Eosinophils Relative: 4 %
HCT: 38.1 % — ABNORMAL LOW (ref 39.0–52.0)
Hemoglobin: 12.2 g/dL — ABNORMAL LOW (ref 13.0–17.0)
Immature Granulocytes: 0 %
Lymphocytes Relative: 25 %
Lymphs Abs: 1.7 10*3/uL (ref 0.7–4.0)
MCH: 28.6 pg (ref 26.0–34.0)
MCHC: 32 g/dL (ref 30.0–36.0)
MCV: 89.4 fL (ref 80.0–100.0)
Monocytes Absolute: 0.6 10*3/uL (ref 0.1–1.0)
Monocytes Relative: 9 %
Neutro Abs: 4 10*3/uL (ref 1.7–7.7)
Neutrophils Relative %: 61 %
Platelets: 167 10*3/uL (ref 150–400)
RBC: 4.26 MIL/uL (ref 4.22–5.81)
RDW: 12.8 % (ref 11.5–15.5)
WBC: 6.6 10*3/uL (ref 4.0–10.5)
nRBC: 0 % (ref 0.0–0.2)

## 2020-01-30 LAB — ABO/RH: ABO/RH(D): O POS

## 2020-01-30 LAB — COMPREHENSIVE METABOLIC PANEL
ALT: 14 U/L (ref 0–44)
AST: 16 U/L (ref 15–41)
Albumin: 3.7 g/dL (ref 3.5–5.0)
Alkaline Phosphatase: 76 U/L (ref 38–126)
Anion gap: 6 (ref 5–15)
BUN: 11 mg/dL (ref 8–23)
CO2: 27 mmol/L (ref 22–32)
Calcium: 9.1 mg/dL (ref 8.9–10.3)
Chloride: 107 mmol/L (ref 98–111)
Creatinine, Ser: 0.9 mg/dL (ref 0.61–1.24)
GFR calc Af Amer: >60 mL/min (ref >60)
GFR calc non Af Amer: >60 mL/min (ref >60)
Glucose, Bld: 93 mg/dL (ref 70–99)
Potassium: 4.2 mmol/L (ref 3.5–5.1)
Sodium: 140 mmol/L (ref 135–145)
Total Bilirubin: 0.7 mg/dL (ref 0.3–1.2)
Total Protein: 6.1 g/dL — ABNORMAL LOW (ref 6.5–8.1)

## 2020-01-30 LAB — BLOOD GAS, ARTERIAL
Acid-Base Excess: 0.1 mmol/L (ref 0.0–2.0)
Bicarbonate: 24.9 mmol/L (ref 20.0–28.0)
Drawn by: 53309
FIO2: 21
O2 Saturation: 96.3 %
Patient temperature: 37
pCO2 arterial: 44.8 mmHg (ref 32.0–48.0)
pH, Arterial: 7.363 (ref 7.350–7.450)
pO2, Arterial: 82 mmHg — ABNORMAL LOW (ref 83.0–108.0)

## 2020-01-30 LAB — URINALYSIS, ROUTINE W REFLEX MICROSCOPIC
Bilirubin Urine: NEGATIVE
Glucose, UA: NEGATIVE mg/dL
Hgb urine dipstick: NEGATIVE
Ketones, ur: NEGATIVE mg/dL
Leukocytes,Ua: NEGATIVE
Nitrite: NEGATIVE
Protein, ur: NEGATIVE mg/dL
Specific Gravity, Urine: 1.01 (ref 1.005–1.030)
pH: 6 (ref 5.0–8.0)

## 2020-01-30 LAB — APTT: aPTT: 36 seconds (ref 24–36)

## 2020-01-30 LAB — PROTIME-INR
INR: 1 (ref 0.8–1.2)
Prothrombin Time: 13 seconds (ref 11.4–15.2)

## 2020-01-30 LAB — HEMOGLOBIN A1C
Hgb A1c MFr Bld: 5.6 % (ref 4.8–5.6)
Mean Plasma Glucose: 114.02 mg/dL

## 2020-01-30 LAB — SURGICAL PCR SCREEN
MRSA, PCR: NEGATIVE
Staphylococcus aureus: NEGATIVE

## 2020-01-30 LAB — PLATELET INHIBITION P2Y12: Platelet Function  P2Y12: 223 [PRU] (ref 182–335)

## 2020-01-30 MED ORDER — BISACODYL 5 MG PO TBEC: 5.0000 mg | DELAYED_RELEASE_TABLET | Freq: Once | ORAL | Status: DC

## 2020-01-30 MED ORDER — MILRINONE LACTATE IN DEXTROSE 20-5 MG/100ML-% IV SOLN
0.3000 ug/kg/min | INTRAVENOUS | Status: DC
  Filled 2020-01-30: qty 100

## 2020-01-30 MED ORDER — CHLORHEXIDINE GLUCONATE 0.12 % MT SOLN
15.0000 mL | Freq: Once | OROMUCOSAL | Status: AC
  Administered 2020-01-31: 15 mL via OROMUCOSAL
  Filled 2020-01-30: qty 15

## 2020-01-30 MED ORDER — NOREPINEPHRINE 4 MG/250ML-% IV SOLN
0.0000 ug/min | INTRAVENOUS | Status: DC
  Filled 2020-01-30: qty 250

## 2020-01-30 MED ORDER — TRANEXAMIC ACID (OHS) PUMP PRIME SOLUTION
2.0000 mg/kg | INTRAVENOUS | Status: DC
  Filled 2020-01-30: qty 1.86

## 2020-01-30 MED ORDER — PLASMA-LYTE 148 IV SOLN
INTRAVENOUS | Status: DC
  Filled 2020-01-30: qty 2.5

## 2020-01-30 MED ORDER — SODIUM CHLORIDE 0.9 % IV SOLN
INTRAVENOUS | Status: DC
  Filled 2020-01-30: qty 30

## 2020-01-30 MED ORDER — PHENYLEPHRINE HCL-NACL 20-0.9 MG/250ML-% IV SOLN
30.0000 ug/min | INTRAVENOUS | Status: AC
  Administered 2020-01-31: 20 ug/min via INTRAVENOUS
  Administered 2020-01-31: 25 ug/min via INTRAVENOUS
  Filled 2020-01-30: qty 250

## 2020-01-30 MED ORDER — EPINEPHRINE HCL 5 MG/250ML IV SOLN IN NS
0.0000 ug/min | INTRAVENOUS | Status: AC
  Administered 2020-01-31: 2 ug/min via INTRAVENOUS
  Filled 2020-01-30: qty 250

## 2020-01-30 MED ORDER — VANCOMYCIN HCL 1500 MG/300ML IV SOLN
1500.0000 mg | INTRAVENOUS | Status: AC
  Administered 2020-01-31: 1500 mg via INTRAVENOUS
  Filled 2020-01-30: qty 300

## 2020-01-30 MED ORDER — MAGNESIUM SULFATE 50 % IJ SOLN
40.0000 meq | INTRAMUSCULAR | Status: DC
  Filled 2020-01-30: qty 9.85

## 2020-01-30 MED ORDER — POTASSIUM CHLORIDE 2 MEQ/ML IV SOLN
80.0000 meq | INTRAVENOUS | Status: DC
  Filled 2020-01-30: qty 40

## 2020-01-30 MED ORDER — NITROGLYCERIN 2 % TD OINT
0.5000 [in_us] | TOPICAL_OINTMENT | Freq: Three times a day (TID) | TRANSDERMAL | Status: DC
  Administered 2020-01-30 – 2020-01-31 (×4): 0.5 [in_us] via TOPICAL
  Filled 2020-01-30: qty 30

## 2020-01-30 MED ORDER — INSULIN REGULAR(HUMAN) IN NACL 100-0.9 UT/100ML-% IV SOLN
INTRAVENOUS | Status: AC
  Administered 2020-01-31: 1.8 [IU]/h via INTRAVENOUS
  Filled 2020-01-30: qty 100

## 2020-01-30 MED ORDER — CHLORHEXIDINE GLUCONATE CLOTH 2 % EX PADS
6.0000 | MEDICATED_PAD | Freq: Once | CUTANEOUS | Status: AC
  Administered 2020-01-31: 6 via TOPICAL

## 2020-01-30 MED ORDER — SODIUM CHLORIDE 0.9 % IV SOLN
1.5000 g | INTRAVENOUS | Status: AC
  Administered 2020-01-31: 1.5 g via INTRAVENOUS
  Filled 2020-01-30: qty 1.5

## 2020-01-30 MED ORDER — CHLORHEXIDINE GLUCONATE CLOTH 2 % EX PADS
6.0000 | MEDICATED_PAD | Freq: Once | CUTANEOUS | Status: AC
  Administered 2020-01-30: 6 via TOPICAL

## 2020-01-30 MED ORDER — TRANEXAMIC ACID (OHS) BOLUS VIA INFUSION
15.0000 mg/kg | INTRAVENOUS | Status: AC
  Administered 2020-01-31: 1392 mg via INTRAVENOUS
  Filled 2020-01-30: qty 1392

## 2020-01-30 MED ORDER — METOPROLOL TARTRATE 12.5 MG HALF TABLET
12.5000 mg | ORAL_TABLET | Freq: Once | ORAL | Status: AC
  Administered 2020-01-31: 12.5 mg via ORAL
  Filled 2020-01-30: qty 1

## 2020-01-30 MED ORDER — DEXMEDETOMIDINE HCL IN NACL 400 MCG/100ML IV SOLN
0.1000 ug/kg/h | INTRAVENOUS | Status: AC
  Administered 2020-01-31: .5 ug/kg/h via INTRAVENOUS
  Filled 2020-01-30: qty 100

## 2020-01-30 MED ORDER — NITROGLYCERIN 2 % TD OINT
1.0000 [in_us] | TOPICAL_OINTMENT | Freq: Four times a day (QID) | TRANSDERMAL | Status: DC
  Filled 2020-01-30: qty 30

## 2020-01-30 MED ORDER — NITROGLYCERIN IN D5W 200-5 MCG/ML-% IV SOLN
2.0000 ug/min | INTRAVENOUS | Status: AC
  Administered 2020-01-31: 10 ug/min via INTRAVENOUS
  Filled 2020-01-30: qty 250

## 2020-01-30 MED ORDER — TRANEXAMIC ACID 1000 MG/10ML IV SOLN
1.5000 mg/kg/h | INTRAVENOUS | Status: AC
  Administered 2020-01-31: 1.5 mg/kg/h via INTRAVENOUS
  Filled 2020-01-30: qty 25

## 2020-01-30 MED ORDER — SODIUM CHLORIDE 0.9 % IV SOLN
750.0000 mg | INTRAVENOUS | Status: AC
  Administered 2020-01-31: 750 mg via INTRAVENOUS
  Filled 2020-01-30: qty 750

## 2020-01-30 MED ORDER — TEMAZEPAM 15 MG PO CAPS
15.0000 mg | ORAL_CAPSULE | Freq: Once | ORAL | Status: AC | PRN
  Administered 2020-01-30: 15 mg via ORAL
  Filled 2020-01-30: qty 1

## 2020-01-30 NOTE — Progress Notes (Signed)
      [address]       [place] [address]             [phone]      Patient had some minor chest pain this morning.  This relieved with Tylenol.  He is very anxious about the recovery associated with surgery.  Plan is for CABG with Dr. Orvan Seen on Friday.  Ellwood Handler, PA-C

## 2020-01-30 NOTE — Anesthesia Preprocedure Evaluation (Addendum)
Anesthesia Evaluation  Patient identified by MRN, date of birth, ID band Patient awake    Reviewed: Allergy & Precautions, NPO status , Patient's Chart, lab work & pertinent test results, reviewed documented beta blocker date and time   Airway Mallampati: II  TM Distance: >3 FB Neck ROM: Full    Dental  (+) Dental Advisory Given, Missing   Pulmonary neg pulmonary ROS, Current Smoker,    Pulmonary exam normal breath sounds clear to auscultation       Cardiovascular hypertension, Pt. on home beta blockers and Pt. on medications + angina + CAD  Normal cardiovascular exam Rhythm:Regular Rate:Normal  Echo 01/2020 1. Left ventricular ejection fraction, by estimation, is 60 to 65%. The left ventricle has normal function. The left ventricle has no regional wall motion abnormalities. Left ventricular diastolic parameters were normal.  2. Right ventricular systolic function is normal. The right ventricular size is normal.  3. The mitral valve is normal in structure. No evidence of mitral valve regurgitation. No evidence of mitral stenosis.  4. The aortic valve is normal in structure. Aortic valve regurgitation is not visualized. No aortic stenosis is present.  5. The inferior vena cava is normal in size with greater than 50% respiratory variability, suggesting right atrial pressure of 3 mmHg   Neuro/Psych  Headaches, PSYCHIATRIC DISORDERS Anxiety Depression CVA    GI/Hepatic negative GI ROS, Neg liver ROS,   Endo/Other  negative endocrine ROS  Renal/GU negative Renal ROS     Musculoskeletal negative musculoskeletal ROS (+)   Abdominal   Peds  Hematology negative hematology ROS (+)   Anesthesia Other Findings   Reproductive/Obstetrics                            Anesthesia Physical Anesthesia Plan  ASA: IV  Anesthesia Plan: General   Post-op Pain Management:    Induction: Intravenous  PONV  Risk Score and Plan: 2 and Treatment may vary due to age or medical condition and Midazolam  Airway Management Planned: Oral ETT  Additional Equipment: Arterial line, CVP, PA Cath, TEE and Ultrasound Guidance Line Placement  Intra-op Plan:   Post-operative Plan: Extubation in OR  Informed Consent: I have reviewed the patients History and Physical, chart, labs and discussed the procedure including the risks, benefits and alternatives for the proposed anesthesia with the patient or authorized representative who has indicated his/her understanding and acceptance.     Dental advisory given  Plan Discussed with: CRNA  Anesthesia Plan Comments:        Anesthesia Quick Evaluation

## 2020-01-30 NOTE — Progress Notes (Addendum)
Progress Note  Patient Name: Daniel [name] Date of Encounter: 01/30/2020  [facility] Cardiologist: Larae Grooms, MD   Subjective   Still having episodes of chest tightness when he walks to the bathroom.   Inpatient Medications    Scheduled Meds: . aspirin  81 mg Oral Daily  . atorvastatin  80 mg Oral Daily  . enoxaparin (LOVENOX) injection  40 mg Subcutaneous Q24H  . FLUoxetine  40 mg Oral BID  . isosorbide mononitrate  30 mg Oral Daily  . lisinopril  40 mg Oral Daily  . metoprolol succinate  25 mg Oral Daily  . nicotine  21 mg Transdermal Daily  . sodium chloride flush  3 mL Intravenous Q12H  . sodium chloride flush  3 mL Intravenous Q12H   Continuous Infusions: . sodium chloride     PRN Meds: sodium chloride, acetaminophen, nitroGLYCERIN, ondansetron (ZOFRAN) IV, sodium chloride flush   Vital Signs    Vitals:   01/29/20 2015 01/30/20 0033 01/30/20 0446 01/30/20 0500  BP: (!) 138/95 112/67 136/69   Pulse: 71 75 66   Resp: 17 17 19    Temp: 98.5 F (36.9 C) 98.4 F (36.9 C) 98.4 F (36.9 C)   TempSrc: Oral Oral Oral   SpO2:    96%  Weight:   92.8 kg   Height:        Intake/Output Summary (Last 24 hours) at 01/30/2020 0825 Last data filed at 01/30/2020 0448 Gross per 24 hour  Intake 720 ml  Output 950 ml  Net -230 ml   Last 3 Weights 01/30/2020 01/29/2020 01/28/2020  Weight (lbs) 204 lb 9.6 oz 203 lb 14.4 oz 202 lb  Weight (kg) 92.806 kg 92.488 kg 91.627 kg      Telemetry    SR, PVCs - Personally Reviewed  ECG    No new tracing this morning  Physical Exam  Pleasant WM sitting up in bed GEN: No acute distress.   Neck: No JVD Cardiac: RRR, no murmurs, rubs, or gallops.  Respiratory: Clear to auscultation bilaterally. GI: Soft, nontender, non-distended  MS: No edema; No deformity. Neuro:  Nonfocal  Psych: Normal affect   Labs    High Sensitivity Troponin:  No results for input(s): TROPONINIHS in the last 720 hours.     Chemistry Recent Labs  Lab 01/25/20 1137 01/29/20 0253  NA 139 142  K 4.5 4.2  CL 103 110  CO2 21 23  GLUCOSE 93 86  BUN 14 10  CREATININE 0.82 0.83  CALCIUM 9.1 8.9  GFRNONAA 91 >60  GFRAA 105 >60  ANIONGAP  --  9     Hematology Recent Labs  Lab 01/25/20 1137 01/29/20 0253  WBC 6.0 4.8  RBC 4.21 4.17*  HGB 13.0 12.2*  HCT 37.4* 37.8*  MCV 89 90.6  MCH 30.9 29.3  MCHC 34.8 32.3  RDW 12.6 12.9  PLT 186 165    BNPNo results for input(s): BNP, PROBNP in the last 168 hours.   DDimer No results for input(s): DDIMER in the last 168 hours.   Radiology    ECHOCARDIOGRAM COMPLETE  Result Date: 01/28/2020    ECHOCARDIOGRAM REPORT   Patient Name:   Daniel [name] Date of Exam: 01/28/2020 Medical Rec #:  [id number]     Height:       71.0 in Accession #:    [id number]    Weight:       202.0 lb Date of Birth:  June 26, 1950     BSA:  2.117 m Patient Age:    69 years      BP:           150/63 mmHg Patient Gender: M             HR:           57 bpm. Exam Location:  Inpatient Procedure: 2D Echo, Cardiac Doppler and Color Doppler Indications:    CAD (coronary artery disease) [831517]                 Pre-op evaluation [[id number]]  History:        Patient has prior history of Echocardiogram examinations, most                 recent 07/29/2014. CAD, Stroke; Risk Factors:Hypertension,                 Dyslipidemia and Current Smoker.  Sonographer:    Vickie Epley RDCS Referring Phys: [id number] BROADUS Z ATKINS IMPRESSIONS  1. Left ventricular ejection fraction, by estimation, is 60 to 65%. The left ventricle has normal function. The left ventricle has no regional wall motion abnormalities. Left ventricular diastolic parameters were normal.  2. Right ventricular systolic function is normal. The right ventricular size is normal.  3. The mitral valve is normal in structure. No evidence of mitral valve regurgitation. No evidence of mitral stenosis.  4. The aortic valve is normal in structure. Aortic  valve regurgitation is not visualized. No aortic stenosis is present.  5. The inferior vena cava is normal in size with greater than 50% respiratory variability, suggesting right atrial pressure of 3 mmHg. FINDINGS  Left Ventricle: Left ventricular ejection fraction, by estimation, is 60 to 65%. The left ventricle has normal function. The left ventricle has no regional wall motion abnormalities. The left ventricular internal cavity size was normal in size. There is  no left ventricular hypertrophy. Left ventricular diastolic parameters were normal. Normal left ventricular filling pressure. Right Ventricle: The right ventricular size is normal. No increase in right ventricular wall thickness. Right ventricular systolic function is normal. Left Atrium: Left atrial size was normal in size. Right Atrium: Right atrial size was normal in size. Pericardium: There is no evidence of pericardial effusion. Mitral Valve: The mitral valve is normal in structure. No evidence of mitral valve regurgitation. No evidence of mitral valve stenosis. Tricuspid Valve: The tricuspid valve is normal in structure. Tricuspid valve regurgitation is not demonstrated. No evidence of tricuspid stenosis. Aortic Valve: The aortic valve is normal in structure. Aortic valve regurgitation is not visualized. No aortic stenosis is present. Pulmonic Valve: The pulmonic valve was normal in structure. Pulmonic valve regurgitation is not visualized. No evidence of pulmonic stenosis. Aorta: The aortic root is normal in size and structure. Venous: The inferior vena cava is normal in size with greater than 50% respiratory variability, suggesting right atrial pressure of 3 mmHg. IAS/Shunts: The interatrial septum appears to be lipomatous. No atrial level shunt detected by color flow Doppler.  LEFT VENTRICLE PLAX 2D LVIDd:         4.80 cm      Diastology LVIDs:         3.30 cm      LV e' medial:    8.05 cm/s LV PW:         0.90 cm      LV E/e' medial:  11.9 LV  IVS:        0.90 cm      LV  e' lateral:   9.57 cm/s LVOT diam:     2.40 cm      LV E/e' lateral: 10.0 LV SV:         69 LV SV Index:   33 LVOT Area:     4.52 cm  LV Volumes (MOD) LV vol d, MOD A2C: 125.0 ml LV vol d, MOD A4C: 150.0 ml LV vol s, MOD A2C: 45.4 ml LV vol s, MOD A4C: 63.5 ml LV SV MOD A2C:     79.6 ml LV SV MOD A4C:     150.0 ml LV SV MOD BP:      80.2 ml RIGHT VENTRICLE RV S prime:     15.10 cm/s TAPSE (M-mode): 2.2 cm LEFT ATRIUM             Index       RIGHT ATRIUM           Index LA diam:        3.50 cm 1.65 cm/m  RA Area:     11.80 cm LA Vol (A2C):   22.3 ml 10.53 ml/m RA Volume:   25.00 ml  11.81 ml/m LA Vol (A4C):   38.3 ml 18.09 ml/m LA Biplane Vol: 30.3 ml 14.31 ml/m  AORTIC VALVE LVOT Vmax:   77.00 cm/s LVOT Vmean:  54.600 cm/s LVOT VTI:    0.153 m  AORTA Ao Root diam: 3.40 cm MITRAL VALVE MV Area (PHT): 3.77 cm    SHUNTS MV Decel Time: 201 msec    Systemic VTI:  0.15 m MV E velocity: 96.00 cm/s  Systemic Diam: 2.40 cm MV A velocity: 92.10 cm/s MV E/A ratio:  1.04 Fransico Him MD Electronically signed by Fransico Him MD Signature Date/Time: 01/28/2020/3:47:43 PM    Final    VAS US DOPPLER PRE CABG  Result Date: 01/28/2020 PREOPERATIVE VASCULAR EVALUATION  Indications:  Pre-CABG. Risk Factors: Hypertension, hyperlipidemia. Performing Technologist: Abram Sander RVS Supporting Technologist: Griffin Basil RCT RDMS  Examination Guidelines: A complete evaluation includes B-mode imaging, spectral Doppler, color Doppler, and power Doppler as needed of all accessible portions of each vessel. Bilateral testing is considered an integral part of a complete examination. Limited examinations for reoccurring indications may be performed as noted.  Right Carotid Findings: +----------+--------+--------+--------+---------------------+------------------+           PSV cm/sEDV cm/sStenosisDescribe             Comments            +----------+--------+--------+--------+---------------------+------------------+ CCA Prox  101     16                                                      +----------+--------+--------+--------+---------------------+------------------+ CCA Distal86      21                                   intimal thickening +----------+--------+--------+--------+---------------------+------------------+ ICA Prox  175     37      40-59%  calcific and  irregular                               +----------+--------+--------+--------+---------------------+------------------+ ICA Distal85      21                                                      +----------+--------+--------+--------+---------------------+------------------+ ECA       199     12              smooth and                                                                homogeneous                             +----------+--------+--------+--------+---------------------+------------------+ Portions of this table do not appear on this page. +----------+--------+-------+--------+------------+           PSV cm/sEDV cmsDescribeArm Pressure +----------+--------+-------+--------+------------+ Subclavian160     2                           +----------+--------+-------+--------+------------+ +---------+--------+--+--------+--+---------+ VertebralPSV cm/s56EDV cm/s21Antegrade +---------+--------+--+--------+--+---------+ Left Carotid Findings: +----------+--------+--------+--------+-------------------+--------+           PSV cm/sEDV cm/sStenosisDescribe           Comments +----------+--------+--------+--------+-------------------+--------+ CCA Prox  87      17                                          +----------+--------+--------+--------+-------------------+--------+ CCA Distal85      15                                           +----------+--------+--------+--------+-------------------+--------+ ICA Prox  178     51      40-59%  calcific and smooth         +----------+--------+--------+--------+-------------------+--------+ ICA Distal99      21                                          +----------+--------+--------+--------+-------------------+--------+ ECA       222     13      >50%                                +----------+--------+--------+--------+-------------------+--------+ +----------+--------+--------+--------+------------+ SubclavianPSV cm/sEDV cm/sDescribeArm Pressure +----------+--------+--------+--------+------------+           194                                  +----------+--------+--------+--------+------------+ +---------+--------+---+--------+--+---------+ VertebralPSV cm/s106EDV cm/s27Antegrade +---------+--------+---+--------+--+---------+  ABI Findings: +--------+------------------+-----+---------+--------+  Right   Rt Pressure (mmHg)IndexWaveform Comment  +--------+------------------+-----+---------+--------+ [id number]                    triphasic         +--------+------------------+-----+---------+--------+ PTA     162               1.15                   +--------+------------------+-----+---------+--------+ DP      150               1.06                   +--------+------------------+-----+---------+--------+ +--------+------------------+-----+---------+-------+ Left    Lt Pressure (mmHg)IndexWaveform Comment +--------+------------------+-----+---------+-------+ [id number]                    triphasic        +--------+------------------+-----+---------+-------+ PTA     158               1.12                  +--------+------------------+-----+---------+-------+ DP      157               1.11                  +--------+------------------+-----+---------+-------+  Right Doppler Findings:  +-----------+--------+-----+---------+--------+ Site       PressureIndexDoppler  Comments +-----------+--------+-----+---------+--------+ Brachial   141          triphasic         +-----------+--------+-----+---------+--------+ Radial                  triphasic         +-----------+--------+-----+---------+--------+ Ulnar                   triphasic         +-----------+--------+-----+---------+--------+ Palmar Arch             triphasic         +-----------+--------+-----+---------+--------+  Left Doppler Findings: +-----------+--------+-----+---------+--------+ Site       PressureIndexDoppler  Comments +-----------+--------+-----+---------+--------+ Brachial   135          triphasic         +-----------+--------+-----+---------+--------+ Radial                  triphasic         +-----------+--------+-----+---------+--------+ Ulnar                   triphasic         +-----------+--------+-----+---------+--------+ Palmar Arch             triphasic         +-----------+--------+-----+---------+--------+  Summary: Right Carotid: Velocities in the right ICA are consistent with a 40-59%                stenosis. Plaqueing in bulb and proximal ica. Left Carotid: Velocities in the left ICA are consistent with a 40-59% stenosis.               Plaqueing in Bulb and Proximal Ica. Vertebrals: Bilateral vertebral arteries demonstrate antegrade flow. Right ABI: Resting right ankle-brachial index is within normal range. No evidence of significant right lower extremity arterial disease. Left ABI: Resting left ankle-brachial index is within normal range. No evidence of significant left lower extremity arterial disease. Right Upper Extremity: Doppler waveforms remain within  normal limits with right radial compression. Doppler waveforms remain within normal limits with right ulnar compression. Left Upper Extremity: Doppler waveforms remain within normal limits with left  radial compression. Doppler waveforms remain within normal limits with left ulnar compression.  Electronically signed by Ruta Hinds MD on 01/28/2020 at 5:13:51 PM.    Final     Cardiac Studies   Cath: 01/28/20   Prox Cx to Mid Cx lesion is 75% stenosed.  2nd Mrg lesion is 70% stenosed.  1st Mrg lesion is 99% stenosed.  Ost LAD to Prox LAD lesion is 75% stenosed. Dist LAD lesion is 50% stenosed. Abnormal DFR indicating ischemia in LAD after the distal lesion.  Previously placed Mid LAD-1 stent (unknown type) is widely patent.  Previously placed Mid LAD-2 stent (unknown type) is widely patent.  Previously placed Mid LAD to Dist LAD stent (unknown type) is widely patent.  Ost RCA stent with restenosis of 50% stenosed.  Prox RCA to Mid RCA lesion is 40% stenosed.  The left ventricular systolic function is normal.  LV end diastolic pressure is normal.  The left ventricular ejection fraction is 55-65% by visual estimate.  There is no aortic valve stenosis.  Worsening of symptoms. Ostial LAD is worse than prior cath. He has significant disease in the very tortuous circumflex system with some progression of disease in proximal vessel compared to prior cath. Difficult to engage ostial RCA stent but there is some restenosis there as well. Will get cardiac surgery consult for CABG given his worsening sx and high risk anatomy of left main equivalent.  Will need time for Brilinta washout as well. Results discussed with the wife.   Diagnostic Dominance: Right   Echo: 01/28/20  IMPRESSIONS    1. Left ventricular ejection fraction, by estimation, is 60 to 65%. The  left ventricle has normal function. The left ventricle has no regional  wall motion abnormalities. Left ventricular diastolic parameters were  normal.  2. Right ventricular systolic function is normal. The right ventricular  size is normal.  3. The mitral valve is normal in structure. No evidence of mitral  valve  regurgitation. No evidence of mitral stenosis.  4. The aortic valve is normal in structure. Aortic valve regurgitation is  not visualized. No aortic stenosis is present.  5. The inferior vena cava is normal in size with greater than 50%  respiratory variability, suggesting right atrial pressure of 3 mmHg.   Patient Profile     69 y.o. male  with PMH of tobacco use, HTN, HL, CVA, CAD s/p PCI of RCA, LAD who presented office 8/13 with ongoing angina. Attempted to treat medically but failed and set up for outpatient cardiac cath. 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   Assessment & Plan    1. CAD/ unstable angina: Underwent cardiac cath yesterday noted above with progressive disease since his cath in 4/21. Given progression of LM disease, ostial LAD and RCA and tortuous Lcx, TCTS consulted for CABG. Seen by PA, with plans for surgery later this week after Brilinta wash out. Echo with normal EF and no rWMA. - still having chest tightness with walking to the bathroom, will add nitropaste - P2Y12 ordered for 9/23 - continue asa, statin, BB, ACEi - CR following  2. HTN: continue BB, ACEi and Imdur.  3. HL: increased Lipitor to 80mg  daily this admission  4. Headache: feels stressed about upcoming surgery and thinks it is contributing. - PRN tylenol  5. Depression: was on Prozac  as outpatient and ran out prior to admission. This has been restarted  For questions or updates, please contact [place] Please consult [email or web address] for contact info under     Signed, Reino Bellis, NP  01/30/2020, 8:25 AM    Patient seen, examined. Available data reviewed. Agree with findings, assessment, and plan as outlined by Reino Bellis, NP. Patient is alert, oriented, in no distress. Lungs are clear, JVP is normal, heart is regular rate and rhythm with no murmur gallop, abdomen soft nontender, right radial site is clear, extremities have no edema. The patient is feeling better  after placement of nitroglycerin paste. He did have some chest discomfort with moving around this morning. He is chest pain-free at present. He is awaiting CABG after Brilinta washout. P2 Y 12 testing is pending. Plan is for surgery on Friday unless his P2 Y 12 is in acceptable range to have surgery tomorrow.  [name], M.D. 01/30/2020 1:00 PM

## 2020-01-31 ENCOUNTER — Inpatient Hospital Stay (HOSPITAL_COMMUNITY): Payer: No Typology Code available for payment source | Admitting: Certified Registered Nurse Anesthetist

## 2020-01-31 ENCOUNTER — Inpatient Hospital Stay (HOSPITAL_COMMUNITY)
Admission: RE | Disposition: A | Discharge status: Home / Self Care | Payer: Self-pay | Source: Home / Self Care | Attending: Cardiothoracic Surgery

## 2020-01-31 ENCOUNTER — Encounter (HOSPITAL_COMMUNITY): Payer: Self-pay | Admitting: Interventional Cardiology

## 2020-01-31 ENCOUNTER — Inpatient Hospital Stay (HOSPITAL_COMMUNITY): Payer: No Typology Code available for payment source

## 2020-01-31 ENCOUNTER — Ambulatory Visit: Payer: Medicare Other | Admitting: Interventional Cardiology

## 2020-01-31 DIAGNOSIS — I200 Unstable angina: Secondary | ICD-10-CM

## 2020-01-31 HISTORY — PX: CORONARY ARTERY BYPASS GRAFT: SHX141

## 2020-01-31 HISTORY — PX: RADIAL ARTERY HARVEST: SHX5067

## 2020-01-31 HISTORY — PX: TEE WITHOUT CARDIOVERSION: SHX5443

## 2020-01-31 LAB — POCT I-STAT 7, (LYTES, BLD GAS, ICA,H+H)
Acid-Base Excess: 1 mmol/L (ref 0.0–2.0)
Acid-Base Excess: 3 mmol/L — ABNORMAL HIGH (ref 0.0–2.0)
Acid-Base Excess: 3 mmol/L — ABNORMAL HIGH (ref 0.0–2.0)
Acid-Base Excess: 1 mmol/L (ref 0.0–2.0)
Acid-base deficit: 2 mmol/L (ref 0.0–2.0)
Acid-base deficit: 3 mmol/L — ABNORMAL HIGH (ref 0.0–2.0)
Acid-base deficit: 6 mmol/L — ABNORMAL HIGH (ref 0.0–2.0)
Acid-base deficit: 4 mmol/L — ABNORMAL HIGH (ref 0.0–2.0)
Acid-base deficit: 4 mmol/L — ABNORMAL HIGH (ref 0.0–2.0)
Bicarbonate: 28.6 mmol/L — ABNORMAL HIGH (ref 20.0–28.0)
Bicarbonate: 27.1 mmol/L (ref 20.0–28.0)
Bicarbonate: 28 mmol/L (ref 20.0–28.0)
Bicarbonate: 23.7 mmol/L (ref 20.0–28.0)
Bicarbonate: 27.1 mmol/L (ref 20.0–28.0)
Bicarbonate: 24.1 mmol/L (ref 20.0–28.0)
Bicarbonate: 21.3 mmol/L (ref 20.0–28.0)
Bicarbonate: 22.1 mmol/L (ref 20.0–28.0)
Bicarbonate: 22.3 mmol/L (ref 20.0–28.0)
Calcium, Ion: 1.28 mmol/L (ref 1.15–1.40)
Calcium, Ion: 0.98 mmol/L — ABNORMAL LOW (ref 1.15–1.40)
Calcium, Ion: 1.08 mmol/L — ABNORMAL LOW (ref 1.15–1.40)
Calcium, Ion: 1.13 mmol/L — ABNORMAL LOW (ref 1.15–1.40)
Calcium, Ion: 1.5 mmol/L — ABNORMAL HIGH (ref 1.15–1.40)
Calcium, Ion: 1.28 mmol/L (ref 1.15–1.40)
Calcium, Ion: 1.19 mmol/L (ref 1.15–1.40)
Calcium, Ion: 1.22 mmol/L (ref 1.15–1.40)
Calcium, Ion: 1.24 mmol/L (ref 1.15–1.40)
HCT: 36 % — ABNORMAL LOW (ref 39.0–52.0)
HCT: 27 % — ABNORMAL LOW (ref 39.0–52.0)
HCT: 26 % — ABNORMAL LOW (ref 39.0–52.0)
HCT: 25 % — ABNORMAL LOW (ref 39.0–52.0)
HCT: 27 % — ABNORMAL LOW (ref 39.0–52.0)
HCT: 27 % — ABNORMAL LOW (ref 39.0–52.0)
HCT: 25 % — ABNORMAL LOW (ref 39.0–52.0)
HCT: 25 % — ABNORMAL LOW (ref 39.0–52.0)
HCT: 23 % — ABNORMAL LOW (ref 39.0–52.0)
Hemoglobin: 12.2 g/dL — ABNORMAL LOW (ref 13.0–17.0)
Hemoglobin: 9.2 g/dL — ABNORMAL LOW (ref 13.0–17.0)
Hemoglobin: 8.8 g/dL — ABNORMAL LOW (ref 13.0–17.0)
Hemoglobin: 8.5 g/dL — ABNORMAL LOW (ref 13.0–17.0)
Hemoglobin: 9.2 g/dL — ABNORMAL LOW (ref 13.0–17.0)
Hemoglobin: 9.2 g/dL — ABNORMAL LOW (ref 13.0–17.0)
Hemoglobin: 8.5 g/dL — ABNORMAL LOW (ref 13.0–17.0)
Hemoglobin: 8.5 g/dL — ABNORMAL LOW (ref 13.0–17.0)
Hemoglobin: 7.8 g/dL — ABNORMAL LOW (ref 13.0–17.0)
O2 Saturation: 100 %
O2 Saturation: 100 %
O2 Saturation: 100 %
O2 Saturation: 100 %
O2 Saturation: 97 %
O2 Saturation: 98 %
O2 Saturation: 96 %
O2 Saturation: 97 %
O2 Saturation: 95 %
Patient temperature: 36.2
Patient temperature: 38
Patient temperature: 38.2
Patient temperature: 38
Potassium: 4.1 mmol/L (ref 3.5–5.1)
Potassium: 4.2 mmol/L (ref 3.5–5.1)
Potassium: 4.7 mmol/L (ref 3.5–5.1)
Potassium: 4.1 mmol/L (ref 3.5–5.1)
Potassium: 5.2 mmol/L — ABNORMAL HIGH (ref 3.5–5.1)
Potassium: 4.1 mmol/L (ref 3.5–5.1)
Potassium: 4.4 mmol/L (ref 3.5–5.1)
Potassium: 4.5 mmol/L (ref 3.5–5.1)
Potassium: 4.4 mmol/L (ref 3.5–5.1)
Sodium: 143 mmol/L (ref 135–145)
Sodium: 142 mmol/L (ref 135–145)
Sodium: 141 mmol/L (ref 135–145)
Sodium: 142 mmol/L (ref 135–145)
Sodium: 142 mmol/L (ref 135–145)
Sodium: 144 mmol/L (ref 135–145)
Sodium: 145 mmol/L (ref 135–145)
Sodium: 143 mmol/L (ref 135–145)
Sodium: 143 mmol/L (ref 135–145)
TCO2: 30 mmol/L (ref 22–32)
TCO2: 28 mmol/L (ref 22–32)
TCO2: 29 mmol/L (ref 22–32)
TCO2: 25 mmol/L (ref 22–32)
TCO2: 29 mmol/L (ref 22–32)
TCO2: 26 mmol/L (ref 22–32)
TCO2: 23 mmol/L (ref 22–32)
TCO2: 23 mmol/L (ref 22–32)
TCO2: 24 mmol/L (ref 22–32)
pCO2 arterial: 57.4 mmHg — ABNORMAL HIGH (ref 32.0–48.0)
pCO2 arterial: 39.7 mmHg (ref 32.0–48.0)
pCO2 arterial: 43.8 mmHg (ref 32.0–48.0)
pCO2 arterial: 46.1 mmHg (ref 32.0–48.0)
pCO2 arterial: 50 mmHg — ABNORMAL HIGH (ref 32.0–48.0)
pCO2 arterial: 47.8 mmHg (ref 32.0–48.0)
pCO2 arterial: 50.5 mmHg — ABNORMAL HIGH (ref 32.0–48.0)
pCO2 arterial: 46.3 mmHg (ref 32.0–48.0)
pCO2 arterial: 49.5 mmHg — ABNORMAL HIGH (ref 32.0–48.0)
pH, Arterial: 7.305 — ABNORMAL LOW (ref 7.350–7.450)
pH, Arterial: 7.442 (ref 7.350–7.450)
pH, Arterial: 7.413 (ref 7.350–7.450)
pH, Arterial: 7.319 — ABNORMAL LOW (ref 7.350–7.450)
pH, Arterial: 7.342 — ABNORMAL LOW (ref 7.350–7.450)
pH, Arterial: 7.306 — ABNORMAL LOW (ref 7.350–7.450)
pH, Arterial: 7.238 — ABNORMAL LOW (ref 7.350–7.450)
pH, Arterial: 7.293 — ABNORMAL LOW (ref 7.350–7.450)
pH, Arterial: 7.267 — ABNORMAL LOW (ref 7.350–7.450)
pO2, Arterial: 385 mmHg — ABNORMAL HIGH (ref 83.0–108.0)
pO2, Arterial: 348 mmHg — ABNORMAL HIGH (ref 83.0–108.0)
pO2, Arterial: 350 mmHg — ABNORMAL HIGH (ref 83.0–108.0)
pO2, Arterial: 340 mmHg — ABNORMAL HIGH (ref 83.0–108.0)
pO2, Arterial: 94 mmHg (ref 83.0–108.0)
pO2, Arterial: 104 mmHg (ref 83.0–108.0)
pO2, Arterial: 99 mmHg (ref 83.0–108.0)
pO2, Arterial: 104 mmHg (ref 83.0–108.0)
pO2, Arterial: 93 mmHg (ref 83.0–108.0)

## 2020-01-31 LAB — GLUCOSE, CAPILLARY
Glucose-Capillary: 104 mg/dL — ABNORMAL HIGH (ref 70–99)
Glucose-Capillary: 133 mg/dL — ABNORMAL HIGH (ref 70–99)
Glucose-Capillary: 124 mg/dL — ABNORMAL HIGH (ref 70–99)
Glucose-Capillary: 127 mg/dL — ABNORMAL HIGH (ref 70–99)
Glucose-Capillary: 135 mg/dL — ABNORMAL HIGH (ref 70–99)
Glucose-Capillary: 133 mg/dL — ABNORMAL HIGH (ref 70–99)
Glucose-Capillary: 159 mg/dL — ABNORMAL HIGH (ref 70–99)
Glucose-Capillary: 158 mg/dL — ABNORMAL HIGH (ref 70–99)

## 2020-01-31 LAB — POCT I-STAT, CHEM 8
BUN: 12 mg/dL (ref 8–23)
BUN: 11 mg/dL (ref 8–23)
BUN: 10 mg/dL (ref 8–23)
BUN: 12 mg/dL (ref 8–23)
BUN: 12 mg/dL (ref 8–23)
BUN: 13 mg/dL (ref 8–23)
Calcium, Ion: 1.26 mmol/L (ref 1.15–1.40)
Calcium, Ion: 1.22 mmol/L (ref 1.15–1.40)
Calcium, Ion: 0.98 mmol/L — ABNORMAL LOW (ref 1.15–1.40)
Calcium, Ion: 1.11 mmol/L — ABNORMAL LOW (ref 1.15–1.40)
Calcium, Ion: 1.12 mmol/L — ABNORMAL LOW (ref 1.15–1.40)
Calcium, Ion: 1.52 mmol/L (ref 1.15–1.40)
Chloride: 104 mmol/L (ref 98–111)
Chloride: 104 mmol/L (ref 98–111)
Chloride: 100 mmol/L (ref 98–111)
Chloride: 104 mmol/L (ref 98–111)
Chloride: 105 mmol/L (ref 98–111)
Chloride: 107 mmol/L (ref 98–111)
Creatinine, Ser: 0.7 mg/dL (ref 0.61–1.24)
Creatinine, Ser: 0.6 mg/dL — ABNORMAL LOW (ref 0.61–1.24)
Creatinine, Ser: 0.5 mg/dL — ABNORMAL LOW (ref 0.61–1.24)
Creatinine, Ser: 0.6 mg/dL — ABNORMAL LOW (ref 0.61–1.24)
Creatinine, Ser: 0.7 mg/dL (ref 0.61–1.24)
Creatinine, Ser: 0.7 mg/dL (ref 0.61–1.24)
Glucose, Bld: 97 mg/dL (ref 70–99)
Glucose, Bld: 97 mg/dL (ref 70–99)
Glucose, Bld: 88 mg/dL (ref 70–99)
Glucose, Bld: 138 mg/dL — ABNORMAL HIGH (ref 70–99)
Glucose, Bld: 134 mg/dL — ABNORMAL HIGH (ref 70–99)
Glucose, Bld: 155 mg/dL — ABNORMAL HIGH (ref 70–99)
HCT: 33 % — ABNORMAL LOW (ref 39.0–52.0)
HCT: 30 % — ABNORMAL LOW (ref 39.0–52.0)
HCT: 28 % — ABNORMAL LOW (ref 39.0–52.0)
HCT: 26 % — ABNORMAL LOW (ref 39.0–52.0)
HCT: 23 % — ABNORMAL LOW (ref 39.0–52.0)
HCT: 27 % — ABNORMAL LOW (ref 39.0–52.0)
Hemoglobin: 11.2 g/dL — ABNORMAL LOW (ref 13.0–17.0)
Hemoglobin: 10.2 g/dL — ABNORMAL LOW (ref 13.0–17.0)
Hemoglobin: 9.5 g/dL — ABNORMAL LOW (ref 13.0–17.0)
Hemoglobin: 8.8 g/dL — ABNORMAL LOW (ref 13.0–17.0)
Hemoglobin: 7.8 g/dL — ABNORMAL LOW (ref 13.0–17.0)
Hemoglobin: 9.2 g/dL — ABNORMAL LOW (ref 13.0–17.0)
Potassium: 4.1 mmol/L (ref 3.5–5.1)
Potassium: 3.8 mmol/L (ref 3.5–5.1)
Potassium: 4.1 mmol/L (ref 3.5–5.1)
Potassium: 4.7 mmol/L (ref 3.5–5.1)
Potassium: 4.1 mmol/L (ref 3.5–5.1)
Potassium: 5.3 mmol/L — ABNORMAL HIGH (ref 3.5–5.1)
Sodium: 143 mmol/L (ref 135–145)
Sodium: 140 mmol/L (ref 135–145)
Sodium: 144 mmol/L (ref 135–145)
Sodium: 140 mmol/L (ref 135–145)
Sodium: 141 mmol/L (ref 135–145)
Sodium: 141 mmol/L (ref 135–145)
TCO2: 28 mmol/L (ref 22–32)
TCO2: 23 mmol/L (ref 22–32)
TCO2: 25 mmol/L (ref 22–32)
TCO2: 25 mmol/L (ref 22–32)
TCO2: 23 mmol/L (ref 22–32)
TCO2: 25 mmol/L (ref 22–32)

## 2020-01-31 LAB — CBC
HCT: 39.1 % (ref 39.0–52.0)
HCT: 29.6 % — ABNORMAL LOW (ref 39.0–52.0)
HCT: 27.7 % — ABNORMAL LOW (ref 39.0–52.0)
Hemoglobin: 12.8 g/dL — ABNORMAL LOW (ref 13.0–17.0)
Hemoglobin: 9.7 g/dL — ABNORMAL LOW (ref 13.0–17.0)
Hemoglobin: 9.2 g/dL — ABNORMAL LOW (ref 13.0–17.0)
MCH: 29 pg (ref 26.0–34.0)
MCH: 29.8 pg (ref 26.0–34.0)
MCH: 30.3 pg (ref 26.0–34.0)
MCHC: 32.7 g/dL (ref 30.0–36.0)
MCHC: 32.8 g/dL (ref 30.0–36.0)
MCHC: 33.2 g/dL (ref 30.0–36.0)
MCV: 88.7 fL (ref 80.0–100.0)
MCV: 90.8 fL (ref 80.0–100.0)
MCV: 91.1 fL (ref 80.0–100.0)
Platelets: 166 10*3/uL (ref 150–400)
Platelets: 128 10*3/uL — ABNORMAL LOW (ref 150–400)
Platelets: 171 10*3/uL (ref 150–400)
RBC: 4.41 MIL/uL (ref 4.22–5.81)
RBC: 3.26 MIL/uL — ABNORMAL LOW (ref 4.22–5.81)
RBC: 3.04 MIL/uL — ABNORMAL LOW (ref 4.22–5.81)
RDW: 12.6 % (ref 11.5–15.5)
RDW: 12.5 % (ref 11.5–15.5)
RDW: 12.7 % (ref 11.5–15.5)
WBC: 5.5 10*3/uL (ref 4.0–10.5)
WBC: 10.7 10*3/uL — ABNORMAL HIGH (ref 4.0–10.5)
WBC: 13 10*3/uL — ABNORMAL HIGH (ref 4.0–10.5)
nRBC: 0 % (ref 0.0–0.2)
nRBC: 0 % (ref 0.0–0.2)
nRBC: 0 % (ref 0.0–0.2)

## 2020-01-31 LAB — APTT: aPTT: 43 seconds — ABNORMAL HIGH (ref 24–36)

## 2020-01-31 LAB — ECHO INTRAOPERATIVE TEE
AV Mean grad: 4 mmHg
AV Peak grad: 8.6 mmHg
Ao pk vel: 1.47 m/s
Height: 71 in
Weight: 3273.6 oz

## 2020-01-31 LAB — BASIC METABOLIC PANEL
Anion gap: 10 (ref 5–15)
Anion gap: 9 (ref 5–15)
BUN: 13 mg/dL (ref 8–23)
BUN: 13 mg/dL (ref 8–23)
CO2: 25 mmol/L (ref 22–32)
CO2: 22 mmol/L (ref 22–32)
Calcium: 9.1 mg/dL (ref 8.9–10.3)
Calcium: 8.3 mg/dL — ABNORMAL LOW (ref 8.9–10.3)
Chloride: 106 mmol/L (ref 98–111)
Chloride: 110 mmol/L (ref 98–111)
Creatinine, Ser: 0.86 mg/dL (ref 0.61–1.24)
Creatinine, Ser: 1.01 mg/dL (ref 0.61–1.24)
GFR calc Af Amer: >60 mL/min (ref >60)
GFR calc Af Amer: >60 mL/min (ref >60)
GFR calc non Af Amer: >60 mL/min (ref >60)
GFR calc non Af Amer: >60 mL/min (ref >60)
Glucose, Bld: 108 mg/dL — ABNORMAL HIGH (ref 70–99)
Glucose, Bld: 141 mg/dL — ABNORMAL HIGH (ref 70–99)
Potassium: 4 mmol/L (ref 3.5–5.1)
Potassium: 4.7 mmol/L (ref 3.5–5.1)
Sodium: 141 mmol/L (ref 135–145)
Sodium: 141 mmol/L (ref 135–145)

## 2020-01-31 LAB — HEMOGLOBIN AND HEMATOCRIT, BLOOD
HCT: 25.9 % — ABNORMAL LOW (ref 39.0–52.0)
Hemoglobin: 8.5 g/dL — ABNORMAL LOW (ref 13.0–17.0)

## 2020-01-31 LAB — PLATELET COUNT: Platelets: 141 10*3/uL — ABNORMAL LOW (ref 150–400)

## 2020-01-31 LAB — PROTIME-INR
INR: 1.4 — ABNORMAL HIGH (ref 0.8–1.2)
Prothrombin Time: 16.1 seconds — ABNORMAL HIGH (ref 11.4–15.2)

## 2020-01-31 LAB — MAGNESIUM: Magnesium: 3 mg/dL — ABNORMAL HIGH (ref 1.7–2.4)

## 2020-01-31 SURGERY — CORONARY ARTERY BYPASS GRAFTING (CABG)
Anesthesia: General | Site: Chest

## 2020-01-31 MED ORDER — FENTANYL CITRATE (PF) 250 MCG/5ML IJ SOLN
INTRAMUSCULAR | Status: DC | PRN
Start: 2020-01-31 — End: 2020-01-31
  Administered 2020-01-31: 100 ug via INTRAVENOUS
  Administered 2020-01-31: 250 ug via INTRAVENOUS
  Administered 2020-01-31 (×5): 100 ug via INTRAVENOUS
  Administered 2020-01-31 (×2): 150 ug via INTRAVENOUS
  Administered 2020-01-31 (×2): 100 ug via INTRAVENOUS
  Administered 2020-01-31: 250 ug via INTRAVENOUS

## 2020-01-31 MED ORDER — ALBUMIN HUMAN 5 % IV SOLN
250.0000 mL | INTRAVENOUS | Status: DC | PRN
  Administered 2020-01-31: 12.5 g via INTRAVENOUS
  Filled 2020-01-31: qty 250

## 2020-01-31 MED ORDER — ASPIRIN EC 325 MG PO TBEC: 325.0000 mg | DELAYED_RELEASE_TABLET | Freq: Every day | ORAL | Status: DC

## 2020-01-31 MED ORDER — LACTATED RINGERS IV SOLN: INTRAVENOUS | Status: DC

## 2020-01-31 MED ORDER — SODIUM BICARBONATE 8.4 % IV SOLN
50.0000 meq | Freq: Once | INTRAVENOUS | Status: AC
  Administered 2020-01-31: 50 meq via INTRAVENOUS

## 2020-01-31 MED ORDER — MAGNESIUM SULFATE 4 GM/100ML IV SOLN
INTRAVENOUS | Status: AC
  Filled 2020-01-31: qty 100

## 2020-01-31 MED ORDER — ACETAMINOPHEN 160 MG/5ML PO SOLN
1000.0000 mg | Freq: Four times a day (QID) | ORAL | Status: AC
  Administered 2020-01-31: 1000 mg
  Filled 2020-01-31: qty 40.6

## 2020-01-31 MED ORDER — SODIUM CHLORIDE 0.9 % IV SOLN
1.5000 g | Freq: Two times a day (BID) | INTRAVENOUS | Status: AC
  Administered 2020-01-31 – 2020-02-02 (×4): 1.5 g via INTRAVENOUS
  Filled 2020-01-31 (×4): qty 1.5

## 2020-01-31 MED ORDER — HEPARIN SODIUM (PORCINE) 1000 UNIT/ML IJ SOLN
INTRAMUSCULAR | Status: AC
  Filled 2020-01-31: qty 1

## 2020-01-31 MED ORDER — CHLORHEXIDINE GLUCONATE 0.12 % MT SOLN
15.0000 mL | OROMUCOSAL | Status: AC
  Administered 2020-01-31: 15 mL via OROMUCOSAL
  Filled 2020-01-31: qty 15

## 2020-01-31 MED ORDER — NITROGLYCERIN IN D5W 200-5 MCG/ML-% IV SOLN
7.0000 ug/min | INTRAVENOUS | Status: DC
  Administered 2020-01-31: 20 ug/min via INTRAVENOUS

## 2020-01-31 MED ORDER — PHENYLEPHRINE HCL-NACL 20-0.9 MG/250ML-% IV SOLN
0.0000 ug/min | INTRAVENOUS | Status: DC
  Administered 2020-01-31: 10 ug/min via INTRAVENOUS
  Administered 2020-02-01: 95 ug/min via INTRAVENOUS
  Administered 2020-02-01: 30 ug/min via INTRAVENOUS
  Administered 2020-02-02 (×2): 100 ug/min via INTRAVENOUS
  Administered 2020-02-02 (×2): 80 ug/min via INTRAVENOUS
  Administered 2020-02-02: 100 ug/min via INTRAVENOUS
  Administered 2020-02-03: 60 ug/min via INTRAVENOUS
  Filled 2020-01-31 (×9): qty 250

## 2020-01-31 MED ORDER — PLATELET POOR PLASMA OPTIME
Status: DC | PRN
  Administered 2020-01-31: 10 mL

## 2020-01-31 MED ORDER — MORPHINE SULFATE (PF) 2 MG/ML IV SOLN
1.0000 mg | INTRAVENOUS | Status: DC | PRN
  Administered 2020-01-31 – 2020-02-01 (×7): 2 mg via INTRAVENOUS
  Administered 2020-02-01: 4 mg via INTRAVENOUS
  Filled 2020-01-31 (×2): qty 1
  Filled 2020-01-31: qty 2
  Filled 2020-01-31 (×5): qty 1

## 2020-01-31 MED ORDER — MIDAZOLAM HCL (PF) 10 MG/2ML IJ SOLN
INTRAMUSCULAR | Status: AC
  Filled 2020-01-31: qty 2

## 2020-01-31 MED ORDER — DOCUSATE SODIUM 100 MG PO CAPS
200.0000 mg | ORAL_CAPSULE | Freq: Every day | ORAL | Status: DC
  Administered 2020-02-01 – 2020-02-05 (×5): 200 mg via ORAL
  Filled 2020-01-31 (×5): qty 2

## 2020-01-31 MED ORDER — FAMOTIDINE IN NACL 20-0.9 MG/50ML-% IV SOLN
20.0000 mg | Freq: Two times a day (BID) | INTRAVENOUS | Status: DC
  Administered 2020-01-31: 20 mg via INTRAVENOUS

## 2020-01-31 MED ORDER — PROTAMINE SULFATE 10 MG/ML IV SOLN
INTRAVENOUS | Status: DC | PRN
  Administered 2020-01-31: 10 mg via INTRAVENOUS
  Administered 2020-01-31: 320 mg via INTRAVENOUS

## 2020-01-31 MED ORDER — VANCOMYCIN HCL 1000 MG IV SOLR
INTRAVENOUS | Status: AC
  Filled 2020-01-31: qty 3000

## 2020-01-31 MED ORDER — STERILE WATER FOR INJECTION IJ SOLN
INTRAMUSCULAR | Status: DC | PRN
  Administered 2020-01-31: 10 mL via INTRAVENOUS

## 2020-01-31 MED ORDER — FENTANYL CITRATE (PF) 100 MCG/2ML IJ SOLN
INTRAMUSCULAR | Status: AC
  Filled 2020-01-31: qty 2

## 2020-01-31 MED ORDER — ACETAMINOPHEN 650 MG RE SUPP
650.0000 mg | Freq: Once | RECTAL | Status: AC
  Administered 2020-01-31: 650 mg via RECTAL

## 2020-01-31 MED ORDER — METOPROLOL TARTRATE 25 MG/10 ML ORAL SUSPENSION: 12.5000 mg | Freq: Two times a day (BID) | ORAL | Status: DC

## 2020-01-31 MED ORDER — MAGNESIUM SULFATE 4 GM/100ML IV SOLN
4.0000 g | Freq: Once | INTRAVENOUS | Status: AC
  Administered 2020-01-31: 4 g via INTRAVENOUS

## 2020-01-31 MED ORDER — ORAL CARE MOUTH RINSE
15.0000 mL | OROMUCOSAL | Status: DC
  Administered 2020-01-31: 15 mL via OROMUCOSAL

## 2020-01-31 MED ORDER — DEXTROSE 50 % IV SOLN: 0.0000 mL | INTRAVENOUS | Status: DC | PRN

## 2020-01-31 MED ORDER — EPINEPHRINE HCL 5 MG/250ML IV SOLN IN NS: 0.0000 ug/min | INTRAVENOUS | Status: DC

## 2020-01-31 MED ORDER — LACTATED RINGERS IV SOLN: INTRAVENOUS | Status: DC | PRN

## 2020-01-31 MED ORDER — ROCURONIUM BROMIDE 10 MG/ML (PF) SYRINGE
PREFILLED_SYRINGE | INTRAVENOUS | Status: DC | PRN
  Administered 2020-01-31 (×2): 100 mg via INTRAVENOUS
  Administered 2020-01-31: 40 mg via INTRAVENOUS

## 2020-01-31 MED ORDER — PHENYLEPHRINE 40 MCG/ML (10ML) SYRINGE FOR IV PUSH (FOR BLOOD PRESSURE SUPPORT)
PREFILLED_SYRINGE | INTRAVENOUS | Status: DC | PRN
  Administered 2020-01-31: 120 ug via INTRAVENOUS
  Administered 2020-01-31: 80 ug via INTRAVENOUS

## 2020-01-31 MED ORDER — ALBUMIN HUMAN 5 % IV SOLN: INTRAVENOUS | Status: DC | PRN

## 2020-01-31 MED ORDER — BUPIVACAINE LIPOSOME 1.3 % IJ SUSP
INTRAMUSCULAR | Status: DC | PRN
  Administered 2020-01-31: 50 mL

## 2020-01-31 MED ORDER — FAMOTIDINE IN NACL 20-0.9 MG/50ML-% IV SOLN
INTRAVENOUS | Status: AC
  Filled 2020-01-31: qty 50

## 2020-01-31 MED ORDER — MIDAZOLAM HCL 2 MG/2ML IJ SOLN: 2.0000 mg | INTRAMUSCULAR | Status: DC | PRN

## 2020-01-31 MED ORDER — PROPOFOL 10 MG/ML IV BOLUS
INTRAVENOUS | Status: AC
  Filled 2020-01-31: qty 20

## 2020-01-31 MED ORDER — PHENYLEPHRINE 40 MCG/ML (10ML) SYRINGE FOR IV PUSH (FOR BLOOD PRESSURE SUPPORT)
PREFILLED_SYRINGE | INTRAVENOUS | Status: AC
  Filled 2020-01-31: qty 10

## 2020-01-31 MED ORDER — PHENYLEPHRINE HCL (PRESSORS) 10 MG/ML IV SOLN
INTRAVENOUS | Status: DC | PRN
  Administered 2020-01-31: 80 ug via INTRAVENOUS

## 2020-01-31 MED ORDER — DEXMEDETOMIDINE HCL IN NACL 400 MCG/100ML IV SOLN
0.0000 ug/kg/h | INTRAVENOUS | Status: DC
  Administered 2020-01-31: 0.7 ug/kg/h via INTRAVENOUS
  Administered 2020-02-01: 0.2 ug/kg/h via INTRAVENOUS
  Filled 2020-01-31 (×2): qty 100

## 2020-01-31 MED ORDER — CALCIUM CHLORIDE 10 % IV SOLN
INTRAVENOUS | Status: DC | PRN
  Administered 2020-01-31: 300 mg via INTRAVENOUS
  Administered 2020-01-31: 200 mg via INTRAVENOUS

## 2020-01-31 MED ORDER — METOPROLOL TARTRATE 5 MG/5ML IV SOLN
2.5000 mg | INTRAVENOUS | Status: DC | PRN
  Filled 2020-01-31: qty 5

## 2020-01-31 MED ORDER — PROTAMINE SULFATE 10 MG/ML IV SOLN
INTRAVENOUS | Status: AC
  Filled 2020-01-31: qty 25

## 2020-01-31 MED ORDER — METOPROLOL TARTRATE 12.5 MG HALF TABLET
12.5000 mg | ORAL_TABLET | Freq: Two times a day (BID) | ORAL | Status: DC
  Administered 2020-02-02: 12.5 mg via ORAL
  Filled 2020-01-31 (×3): qty 1

## 2020-01-31 MED ORDER — PLASMA-LYTE 148 IV SOLN
INTRAVENOUS | Status: DC | PRN
  Administered 2020-01-31: 500 mL via INTRAVASCULAR

## 2020-01-31 MED ORDER — BUPIVACAINE HCL (PF) 0.5 % IJ SOLN
INTRAMUSCULAR | Status: AC
  Filled 2020-01-31: qty 30

## 2020-01-31 MED ORDER — SODIUM CHLORIDE 0.9 % IV SOLN: 250.0000 mL | INTRAVENOUS | Status: DC

## 2020-01-31 MED ORDER — FENTANYL CITRATE (PF) 250 MCG/5ML IJ SOLN
INTRAMUSCULAR | Status: DC | PRN
Start: 2020-01-31 — End: 2020-01-31

## 2020-01-31 MED ORDER — ONDANSETRON HCL 4 MG/2ML IJ SOLN: 4.0000 mg | Freq: Four times a day (QID) | INTRAMUSCULAR | Status: DC | PRN

## 2020-01-31 MED ORDER — SODIUM CHLORIDE 0.9 % IV SOLN: INTRAVENOUS | Status: DC | PRN

## 2020-01-31 MED ORDER — HEPARIN SODIUM (PORCINE) 1000 UNIT/ML IJ SOLN
INTRAMUSCULAR | Status: DC | PRN
  Administered 2020-01-31: 33000 [IU] via INTRAVENOUS

## 2020-01-31 MED ORDER — PROTAMINE SULFATE 10 MG/ML IV SOLN
INTRAVENOUS | Status: AC
  Filled 2020-01-31: qty 5

## 2020-01-31 MED ORDER — STERILE WATER FOR INJECTION IV SOLN
INTRAVENOUS | Status: DC | PRN
  Administered 2020-01-31: 30 mL

## 2020-01-31 MED ORDER — PROPOFOL 10 MG/ML IV BOLUS
INTRAVENOUS | Status: DC | PRN
  Administered 2020-01-31: 50 mg via INTRAVENOUS

## 2020-01-31 MED ORDER — ASPIRIN 81 MG PO CHEW: 324.0000 mg | CHEWABLE_TABLET | Freq: Every day | ORAL | Status: DC

## 2020-01-31 MED ORDER — METOCLOPRAMIDE HCL 5 MG/ML IJ SOLN
10.0000 mg | Freq: Four times a day (QID) | INTRAMUSCULAR | Status: AC
  Administered 2020-01-31 – 2020-02-01 (×3): 10 mg via INTRAVENOUS
  Filled 2020-01-31 (×3): qty 2

## 2020-01-31 MED ORDER — 0.9 % SODIUM CHLORIDE (POUR BTL) OPTIME
TOPICAL | Status: DC | PRN
  Administered 2020-01-31: 6000 mL

## 2020-01-31 MED ORDER — LACTATED RINGERS IV SOLN: 500.0000 mL | Freq: Once | INTRAVENOUS | Status: DC | PRN

## 2020-01-31 MED ORDER — PLATELET RICH PLASMA OPTIME
Status: DC | PRN
  Administered 2020-01-31: 10 mL

## 2020-01-31 MED ORDER — ACETAMINOPHEN 160 MG/5ML PO SOLN: 650.0000 mg | Freq: Once | ORAL | Status: AC

## 2020-01-31 MED ORDER — CHLORHEXIDINE GLUCONATE 0.12% ORAL RINSE (MEDLINE KIT)
15.0000 mL | Freq: Two times a day (BID) | OROMUCOSAL | Status: DC
  Administered 2020-01-31: 15 mL via OROMUCOSAL

## 2020-01-31 MED ORDER — TRAMADOL HCL 50 MG PO TABS
50.0000 mg | ORAL_TABLET | ORAL | Status: DC | PRN
  Administered 2020-02-05: 100 mg via ORAL
  Filled 2020-01-31: qty 2

## 2020-01-31 MED ORDER — SODIUM CHLORIDE 0.9 % IV SOLN: INTRAVENOUS | Status: DC

## 2020-01-31 MED ORDER — SODIUM CHLORIDE 0.9% FLUSH: 3.0000 mL | INTRAVENOUS | Status: DC | PRN

## 2020-01-31 MED ORDER — OXYCODONE HCL 5 MG PO TABS
5.0000 mg | ORAL_TABLET | ORAL | Status: DC | PRN
  Administered 2020-02-01 (×4): 10 mg via ORAL
  Filled 2020-01-31 (×4): qty 2

## 2020-01-31 MED ORDER — ALBUMIN HUMAN 25 % IV SOLN
12.5000 g | Freq: Once | INTRAVENOUS | Status: AC
  Administered 2020-01-31: 12.5 g via INTRAVENOUS

## 2020-01-31 MED ORDER — SODIUM CHLORIDE (PF) 0.9 % IJ SOLN
OROMUCOSAL | Status: DC | PRN
  Administered 2020-01-31 (×2): 4 mL via TOPICAL

## 2020-01-31 MED ORDER — MIDAZOLAM HCL 5 MG/5ML IJ SOLN
INTRAMUSCULAR | Status: DC | PRN
  Administered 2020-01-31 (×3): 2 mg via INTRAVENOUS
  Administered 2020-01-31: 4 mg via INTRAVENOUS
  Administered 2020-01-31: 2 mg via INTRAVENOUS

## 2020-01-31 MED ORDER — FENTANYL CITRATE (PF) 250 MCG/5ML IJ SOLN
INTRAMUSCULAR | Status: AC
  Filled 2020-01-31: qty 10

## 2020-01-31 MED ORDER — HEMOSTATIC AGENTS (NO CHARGE) OPTIME
TOPICAL | Status: DC | PRN
  Administered 2020-01-31 (×2): 1 via TOPICAL

## 2020-01-31 MED ORDER — VANCOMYCIN HCL IN DEXTROSE 1-5 GM/200ML-% IV SOLN
1000.0000 mg | Freq: Once | INTRAVENOUS | Status: AC
  Administered 2020-01-31: 1000 mg via INTRAVENOUS
  Filled 2020-01-31: qty 200

## 2020-01-31 MED ORDER — VANCOMYCIN HCL 1000 MG IV SOLR
INTRAVENOUS | Status: DC | PRN
  Administered 2020-01-31: 3000 mg via TOPICAL

## 2020-01-31 MED ORDER — BUPIVACAINE LIPOSOME 1.3 % IJ SUSP
20.0000 mL | Freq: Once | INTRAMUSCULAR | Status: DC
  Filled 2020-01-31: qty 20

## 2020-01-31 MED ORDER — ACETAMINOPHEN 500 MG PO TABS
1000.0000 mg | ORAL_TABLET | Freq: Four times a day (QID) | ORAL | Status: AC
  Administered 2020-02-01 – 2020-02-05 (×19): 1000 mg via ORAL
  Filled 2020-01-31 (×19): qty 2

## 2020-01-31 MED ORDER — POTASSIUM CHLORIDE 10 MEQ/50ML IV SOLN: 10.0000 meq | INTRAVENOUS | Status: AC

## 2020-01-31 MED ORDER — SODIUM CHLORIDE 0.9% FLUSH
3.0000 mL | Freq: Two times a day (BID) | INTRAVENOUS | Status: DC
  Administered 2020-02-01 – 2020-02-05 (×9): 3 mL via INTRAVENOUS

## 2020-01-31 MED ORDER — CALCIUM CHLORIDE 10 % IV SOLN
INTRAVENOUS | Status: AC
  Filled 2020-01-31: qty 10

## 2020-01-31 MED ORDER — STERILE WATER FOR INJECTION IJ SOLN
INTRAMUSCULAR | Status: AC
  Filled 2020-01-31: qty 10

## 2020-01-31 MED ORDER — BISACODYL 5 MG PO TBEC
10.0000 mg | DELAYED_RELEASE_TABLET | Freq: Every day | ORAL | Status: DC
  Administered 2020-02-01 – 2020-02-05 (×5): 10 mg via ORAL
  Filled 2020-01-31 (×5): qty 2

## 2020-01-31 MED ORDER — PANTOPRAZOLE SODIUM 40 MG PO TBEC
40.0000 mg | DELAYED_RELEASE_TABLET | Freq: Every day | ORAL | Status: DC
  Administered 2020-02-02 – 2020-02-06 (×5): 40 mg via ORAL
  Filled 2020-01-31 (×5): qty 1

## 2020-01-31 MED ORDER — BISACODYL 10 MG RE SUPP: 10.0000 mg | Freq: Every day | RECTAL | Status: DC

## 2020-01-31 MED ORDER — SODIUM CHLORIDE 0.45 % IV SOLN: INTRAVENOUS | Status: DC | PRN

## 2020-01-31 MED ORDER — MIDAZOLAM HCL 2 MG/2ML IJ SOLN
INTRAMUSCULAR | Status: AC
  Filled 2020-01-31: qty 2

## 2020-01-31 MED ORDER — INSULIN REGULAR(HUMAN) IN NACL 100-0.9 UT/100ML-% IV SOLN
INTRAVENOUS | Status: DC
  Administered 2020-01-31: 0.2 [IU]/h via INTRAVENOUS

## 2020-01-31 MED ORDER — FENTANYL CITRATE (PF) 250 MCG/5ML IJ SOLN
INTRAMUSCULAR | Status: AC
Start: 2020-01-31 — End: ?
  Filled 2020-01-31: qty 20

## 2020-01-31 MED ORDER — ALBUMIN HUMAN 5 % IV SOLN
12.5000 g | Freq: Once | INTRAVENOUS | Status: AC
  Administered 2020-01-31: 12.5 g via INTRAVENOUS

## 2020-01-31 MED ORDER — ROCURONIUM BROMIDE 10 MG/ML (PF) SYRINGE
PREFILLED_SYRINGE | INTRAVENOUS | Status: AC
  Filled 2020-01-31: qty 20

## 2020-01-31 MED ORDER — CHLORHEXIDINE GLUCONATE CLOTH 2 % EX PADS
6.0000 | MEDICATED_PAD | Freq: Every day | CUTANEOUS | Status: DC
  Administered 2020-01-31 – 2020-02-05 (×4): 6 via TOPICAL

## 2020-01-31 SURGICAL SUPPLY — 90 items
ADAPTER CARDIO PERF ANTE/RETRO (ADAPTER) ×4 IMPLANT
APPLICATOR TIP COSEAL (VASCULAR PRODUCTS) ×4 IMPLANT
APPLIER CLIP 9.375 SM OPEN (CLIP) ×4
BAG DECANTER FOR FLEXI CONT (MISCELLANEOUS) ×4 IMPLANT
BLADE CLIPPER SURG (BLADE) ×4 IMPLANT
BLADE STERNUM SYSTEM 6 (BLADE) ×4 IMPLANT
BLADE SURG 15 STRL LF DISP TIS (BLADE) ×3 IMPLANT
BLADE SURG 15 STRL SS (BLADE) ×4
BNDG ELASTIC 4X5.8 VLCR STR LF (GAUZE/BANDAGES/DRESSINGS) ×4 IMPLANT
BNDG GAUZE ELAST 4 BULKY (GAUZE/BANDAGES/DRESSINGS) ×4 IMPLANT
CANISTER SUCT 3000ML PPV (MISCELLANEOUS) ×4 IMPLANT
CATH CPB KIT HENDRICKSON (MISCELLANEOUS) ×4 IMPLANT
CATH ROBINSON RED A/P 18FR (CATHETERS) ×8 IMPLANT
CLIP APPLIE 9.375 SM OPEN (CLIP) ×3 IMPLANT
CNTNR URN SCR LID CUP LEK RST (MISCELLANEOUS) ×3 IMPLANT
CONN ST 1/4X3/8  BEN (MISCELLANEOUS) ×4
CONN ST 1/4X3/8 BEN (MISCELLANEOUS) ×3 IMPLANT
CONT SPEC 4OZ STRL OR WHT (MISCELLANEOUS) ×4
COVER MAYO STAND STRL (DRAPES) ×4 IMPLANT
DERMABOND ADHESIVE PROPEN (GAUZE/BANDAGES/DRESSINGS) ×1
DERMABOND ADVANCED .7 DNX6 (GAUZE/BANDAGES/DRESSINGS) ×3 IMPLANT
DRAIN CHANNEL 28F RND 3/8 FF (WOUND CARE) ×12 IMPLANT
DRAPE CARDIOVASCULAR INCISE (DRAPES) ×4
DRAPE EXTREMITY T 121X128X90 (DISPOSABLE) ×4 IMPLANT
DRAPE HALF SHEET 40X57 (DRAPES) ×4 IMPLANT
DRAPE SLUSH/WARMER DISC (DRAPES) ×4 IMPLANT
DRAPE SRG 135X102X78XABS (DRAPES) ×3 IMPLANT
DRSG AQUACEL AG ADV 3.5X14 (GAUZE/BANDAGES/DRESSINGS) ×4 IMPLANT
ELECT CAUTERY BLADE 6.4 (BLADE) ×4 IMPLANT
ELECT REM PT RETURN 9FT ADLT (ELECTROSURGICAL) ×8
ELECTRODE REM PT RTRN 9FT ADLT (ELECTROSURGICAL) ×6 IMPLANT
FELT TEFLON 1X6 (MISCELLANEOUS) ×4 IMPLANT
GAUZE SPONGE 4X4 12PLY STRL (GAUZE/BANDAGES/DRESSINGS) ×8 IMPLANT
GAUZE SPONGE 4X4 12PLY STRL LF (GAUZE/BANDAGES/DRESSINGS) ×8 IMPLANT
GEL ULTRASOUND 20GR AQUASONIC (MISCELLANEOUS) ×4 IMPLANT
GLOVE BIO SURGEON STRL SZ 6.5 (GLOVE) ×32 IMPLANT
GLOVE NEODERM STRL 7.5  LF PF (GLOVE) ×9
GLOVE NEODERM STRL 7.5 LF PF (GLOVE) ×9 IMPLANT
GLOVE SURG NEODERM 7.5  LF PF (GLOVE) ×3
GOWN STRL REUS W/ TWL LRG LVL3 (GOWN DISPOSABLE) ×36 IMPLANT
GOWN STRL REUS W/TWL LRG LVL3 (GOWN DISPOSABLE) ×48
HEMOSTAT POWDER SURGIFOAM 1G (HEMOSTASIS) ×8 IMPLANT
INSERT SUTURE HOLDER (MISCELLANEOUS) ×4 IMPLANT
KIT APPLICATOR RATIO 11:1 (KITS) ×4 IMPLANT
KIT BASIN OR (CUSTOM PROCEDURE TRAY) ×4 IMPLANT
KIT SUCTION CATH 14FR (SUCTIONS) ×4 IMPLANT
KIT TURNOVER KIT B (KITS) ×4 IMPLANT
NEEDLE 18GX1X1/2 (RX/OR ONLY) (NEEDLE) ×8 IMPLANT
NS IRRIG 1000ML POUR BTL (IV SOLUTION) ×20 IMPLANT
PACK E OPEN HEART (SUTURE) ×4 IMPLANT
PACK OPEN HEART (CUSTOM PROCEDURE TRAY) ×4 IMPLANT
PACK PLATELET PROCEDURE 60 (MISCELLANEOUS) ×4 IMPLANT
PACK SPY-PHI (KITS) ×4 IMPLANT
PAD ARMBOARD 7.5X6 YLW CONV (MISCELLANEOUS) ×8 IMPLANT
PAD ELECT DEFIB RADIOL ZOLL (MISCELLANEOUS) ×4 IMPLANT
PENCIL BUTTON HOLSTER BLD 10FT (ELECTRODE) ×4 IMPLANT
POSITIONER HEAD DONUT 9IN (MISCELLANEOUS) ×4 IMPLANT
POWDER SURGICEL 3.0 GRAM (HEMOSTASIS) ×4 IMPLANT
PUNCH AORTIC ROTATE 4.0MM (MISCELLANEOUS) ×4 IMPLANT
SEALANT SURG COSEAL 8ML (VASCULAR PRODUCTS) ×4 IMPLANT
SET CARDIOPLEGIA MPS 5001102 (MISCELLANEOUS) ×4 IMPLANT
SHEARS HARMONIC 9CM CVD (BLADE) ×4 IMPLANT
SPONGE LAP 18X18 RF (DISPOSABLE) ×4 IMPLANT
STAPLER VISISTAT 35W (STAPLE) ×4 IMPLANT
SUPPORT HEART JANKE-BARRON (MISCELLANEOUS) ×4 IMPLANT
SUT BONE WAX W31G (SUTURE) ×4 IMPLANT
SUT MNCRL AB 3-0 PS2 18 (SUTURE) ×8 IMPLANT
SUT PDS AB 1 CTX 36 (SUTURE) ×8 IMPLANT
SUT PROLENE 3 0 SH DA (SUTURE) ×4 IMPLANT
SUT PROLENE 4 0 RB 1 (SUTURE) ×12
SUT PROLENE 4-0 RB1 .5 CRCL 36 (SUTURE) ×9 IMPLANT
SUT PROLENE 6 0 C 1 30 (SUTURE) ×12 IMPLANT
SUT PROLENE 7 0 BV 1 (SUTURE) ×4 IMPLANT
SUT PROLENE 8 0 BV175 6 (SUTURE) ×8 IMPLANT
SUT PROLENE BLUE 7 0 (SUTURE) ×4 IMPLANT
SUT SILK  1 MH (SUTURE) ×8
SUT SILK 1 MH (SUTURE) ×6 IMPLANT
SUT STEEL 6MS V (SUTURE) ×4 IMPLANT
SUT STEEL SZ 6 DBL 3X14 BALL (SUTURE) ×4 IMPLANT
SUT VIC AB 2-0 CT1 27 (SUTURE) ×4
SUT VIC AB 2-0 CT1 TAPERPNT 27 (SUTURE) ×3 IMPLANT
SYR 30ML LL (SYRINGE) ×8 IMPLANT
SYR 3ML LL SCALE MARK (SYRINGE) ×4 IMPLANT
SYSTEM SAHARA CHEST DRAIN ATS (WOUND CARE) ×4 IMPLANT
TIP DUAL SPRAY TOPICAL (TIP) ×8 IMPLANT
TOWEL GREEN STERILE (TOWEL DISPOSABLE) ×4 IMPLANT
TOWEL GREEN STERILE FF (TOWEL DISPOSABLE) ×4 IMPLANT
TRAY FOLEY SLVR 16FR TEMP STAT (SET/KITS/TRAYS/PACK) ×4 IMPLANT
UNDERPAD 30X36 HEAVY ABSORB (UNDERPADS AND DIAPERS) ×4 IMPLANT
WATER STERILE IRR 1000ML POUR (IV SOLUTION) ×8 IMPLANT

## 2020-01-31 NOTE — Progress Notes (Signed)
RT flipped pt on CPAP/PS per RWP at 20:31. Will draw ABG in 62min. Pt tolerating well at this time.

## 2020-01-31 NOTE — H&P (Signed)
History and Physical Interval Note:  01/31/2020 7:38 AM  Daniel [name]  has presented today for surgery, with the diagnosis of CAD.  The various methods of treatment have been discussed with the patient and family. After consideration of risks, benefits and other options for treatment, the patient has consented to  Procedure(s) with comments: CORONARY ARTERY BYPASS GRAFTING (CABG) (N/A) - POSSIBLE BILATERAL IMA RADIAL ARTERY HARVEST (Left) TRANSESOPHAGEAL ECHOCARDIOGRAM (TEE) (N/A) INDOCYANINE GREEN FLUORESCENCE IMAGING (ICG) (N/A) as a surgical intervention.  The patient's history has been reviewed, patient examined, no change in status, stable for surgery.  I have reviewed the patient's chart and labs.  Questions were answered to the patient's satisfaction.     [name]

## 2020-01-31 NOTE — Anesthesia Procedure Notes (Signed)
Central Venous Catheter Insertion Performed by: Albertha Ghee, MD, anesthesiologist Start/End9/23/2021 6:40 AM, 01/31/2020 6:52 AM Patient location: Pre-op. Preanesthetic checklist: patient identified, IV checked, site marked, risks and benefits discussed, surgical consent, monitors and equipment checked, pre-op evaluation, timeout performed and anesthesia consent Position: Trendelenburg Lidocaine 1% used for infiltration and patient sedated Hand hygiene performed , maximum sterile barriers used  and Seldinger technique used Catheter size: 8.5 Fr Central line and PA cath was placed.Sheath introducer Swan type:thermodilation Procedure performed using ultrasound guided technique. Ultrasound Notes:anatomy identified, needle tip was noted to be adjacent to the nerve/plexus identified, no ultrasound evidence of intravascular and/or intraneural injection and image(s) printed for medical record Attempts: 1 Following insertion, line sutured, dressing applied and Biopatch. Post procedure assessment: blood return through all ports, free fluid flow and no air  Patient tolerated the procedure well with no immediate complications.

## 2020-01-31 NOTE — Progress Notes (Signed)
This note also relates to the following rows which could not be included: SpO2 - Cannot attach notes to unvalidated device data  RT NOTE: Patient unable to perform VC. Per MD per ABG results pt placed back on original settings with RR increased to 16. Bicarb to be given and rapid weaning to be reattempted in one hour. RT will continue to monitor.

## 2020-01-31 NOTE — Transfer of Care (Signed)
Immediate Anesthesia Transfer of Care Note  Patient: Daniel [name]  Procedure(s) Performed: CORONARY ARTERY BYPASS GRAFTING (CABG) TIMES FIVE USING BOTH INTERNAL MAMMARY ARTERIES AND LEFT RADIAL ARTERY. RIGHT CORONARY ENDARTERECTOMY (N/A Chest) RADIAL ARTERY HARVEST (Left Arm Lower) TRANSESOPHAGEAL ECHOCARDIOGRAM (TEE) (N/A ) INDOCYANINE GREEN FLUORESCENCE IMAGING (ICG) (N/A )  Patient Location: SICU  Anesthesia Type:General  Level of Consciousness: sedated and Patient remains intubated per anesthesia plan  Airway & Oxygen Therapy: Patient remains intubated per anesthesia plan and Patient placed on Ventilator (see vital sign flow sheet for setting)  Post-op Assessment: Report given to RN and Post -op Vital signs reviewed and stable  Post vital signs: Reviewed and stable  Last Vitals:  Vitals Value Taken Time  BP    Temp    Pulse 70 01/31/20 1413  Resp 14 01/31/20 1413  SpO2 100 % 01/31/20 1413  Vitals shown include unvalidated device data.  Last Pain:  Vitals:   01/30/20 2324  TempSrc: Oral  PainSc:       Patients Stated Pain Goal: 0 (04/27/74 8832)  Complications: No complications documented.

## 2020-01-31 NOTE — Anesthesia Postprocedure Evaluation (Signed)
Anesthesia Post Note  Patient: Daniel [name]  Procedure(s) Performed: CORONARY ARTERY BYPASS GRAFTING (CABG) TIMES FIVE USING BOTH INTERNAL MAMMARY ARTERIES AND LEFT RADIAL ARTERY. RIGHT CORONARY ENDARTERECTOMY (N/A Chest) RADIAL ARTERY HARVEST (Left Arm Lower) TRANSESOPHAGEAL ECHOCARDIOGRAM (TEE) (N/A ) INDOCYANINE GREEN FLUORESCENCE IMAGING (ICG) (N/A )     Patient location during evaluation: PACU Anesthesia Type: General Level of consciousness: sedated and patient remains intubated per anesthesia plan Pain management: pain level controlled Vital Signs Assessment: post-procedure vital signs reviewed and stable Respiratory status: patient remains intubated per anesthesia plan Cardiovascular status: stable Anesthetic complications: no   No complications documented.  Last Vitals:  Vitals:   01/31/20 2000 01/31/20 2002  BP: (!) 87/68 (!) 96/56  Pulse: 94 94  Resp: 19 (!) 25  Temp: (!) 38.2 C   SpO2: 100% 100%    Last Pain:  Vitals:   01/30/20 2324  TempSrc: Oral  PainSc:                  Daniel [name]

## 2020-01-31 NOTE — Anesthesia Procedure Notes (Signed)
Arterial Line Insertion Start/End9/23/2021 6:40 AM, 01/31/2020 6:43 AM Performed by: Verdie Drown, CRNA, CRNA  Patient location: Pre-op. Preanesthetic checklist: patient identified, IV checked, site marked, risks and benefits discussed, surgical consent, monitors and equipment checked, pre-op evaluation, timeout performed and anesthesia consent Lidocaine 1% used for infiltration and patient sedated Right, radial was placed Catheter size: 20 G Hand hygiene performed , maximum sterile barriers used  and Seldinger technique used  Attempts: 1 Procedure performed without using ultrasound guided technique. Following insertion, dressing applied and Biopatch. Post procedure assessment: normal  Patient tolerated the procedure well with no immediate complications.

## 2020-01-31 NOTE — Progress Notes (Signed)
Echocardiogram Echocardiogram Transesophageal has been performed.  Daniel [name] 01/31/2020, 9:02 AM

## 2020-01-31 NOTE — Progress Notes (Signed)
Pt more awake on assessment, notified readiness to wean to RT. RT placed pt on 40/4 per RWP at 20:00.   Will monitor closely.  Henreitta Leber, RN

## 2020-01-31 NOTE — Plan of Care (Signed)

## 2020-01-31 NOTE — Brief Op Note (Signed)
01/28/2020 - 01/31/2020  12:28 PM  PATIENT:  Daniel [name]  69 y.o. male  PRE-OPERATIVE DIAGNOSIS:  CAD  POST-OPERATIVE DIAGNOSIS:  CAD  PROCEDURE:  Procedure(s):  CORONARY ARTERY BYPASS GRAFTING x 5 -LIMA to LAD -RADIAL ARTERY SEQUENCE to OM 1, OM 2, and RAMUS INTERMEDIATE -RIMA to DISTAL RCA with Endarterectomy  OPEN RADIAL ARTERY HARVEST (Left) -harvest time 25 min/prep time 10  TRANSESOPHAGEAL ECHOCARDIOGRAM (TEE) (N/A)  INDOCYANINE GREEN FLUORESCENCE IMAGING (ICG) (N/A)  SURGEON:  Surgeon(s) and Role:    * [name], MD - Primary  PHYSICIAN ASSISTANT: Ellwood Handler PA-C  ASSISTANTS: Ardyth Man RNFA   ANESTHESIA:   general  EBL:  Per anes/perfusion record  BLOOD ADMINISTERED: CELLSAVER  DRAINS: Left and Right Pleural Chest Tubes, Mediastinal Chest Drains   LOCAL MEDICATIONS USED:  BUPIVICAINE   SPECIMEN:  No Specimen  DISPOSITION OF SPECIMEN:  N/A  COUNTS:  YES  TOURNIQUET:  * No tourniquets in log *  DICTATION: .Dragon Dictation  PLAN OF CARE: Admit to inpatient   PATIENT DISPOSITION:  ICU - intubated and hemodynamically stable.   Delay start of Pharmacological VTE agent (>24hrs) due to surgical blood loss or risk of bleeding: yes

## 2020-01-31 NOTE — Progress Notes (Signed)
TCTS BRIEF SICU PROGRESS NOTE  Day of Surgery  S/P Procedure(s) (LRB): CORONARY ARTERY BYPASS GRAFTING (CABG) TIMES FIVE USING BOTH INTERNAL MAMMARY ARTERIES AND LEFT RADIAL ARTERY. RIGHT CORONARY ENDARTERECTOMY (N/A) RADIAL ARTERY HARVEST (Left) TRANSESOPHAGEAL ECHOCARDIOGRAM (TEE) (N/A) INDOCYANINE GREEN FLUORESCENCE IMAGING (ICG) (N/A)   Waking up on vent AV paced w/ stable hemodynamics on Epi and Neo O2 sats 100% Chest tube output 50 mL last hour UOP adequate Labs okay  Plan: Continue routine early postop  Rexene Alberts, MD 01/31/2020 6:49 PM

## 2020-01-31 NOTE — Procedures (Signed)
Extubation Procedure Note  Patient Details:   Name: Daniel [name] DOB: 09/15/1950 MRN: [id number]   Airway Documentation:  Airway 8 mm (Active)  Secured at (cm) 24 cm 01/31/20 2002  Measured From Lips 01/31/20 2002  Secured Location Right 01/31/20 2002  Secured By [employer] Tape 01/31/20 2002  Cuff Pressure (cm H2O) 30 cm H2O 01/31/20 2002  Site Condition Dry 01/31/20 1406   Vent end date: (not recorded) Vent end time: (not recorded)   Evaluation  O2 sats: stable throughout Complications: No apparent complications Patient did tolerate procedure well. Bilateral Breath Sounds: Clear, Diminished   Yes  Pt. Extubated to 5L [place]. VS stable. Pt. VC 642mL, NIF -26. Pt. Able to say name after extubation,no stridor noted.   Gifford Shave 01/31/2020, 9:31 PM

## 2020-01-31 NOTE — Progress Notes (Signed)
Rapid weaning protocol 

## 2020-02-01 ENCOUNTER — Encounter (HOSPITAL_COMMUNITY): Payer: Self-pay | Admitting: Cardiothoracic Surgery

## 2020-02-01 ENCOUNTER — Inpatient Hospital Stay (HOSPITAL_COMMUNITY): Payer: No Typology Code available for payment source

## 2020-02-01 LAB — BASIC METABOLIC PANEL
Anion gap: 7 (ref 5–15)
Anion gap: 9 (ref 5–15)
BUN: 13 mg/dL (ref 8–23)
BUN: 18 mg/dL (ref 8–23)
CO2: 23 mmol/L (ref 22–32)
CO2: 22 mmol/L (ref 22–32)
Calcium: 8 mg/dL — ABNORMAL LOW (ref 8.9–10.3)
Calcium: 8 mg/dL — ABNORMAL LOW (ref 8.9–10.3)
Chloride: 110 mmol/L (ref 98–111)
Chloride: 109 mmol/L (ref 98–111)
Creatinine, Ser: 0.95 mg/dL (ref 0.61–1.24)
Creatinine, Ser: 1.31 mg/dL — ABNORMAL HIGH (ref 0.61–1.24)
GFR calc Af Amer: >60 mL/min (ref >60)
GFR calc Af Amer: >60 mL/min (ref >60)
GFR calc non Af Amer: >60 mL/min (ref >60)
GFR calc non Af Amer: 56 mL/min — ABNORMAL LOW (ref >60)
Glucose, Bld: 127 mg/dL — ABNORMAL HIGH (ref 70–99)
Glucose, Bld: 121 mg/dL — ABNORMAL HIGH (ref 70–99)
Potassium: 4.4 mmol/L (ref 3.5–5.1)
Potassium: 4.8 mmol/L (ref 3.5–5.1)
Sodium: 140 mmol/L (ref 135–145)
Sodium: 140 mmol/L (ref 135–145)

## 2020-02-01 LAB — PREPARE RBC (CROSSMATCH)

## 2020-02-01 LAB — POCT I-STAT 7, (LYTES, BLD GAS, ICA,H+H)
Acid-base deficit: 4 mmol/L — ABNORMAL HIGH (ref 0.0–2.0)
Bicarbonate: 21.9 mmol/L (ref 20.0–28.0)
Calcium, Ion: 1.2 mmol/L (ref 1.15–1.40)
HCT: 23 % — ABNORMAL LOW (ref 39.0–52.0)
Hemoglobin: 7.8 g/dL — ABNORMAL LOW (ref 13.0–17.0)
O2 Saturation: 91 %
Patient temperature: 37.7
Potassium: 4.3 mmol/L (ref 3.5–5.1)
Sodium: 143 mmol/L (ref 135–145)
TCO2: 23 mmol/L (ref 22–32)
pCO2 arterial: 47 mmHg (ref 32.0–48.0)
pH, Arterial: 7.279 — ABNORMAL LOW (ref 7.350–7.450)
pO2, Arterial: 72 mmHg — ABNORMAL LOW (ref 83.0–108.0)

## 2020-02-01 LAB — GLUCOSE, CAPILLARY
Glucose-Capillary: 100 mg/dL — ABNORMAL HIGH (ref 70–99)
Glucose-Capillary: 112 mg/dL — ABNORMAL HIGH (ref 70–99)
Glucose-Capillary: 119 mg/dL — ABNORMAL HIGH (ref 70–99)
Glucose-Capillary: 122 mg/dL — ABNORMAL HIGH (ref 70–99)
Glucose-Capillary: 123 mg/dL — ABNORMAL HIGH (ref 70–99)
Glucose-Capillary: 128 mg/dL — ABNORMAL HIGH (ref 70–99)
Glucose-Capillary: 129 mg/dL — ABNORMAL HIGH (ref 70–99)
Glucose-Capillary: 133 mg/dL — ABNORMAL HIGH (ref 70–99)
Glucose-Capillary: 146 mg/dL — ABNORMAL HIGH (ref 70–99)
Glucose-Capillary: 152 mg/dL — ABNORMAL HIGH (ref 70–99)
Glucose-Capillary: 158 mg/dL — ABNORMAL HIGH (ref 70–99)
Glucose-Capillary: 81 mg/dL (ref 70–99)

## 2020-02-01 LAB — CBC
HCT: 24.7 % — ABNORMAL LOW (ref 39.0–52.0)
HCT: 23.1 % — ABNORMAL LOW (ref 39.0–52.0)
Hemoglobin: 7.8 g/dL — ABNORMAL LOW (ref 13.0–17.0)
Hemoglobin: 7.3 g/dL — ABNORMAL LOW (ref 13.0–17.0)
MCH: 29 pg (ref 26.0–34.0)
MCH: 30.2 pg (ref 26.0–34.0)
MCHC: 31.6 g/dL (ref 30.0–36.0)
MCHC: 31.6 g/dL (ref 30.0–36.0)
MCV: 91.8 fL (ref 80.0–100.0)
MCV: 95.5 fL (ref 80.0–100.0)
Platelets: 131 10*3/uL — ABNORMAL LOW (ref 150–400)
Platelets: 120 10*3/uL — ABNORMAL LOW (ref 150–400)
RBC: 2.69 MIL/uL — ABNORMAL LOW (ref 4.22–5.81)
RBC: 2.42 MIL/uL — ABNORMAL LOW (ref 4.22–5.81)
RDW: 13.1 % (ref 11.5–15.5)
RDW: 13.4 % (ref 11.5–15.5)
WBC: 9.4 10*3/uL (ref 4.0–10.5)
WBC: 11.2 10*3/uL — ABNORMAL HIGH (ref 4.0–10.5)
nRBC: 0 % (ref 0.0–0.2)
nRBC: 0 % (ref 0.0–0.2)

## 2020-02-01 LAB — BPAM PLATELET PHERESIS
Blood Product Expiration Date: 202109242359
ISSUE DATE / TIME: 202109231237
Unit Type and Rh: 600

## 2020-02-01 LAB — PREPARE PLATELET PHERESIS: Unit division: 0

## 2020-02-01 LAB — MAGNESIUM
Magnesium: 2.3 mg/dL (ref 1.7–2.4)
Magnesium: 2 mg/dL (ref 1.7–2.4)

## 2020-02-01 LAB — SURGICAL PATHOLOGY

## 2020-02-01 MED ORDER — KETOROLAC TROMETHAMINE 15 MG/ML IJ SOLN
7.5000 mg | Freq: Four times a day (QID) | INTRAMUSCULAR | Status: DC
  Administered 2020-02-01 – 2020-02-02 (×3): 7.5 mg via INTRAVENOUS
  Filled 2020-02-01 (×3): qty 1

## 2020-02-01 MED ORDER — ORAL CARE MOUTH RINSE
15.0000 mL | Freq: Two times a day (BID) | OROMUCOSAL | Status: DC
  Administered 2020-02-03 – 2020-02-06 (×6): 15 mL via OROMUCOSAL

## 2020-02-01 MED ORDER — SODIUM CHLORIDE 0.9% IV SOLUTION: Freq: Once | INTRAVENOUS | Status: AC

## 2020-02-01 MED ORDER — METOCLOPRAMIDE HCL 5 MG/ML IJ SOLN
10.0000 mg | Freq: Four times a day (QID) | INTRAMUSCULAR | Status: AC
  Administered 2020-02-01 – 2020-02-02 (×4): 10 mg via INTRAVENOUS
  Filled 2020-02-01 (×3): qty 2

## 2020-02-01 MED ORDER — FUROSEMIDE 10 MG/ML IJ SOLN
20.0000 mg | Freq: Two times a day (BID) | INTRAMUSCULAR | Status: DC
  Administered 2020-02-01: 20 mg via INTRAVENOUS
  Filled 2020-02-01: qty 2

## 2020-02-01 MED ORDER — INSULIN ASPART 100 UNIT/ML ~~LOC~~ SOLN: 0.0000 [IU] | SUBCUTANEOUS | Status: DC

## 2020-02-01 MED ORDER — ISOSORBIDE DINITRATE 10 MG PO TABS
10.0000 mg | ORAL_TABLET | Freq: Three times a day (TID) | ORAL | Status: DC
  Administered 2020-02-01 – 2020-02-06 (×17): 10 mg via ORAL
  Filled 2020-02-01 (×17): qty 1

## 2020-02-01 MED ORDER — ASPIRIN EC 81 MG PO TBEC
81.0000 mg | DELAYED_RELEASE_TABLET | Freq: Every day | ORAL | Status: DC
  Administered 2020-02-01 – 2020-02-06 (×6): 81 mg via ORAL
  Filled 2020-02-01 (×6): qty 1

## 2020-02-01 MED ORDER — DIAZEPAM 2 MG PO TABS
2.0000 mg | ORAL_TABLET | Freq: Three times a day (TID) | ORAL | Status: AC
  Administered 2020-02-01 – 2020-02-03 (×5): 2 mg via ORAL
  Filled 2020-02-01 (×5): qty 1

## 2020-02-01 MED ORDER — FUROSEMIDE 10 MG/ML IJ SOLN: 20.0000 mg | Freq: Once | INTRAMUSCULAR | Status: DC

## 2020-02-01 MED FILL — Magnesium Sulfate Inj 50%: INTRAMUSCULAR | Qty: 10 | Status: AC

## 2020-02-01 MED FILL — Calcium Chloride Inj 10%: INTRAVENOUS | Qty: 10 | Status: AC

## 2020-02-01 MED FILL — Potassium Chloride Inj 2 mEq/ML: INTRAVENOUS | Qty: 40 | Status: AC

## 2020-02-01 MED FILL — Thrombin For Soln 5000 Unit: CUTANEOUS | Qty: 5000 | Status: AC

## 2020-02-01 MED FILL — Sodium Bicarbonate IV Soln 8.4%: INTRAVENOUS | Qty: 50 | Status: AC

## 2020-02-01 MED FILL — Heparin Sodium (Porcine) Inj 1000 Unit/ML: INTRAMUSCULAR | Qty: 30 | Status: AC

## 2020-02-01 MED FILL — Electrolyte-R (PH 7.4) Solution: INTRAVENOUS | Qty: 4000 | Status: AC

## 2020-02-01 MED FILL — Mannitol IV Soln 20%: INTRAVENOUS | Qty: 500 | Status: AC

## 2020-02-01 MED FILL — Sodium Chloride IV Soln 0.9%: INTRAVENOUS | Qty: 2000 | Status: AC

## 2020-02-01 NOTE — Progress Notes (Signed)
CT surgery p.m. Rounds  Patient up in chair today and ambulated in hallway as well P.m. hemoglobin 7.3-patient receiving packed cells and will follow with Lasix.  Blood pressure (!) 84/73, pulse 88, temperature 99 F (37.2 C), temperature source Oral, resp. rate 14, height 5\' 11"  (1.803 m), weight 98.3 kg, SpO2 97 %.
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
## 2020-02-01 NOTE — Progress Notes (Signed)
Progress Note  Patient Name: Daniel [name] Date of Encounter: 02/01/2020  [facility] Cardiologist: Larae Grooms, MD   Subjective   Had some shortness of breath this morning.  No pain.  Currently sitting up in a chair.  Inpatient Medications    Scheduled Meds: . acetaminophen  1,000 mg Oral Q6H   Or  . acetaminophen (TYLENOL) oral liquid 160 mg/5 mL  1,000 mg Per Tube Q6H  . aspirin EC  81 mg Oral Daily  . atorvastatin  80 mg Oral Daily  . bisacodyl  10 mg Oral Daily   Or  . bisacodyl  10 mg Rectal Daily  . Chlorhexidine Gluconate Cloth  6 each Topical Daily  . docusate sodium  200 mg Oral Daily  . FLUoxetine  40 mg Oral BID  . isosorbide dinitrate  10 mg Oral TID  . [START ON 02/02/2020] mouth rinse  15 mL Mouth Rinse BID  . metoCLOPramide (REGLAN) injection  10 mg Intravenous Q6H  . metoprolol tartrate  12.5 mg Oral BID   Or  . metoprolol tartrate  12.5 mg Per Tube BID  . [START ON 02/02/2020] pantoprazole  40 mg Oral Daily  . sodium chloride flush  3 mL Intravenous Q12H   Continuous Infusions: . sodium chloride Stopped (02/01/20 0833)  . sodium chloride    . sodium chloride    . albumin human Stopped (01/31/20 2232)  . cefUROXime (ZINACEF)  IV Stopped (02/01/20 0506)  . dexmedetomidine (PRECEDEX) IV infusion Stopped (02/01/20 0533)  . famotidine (PEPCID) IV Stopped (01/31/20 1502)  . insulin 0.7 mL/hr at 02/01/20 0900  . lactated ringers    . lactated ringers    . lactated ringers 20 mL/hr at 02/01/20 0900  . nitroGLYCERIN 5 mcg/min (02/01/20 0900)  . phenylephrine (NEO-SYNEPHRINE) Adult infusion 70 mcg/min (02/01/20 0900)   PRN Meds: sodium chloride, albumin human, dextrose, lactated ringers, metoprolol tartrate, morphine injection, ondansetron (ZOFRAN) IV, oxyCODONE, sodium chloride flush, traMADol   Vital Signs    Vitals:   02/01/20 0815 02/01/20 0830 02/01/20 0845 02/01/20 0900  BP: (!) 98/55 (!) 89/71 (!) 84/70 (!) 113/54  Pulse: 95 95 92 93   Resp: (!) 22 (!) 23 17 (!) 24  Temp: 99.5 F (37.5 C) 99.3 F (37.4 C)    TempSrc:      SpO2: 93% 95% 96% 94%  Weight:      Height:        Intake/Output Summary (Last 24 hours) at 02/01/2020 0913 Last data filed at 02/01/2020 0900 Gross per 24 hour  Intake 7294.7 ml  Output 3145 ml  Net 4149.7 ml   Last 3 Weights 02/01/2020 01/30/2020 01/29/2020  Weight (lbs) 216 lb 11.4 oz 204 lb 9.6 oz 203 lb 14.4 oz  Weight (kg) 98.3 kg 92.806 kg 92.488 kg      Telemetry    Sinus rhythm with PVCs - Personally Reviewed  ECG    Normal sinus rhythm 87 bpm, frequent PVCs - Personally Reviewed  Physical Exam  Awake, alert, answers all questions appropriately, no acute distress, sitting up in a bedside chair GEN: No acute distress.     Labs    High Sensitivity Troponin:  No results for input(s): TROPONINIHS in the last 720 hours.    Chemistry Recent Labs  Lab 01/30/20 1803 01/30/20 1803 01/31/20 0509 01/31/20 0819 01/31/20 1301 01/31/20 1304 01/31/20 2020 01/31/20 2101 01/31/20 2233 02/01/20 0102 02/01/20 0436  NA 140   < > 141   < >  141   < > 141   < > 143 143 140  K 4.2   < > 4.0   < > 4.1   < > 4.7   < > 4.4 4.3 4.4  CL 107   < > 106   < > 107  --  110  --   --   --  110  CO2 27   < > 25  --   --   --  22  --   --   --  23  GLUCOSE 93   < > 108*   < > 134*  --  141*  --   --   --  127*  BUN 11   < > 13   < > 12  --  13  --   --   --  13  CREATININE 0.90   < > 0.86   < > 0.70  --  1.01  --   --   --  0.95  CALCIUM 9.1   < > 9.1  --   --   --  8.3*  --   --   --  8.0*  PROT 6.1*  --   --   --   --   --   --   --   --   --   --   ALBUMIN 3.7  --   --   --   --   --   --   --   --   --   --   AST 16  --   --   --   --   --   --   --   --   --   --   ALT 14  --   --   --   --   --   --   --   --   --   --   ALKPHOS 76  --   --   --   --   --   --   --   --   --   --   BILITOT 0.7  --   --   --   --   --   --   --   --   --   --   GFRNONAA >60   < > >60  --   --   --  >60  --    --   --  >60  GFRAA >60   < > >60  --   --   --  >60  --   --   --  >60  ANIONGAP 6   < > 10  --   --   --  9  --   --   --  7   < > = values in this interval not displayed.     Hematology Recent Labs  Lab 01/31/20 1444 01/31/20 1855 01/31/20 2020 01/31/20 2101 01/31/20 2233 02/01/20 0102 02/01/20 0436  WBC 10.7*  --  13.0*  --   --   --  9.4  RBC 3.26*  --  3.04*  --   --   --  2.69*  HGB 9.7*   < > 9.2*   < > 7.8* 7.8* 7.8*  HCT 29.6*   < > 27.7*   < > 23.0* 23.0* 24.7*  MCV 90.8  --  91.1  --   --   --  91.8  MCH 29.8  --  30.3  --   --   --  29.0  MCHC 32.8  --  33.2  --   --   --  31.6  RDW 12.5  --  12.7  --   --   --  13.1  PLT 128*  --  171  --   --   --  131*   < > = values in this interval not displayed.    BNPNo results for input(s): BNP, PROBNP in the last 168 hours.   DDimer No results for input(s): DDIMER in the last 168 hours.   Radiology    DG Chest 2 View  Result Date: 01/30/2020 CLINICAL DATA:  Preoperative assessment for CABG EXAM: CHEST - 2 VIEW COMPARISON:  07/27/2014 FINDINGS: Frontal and lateral views of the chest demonstrated loop recorder within the left anterior chest. Cardiac silhouette is unremarkable. No airspace disease, effusion, or pneumothorax. Multiple prior healed left rib fractures. No acute bony abnormalities. IMPRESSION: 1. No acute intrathoracic process. Electronically Signed   By: Randa Ngo M.D.   On: 01/30/2020 19:16   CT CHEST WO CONTRAST  Result Date: 01/30/2020 CLINICAL DATA:  Aortic disease, preoperative assessment for CABG EXAM: CT CHEST WITHOUT CONTRAST TECHNIQUE: Multidetector CT imaging of the chest was performed following the standard protocol without IV contrast. COMPARISON:  None. FINDINGS: Cardiovascular: The heart is not enlarged. No pericardial effusion. There is extensive atherosclerosis of the coronary vasculature. Normal caliber of the thoracic aorta. Prominent atherosclerosis at the origin of the great vessels  and within the distal aspect of the aortic arch. No significant atherosclerosis within the ascending aorta. Mediastinum/Nodes: No enlarged mediastinal or axillary lymph nodes. Thyroid gland, trachea, and esophagus demonstrate no significant findings. Lungs/Pleura: No airspace disease, effusion, or pneumothorax. Central airways are patent. Upper Abdomen: Calcified gallstones are seen within the right upper quadrant. No cholecystitis. No other acute upper abdominal abnormalities. Musculoskeletal: There are bilateral healed rib fractures. No acute bony abnormalities. Reconstructed images demonstrate no additional findings. IMPRESSION: 1. No acute intrathoracic process. 2. Extensive atherosclerosis of the coronary vasculature. Mild atherosclerosis at the origin of the great vessels and descending thoracic aorta. (ICD10-I70.0). 3. Cholelithiasis without cholecystitis. Electronically Signed   By: Randa Ngo M.D.   On: 01/30/2020 17:13   DG Chest Port 1 View  Result Date: 02/01/2020 CLINICAL DATA:  Chest tube. EXAM: PORTABLE CHEST 1 VIEW COMPARISON:  01/31/2011. FINDINGS: Interim extubation removal of NG tube. Swan-Ganz catheter mediastinal drainage catheter, bilateral chest tubes in stable position. Cardiac monitor device noted. Prior median sternotomy. Stable cardiomegaly. Low lung volumes with bibasilar atelectasis. Bibasilar infiltrates cannot be excluded. Small left pleural effusion cannot be excluded. Carotid vascular calcification. Degenerative change thoracic spine. IMPRESSION: 1. Interim extubation removal of NG tube. Remaining lines and tubes including bilateral chest tubes in stable position. No pneumothorax. 2. Cardiac monitor device noted prior median sternotomy. Stable cardiomegaly. 3. Low lung volumes with persistent bibasilar atelectasis/infiltrates. Small left pleural effusion cannot be excluded. 4.  Carotid vascular disease. Electronically Signed   By: Marcello Moores  Register   On: 02/01/2020 08:09    DG Chest Port 1 View  Result Date: 01/31/2020 CLINICAL DATA:  Status post CABG. EXAM: PORTABLE CHEST 1 VIEW COMPARISON:  Preoperative radiograph yesterday. FINDINGS: Interval median sternotomy and CABG. Endotracheal tube tip 2.9 cm from the carina. Enteric tube in place with tip below the diaphragm not included in the field of view. Right internal jugular central venous catheter tip in the region  of the main pulmonary outflow tract. Mediastinal drain and bilateral chest tubes in place. Implanted loop recorder projects over the left chest wall. Low lung volumes. Normal mediastinal contours for technique. Heart is normal in size. No pulmonary edema or visualized pneumothorax. Minimal retrocardiac atelectasis and trace left pleural effusion. There are remote left rib fractures. IMPRESSION: 1. Interval median sternotomy and CABG. Support apparatus as described. 2. Low lung volumes with minimal retrocardiac atelectasis and trace left pleural effusion. Electronically Signed   By: Keith Rake M.D.   On: 01/31/2020 15:33   ECHO INTRAOPERATIVE TEE  Result Date: 01/31/2020  *INTRAOPERATIVE TRANSESOPHAGEAL REPORT *  Patient Name:   TREVIOUS RAMPEY  Date of Exam: 01/31/2020 Medical Rec #:  [id number]      Height:       71.0 in Accession #:    [id number]     Weight:       204.6 lb Date of Birth:  22-Nov-1950      BSA:          2.13 m Patient Age:    33 years       BP:           142/74 mmHg Patient Gender: M              HR:           60 bpm. Exam Location:  Anesthesiology Transesophogeal exam was perform intraoperatively during surgical procedure. Patient was closely monitored under general anesthesia during the entirety of examination. Indications:     CAD Native Vessel i25.0 Sonographer:     Raquel Sarna Senior RDCS Performing Phys: [id number] Ahmed Prima Z ATKINS Diagnosing Phys: [name] MD Complications: No known complications during this procedure. POST-OP IMPRESSIONS - Left Ventricle: The left ventricle is unchanged  from pre-bypass. - Aorta: The aorta appears unchanged from pre-bypass. - Left Atrial Appendage: The left atrial appendage appears unchanged from pre-bypass. - Aortic Valve: The aortic valve appears unchanged from pre-bypass. - Mitral Valve: Essentially unchanged. Mild MR that continued to improve post bypass. - Tricuspid Valve: The tricuspid valve appears unchanged from pre-bypass. PRE-OP FINDINGS  Left Ventricle: The left ventricle has normal systolic function, with an ejection fraction of 55-60%. The cavity size was normal. There is concentric left ventricular hypertrophy. Right Ventricle: The right ventricle has normal systolic function. The cavity was normal. There is no increase in right ventricular wall thickness. Left Atrium: Left atrial size was dilated. The left atrial appendage is well visualized and there is no evidence of thrombus present. Right Atrium: Right atrial size was normal in size. Interatrial Septum: No atrial level shunt detected by color flow Doppler. Pericardium: There is no evidence of pericardial effusion. Mitral Valve: The mitral valve is normal in structure. Mild thickening of the mitral valve leaflet. Mitral valve regurgitation is trivial by color flow Doppler. There is no evidence of mitral valve vegetation. Tricuspid Valve: The tricuspid valve was normal in structure. Tricuspid valve regurgitation is trivial by color flow Doppler. Aortic Valve: The aortic valve is tricuspid Aortic valve regurgitation was not visualized by color flow Doppler. There is no evidence of aortic valve stenosis. There is no evidence of a vegetation on the aortic valve. Pulmonic Valve: The pulmonic valve was normal in structure. Pulmonic valve regurgitation is trivial by color flow Doppler. Aorta: There is evidence of atheroma immobile plaque in the descending aorta; Grade III, measuring 3-69mm in size. +-------------+-----------++ AORTIC VALVE             +-------------+-----------++  AV Vmax:     147.00  cm/s +-------------+-----------++ AV Vmean:    95.800 cm/s +-------------+-----------++ AV VTI:      0.399 m     +-------------+-----------++ AV Peak Grad:8.6 mmHg    +-------------+-----------++ AV Mean Grad:4.0 mmHg    +-------------+-----------++  [name] MD Electronically signed by [name] MD Signature Date/Time: 01/31/2020/5:55:16 PM    Final      Patient Profile     69 y.o. male presenting with unstable angina found to have multivessel coronary artery disease undergoes multivessel CABG 01/31/2020  Assessment & Plan    1.  CAD status post CABG: Progressing well in the early postop period.  He will be mobilized today per Dr. Orvan Seen.  Treated with aspirin, atorvastatin, Isordil, and low-dose metoprolol. 2.  Hypertension: Postoperative day 1, weaning off of pressors currently just on phenylephrine 3.  Hyperlipidemia: Treated with atorvastatin 80 mg daily     For questions or updates, please contact [place] Please consult [email or web address] for contact info under        Signed, [name], MD  02/01/2020, 9:13 AM

## 2020-02-01 NOTE — Progress Notes (Signed)
1 Day Post-Op Procedure(s) (LRB): CORONARY ARTERY BYPASS GRAFTING (CABG) TIMES FIVE USING BOTH INTERNAL MAMMARY ARTERIES AND LEFT RADIAL ARTERY. RIGHT CORONARY ENDARTERECTOMY (N/A) RADIAL ARTERY HARVEST (Left) TRANSESOPHAGEAL ECHOCARDIOGRAM (TEE) (N/A) INDOCYANINE GREEN FLUORESCENCE IMAGING (ICG) (N/A) Subjective: No complaints  Objective: Vital signs in last 24 hours: Temp:  [97.3 F (36.3 C)-100.9 F (38.3 C)] 99.5 F (37.5 C) (09/24 0700) Pulse Rate:  [34-140] 94 (09/24 0700) Cardiac Rhythm: Atrial paced (09/23 2000) Resp:  [12-29] 16 (09/24 0700) BP: (84-129)/(56-68) 91/56 (09/24 0700) SpO2:  [89 %-100 %] 89 % (09/24 0700) Arterial Line BP: (64-135)/(45-73) 64/57 (09/24 0700) FiO2 (%):  [40 %-50 %] 40 % (09/23 2133) Weight:  [98.3 kg] 98.3 kg (09/24 0500)  Hemodynamic parameters for last 24 hours: PAP: (24-60)/(7-40) 34/15 CO:  [3.7 L/min-7.7 L/min] 7.7 L/min CI:  [1.8 L/min/m2-3.6 L/min/m2] 3.6 L/min/m2  Intake/Output from previous day: 09/23 0701 - 09/24 0700 In: 7521 [P.O.:300; I.V.:3857.5; DXIPJ:825; IV Piggyback:1789.5] Out: 0539 [Urine:1855; Blood:645; Chest Tube:550] Intake/Output this shift: No intake/output data recorded.  General appearance: alert and cooperative Neurologic: intact Heart: regular rate and rhythm, S1, S2 normal, no murmur, click, rub or gallop Lungs: clear to auscultation bilaterally Abdomen: soft, non-tender; bowel sounds normal; no masses,  no organomegaly Extremities: extremities normal, atraumatic, no cyanosis or edema Wound: dressed  Lab Results: Recent Labs    01/31/20 2020 01/31/20 2101 02/01/20 0102 02/01/20 0436  WBC 13.0*  --   --  9.4  HGB 9.2*   < > 7.8* 7.8*  HCT 27.7*   < > 23.0* 24.7*  PLT 171  --   --  131*   < > = values in this interval not displayed.   BMET:  Recent Labs    01/31/20 2020 01/31/20 2101 02/01/20 0102 02/01/20 0436  NA 141   < > 143 140  K 4.7   < > 4.3 4.4  CL 110  --   --  110  CO2 22   --   --  23  GLUCOSE 141*  --   --  127*  BUN 13  --   --  13  CREATININE 1.01  --   --  0.95  CALCIUM 8.3*  --   --  8.0*   < > = values in this interval not displayed.    PT/INR:  Recent Labs    01/31/20 1444  LABPROT 16.1*  INR 1.4*   ABG    Component Value Date/Time   PHART 7.279 (L) 02/01/2020 0102   HCO3 21.9 02/01/2020 0102   TCO2 23 02/01/2020 0102   ACIDBASEDEF 4.0 (H) 02/01/2020 0102   O2SAT 91.0 02/01/2020 0102   CBG (last 3)  Recent Labs    02/01/20 0441 02/01/20 0625 02/01/20 0723  GLUCAP 133* 129* 122*    Assessment/Plan: S/P Procedure(s) (LRB): CORONARY ARTERY BYPASS GRAFTING (CABG) TIMES FIVE USING BOTH INTERNAL MAMMARY ARTERIES AND LEFT RADIAL ARTERY. RIGHT CORONARY ENDARTERECTOMY (N/A) RADIAL ARTERY HARVEST (Left) TRANSESOPHAGEAL ECHOCARDIOGRAM (TEE) (N/A) INDOCYANINE GREEN FLUORESCENCE IMAGING (ICG) (N/A) Mobilize d/c PA catheter  Out of bed    LOS: 4 days    [name] 02/01/2020

## 2020-02-01 NOTE — Plan of Care (Signed)

## 2020-02-02 ENCOUNTER — Inpatient Hospital Stay: Payer: Self-pay

## 2020-02-02 ENCOUNTER — Inpatient Hospital Stay (HOSPITAL_COMMUNITY): Payer: No Typology Code available for payment source

## 2020-02-02 LAB — BASIC METABOLIC PANEL
Anion gap: 8 (ref 5–15)
BUN: 21 mg/dL (ref 8–23)
CO2: 25 mmol/L (ref 22–32)
Calcium: 8.2 mg/dL — ABNORMAL LOW (ref 8.9–10.3)
Chloride: 104 mmol/L (ref 98–111)
Creatinine, Ser: 1.14 mg/dL (ref 0.61–1.24)
GFR calc Af Amer: >60 mL/min (ref >60)
GFR calc non Af Amer: >60 mL/min (ref >60)
Glucose, Bld: 105 mg/dL — ABNORMAL HIGH (ref 70–99)
Potassium: 4.4 mmol/L (ref 3.5–5.1)
Sodium: 137 mmol/L (ref 135–145)

## 2020-02-02 LAB — TYPE AND SCREEN
ABO/RH(D): O POS
Antibody Screen: NEGATIVE
Unit division: 0
Unit division: 0

## 2020-02-02 LAB — CBC
HCT: 26.9 % — ABNORMAL LOW (ref 39.0–52.0)
Hemoglobin: 8.4 g/dL — ABNORMAL LOW (ref 13.0–17.0)
MCH: 28.3 pg (ref 26.0–34.0)
MCHC: 31.2 g/dL (ref 30.0–36.0)
MCV: 90.6 fL (ref 80.0–100.0)
Platelets: 97 10*3/uL — ABNORMAL LOW (ref 150–400)
RBC: 2.97 MIL/uL — ABNORMAL LOW (ref 4.22–5.81)
RDW: 15.9 % — ABNORMAL HIGH (ref 11.5–15.5)
WBC: 10.3 10*3/uL (ref 4.0–10.5)
nRBC: 0 % (ref 0.0–0.2)

## 2020-02-02 LAB — GLUCOSE, CAPILLARY
Glucose-Capillary: 104 mg/dL — ABNORMAL HIGH (ref 70–99)
Glucose-Capillary: 107 mg/dL — ABNORMAL HIGH (ref 70–99)
Glucose-Capillary: 68 mg/dL — ABNORMAL LOW (ref 70–99)
Glucose-Capillary: 83 mg/dL (ref 70–99)
Glucose-Capillary: 84 mg/dL (ref 70–99)
Glucose-Capillary: 85 mg/dL (ref 70–99)
Glucose-Capillary: 90 mg/dL (ref 70–99)
Glucose-Capillary: 99 mg/dL (ref 70–99)

## 2020-02-02 LAB — BPAM RBC
Blood Product Expiration Date: 202110252359
Blood Product Expiration Date: 202110252359
ISSUE DATE / TIME: 202109241822
ISSUE DATE / TIME: 202109242043
Unit Type and Rh: 5100
Unit Type and Rh: 5100

## 2020-02-02 MED ORDER — INSULIN ASPART 100 UNIT/ML ~~LOC~~ SOLN
0.0000 [IU] | Freq: Three times a day (TID) | SUBCUTANEOUS | Status: DC
  Administered 2020-02-04 (×3): 2 [IU] via SUBCUTANEOUS
  Administered 2020-02-05: 8 [IU] via SUBCUTANEOUS

## 2020-02-02 MED ORDER — HALOPERIDOL LACTATE 5 MG/ML IJ SOLN: 2.0000 mg | INTRAMUSCULAR | Status: DC | PRN

## 2020-02-02 MED ORDER — SODIUM CHLORIDE 0.9% FLUSH: 10.0000 mL | INTRAVENOUS | Status: DC | PRN

## 2020-02-02 MED ORDER — HALOPERIDOL LACTATE 5 MG/ML IJ SOLN: 1.0000 mg | Freq: Once | INTRAMUSCULAR | Status: AC

## 2020-02-02 MED ORDER — HALOPERIDOL LACTATE 5 MG/ML IJ SOLN
INTRAMUSCULAR | Status: AC
  Filled 2020-02-02: qty 1

## 2020-02-02 MED ORDER — ALBUMIN HUMAN 5 % IV SOLN: 12.5000 g | Freq: Once | INTRAVENOUS | Status: AC

## 2020-02-02 MED ORDER — NOREPINEPHRINE 4 MG/250ML-% IV SOLN: 0.0000 ug/min | INTRAVENOUS | Status: DC

## 2020-02-02 MED ORDER — HALOPERIDOL LACTATE 5 MG/ML IJ SOLN
5.0000 mg | Freq: Four times a day (QID) | INTRAMUSCULAR | Status: DC | PRN
  Administered 2020-02-02 (×2): 5 mg via INTRAVENOUS
  Filled 2020-02-02 (×2): qty 1

## 2020-02-02 MED ORDER — SODIUM CHLORIDE 0.9% FLUSH: 10.0000 mL | Freq: Two times a day (BID) | INTRAVENOUS | Status: DC

## 2020-02-02 MED ORDER — DEXMEDETOMIDINE HCL IN NACL 400 MCG/100ML IV SOLN
0.4000 ug/kg/h | INTRAVENOUS | Status: DC
  Administered 2020-02-02: 0.3 ug/kg/h via INTRAVENOUS
  Administered 2020-02-02: 1 ug/kg/h via INTRAVENOUS
  Filled 2020-02-02: qty 100

## 2020-02-02 MED ORDER — ALBUMIN HUMAN 25 % IV SOLN
12.5000 g | Freq: Once | INTRAVENOUS | Status: AC
  Administered 2020-02-02: 12.5 g via INTRAVENOUS
  Filled 2020-02-02: qty 50

## 2020-02-02 MED ORDER — ALBUMIN HUMAN 5 % IV SOLN
INTRAVENOUS | Status: AC
  Administered 2020-02-02: 12.5 g via INTRAVENOUS
  Filled 2020-02-02: qty 250

## 2020-02-02 MED ORDER — LACTATED RINGERS IV SOLN: INTRAVENOUS | Status: DC

## 2020-02-02 MED ORDER — DEXMEDETOMIDINE HCL IN NACL 400 MCG/100ML IV SOLN
0.3000 ug/kg/h | INTRAVENOUS | Status: DC
  Administered 2020-02-03: 0.3 ug/kg/h via INTRAVENOUS
  Filled 2020-02-02: qty 100

## 2020-02-02 MED ORDER — HALOPERIDOL LACTATE 5 MG/ML IJ SOLN: 5.0000 mg | INTRAMUSCULAR | Status: DC | PRN

## 2020-02-02 MED ORDER — FENTANYL CITRATE (PF) 100 MCG/2ML IJ SOLN
50.0000 ug | INTRAMUSCULAR | Status: DC | PRN
  Administered 2020-02-04: 50 ug via INTRAVENOUS
  Filled 2020-02-02: qty 2

## 2020-02-02 MED ORDER — HALOPERIDOL LACTATE 5 MG/ML IJ SOLN
INTRAMUSCULAR | Status: AC
  Administered 2020-02-02: 1 mg via INTRAVENOUS
  Filled 2020-02-02: qty 1

## 2020-02-02 MED ORDER — THIAMINE HCL 100 MG/ML IJ SOLN
100.0000 mg | Freq: Every day | INTRAMUSCULAR | Status: DC
  Administered 2020-02-02 – 2020-02-04 (×3): 100 mg via INTRAVENOUS
  Filled 2020-02-02 (×3): qty 2

## 2020-02-02 NOTE — Progress Notes (Signed)
Spoke with Levada Dy RN re PICC order. Levada Dy RN In the room with the pt. Pt loud and argumentative in background.  Notified unable to place PICC with pt unable to maintain sterility d/t agitation and hypotensive.
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
## 2020-02-02 NOTE — Op Note (Signed)
CARDIOTHORACIC SURGERY OPERATIVE NOTE  Date of Procedure: 01/31/20 Preoperative Diagnosis: Severe 3-vessel Coronary Artery Disease  Postoperative Diagnosis: Same  Procedure:    Coronary Artery Bypass Grafting x 5  Left Internal Mammary Artery to Distal Left Anterior Descending Coronary Artery; pedicled right internal mammary artery to distal right coronary Artery with right coronary artery endarterectomy; left radial artery graft to first and second obtuse Marginal Branches of Left Circumflex Coronary Artery and ramus intermedius vessel as a sequenced graft Bilateral internal mammary artery harvesting Open left radial artery harvesting Completion graft surveillance with indocyanine green fluorescence imaging  Surgeon: B. Murvin Natal, MD  Assistant: Leretha Pol, PA-C  Anesthesia: General  Operative Findings:  Preserved left ventricular systolic function  Good quality internal mammary artery conduits  Good quality radial artery conduit  Good quality target vessels for grafting, except for right coronary artery    BRIEF CLINICAL NOTE AND INDICATIONS FOR SURGERY  69 year old gentleman presented with progressive angina and is strong history of PCI the most recently in April 2021. Repeat catheterization demonstrated severe multivessel disease. He is referred for surgery. He has been thoroughly evaluated and is considered a good candidate   DETAILS OF THE OPERATIVE PROCEDURE  Preparation:  The patient is brought to the operating room on the above mentioned date and central monitoring was established by the anesthesia team including placement of Swan-Ganz catheter and radial arterial line. The patient is placed in the supine position on the operating table.  Intravenous antibiotics are administered. General endotracheal anesthesia is induced uneventfully. A Foley catheter is placed.  Baseline transesophageal echocardiogram was performed.  Findings were notable for preserved LV  function  The patient's chest, abdomen, left upper extremity, both groins, and both lower extremities are prepared and draped in a sterile manner. A time out procedure is performed.   Surgical Approach and Conduit Harvest:  A median sternotomy incision was performed and the left internal mammary artery is dissected from the chest wall and prepared for bypass grafting. The left internal mammary artery is notably good quality conduit. Simultaneously, open left radial artery harvesting is performed. After completing the radial artery harvesting, the forearm incision is repaired in layers and the arm is tucked at the side. Next the right internal mammary artery is harvested in a standard fashion. Following systemic heparinization, the internal mammary artery grafts were transected distally noted to have excellent flow. They were treated with a solution of papaverine   Extracorporeal Cardiopulmonary Bypass and Myocardial Protection:  The pericardium is opened. The ascending aorta is nondiseased in appearance. The ascending aorta and the right atrium are cannulated for cardiopulmonary bypass.  Adequate heparinization is verified.     Cardiopulmonary bypass was begun and the surface of the heart is inspected. Distal target vessels are selected for coronary artery bypass grafting. A cardioplegia cannula is placed in the ascending aorta.  The patient is allowed to cool passively to 34C systemic temperature.  The aortic cross clamp is applied and cold blood cardioplegia is delivered initially in an antegrade fashion through the aortic root.   Iced saline slush is applied for topical hypothermia.  The initial cardioplegic arrest is rapid with early diastolic arrest.  Repeat doses of cardioplegia are administered intermittently throughout the entire cross clamp portion of the operation through the aortic root and through subsequently placed vein grafts in order to maintain completely flat  electrocardiogram.  Coronary Artery Bypass Grafting:   The distal right coronary artery required endarterectomy due to diffuse calcific disease, small size  and minimal lumen in which to create a stenosis . The RCA was grafted using the pedicled right internal mammary artery graft in an end-to-side fashion.  At the site of distal anastomosis the target vessel was poor quality and measured approximately 1.5 mm in diameter. Anastomotic patency and runoff was confirmed with indocyanine green fluorescence imaging (SPY).  The first and second obtuse marginal branches of the left circumflex coronary artery and the ramus intermedius vessel were grafted using the left radial artery graft in a sequenced fashion.  At the site of distal anastomoses the target vessel were good quality and measured approximately 1.5 mm in diameter. Anastomotic patency and runoff was confirmed with indocyanine green fluorescence imaging (SPY).  The distal left anterior coronary artery was grafted with the left internal mammary artery in an end-to-side fashion.  At the site of distal anastomosis the target vessel was good quality and measured approximately 1.5 mm in diameter. Anastomotic patency and runoff was confirmed with indocyanine green fluorescence imaging (SPY).  The proximal radial graft anastomosis were placed directly to the ascending aorta prior to removal of the aortic cross clamp. A hotshot dose of cardioplegia was given antegrade followed by de-airing procedures and the aortic cross-clamp was removed   Procedure Completion:  All proximal and distal coronary anastomoses were inspected for hemostasis and appropriate graft orientation. Epicardial pacing wires are fixed to the right ventricular outflow tract and to the right atrial appendage. The patient is rewarmed to 37C temperature. The patient is weaned and disconnected from cardiopulmonary bypass.  The patient's rhythm at separation from bypass was sinus  bradycardia.  The patient was weaned from cardiopulmonary bypass  without any inotropic support.  Followup transesophageal echocardiogram performed after separation from bypass revealed  no changes from the preoperative exam.  The aortic and venous cannula were removed uneventfully. Protamine was administered to reverse the anticoagulation. The mediastinum and pleural space were inspected for hemostasis and irrigated with saline solution. The mediastinum and bilateral pleural spaces were drained using fluted chest tubes placed through separate stab incisions inferiorly.  The soft tissues anterior to the aorta were reapproximated loosely. The sternum is closed with double strength sternal wire. The soft tissues anterior to the sternum were closed in multiple layers and the skin is closed with a running subcuticular skin closure.  The post-bypass portion of the operation was notable for stable rhythm and hemodynamics.    Disposition:  The patient tolerated the procedure well and is transported to the surgical intensive care in stable condition. There are no intraoperative complications. All sponge instrument and needle counts are verified correct at completion of the operation.    Jayme Cloud, MD 02/02/2020 7:18 PM

## 2020-02-02 NOTE — Progress Notes (Signed)
eLink Physician-Brief Progress Note Patient Name: COSBY PROBY DOB: 07/28/1950 MRN: [id number]   Date of Service  02/02/2020  HPI/Events of Note  Patient with agitated delirium.  eICU Interventions  Haldol 2 mg iv x 1, Precedex infusion started.        Kerry Kass Abednego Yeates 02/02/2020, 5:07 AM

## 2020-02-02 NOTE — Progress Notes (Signed)
Pt alert & oriented x 4, following commands at start of shift and with initial assessment. Following administration of Morphine for pain, pt became disoriented, restless, and started visually hallucinating. Pt's mentation waxed and waned over most of shift. Early this morning pt became extremely agitated and delirious requiring Haldol x 1 and Precedex gtt to be started. Pt made multiple attempts to get out of bed without assistance, continued to remove oxygen, monitoring devices, etc without regards to safety. MD notified, orders received and carried out. Wife called and updated on current status, all questions answered. Will monitor.

## 2020-02-02 NOTE — Progress Notes (Signed)
Attempted to call wife for PICC consent, no answer.

## 2020-02-02 NOTE — Progress Notes (Signed)
CT surgery p.m. Rounds  Postop delirium improved with dosing of as needed Haldol and low-dose Precedex Remains with arm restraints We will place PICC line for poor IV access and the need for IV medication Sinus rhythm blood, pressure supported with Neo-Synephrine and gentle volume administration

## 2020-02-02 NOTE — Progress Notes (Signed)
Notified Dr Prescott Gum of overnight events including delirium and agitation. Patient currently on 0.6 mcg/kg/hr of precedex and still agitated. Requiring phenylephrine for hypotension from precedex. MD ordered to restrain patient and decrease precedex drip to 0.3 mcg/kg/hr.

## 2020-02-02 NOTE — Progress Notes (Signed)
Wife returned call, PICC consent obtained.

## 2020-02-02 NOTE — Plan of Care (Signed)
  Problem: Clinical Measurements: Goal: Ability to maintain clinical measurements within normal limits will improve Outcome: Progressing Goal: Will remain free from infection Outcome: Progressing Goal: Diagnostic test results will improve Outcome: Progressing Goal: Respiratory complications will improve Outcome: Progressing Goal: Cardiovascular complication will be avoided Outcome: Progressing   Problem: Activity: Goal: Risk for activity intolerance will decrease Outcome: Progressing   Problem: Nutrition: Goal: Adequate nutrition will be maintained Outcome: Progressing   Problem: Coping: Goal: Level of anxiety will decrease Outcome: Progressing   Problem: Elimination: Goal: Will not experience complications related to bowel motility Outcome: Progressing Goal: Will not experience complications related to urinary retention Outcome: Progressing   Problem: Pain Managment: Goal: General experience of comfort will improve Outcome: Progressing   Problem: Skin Integrity: Goal: Risk for impaired skin integrity will decrease Outcome: Progressing   Problem: Education: Goal: Individualized Educational Video(s) Outcome: Progressing   Problem: Activity: Goal: Risk for activity intolerance will decrease Outcome: Progressing   Problem: Respiratory: Goal: Respiratory status will improve Outcome: Progressing   Problem: Skin Integrity: Goal: Wound healing without signs and symptoms of infection Outcome: Progressing Goal: Risk for impaired skin integrity will decrease Outcome: Progressing   Problem: Urinary Elimination: Goal: Ability to achieve and maintain adequate renal perfusion and functioning will improve Outcome: Progressing

## 2020-02-03 ENCOUNTER — Inpatient Hospital Stay (HOSPITAL_COMMUNITY): Payer: No Typology Code available for payment source

## 2020-02-03 LAB — CBC
HCT: 27 % — ABNORMAL LOW (ref 39.0–52.0)
HCT: 26.6 % — ABNORMAL LOW (ref 39.0–52.0)
Hemoglobin: 8.5 g/dL — ABNORMAL LOW (ref 13.0–17.0)
Hemoglobin: 8.3 g/dL — ABNORMAL LOW (ref 13.0–17.0)
MCH: 28.2 pg (ref 26.0–34.0)
MCH: 27.9 pg (ref 26.0–34.0)
MCHC: 31.5 g/dL (ref 30.0–36.0)
MCHC: 31.2 g/dL (ref 30.0–36.0)
MCV: 89.7 fL (ref 80.0–100.0)
MCV: 89.6 fL (ref 80.0–100.0)
Platelets: 103 10*3/uL — ABNORMAL LOW (ref 150–400)
Platelets: 120 10*3/uL — ABNORMAL LOW (ref 150–400)
RBC: 3.01 MIL/uL — ABNORMAL LOW (ref 4.22–5.81)
RBC: 2.97 MIL/uL — ABNORMAL LOW (ref 4.22–5.81)
RDW: 15.6 % — ABNORMAL HIGH (ref 11.5–15.5)
RDW: 15.5 % (ref 11.5–15.5)
WBC: 8.5 10*3/uL (ref 4.0–10.5)
WBC: 7.8 10*3/uL (ref 4.0–10.5)
nRBC: 0 % (ref 0.0–0.2)
nRBC: 0 % (ref 0.0–0.2)

## 2020-02-03 LAB — COMPREHENSIVE METABOLIC PANEL
ALT: 25 U/L (ref 0–44)
AST: 45 U/L — ABNORMAL HIGH (ref 15–41)
Albumin: 3.2 g/dL — ABNORMAL LOW (ref 3.5–5.0)
Alkaline Phosphatase: 65 U/L (ref 38–126)
Anion gap: 7 (ref 5–15)
BUN: 27 mg/dL — ABNORMAL HIGH (ref 8–23)
CO2: 23 mmol/L (ref 22–32)
Calcium: 8.2 mg/dL — ABNORMAL LOW (ref 8.9–10.3)
Chloride: 106 mmol/L (ref 98–111)
Creatinine, Ser: 1.08 mg/dL (ref 0.61–1.24)
GFR calc Af Amer: >60 mL/min (ref >60)
GFR calc non Af Amer: >60 mL/min (ref >60)
Glucose, Bld: 97 mg/dL (ref 70–99)
Potassium: 4.5 mmol/L (ref 3.5–5.1)
Sodium: 136 mmol/L (ref 135–145)
Total Bilirubin: 0.5 mg/dL (ref 0.3–1.2)
Total Protein: 5 g/dL — ABNORMAL LOW (ref 6.5–8.1)

## 2020-02-03 LAB — BASIC METABOLIC PANEL
Anion gap: 12 (ref 5–15)
BUN: 19 mg/dL (ref 8–23)
CO2: 22 mmol/L (ref 22–32)
Calcium: 8.2 mg/dL — ABNORMAL LOW (ref 8.9–10.3)
Chloride: 102 mmol/L (ref 98–111)
Creatinine, Ser: 1.01 mg/dL (ref 0.61–1.24)
GFR calc Af Amer: >60 mL/min (ref >60)
GFR calc non Af Amer: >60 mL/min (ref >60)
Glucose, Bld: 153 mg/dL — ABNORMAL HIGH (ref 70–99)
Potassium: 3.7 mmol/L (ref 3.5–5.1)
Sodium: 136 mmol/L (ref 135–145)

## 2020-02-03 LAB — GLUCOSE, CAPILLARY
Glucose-Capillary: 77 mg/dL (ref 70–99)
Glucose-Capillary: 109 mg/dL — ABNORMAL HIGH (ref 70–99)
Glucose-Capillary: 105 mg/dL — ABNORMAL HIGH (ref 70–99)
Glucose-Capillary: 100 mg/dL — ABNORMAL HIGH (ref 70–99)
Glucose-Capillary: 130 mg/dL — ABNORMAL HIGH (ref 70–99)

## 2020-02-03 MED ORDER — FUROSEMIDE 10 MG/ML IJ SOLN
40.0000 mg | Freq: Two times a day (BID) | INTRAMUSCULAR | Status: DC
  Administered 2020-02-03 – 2020-02-04 (×4): 40 mg via INTRAVENOUS
  Filled 2020-02-03 (×4): qty 4

## 2020-02-03 MED ORDER — ALBUMIN HUMAN 25 % IV SOLN
25.0000 g | Freq: Four times a day (QID) | INTRAVENOUS | Status: AC
  Administered 2020-02-03: 12.5 g via INTRAVENOUS
  Administered 2020-02-03: 25 g via INTRAVENOUS
  Administered 2020-02-03: 12.5 g via INTRAVENOUS
  Administered 2020-02-04: 25 g via INTRAVENOUS
  Filled 2020-02-03 (×4): qty 100

## 2020-02-03 MED ORDER — METHYLPREDNISOLONE SODIUM SUCC 125 MG IJ SOLR
80.0000 mg | Freq: Two times a day (BID) | INTRAMUSCULAR | Status: AC
  Administered 2020-02-03 – 2020-02-04 (×4): 80 mg via INTRAVENOUS
  Filled 2020-02-03 (×4): qty 2

## 2020-02-03 MED ORDER — LABETALOL HCL 5 MG/ML IV SOLN
10.0000 mg | INTRAVENOUS | Status: DC | PRN
  Administered 2020-02-03 – 2020-02-04 (×2): 10 mg via INTRAVENOUS
  Filled 2020-02-03 (×2): qty 4

## 2020-02-03 MED ORDER — METOPROLOL TARTRATE 25 MG PO TABS
25.0000 mg | ORAL_TABLET | Freq: Two times a day (BID) | ORAL | Status: DC
  Administered 2020-02-03 – 2020-02-06 (×6): 25 mg via ORAL
  Filled 2020-02-03 (×6): qty 1

## 2020-02-03 MED ORDER — HALOPERIDOL LACTATE 5 MG/ML IJ SOLN: 2.0000 mg | Freq: Four times a day (QID) | INTRAMUSCULAR | Status: DC | PRN

## 2020-02-03 MED ORDER — SODIUM CHLORIDE 0.9% FLUSH: 10.0000 mL | INTRAVENOUS | Status: DC | PRN

## 2020-02-03 MED ORDER — SODIUM CHLORIDE 0.9% FLUSH
10.0000 mL | Freq: Two times a day (BID) | INTRAVENOUS | Status: DC
  Administered 2020-02-03 – 2020-02-06 (×6): 10 mL

## 2020-02-03 MED ORDER — BUDESONIDE 0.5 MG/2ML IN SUSP
0.5000 mg | Freq: Two times a day (BID) | RESPIRATORY_TRACT | Status: DC
  Administered 2020-02-03 – 2020-02-06 (×7): 0.5 mg via RESPIRATORY_TRACT
  Filled 2020-02-03 (×7): qty 2

## 2020-02-03 NOTE — Progress Notes (Signed)
CT Surgery  OOB Wheezes resolved nsr Good urine output DC chest tubes tomorrow  patient examined and medical record reviewed,agree with above note. Tharon Aquas Trigt III 02/03/2020

## 2020-02-03 NOTE — Plan of Care (Signed)

## 2020-02-03 NOTE — Progress Notes (Signed)
Peripherally Inserted Central Catheter Placement  The IV Nurse has discussed with the patient and/or persons authorized to consent for the patient, the purpose of this procedure and the potential benefits and risks involved with this procedure.  The benefits include less needle sticks, lab draws from the catheter, and the patient may be discharged home with the catheter. Risks include, but not limited to, infection, bleeding, blood clot (thrombus formation), and puncture of an artery; nerve damage and irregular heartbeat and possibility to perform a PICC exchange if needed/ordered by physician.  Alternatives to this procedure were also discussed.  Bard Power PICC patient education guide, fact sheet on infection prevention and patient information card has been provided to patient /or left at bedside. Telephone consent obtained from wife 02/02/20, pt also agreeable to PICC placement.   PICC Placement Documentation  PICC Double Lumen 68/08/81 PICC Right Basilic 42 cm 3 cm (Active)  Indication for Insertion or Continuance of Line Vasoactive infusions;Administration of hyperosmolar/irritating solutions (i.e. TPN, Vancomycin, etc.) 02/03/20 1157  Exposed Catheter (cm) 3 cm 02/03/20 1157  Site Assessment Clean;Dry;Intact 02/03/20 1157  Lumen #1 Status Flushed;Saline locked;Blood return noted 02/03/20 1157  Lumen #2 Status Flushed;Saline locked;Blood return noted 02/03/20 1157  Dressing Type Transparent 02/03/20 1157  Dressing Status Clean;Dry;Intact 02/03/20 1157  Antimicrobial disc in place? Yes 02/03/20 1157  Safety Lock Not Applicable 03/09/58 4585  Line Care Connections checked and tightened 02/03/20 1157  Line Adjustment (NICU/IV Team Only) No 02/03/20 1157  Dressing Intervention New dressing 02/03/20 1157  Dressing Change Due 02/10/20 02/03/20 1157       Rolena Infante 02/03/2020, 11:58 AM

## 2020-02-03 NOTE — Progress Notes (Signed)
3 Days Post-Op Procedure(s) (LRB): CORONARY ARTERY BYPASS GRAFTING (CABG) TIMES FIVE USING BOTH INTERNAL MAMMARY ARTERIES AND LEFT RADIAL ARTERY. RIGHT CORONARY ENDARTERECTOMY (N/A) RADIAL ARTERY HARVEST (Left) TRANSESOPHAGEAL ECHOCARDIOGRAM (TEE) (N/A) INDOCYANINE GREEN FLUORESCENCE IMAGING (ICG) (N/A) Subjective: Wheezing, soft BP on neo, increased O2 requirement Delerium much better- neuro intact NSR CXR wet Objective: Vital signs in last 24 hours: Temp:  [97.5 F (36.4 C)-99.3 F (37.4 C)] 98 F (36.7 C) (09/26 0700) Pulse Rate:  [44-121] 88 (09/26 1045) Cardiac Rhythm: A-V Sequential paced (09/26 0800) Resp:  [13-29] 16 (09/26 1045) BP: (61-191)/(43-99) 154/77 (09/26 1045) SpO2:  [84 %-100 %] 100 % (09/26 1045) Weight:  [99.6 kg] 99.6 kg (09/26 0500)  Hemostable Intake/Output from previous day: 09/25 0701 - 09/26 0700 In: 4880.9 [P.O.:1840; I.V.:2760.4; IV Piggyback:280.5] Out: 1515 [Urine:745; Chest Tube:770] Intake/Output this shift: Total I/O In: 343.1 [P.O.:120; I.V.:173.1; IV Piggyback:50] Out: 7793 [Urine:1570]  Coarse BS w/ wheezes extrem warm abd soft  Lab Results: Recent Labs    02/02/20 0454 02/03/20 0208  WBC 10.3 8.5  HGB 8.4* 8.5*  HCT 26.9* 27.0*  PLT 97* 103*   BMET:  Recent Labs    02/02/20 0454 02/03/20 0208  NA 137 136  K 4.4 4.5  CL 104 106  CO2 25 23  GLUCOSE 105* 97  BUN 21 27*  CREATININE 1.14 1.08  CALCIUM 8.2* 8.2*    PT/INR:  Recent Labs    01/31/20 1444  LABPROT 16.1*  INR 1.4*   ABG    Component Value Date/Time   PHART 7.279 (L) 02/01/2020 0102   HCO3 21.9 02/01/2020 0102   TCO2 23 02/01/2020 0102   ACIDBASEDEF 4.0 (H) 02/01/2020 0102   O2SAT 91.0 02/01/2020 0102   CBG (last 3)  Recent Labs    02/02/20 2310 02/03/20 0324 02/03/20 0652  GLUCAP 85 77 109*    Assessment/Plan: S/P Procedure(s) (LRB): CORONARY ARTERY BYPASS GRAFTING (CABG) TIMES FIVE USING BOTH INTERNAL MAMMARY ARTERIES AND LEFT RADIAL  ARTERY. RIGHT CORONARY ENDARTERECTOMY (N/A) RADIAL ARTERY HARVEST (Left) TRANSESOPHAGEAL ECHOCARDIOGRAM (TEE) (N/A) INDOCYANINE GREEN FLUORESCENCE IMAGING (ICG) (N/A) Mobilize Diuresis place picc and remove neck line   LOS: 6 days    Daniel [name] 02/03/2020

## 2020-02-03 NOTE — Progress Notes (Signed)
Called Dr Prescott Gum to notify of patients increased work of breathing. Pt states he is having difficulty breathing and that his throat feels tight. Patient has audible upper airway wheeze. Lungs are diminished, no crackles auscultated. MD to enter orders.

## 2020-02-03 NOTE — Progress Notes (Signed)
At bedside for PICC placement.  Pt dyspneic.  Anderson Malta RN requests not to place PICC at this time due to status and ability to tolerate procedure.  Agree with request.  Anderson Malta RN to notify PICC RN when pt status improves.

## 2020-02-04 ENCOUNTER — Inpatient Hospital Stay (HOSPITAL_COMMUNITY): Payer: No Typology Code available for payment source

## 2020-02-04 LAB — CBC
HCT: 25 % — ABNORMAL LOW (ref 39.0–52.0)
Hemoglobin: 7.9 g/dL — ABNORMAL LOW (ref 13.0–17.0)
MCH: 27.9 pg (ref 26.0–34.0)
MCHC: 31.6 g/dL (ref 30.0–36.0)
MCV: 88.3 fL (ref 80.0–100.0)
Platelets: 122 10*3/uL — ABNORMAL LOW (ref 150–400)
RBC: 2.83 MIL/uL — ABNORMAL LOW (ref 4.22–5.81)
RDW: 15.3 % (ref 11.5–15.5)
WBC: 6.3 10*3/uL (ref 4.0–10.5)
nRBC: 0 % (ref 0.0–0.2)

## 2020-02-04 LAB — COMPREHENSIVE METABOLIC PANEL
ALT: 23 U/L (ref 0–44)
AST: 29 U/L (ref 15–41)
Albumin: 3.4 g/dL — ABNORMAL LOW (ref 3.5–5.0)
Alkaline Phosphatase: 53 U/L (ref 38–126)
Anion gap: 9 (ref 5–15)
BUN: 22 mg/dL (ref 8–23)
CO2: 27 mmol/L (ref 22–32)
Calcium: 8.6 mg/dL — ABNORMAL LOW (ref 8.9–10.3)
Chloride: 104 mmol/L (ref 98–111)
Creatinine, Ser: 0.98 mg/dL (ref 0.61–1.24)
GFR calc Af Amer: >60 mL/min (ref >60)
GFR calc non Af Amer: >60 mL/min (ref >60)
Glucose, Bld: 157 mg/dL — ABNORMAL HIGH (ref 70–99)
Potassium: 3.7 mmol/L (ref 3.5–5.1)
Sodium: 140 mmol/L (ref 135–145)
Total Bilirubin: 1.2 mg/dL (ref 0.3–1.2)
Total Protein: 5.4 g/dL — ABNORMAL LOW (ref 6.5–8.1)

## 2020-02-04 LAB — GLUCOSE, CAPILLARY
Glucose-Capillary: 138 mg/dL — ABNORMAL HIGH (ref 70–99)
Glucose-Capillary: 146 mg/dL — ABNORMAL HIGH (ref 70–99)
Glucose-Capillary: 151 mg/dL — ABNORMAL HIGH (ref 70–99)
Glucose-Capillary: 158 mg/dL — ABNORMAL HIGH (ref 70–99)
Glucose-Capillary: 159 mg/dL — ABNORMAL HIGH (ref 70–99)

## 2020-02-04 MED ORDER — THIAMINE HCL 100 MG PO TABS
100.0000 mg | ORAL_TABLET | Freq: Every day | ORAL | Status: DC
  Administered 2020-02-05 – 2020-02-06 (×2): 100 mg via ORAL
  Filled 2020-02-04 (×2): qty 1

## 2020-02-04 MED ORDER — POTASSIUM CHLORIDE CRYS ER 20 MEQ PO TBCR
20.0000 meq | EXTENDED_RELEASE_TABLET | ORAL | Status: AC
  Administered 2020-02-04 (×3): 20 meq via ORAL
  Filled 2020-02-04 (×3): qty 1

## 2020-02-04 MED ORDER — COLCHICINE 0.3 MG HALF TABLET
0.3000 mg | ORAL_TABLET | Freq: Two times a day (BID) | ORAL | Status: DC
  Administered 2020-02-04 – 2020-02-06 (×5): 0.3 mg via ORAL
  Filled 2020-02-04 (×7): qty 1

## 2020-02-04 MED ORDER — IBUPROFEN 200 MG PO TABS
400.0000 mg | ORAL_TABLET | Freq: Two times a day (BID) | ORAL | Status: DC
  Administered 2020-02-04 – 2020-02-06 (×5): 400 mg via ORAL
  Filled 2020-02-04 (×5): qty 2

## 2020-02-04 NOTE — Progress Notes (Signed)
CARDIAC REHAB PHASE I   PRE:  Rate/Rhythm: 68 SR    BP: sitting 102/67    SaO2: 94 RA  MODE:  Ambulation: 740 ft   POST:  Rate/Rhythm: 75 SR    BP: sitting 112/69     SaO2: 94 RA  Came earlier and pt had recently ambulated. Returned and pt initially wanted to wait till tomorrow to walk, some confusion noted. Pt needed reminders every few minutes of sternal precautions. Very pleasant and able to remember sternal precautions when asked. Used [place] this walk due to noted confusion but will progress to RW next. Fairly steady once ambulating, some sway noted. Used gait belt and assist x2 for precaution. Return to bed to be on alarm. VSS. Practiced IS, [phone] mL. Encouraged another walk later today. [place], ACSM 02/04/2020 1:42 PM

## 2020-02-04 NOTE — Progress Notes (Signed)
4 Days Post-Op Procedure(s) (LRB): CORONARY ARTERY BYPASS GRAFTING (CABG) TIMES FIVE USING BOTH INTERNAL MAMMARY ARTERIES AND LEFT RADIAL ARTERY. RIGHT CORONARY ENDARTERECTOMY (N/A) RADIAL ARTERY HARVEST (Left) TRANSESOPHAGEAL ECHOCARDIOGRAM (TEE) (N/A) INDOCYANINE GREEN FLUORESCENCE IMAGING (ICG) (N/A) Subjective: Feeling better  Objective: Vital signs in last 24 hours: Temp:  [97.8 F (36.6 C)-99 F (37.2 C)] 99 F (37.2 C) (09/27 0734) Pulse Rate:  [64-92] 68 (09/27 0625) Cardiac Rhythm: Normal sinus rhythm (09/26 2000) Resp:  [13-29] 23 (09/27 0625) BP: (85-191)/(43-107) 167/83 (09/27 0625) SpO2:  [91 %-100 %] 95 % (09/27 0625) Weight:  [95.4 kg] 95.4 kg (09/27 0530)  Hemodynamic parameters for last 24 hours:    Intake/Output from previous day: 09/26 0701 - 09/27 0700 In: 1375.8 [P.O.:660; I.V.:181.4; IV Piggyback:534.5] Out: 5771 [Urine:5070; Stool:1; Chest Tube:700] Intake/Output this shift: No intake/output data recorded.  General appearance: alert and cooperative Neurologic: intact Heart: regular rate and rhythm, S1, S2 normal, no murmur, click, rub or gallop Lungs: clear to auscultation bilaterally Abdomen: soft, non-tender; bowel sounds normal; no masses,  no organomegaly Extremities: extremities normal, atraumatic, no cyanosis or edema Wound: dressed, dry  Lab Results: Recent Labs    02/03/20 1657 02/04/20 0328  WBC 7.8 6.3  HGB 8.3* 7.9*  HCT 26.6* 25.0*  PLT 120* 122*   BMET:  Recent Labs    02/03/20 1657 02/04/20 0328  NA 136 140  K 3.7 3.7  CL 102 104  CO2 22 27  GLUCOSE 153* 157*  BUN 19 22  CREATININE 1.01 0.98  CALCIUM 8.2* 8.6*    PT/INR: No results for input(s): LABPROT, INR in the last 72 hours. ABG    Component Value Date/Time   PHART 7.279 (L) 02/01/2020 0102   HCO3 21.9 02/01/2020 0102   TCO2 23 02/01/2020 0102   ACIDBASEDEF 4.0 (H) 02/01/2020 0102   O2SAT 91.0 02/01/2020 0102   CBG (last 3)  Recent Labs     02/04/20 0030 02/04/20 0356 02/04/20 0607  GLUCAP 138* 158* 151*    Assessment/Plan: S/P Procedure(s) (LRB): CORONARY ARTERY BYPASS GRAFTING (CABG) TIMES FIVE USING BOTH INTERNAL MAMMARY ARTERIES AND LEFT RADIAL ARTERY. RIGHT CORONARY ENDARTERECTOMY (N/A) RADIAL ARTERY HARVEST (Left) TRANSESOPHAGEAL ECHOCARDIOGRAM (TEE) (N/A) INDOCYANINE GREEN FLUORESCENCE IMAGING (ICG) (N/A) Mobilize Diuresis See progression orders   LOS: 7 days    [name] 02/04/2020

## 2020-02-04 NOTE — Discharge Summary (Signed)
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                                                          +----------+--------+--------+--------+---------------------+------------------+  ECA        199      12                smooth and                                                                       homogeneous                               +----------+--------+--------+--------+---------------------+------------------+ Portions of this table do not appear on this page. +----------+--------+-------+--------+------------+             PSV cm/s EDV cms Describe Arm Pressure  +----------+--------+-------+--------+------------+  Subclavian 160      2                              +----------+--------+-------+--------+------------+ +---------+--------+--+--------+--+---------+  Vertebral PSV cm/s 56 EDV cm/s 21 Antegrade  +---------+--------+--+--------+--+---------+ Left Carotid Findings: +----------+--------+--------+--------+-------------------+--------+             PSV cm/s EDV cm/s Stenosis Describe            Comments  +----------+--------+--------+--------+-------------------+--------+  CCA Prox   87       17                                              +----------+--------+--------+--------+-------------------+--------+  CCA Distal 85       15                                               +----------+--------+--------+--------+-------------------+--------+  ICA Prox   178      51       40-59%   calcific and smooth           +----------+--------+--------+--------+-------------------+--------+  ICA Distal 99       21                                              +----------+--------+--------+--------+-------------------+--------+  ECA        222      13       >50%                                   +----------+--------+--------+--------+-------------------+--------+ +----------+--------+--------+--------+------------+  Subclavian PSV cm/s EDV cm/s Describe Arm Pressure  +----------+--------+--------+--------+------------+             194                                      +----------+--------+--------+--------+------------+ +---------+--------+---+--------+--+---------+  Vertebral PSV cm/s 106 EDV cm/s 27 Antegrade  +---------+--------+---+--------+--+---------+  ABI Findings: +--------+------------------+-----+---------+--------+  Right    Rt Pressure (mmHg) Index Waveform  Comment   +--------+------------------+-----+---------+--------+  Brachial 141                      triphasic           +--------+------------------+-----+---------+--------+  PTA      162                1.15                      +--------+------------------+-----+---------+--------+  DP       150                1.06                      +--------+------------------+-----+---------+--------+ +--------+------------------+-----+---------+-------+  Left     Lt Pressure (mmHg) Index Waveform  Comment  +--------+------------------+-----+---------+-------+  Brachial 135                      triphasic          +--------+------------------+-----+---------+-------+  PTA      158                1.12                     +--------+------------------+-----+---------+-------+  DP       157                1.11                     +--------+------------------+-----+---------+-------+  Right Doppler Findings:  +-----------+--------+-----+---------+--------+  Site        Pressure Index Doppler   Comments  +-----------+--------+-----+---------+--------+  Brachial    141            triphasic           +-----------+--------+-----+---------+--------+  Radial                     triphasic           +-----------+--------+-----+---------+--------+  Ulnar                      triphasic           +-----------+--------+-----+---------+--------+  Palmar Arch                triphasic           +-----------+--------+-----+---------+--------+  Left Doppler Findings: +-----------+--------+-----+---------+--------+  Site        Pressure Index Doppler   Comments  +-----------+--------+-----+---------+--------+  Brachial    135            triphasic           +-----------+--------+-----+---------+--------+  Radial                     triphasic           +-----------+--------+-----+---------+--------+  Ulnar                      triphasic           +-----------+--------+-----+---------+--------+  Palmar Arch                triphasic           +-----------+--------+-----+---------+--------+  Summary: Right Carotid: Velocities in the right ICA are consistent with a 40-59%                stenosis. Plaqueing in bulb and proximal ica. Left Carotid: Velocities in the left ICA are consistent with a 40-59% stenosis.               Plaqueing in Bulb and Proximal Ica. Vertebrals: Bilateral vertebral arteries demonstrate antegrade flow. Right ABI: Resting right ankle-brachial index is within normal range. No evidence of significant right lower extremity arterial disease. Left ABI: Resting left ankle-brachial index is within normal range. No evidence of significant left lower extremity arterial disease. Right Upper Extremity: Doppler waveforms remain within normal limits with right radial compression. Doppler waveforms remain within normal limits with right ulnar compression. Left Upper Extremity: Doppler waveforms remain within normal limits with left  radial compression. Doppler waveforms remain within normal limits with left ulnar compression.  Electronically signed by Ruta Hinds MD on 01/28/2020 at 5:13:51 PM.    Final    [place] EKG SITE RITE  Result Date: 02/02/2020 If Site Rite image not attached, placement could not be confirmed due to current cardiac rhythm.    Discharge Medications: Allergies as of 02/06/2020      Reactions   Shrimp [shellfish Allergy] Other (See Comments)   "heart attack symptoms", "chest pain, numbness in arms" about 20 years ago      Medication List    STOP taking these medications   aspirin 81 MG chewable tablet Replaced by: aspirin 81 MG EC tablet   Brilinta 90 MG Tabs tablet Generic drug: ticagrelor   isosorbide mononitrate 30 MG 24 hr tablet Commonly known as: IMDUR   metoprolol succinate 25 MG 24 hr tablet Commonly known as: TOPROL-XL   nitroGLYCERIN 0.4 MG SL tablet Commonly known as: NITROSTAT     TAKE these medications   APPLE CIDER VINEGAR PO Take 1 capsule by mouth daily.   aspirin 81 MG EC tablet Take 1 tablet (81 mg total) by mouth daily. Swallow whole. Replaces: aspirin 81 MG chewable tablet   atorvastatin 80 MG tablet Commonly known as: LIPITOR Take 1 tablet (80 mg total) by mouth daily. What changed:   medication strength  how  much to take   clopidogrel 75 MG tablet Commonly known as: PLAVIX Take 1 tablet (75 mg total) by mouth daily.   colchicine 0.6 MG tablet Take 0.5 tablets (0.3 mg total) by mouth 2 (two) times daily. For one month then stop.   FLUoxetine 40 MG capsule Commonly known as: PROZAC Take 40 mg by mouth 2 (two) times daily.   furosemide 40 MG tablet Commonly known as: LASIX Take 1 tablet (40 mg total) by mouth daily. For 5 days then stop.   isosorbide dinitrate 10 MG tablet Commonly known as: ISORDIL Take 1 tablet (10 mg total) by mouth 3 (three) times daily. For one month then stop.   lisinopril 2.5 MG tablet Commonly known as:  ZESTRIL Take 1 tablet (2.5 mg total) by mouth daily. What changed:   medication strength  how much to take   metoprolol tartrate 25 MG tablet Commonly known as: LOPRESSOR Take 1 tablet (25 mg total) by mouth 2 (two) times daily.   potassium chloride SA 20 MEQ tablet Commonly known as: KLOR-CON Take 1 tablet (20 mEq total) by mouth daily. For 5 days then stop.   traMADol 50 MG tablet Commonly known as: ULTRAM Take 1 tablet (50 mg total) by mouth every 4 (four) hours as needed for moderate pain.      The patient has been discharged on:   1.Beta Blocker:  Yes [  x ]                              No   [   ]                              If No, reason:  2.Ace Inhibitor/ARB: Yes [ x  ]                                     No  [    ]                                     If No, reason:  3.Statin:   Yes [  x ]                  No  [   ]                  If No, reason:  4.Ecasa:  Yes  [  x ]                  No   [   ]                  If No, reason:  Follow Up Appointments:  Follow-up Information    [name], MD. Go on 02/18/2020.   Specialty: Cardiothoracic Surgery Why: Appointment time is at 12:00 pm Contact information: [address] [place] [address] [phone]        Jettie Booze, MD. Go on 02/25/2020.   Specialties: Cardiology, Radiology, Interventional Cardiology Why: Appointment time is at 4:00 pm Contact information: [address] [place] [address] [phone]  SignedSharalyn Ink ZimmermanPA-C 02/06/2020, 8:18 AM

## 2020-02-04 NOTE — Discharge Instructions (Signed)

## 2020-02-05 ENCOUNTER — Inpatient Hospital Stay (HOSPITAL_COMMUNITY): Payer: No Typology Code available for payment source

## 2020-02-05 DIAGNOSIS — E782 Mixed hyperlipidemia: Secondary | ICD-10-CM

## 2020-02-05 DIAGNOSIS — Z720 Tobacco use: Secondary | ICD-10-CM

## 2020-02-05 LAB — COMPREHENSIVE METABOLIC PANEL
ALT: 23 U/L (ref 0–44)
AST: 23 U/L (ref 15–41)
Albumin: 3.6 g/dL (ref 3.5–5.0)
Alkaline Phosphatase: 57 U/L (ref 38–126)
Anion gap: 11 (ref 5–15)
BUN: 39 mg/dL — ABNORMAL HIGH (ref 8–23)
CO2: 26 mmol/L (ref 22–32)
Calcium: 9 mg/dL (ref 8.9–10.3)
Chloride: 103 mmol/L (ref 98–111)
Creatinine, Ser: 1.37 mg/dL — ABNORMAL HIGH (ref 0.61–1.24)
GFR calc Af Amer: >60 mL/min (ref >60)
GFR calc non Af Amer: 53 mL/min — ABNORMAL LOW (ref >60)
Glucose, Bld: 148 mg/dL — ABNORMAL HIGH (ref 70–99)
Potassium: 3.9 mmol/L (ref 3.5–5.1)
Sodium: 140 mmol/L (ref 135–145)
Total Bilirubin: 1.1 mg/dL (ref 0.3–1.2)
Total Protein: 5.6 g/dL — ABNORMAL LOW (ref 6.5–8.1)

## 2020-02-05 LAB — CBC
HCT: 27.4 % — ABNORMAL LOW (ref 39.0–52.0)
Hemoglobin: 8.5 g/dL — ABNORMAL LOW (ref 13.0–17.0)
MCH: 27.8 pg (ref 26.0–34.0)
MCHC: 31 g/dL (ref 30.0–36.0)
MCV: 89.5 fL (ref 80.0–100.0)
Platelets: 186 10*3/uL (ref 150–400)
RBC: 3.06 MIL/uL — ABNORMAL LOW (ref 4.22–5.81)
RDW: 15.3 % (ref 11.5–15.5)
WBC: 8.6 10*3/uL (ref 4.0–10.5)
nRBC: 0.2 % (ref 0.0–0.2)

## 2020-02-05 LAB — GLUCOSE, CAPILLARY
Glucose-Capillary: 215 mg/dL — ABNORMAL HIGH (ref 70–99)
Glucose-Capillary: 104 mg/dL — ABNORMAL HIGH (ref 70–99)
Glucose-Capillary: 112 mg/dL — ABNORMAL HIGH (ref 70–99)

## 2020-02-05 MED ORDER — FUROSEMIDE 40 MG PO TABS
40.0000 mg | ORAL_TABLET | Freq: Every day | ORAL | Status: DC
  Administered 2020-02-05 – 2020-02-06 (×2): 40 mg via ORAL
  Filled 2020-02-05 (×2): qty 1

## 2020-02-05 NOTE — Progress Notes (Signed)
Progress Note  Patient Name: Daniel [name] Date of Encounter: 02/05/2020  [facility] Cardiologist: Larae Grooms, MD   Subjective   Feels well.  Wants to go home.  Has been awaiting tele bed for transfer.   Inpatient Medications    Scheduled Meds: . acetaminophen  1,000 mg Oral Q6H   Or  . acetaminophen (TYLENOL) oral liquid 160 mg/5 mL  1,000 mg Per Tube Q6H  . aspirin EC  81 mg Oral Daily  . atorvastatin  80 mg Oral Daily  . bisacodyl  10 mg Oral Daily   Or  . bisacodyl  10 mg Rectal Daily  . budesonide (PULMICORT) nebulizer solution  0.5 mg Nebulization BID  . Chlorhexidine Gluconate Cloth  6 each Topical Daily  . colchicine  0.3 mg Oral BID  . docusate sodium  200 mg Oral Daily  . FLUoxetine  40 mg Oral BID  . furosemide  40 mg Oral Daily  . ibuprofen  400 mg Oral BID  . insulin aspart  0-24 Units Subcutaneous TID WC  . isosorbide dinitrate  10 mg Oral TID  . mouth rinse  15 mL Mouth Rinse BID  . metoprolol tartrate  25 mg Oral BID  . pantoprazole  40 mg Oral Daily  . sodium chloride flush  10-40 mL Intracatheter Q12H  . sodium chloride flush  3 mL Intravenous Q12H  . thiamine  100 mg Oral Daily   Continuous Infusions: . sodium chloride     PRN Meds: dextrose, fentaNYL (SUBLIMAZE) injection, haloperidol lactate, labetalol, metoprolol tartrate, ondansetron (ZOFRAN) IV, oxyCODONE, sodium chloride flush, sodium chloride flush, traMADol   Vital Signs    Vitals:   02/05/20 0400 02/05/20 0500 02/05/20 0754 02/05/20 0806  BP:    119/60  Pulse: (!) 56     Resp: 16     Temp:    97.8 F (36.6 C)  TempSrc:    Oral  SpO2: 94%  95% 96%  Weight:  92 kg    Height:        Intake/Output Summary (Last 24 hours) at 02/05/2020 0930 Last data filed at 02/05/2020 0800 Gross per 24 hour  Intake 840 ml  Output 1650 ml  Net -810 ml   Last 3 Weights 02/05/2020 02/04/2020 02/03/2020  Weight (lbs) 202 lb 13.2 oz 210 lb 5.1 oz 219 lb 9.3 oz  Weight (kg) 92 kg 95.4 kg  99.6 kg      Telemetry    NSR - Personally Reviewed  ECG      Physical Exam   GEN: No acute distress.   Neck: No JVD Cardiac: RRR, no murmurs, rubs, or gallops.  Respiratory: Wheezing to auscultation bilaterally. GI: Soft, nontender, non-distended  MS: No edema; No deformity.  Sternal wound healing well Neuro:  Nonfocal  Psych: Normal affect   Labs    High Sensitivity Troponin:  No results for input(s): TROPONINIHS in the last 720 hours.    Chemistry Recent Labs  Lab 02/03/20 0208 02/03/20 0208 02/03/20 1657 02/04/20 0328 02/05/20 0652  NA 136   < > 136 140 140  K 4.5   < > 3.7 3.7 3.9  CL 106   < > 102 104 103  CO2 23   < > 22 27 26   GLUCOSE 97   < > 153* 157* 148*  BUN 27*   < > 19 22 39*  CREATININE 1.08   < > 1.01 0.98 1.37*  CALCIUM 8.2*   < > 8.2*  8.6* 9.0  PROT 5.0*  --   --  5.4* 5.6*  ALBUMIN 3.2*  --   --  3.4* 3.6  AST 45*  --   --  29 23  ALT 25  --   --  23 23  ALKPHOS 65  --   --  53 57  BILITOT 0.5  --   --  1.2 1.1  GFRNONAA >60   < > >60 >60 53*  GFRAA >60   < > >60 >60 >60  ANIONGAP 7   < > 12 9 11    < > = values in this interval not displayed.     Hematology Recent Labs  Lab 02/03/20 1657 02/04/20 0328 02/05/20 0652  WBC 7.8 6.3 8.6  RBC 2.97* 2.83* 3.06*  HGB 8.3* 7.9* 8.5*  HCT 26.6* 25.0* 27.4*  MCV 89.6 88.3 89.5  MCH 27.9 27.9 27.8  MCHC 31.2 31.6 31.0  RDW 15.5 15.3 15.3  PLT 120* 122* 186    BNPNo results for input(s): BNP, PROBNP in the last 168 hours.   DDimer No results for input(s): DDIMER in the last 168 hours.   Radiology    DG Chest 2 View  Result Date: 02/05/2020 CLINICAL DATA:  Open heart surgery EXAM: CHEST - 2 VIEW COMPARISON:  02/04/2020 FINDINGS: Postoperative changes in the mediastinum. Loop recorder. Cardiac enlargement. No vascular congestion. Patchy infiltrates or atelectasis in the lungs, improving since previous study. The right chest tube is been removed. No significant residual pneumothorax.  Right PICC line is unchanged in position. IMPRESSION: 1. Interval removal of right chest tube. No significant residual pneumothorax. 2. Patchy infiltrates or atelectasis in the lungs, improving since previous study. Electronically Signed   By: Lucienne Capers M.D.   On: 02/05/2020 05:58   DG Chest Port 1 View  Result Date: 02/04/2020 CLINICAL DATA:  Chest tube after coronary artery bypass EXAM: PORTABLE CHEST 1 VIEW COMPARISON:  02/03/2020 FINDINGS: Postoperative changes in the mediastinum. Loop recorder. Shallow inspiration. Cardiac enlargement. Right upper lobe and perihilar infiltrate. Right chest tube. No definite residual pneumothorax. No pleural effusions. Right PICC line with tip over the cavoatrial junction. IMPRESSION: Right chest tube. No definite residual pneumothorax. Right upper lobe and perihilar infiltrate. Electronically Signed   By: Lucienne Capers M.D.   On: 02/04/2020 06:18    Cardiac Studies     Patient Profile     69 y.o. male with CAD, tobacco abuse  Assessment & Plan    Doing well post CABG.  He states he will be discharged today.  COntinue high dose statin, aspirin.  Will defer to cardiac surgery if it is ok to add clopidogrel as well given his aggressive disease over the past year.    He has done well with smoking cessation.  He wants to use the nicotine patches at home.  Would prescribe at discharge.  [facility] will sign off.   Medication Recommendations:  Nicotine patch at discharge Other recommendations (labs, testing, etc):  Start clopidogrel 75 mg daily if ok from a bleeding standpoint Follow up as an outpatient:  Will have APP note on AVS.   For questions or updates, please contact [place] Please consult [email or web address] for contact info under        Signed, Larae Grooms, MD  02/05/2020, 9:30 AM

## 2020-02-05 NOTE — Progress Notes (Signed)
CARDIAC REHAB PHASE I   PRE:  Rate/Rhythm: 56 SB    BP: sitting 129/59    SaO2: 96 RA  MODE:  Ambulation: 740 ft   POST:  Rate/Rhythm: 68 SR    BP: sitting 150/97     SaO2: 97 RA  Pt sts he is for d/c. More aware today, able to remember sternal precautions some of the time. Used RW in hall, standby assist x2 with gait belt. Steady, no LOB. Rest briefly x1. With fatigue, pt more reliant on his arms. To recliner after walk. We will need wife for education.  [place], ACSM 02/05/2020 10:38 AM

## 2020-02-06 ENCOUNTER — Other Ambulatory Visit: Payer: Self-pay | Admitting: Physician Assistant

## 2020-02-06 DIAGNOSIS — E785 Hyperlipidemia, unspecified: Secondary | ICD-10-CM

## 2020-02-06 LAB — CBC
HCT: 27.5 % — ABNORMAL LOW (ref 39.0–52.0)
Hemoglobin: 8.9 g/dL — ABNORMAL LOW (ref 13.0–17.0)
MCH: 28.9 pg (ref 26.0–34.0)
MCHC: 32.4 g/dL (ref 30.0–36.0)
MCV: 89.3 fL (ref 80.0–100.0)
Platelets: 190 10*3/uL (ref 150–400)
RBC: 3.08 MIL/uL — ABNORMAL LOW (ref 4.22–5.81)
RDW: 15.3 % (ref 11.5–15.5)
WBC: 7.2 10*3/uL (ref 4.0–10.5)
nRBC: 0.3 % — ABNORMAL HIGH (ref 0.0–0.2)

## 2020-02-06 LAB — COMPREHENSIVE METABOLIC PANEL
ALT: 25 U/L (ref 0–44)
AST: 22 U/L (ref 15–41)
Albumin: 3.2 g/dL — ABNORMAL LOW (ref 3.5–5.0)
Alkaline Phosphatase: 52 U/L (ref 38–126)
Anion gap: 9 (ref 5–15)
BUN: 32 mg/dL — ABNORMAL HIGH (ref 8–23)
CO2: 29 mmol/L (ref 22–32)
Calcium: 8.5 mg/dL — ABNORMAL LOW (ref 8.9–10.3)
Chloride: 103 mmol/L (ref 98–111)
Creatinine, Ser: 1.05 mg/dL (ref 0.61–1.24)
GFR calc Af Amer: >60 mL/min (ref >60)
GFR calc non Af Amer: >60 mL/min (ref >60)
Glucose, Bld: 89 mg/dL (ref 70–99)
Potassium: 3.4 mmol/L — ABNORMAL LOW (ref 3.5–5.1)
Sodium: 141 mmol/L (ref 135–145)
Total Bilirubin: 0.9 mg/dL (ref 0.3–1.2)
Total Protein: 5 g/dL — ABNORMAL LOW (ref 6.5–8.1)

## 2020-02-06 MED ORDER — ASPIRIN 81 MG PO TBEC: 81.0000 mg | DELAYED_RELEASE_TABLET | Freq: Every day | ORAL | 11 refills | Status: AC

## 2020-02-06 MED ORDER — ISOSORBIDE DINITRATE 10 MG PO TABS: 10.0000 mg | ORAL_TABLET | Freq: Three times a day (TID) | ORAL | 0 refills | Status: DC

## 2020-02-06 MED ORDER — ATORVASTATIN CALCIUM 80 MG PO TABS: 80.0000 mg | ORAL_TABLET | Freq: Every day | ORAL | 1 refills | Status: DC

## 2020-02-06 MED ORDER — METOPROLOL TARTRATE 25 MG PO TABS
25.0000 mg | ORAL_TABLET | Freq: Two times a day (BID) | ORAL | 1 refills | Status: DC
Start: 2020-02-06 — End: 2020-03-28

## 2020-02-06 MED ORDER — POTASSIUM CHLORIDE CRYS ER 20 MEQ PO TBCR: 20.0000 meq | EXTENDED_RELEASE_TABLET | Freq: Every day | ORAL | 0 refills | Status: DC

## 2020-02-06 MED ORDER — LISINOPRIL 2.5 MG PO TABS
2.5000 mg | ORAL_TABLET | Freq: Every day | ORAL | Status: DC
  Administered 2020-02-06: 2.5 mg via ORAL
  Filled 2020-02-06: qty 1

## 2020-02-06 MED ORDER — COLCHICINE 0.6 MG PO TABS: 0.3000 mg | ORAL_TABLET | Freq: Two times a day (BID) | ORAL | 0 refills | Status: DC

## 2020-02-06 MED ORDER — POTASSIUM CHLORIDE CRYS ER 20 MEQ PO TBCR
40.0000 meq | EXTENDED_RELEASE_TABLET | Freq: Two times a day (BID) | ORAL | Status: DC
  Administered 2020-02-06: 40 meq via ORAL
  Filled 2020-02-06: qty 2

## 2020-02-06 MED ORDER — LISINOPRIL 2.5 MG PO TABS
2.5000 mg | ORAL_TABLET | Freq: Every day | ORAL | 1 refills | Status: DC
Start: 2020-02-06 — End: 2020-03-28

## 2020-02-06 MED ORDER — CLOPIDOGREL BISULFATE 75 MG PO TABS: 75.0000 mg | ORAL_TABLET | Freq: Every day | ORAL | Status: DC

## 2020-02-06 MED ORDER — FUROSEMIDE 40 MG PO TABS
40.0000 mg | ORAL_TABLET | Freq: Every day | ORAL | 0 refills | Status: DC
Start: 2020-02-06 — End: 2020-02-18

## 2020-02-06 MED ORDER — CLOPIDOGREL BISULFATE 75 MG PO TABS
75.0000 mg | ORAL_TABLET | Freq: Every day | ORAL | 1 refills | Status: DC
Start: 2020-02-06 — End: 2020-07-24

## 2020-02-06 MED ORDER — TRAMADOL HCL 50 MG PO TABS
50.0000 mg | ORAL_TABLET | ORAL | 0 refills | Status: DC | PRN
Start: 2020-02-06 — End: 2020-08-03

## 2020-02-06 NOTE — Plan of Care (Signed)
  Problem: Health Behavior/Discharge Planning: Goal: Ability to manage health-related needs will improve Outcome: Progressing   Problem: Clinical Measurements: Goal: Ability to maintain clinical measurements within normal limits will improve Outcome: Progressing Goal: Will remain free from infection Outcome: Progressing Goal: Diagnostic test results will improve Outcome: Progressing Goal: Respiratory complications will improve Outcome: Progressing Goal: Cardiovascular complication will be avoided Outcome: Progressing   Problem: Activity: Goal: Risk for activity intolerance will decrease Outcome: Progressing   Problem: Nutrition: Goal: Adequate nutrition will be maintained Outcome: Progressing   Problem: Coping: Goal: Level of anxiety will decrease Outcome: Progressing   Problem: Elimination: Goal: Will not experience complications related to bowel motility Outcome: Progressing Goal: Will not experience complications related to urinary retention Outcome: Progressing   Problem: Pain Managment: Goal: General experience of comfort will improve Outcome: Progressing   Problem: Safety: Goal: Ability to remain free from injury will improve Outcome: Progressing   Problem: Skin Integrity: Goal: Risk for impaired skin integrity will decrease Outcome: Progressing   Problem: Education: Goal: Will demonstrate proper wound care and an understanding of methods to prevent future damage Outcome: Progressing Goal: Knowledge of disease or condition will improve Outcome: Progressing Goal: Knowledge of the prescribed therapeutic regimen will improve Outcome: Progressing Goal: Individualized Educational Video(s) Outcome: Progressing   Problem: Activity: Goal: Risk for activity intolerance will decrease Outcome: Progressing   Problem: Cardiac: Goal: Will achieve and/or maintain hemodynamic stability Outcome: Progressing   Problem: Clinical Measurements: Goal: Postoperative  complications will be avoided or minimized Outcome: Progressing   Problem: Respiratory: Goal: Respiratory status will improve Outcome: Progressing   Problem: Skin Integrity: Goal: Wound healing without signs and symptoms of infection Outcome: Progressing Goal: Risk for impaired skin integrity will decrease Outcome: Progressing   Problem: Urinary Elimination: Goal: Ability to achieve and maintain adequate renal perfusion and functioning will improve Outcome: Progressing

## 2020-02-06 NOTE — Progress Notes (Addendum)
      [address]       [place] [address]             [phone]        6 Days Post-Op Procedure(s) (LRB): CORONARY ARTERY BYPASS GRAFTING (CABG) TIMES FIVE USING BOTH INTERNAL MAMMARY ARTERIES AND LEFT RADIAL ARTERY. RIGHT CORONARY ENDARTERECTOMY (N/A) RADIAL ARTERY HARVEST (Left) TRANSESOPHAGEAL ECHOCARDIOGRAM (TEE) (N/A) INDOCYANINE GREEN FLUORESCENCE IMAGING (ICG) (N/A)  Subjective: Patient without complaints this am. He hopes to go home.  Objective: Vital signs in last 24 hours: Temp:  [97.8 F (36.6 C)-98.3 F (36.8 C)] 98.3 F (36.8 C) (09/29 0346) Pulse Rate:  [57-62] 58 (09/29 0346) Cardiac Rhythm: Sinus bradycardia (09/29 0700) Resp:  [17-20] 20 (09/29 0346) BP: (100-148)/(60-86) 147/61 (09/29 0346) SpO2:  [94 %-97 %] 96 % (09/29 0346) Weight:  [94.8 kg] 94.8 kg (09/29 0346)  Pre op weight  92.8 kg Current Weight  02/06/20 94.8 kg      Intake/Output from previous day: 09/28 0701 - 09/29 0700 In: 480 [P.O.:480] Out: 1041 [Urine:1040; Stool:1]   Physical Exam:  Cardiovascular: Slightly bradycardic Pulmonary: Expiratory wheezing Abdomen: Soft, non tender, bowel sounds present. Extremities: Mild bilateral lower extremity edema. LUE motor/sensory intact Wounds: Sternal wound is clean and dry.  No erythema or signs of infection. LUE dressing removed and wound is clean and dry.  Lab Results: CBC: Recent Labs    02/05/20 0652 02/06/20 0459  WBC 8.6 7.2  HGB 8.5* 8.9*  HCT 27.4* 27.5*  PLT 186 190   BMET:  Recent Labs    02/05/20 0652 02/06/20 0459  NA 140 141  K 3.9 3.4*  CL 103 103  CO2 26 29  GLUCOSE 148* 89  BUN 39* 32*  CREATININE 1.37* 1.05  CALCIUM 9.0 8.5*    PT/INR:  Lab Results  Component Value Date   INR 1.4 (H) 01/31/2020   INR 1.0 01/30/2020   INR 0.9 09/18/2014   ABG:  INR: Will add last result for INR, ABG once components are confirmed Will add last 4 CBG results once components are  confirmed  Assessment/Plan:  1. CV - SB at times (HR high 50's). On Isordil 10 mg tid, Lopressor 25 mg bid. Will discuss with Dr. Orvan Seen if should decrease BB;although HR mostly in 50's post op. Will start Plavix and is on baby ec asa. Will restart low dose Lisinopril for better BP control. 2.  Pulmonary - On room air. Encourage incentive spirometer. 3. Volume Overload - On Lasix 40 mg daily 4.  Expected post op acute blood loss anemia - H and H this am stable at 8.9 and 27.5 5. CBGs 215/104/112. Pre op HGA1C 5.6. Will stop accu checks and SS 6. Supplement potassium 7. Remove EPW 8. Encouraged smoking cessation-he states Dr. Irish Lack sent prescription for patch 9. Will discuss disposition with Dr. Liliane Bade discharge later today  Lakeisa Heninger M ZimmermanPA-C 02/06/2020,7:29 AM

## 2020-02-06 NOTE — Progress Notes (Signed)
10:15am: Pacer wires removed with second RN at bedside. Patient tolerated well, no complication with removal, sutures also removed. Patient on 1 hr bedrest.

## 2020-02-06 NOTE — Progress Notes (Addendum)
Pt with incontinence on my arrival, trying to clean up. I assisted. Pts wife unable to come to room for education. Discussed with pt sternal precautions (reiterated numerous times), IS, exercise, smoking cessation, and CRPII. Gave him diet sheets which have been discussed 4/21. Pt can voice his sternal precautions but he continuously uses his arms to move his hips or put foot up and down on recliner. I later found him folding his blanket with arms stretched out. Noted slight sternal bleed. Will attempt to call his wife to reiterate.  Pt plans to quit smoking. Will refer to [place] Yves Dill CES, ACSM 2:43 PM 02/06/2020   Attempted to call wife at home but no answer x3. No voicemail capability. [place], ACSM 3:12 PM 02/06/2020

## 2020-02-07 ENCOUNTER — Other Ambulatory Visit: Payer: Self-pay | Admitting: Physician Assistant

## 2020-02-08 ENCOUNTER — Telehealth (HOSPITAL_COMMUNITY): Payer: Self-pay

## 2020-02-08 NOTE — Telephone Encounter (Signed)
Called patient to see if he is interested in the Cardiac Rehab Program. Patient expressed interest. Explained scheduling process and went over insurance, patient verbalized understanding. Will contact patient for scheduling once f/u has been completed.  °

## 2020-02-15 ENCOUNTER — Other Ambulatory Visit: Payer: Self-pay | Admitting: Cardiothoracic Surgery

## 2020-02-15 DIAGNOSIS — Z951 Presence of aortocoronary bypass graft: Secondary | ICD-10-CM

## 2020-02-18 ENCOUNTER — Other Ambulatory Visit: Payer: Self-pay

## 2020-02-18 ENCOUNTER — Ambulatory Visit
Admission: RE | Admit: 2020-02-18 | Discharge: 2020-02-18 | Disposition: A | Discharge status: Home / Self Care | Payer: Medicare Other | Source: Ambulatory Visit | Attending: Cardiothoracic Surgery | Admitting: Cardiothoracic Surgery

## 2020-02-18 ENCOUNTER — Ambulatory Visit (INDEPENDENT_AMBULATORY_CARE_PROVIDER_SITE_OTHER): Payer: Self-pay | Admitting: Cardiothoracic Surgery

## 2020-02-18 VITALS — BP 94/60 | HR 96 | Temp 97.5°F | Resp 18 | Ht 71.0 in | Wt 187.0 lb

## 2020-02-18 DIAGNOSIS — Z951 Presence of aortocoronary bypass graft: Secondary | ICD-10-CM

## 2020-02-18 NOTE — Progress Notes (Signed)
      [address]       [place] [address]             [phone]     CARDIOTHORACIC SURGERY OFFICE NOTE  Referring Provider is Jettie Booze, MD Primary Cardiologist is Larae Grooms, MD PCP is Lawerance Cruel, MD   HPI:  69 yo man returns for 1st outpatient visit after recent CABG discharge. Denies angina or SOB. Has not been walking too much due to perceived weakness and hasn't been eating much due to poor appetite.    Current Outpatient Medications  Medication Sig Dispense Refill  . APPLE CIDER VINEGAR PO Take 1 capsule by mouth daily.    Marland Kitchen aspirin EC 81 MG EC tablet Take 1 tablet (81 mg total) by mouth daily. Swallow whole. 30 tablet 11  . atorvastatin (LIPITOR) 80 MG tablet Take 1 tablet (80 mg total) by mouth daily. 30 tablet 1  . clopidogrel (PLAVIX) 75 MG tablet Take 1 tablet (75 mg total) by mouth daily. 30 tablet 1  . colchicine 0.6 MG tablet Take 0.5 tablets (0.3 mg total) by mouth 2 (two) times daily. For one month then stop. 60 tablet 0  . FLUoxetine (PROZAC) 40 MG capsule Take 40 mg by mouth 2 (two) times daily.    . isosorbide dinitrate (ISORDIL) 10 MG tablet Take 1 tablet (10 mg total) by mouth 3 (three) times daily. For one month then stop. 90 tablet 0  . lisinopril (ZESTRIL) 2.5 MG tablet Take 1 tablet (2.5 mg total) by mouth daily. 30 tablet 1  . metoprolol tartrate (LOPRESSOR) 25 MG tablet Take 1 tablet (25 mg total) by mouth 2 (two) times daily. 60 tablet 1  . traMADol (ULTRAM) 50 MG tablet Take 1 tablet (50 mg total) by mouth every 4 (four) hours as needed for moderate pain. 30 tablet 0   No current facility-administered medications for this visit.      Physical Exam:   BP 94/60 (BP Location: Right Arm, Patient Position: Sitting)   Pulse 96   Temp (!) 97.5 F (36.4 C)   Resp 18   Ht 5\' 11"  (1.803 m)   Wt 84.8 kg   SpO2 99% Comment: RA with mask on  BMI 26.08 kg/m   General:  Well-appearing,  NAD  Chest:   cta  CV:   rrr  Incisions:  Well-healed  Abdomen:  sntnd  Extremities:  No edema  Diagnostic Tests:  Clear lung fields   Impression: Doing well after CABG  Plan:  Continue to observe sternal precautions Encouraged OTC meds for sleep Liberalize diet to encourage caloric intake  I spent in excess of 15 minutes during the conduct of this office consultation and >50% of this time involved direct face-to-face encounter with the patient for counseling and/or coordination of their care.  Level 2                 10 minutes Level 3                 15 minutes Level 4                 25 minutes Level 5                 40 minutes  B. Murvin Natal, MD 02/18/2020 2:34 PM
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
## 2020-02-25 ENCOUNTER — Other Ambulatory Visit: Payer: Self-pay

## 2020-02-25 ENCOUNTER — Encounter: Payer: Self-pay | Admitting: Interventional Cardiology

## 2020-02-25 ENCOUNTER — Ambulatory Visit (INDEPENDENT_AMBULATORY_CARE_PROVIDER_SITE_OTHER): Payer: No Typology Code available for payment source | Admitting: Interventional Cardiology

## 2020-02-25 VITALS — BP 122/68 | HR 72 | Ht 71.0 in | Wt 188.0 lb

## 2020-02-25 DIAGNOSIS — E782 Mixed hyperlipidemia: Secondary | ICD-10-CM | POA: Diagnosis not present

## 2020-02-25 DIAGNOSIS — Z9861 Coronary angioplasty status: Secondary | ICD-10-CM | POA: Diagnosis not present

## 2020-02-25 DIAGNOSIS — I10 Essential (primary) hypertension: Secondary | ICD-10-CM

## 2020-02-25 DIAGNOSIS — I2510 Atherosclerotic heart disease of native coronary artery without angina pectoris: Secondary | ICD-10-CM | POA: Diagnosis not present

## 2020-02-25 DIAGNOSIS — Z720 Tobacco use: Secondary | ICD-10-CM | POA: Diagnosis not present

## 2020-02-25 MED ORDER — NICOTINE 21 MG/24HR TD PT24: 21.0000 mg | MEDICATED_PATCH | Freq: Every day | TRANSDERMAL | 0 refills | Status: DC

## 2020-02-25 NOTE — Patient Instructions (Addendum)
Medication Instructions:  Your physician recommends that you continue on your current medications as directed. Please refer to the Current Medication list given to you today.   A prescription for Nicotine Patch 21 mg has been sent to your pharmacy. Use as directed.  *If you need a refill on your cardiac medications before your next appointment, please call your pharmacy*   Lab Work: None  If you have labs (blood work) drawn today and your tests are completely normal, you will receive your results only by: Marland Kitchen MyChart Message (if you have MyChart) OR . A paper copy in the mail If you have any lab test that is abnormal or we need to change your treatment, we will call you to review the results.   Testing/Procedures: None   Follow-Up: Follow up with Dr. Irish Lack on 06/28/19 at 4:00 PM  Other Instructions None

## 2020-03-03 ENCOUNTER — Emergency Department (HOSPITAL_COMMUNITY): Payer: No Typology Code available for payment source

## 2020-03-03 ENCOUNTER — Observation Stay (HOSPITAL_COMMUNITY)
Admission: EM | Admit: 2020-03-03 | Discharge: 2020-03-04 | Disposition: A | Discharge status: Home / Self Care | Payer: No Typology Code available for payment source | Attending: Internal Medicine | Admitting: Internal Medicine

## 2020-03-03 ENCOUNTER — Observation Stay (HOSPITAL_COMMUNITY): Payer: No Typology Code available for payment source

## 2020-03-03 ENCOUNTER — Other Ambulatory Visit: Payer: Self-pay | Admitting: Physician Assistant

## 2020-03-03 ENCOUNTER — Encounter (HOSPITAL_COMMUNITY): Payer: Self-pay | Admitting: Emergency Medicine

## 2020-03-03 ENCOUNTER — Other Ambulatory Visit: Payer: Self-pay

## 2020-03-03 DIAGNOSIS — I119 Hypertensive heart disease without heart failure: Secondary | ICD-10-CM | POA: Insufficient documentation

## 2020-03-03 DIAGNOSIS — I10 Essential (primary) hypertension: Secondary | ICD-10-CM | POA: Diagnosis not present

## 2020-03-03 DIAGNOSIS — Z951 Presence of aortocoronary bypass graft: Secondary | ICD-10-CM

## 2020-03-03 DIAGNOSIS — F17210 Nicotine dependence, cigarettes, uncomplicated: Secondary | ICD-10-CM | POA: Insufficient documentation

## 2020-03-03 DIAGNOSIS — R0602 Shortness of breath: Secondary | ICD-10-CM | POA: Diagnosis not present

## 2020-03-03 DIAGNOSIS — E876 Hypokalemia: Secondary | ICD-10-CM | POA: Insufficient documentation

## 2020-03-03 DIAGNOSIS — Z7982 Long term (current) use of aspirin: Secondary | ICD-10-CM | POA: Insufficient documentation

## 2020-03-03 DIAGNOSIS — I5032 Chronic diastolic (congestive) heart failure: Secondary | ICD-10-CM | POA: Insufficient documentation

## 2020-03-03 DIAGNOSIS — I25110 Atherosclerotic heart disease of native coronary artery with unstable angina pectoris: Secondary | ICD-10-CM | POA: Insufficient documentation

## 2020-03-03 DIAGNOSIS — Z20822 Contact with and (suspected) exposure to covid-19: Secondary | ICD-10-CM | POA: Insufficient documentation

## 2020-03-03 DIAGNOSIS — Z79899 Other long term (current) drug therapy: Secondary | ICD-10-CM | POA: Insufficient documentation

## 2020-03-03 LAB — CBC WITH DIFFERENTIAL/PLATELET
Abs Immature Granulocytes: 0.04 10*3/uL (ref 0.00–0.07)
Basophils Absolute: 0 10*3/uL (ref 0.0–0.1)
Basophils Relative: 0 %
Eosinophils Absolute: 0 10*3/uL (ref 0.0–0.5)
Eosinophils Relative: 0 %
HCT: 29.8 % — ABNORMAL LOW (ref 39.0–52.0)
Hemoglobin: 9.1 g/dL — ABNORMAL LOW (ref 13.0–17.0)
Immature Granulocytes: 0 %
Lymphocytes Relative: 9 %
Lymphs Abs: 0.9 10*3/uL (ref 0.7–4.0)
MCH: 25.5 pg — ABNORMAL LOW (ref 26.0–34.0)
MCHC: 30.5 g/dL (ref 30.0–36.0)
MCV: 83.5 fL (ref 80.0–100.0)
Monocytes Absolute: 0.9 10*3/uL (ref 0.1–1.0)
Monocytes Relative: 9 %
Neutro Abs: 8.2 10*3/uL — ABNORMAL HIGH (ref 1.7–7.7)
Neutrophils Relative %: 82 %
Platelets: 248 10*3/uL (ref 150–400)
RBC: 3.57 MIL/uL — ABNORMAL LOW (ref 4.22–5.81)
RDW: 14.6 % (ref 11.5–15.5)
WBC: 10.1 10*3/uL (ref 4.0–10.5)
nRBC: 0 % (ref 0.0–0.2)

## 2020-03-03 LAB — RESP PANEL BY RT PCR (RSV, FLU A&B, COVID)
Influenza A by PCR: NEGATIVE
Influenza B by PCR: NEGATIVE
Respiratory Syncytial Virus by PCR: NEGATIVE
SARS Coronavirus 2 by RT PCR: NEGATIVE

## 2020-03-03 LAB — COMPREHENSIVE METABOLIC PANEL
ALT: 12 U/L (ref 0–44)
AST: 17 U/L (ref 15–41)
Albumin: 2.7 g/dL — ABNORMAL LOW (ref 3.5–5.0)
Alkaline Phosphatase: 69 U/L (ref 38–126)
Anion gap: 10 (ref 5–15)
BUN: 7 mg/dL — ABNORMAL LOW (ref 8–23)
CO2: 25 mmol/L (ref 22–32)
Calcium: 8.2 mg/dL — ABNORMAL LOW (ref 8.9–10.3)
Chloride: 100 mmol/L (ref 98–111)
Creatinine, Ser: 0.93 mg/dL (ref 0.61–1.24)
GFR, Estimated: >60 mL/min (ref >60)
Glucose, Bld: 124 mg/dL — ABNORMAL HIGH (ref 70–99)
Potassium: 2.9 mmol/L — ABNORMAL LOW (ref 3.5–5.1)
Sodium: 135 mmol/L (ref 135–145)
Total Bilirubin: 1.2 mg/dL (ref 0.3–1.2)
Total Protein: 5.9 g/dL — ABNORMAL LOW (ref 6.5–8.1)

## 2020-03-03 LAB — D-DIMER, QUANTITATIVE: D-Dimer, Quant: 3.52 ug/mL-FEU — ABNORMAL HIGH (ref 0.00–0.50)

## 2020-03-03 LAB — URINALYSIS, ROUTINE W REFLEX MICROSCOPIC
Bilirubin Urine: NEGATIVE
Glucose, UA: NEGATIVE mg/dL
Ketones, ur: NEGATIVE mg/dL
Nitrite: NEGATIVE
Protein, ur: NEGATIVE mg/dL
Specific Gravity, Urine: 1.003 — ABNORMAL LOW (ref 1.005–1.030)
WBC, UA: >50 WBC/hpf — ABNORMAL HIGH (ref 0–5)
pH: 7 (ref 5.0–8.0)

## 2020-03-03 LAB — MAGNESIUM: Magnesium: 1.4 mg/dL — ABNORMAL LOW (ref 1.7–2.4)

## 2020-03-03 LAB — TROPONIN I (HIGH SENSITIVITY)
Troponin I (High Sensitivity): 44 ng/L — ABNORMAL HIGH (ref <18)
Troponin I (High Sensitivity): 35 ng/L — ABNORMAL HIGH (ref <18)

## 2020-03-03 LAB — BRAIN NATRIURETIC PEPTIDE: B Natriuretic Peptide: 541.1 pg/mL — ABNORMAL HIGH (ref 0.0–100.0)

## 2020-03-03 LAB — LACTIC ACID, PLASMA: Lactic Acid, Venous: 1.1 mmol/L (ref 0.5–1.9)

## 2020-03-03 MED ORDER — NICOTINE 21 MG/24HR TD PT24
21.0000 mg | MEDICATED_PATCH | Freq: Every day | TRANSDERMAL | Status: DC
  Administered 2020-03-03 – 2020-03-04 (×2): 21 mg via TRANSDERMAL
  Filled 2020-03-03 (×2): qty 1

## 2020-03-03 MED ORDER — MAGNESIUM OXIDE 400 (241.3 MG) MG PO TABS
400.0000 mg | ORAL_TABLET | Freq: Once | ORAL | Status: AC
  Administered 2020-03-03: 400 mg via ORAL
  Filled 2020-03-03: qty 1

## 2020-03-03 MED ORDER — POTASSIUM CHLORIDE CRYS ER 20 MEQ PO TBCR
40.0000 meq | EXTENDED_RELEASE_TABLET | Freq: Once | ORAL | Status: AC
  Administered 2020-03-03: 40 meq via ORAL
  Filled 2020-03-03: qty 2

## 2020-03-03 MED ORDER — ONDANSETRON HCL 4 MG/2ML IJ SOLN: 4.0000 mg | Freq: Four times a day (QID) | INTRAMUSCULAR | Status: DC | PRN

## 2020-03-03 MED ORDER — IPRATROPIUM-ALBUTEROL 0.5-2.5 (3) MG/3ML IN SOLN
3.0000 mL | Freq: Four times a day (QID) | RESPIRATORY_TRACT | Status: DC | PRN
  Administered 2020-03-03: 3 mL via RESPIRATORY_TRACT
  Filled 2020-03-03: qty 3

## 2020-03-03 MED ORDER — IOHEXOL 350 MG/ML SOLN
75.0000 mL | Freq: Once | INTRAVENOUS | Status: AC | PRN
  Administered 2020-03-03: 75 mL via INTRAVENOUS

## 2020-03-03 MED ORDER — FLUOXETINE HCL 20 MG PO CAPS
40.0000 mg | ORAL_CAPSULE | Freq: Two times a day (BID) | ORAL | Status: DC
  Administered 2020-03-03 – 2020-03-04 (×2): 40 mg via ORAL
  Filled 2020-03-03 (×4): qty 2

## 2020-03-03 MED ORDER — IPRATROPIUM-ALBUTEROL 0.5-2.5 (3) MG/3ML IN SOLN
3.0000 mL | Freq: Four times a day (QID) | RESPIRATORY_TRACT | Status: DC
  Administered 2020-03-03 (×2): 3 mL via RESPIRATORY_TRACT
  Filled 2020-03-03 (×2): qty 3

## 2020-03-03 MED ORDER — ENOXAPARIN SODIUM 40 MG/0.4ML ~~LOC~~ SOLN
40.0000 mg | SUBCUTANEOUS | Status: DC
  Administered 2020-03-03: 40 mg via SUBCUTANEOUS
  Filled 2020-03-03: qty 0.4

## 2020-03-03 MED ORDER — METOPROLOL TARTRATE 25 MG PO TABS
25.0000 mg | ORAL_TABLET | Freq: Two times a day (BID) | ORAL | Status: DC
  Administered 2020-03-03 – 2020-03-04 (×3): 25 mg via ORAL
  Filled 2020-03-03 (×3): qty 1

## 2020-03-03 MED ORDER — CLOPIDOGREL BISULFATE 75 MG PO TABS
75.0000 mg | ORAL_TABLET | Freq: Every day | ORAL | Status: DC
  Administered 2020-03-03 – 2020-03-04 (×2): 75 mg via ORAL
  Filled 2020-03-03 (×2): qty 1

## 2020-03-03 MED ORDER — ACETAMINOPHEN 650 MG RE SUPP: 650.0000 mg | Freq: Four times a day (QID) | RECTAL | Status: DC | PRN

## 2020-03-03 MED ORDER — ATORVASTATIN CALCIUM 80 MG PO TABS
80.0000 mg | ORAL_TABLET | Freq: Every day | ORAL | Status: DC
  Administered 2020-03-03 – 2020-03-04 (×2): 80 mg via ORAL
  Filled 2020-03-03 (×2): qty 1

## 2020-03-03 MED ORDER — TRAMADOL HCL 50 MG PO TABS
50.0000 mg | ORAL_TABLET | ORAL | Status: DC | PRN
  Administered 2020-03-04: 50 mg via ORAL
  Filled 2020-03-03: qty 1

## 2020-03-03 MED ORDER — LISINOPRIL 5 MG PO TABS
2.5000 mg | ORAL_TABLET | Freq: Every day | ORAL | Status: DC
  Administered 2020-03-04: 2.5 mg via ORAL
  Filled 2020-03-03 (×3): qty 1

## 2020-03-03 MED ORDER — MAGNESIUM SULFATE 2 GM/50ML IV SOLN
2.0000 g | Freq: Once | INTRAVENOUS | Status: AC
  Administered 2020-03-03: 2 g via INTRAVENOUS
  Filled 2020-03-03: qty 50

## 2020-03-03 MED ORDER — ACETAMINOPHEN 325 MG PO TABS: 650.0000 mg | ORAL_TABLET | Freq: Four times a day (QID) | ORAL | Status: DC | PRN

## 2020-03-03 MED ORDER — ASPIRIN EC 81 MG PO TBEC
81.0000 mg | DELAYED_RELEASE_TABLET | Freq: Every day | ORAL | Status: DC
  Administered 2020-03-03 – 2020-03-04 (×2): 81 mg via ORAL
  Filled 2020-03-03 (×2): qty 1

## 2020-03-03 MED ORDER — ONDANSETRON HCL 4 MG PO TABS: 4.0000 mg | ORAL_TABLET | Freq: Four times a day (QID) | ORAL | Status: DC | PRN

## 2020-03-03 MED ORDER — ACETAMINOPHEN 325 MG PO TABS
650.0000 mg | ORAL_TABLET | Freq: Once | ORAL | Status: AC
  Administered 2020-03-03: 650 mg via ORAL
  Filled 2020-03-03: qty 2

## 2020-03-03 NOTE — Progress Notes (Signed)
Pt just arrived to unit and made comfortable, Pt AOx4, family at bedside pt on Tele running SR.
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
## 2020-03-03 NOTE — ED Triage Notes (Signed)
Pt here from home with c/o sob had a CABG 3 weeks ago but has not been feeling well ,
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
## 2020-03-03 NOTE — H&P (Signed)
History and Physical    Daniel [name] CHE:[id number] DOB: 07-Jul-1950 DOA: 03/03/2020  I have briefly reviewed the patient's prior medical records in [place]  PCP: Lawerance Cruel, MD  Patient coming from: home  Chief Complaint: Shortness of breath  HPI: Daniel [name] is a 69 y.o. male with medical history significant of prior CVA, carotid stenosis, CAD with history of CABG 3 weeks ago, hypertension, hyperlipidemia, chronic systolic CHF, comes to the hospital with complaints of shortness of breath.  Patient reports a chronic history of dyspnea with exertion even before his CABG surgery, however in the last couple of days he noted himself to be very difficult to breathe, unable to do much because of shortness of breath and he finds himself at times "gasping for air".  He denies any chest pain, there is some soreness at the surgical site but otherwise negative.  He has a history of chronic longstanding tobacco use, quit smoking about a week ago and he has been on nicotine patch since.  Intermittently, he also has noticed some wheezing in the last couple of days.  He reports that had a runny nose yesterday and slight sore throat, and low-grade temp of 99.2-100 but no high fevers.  He reports loose stools since this morning also.  Denies any productive cough  ED Course: In the ED he has a temp of 99.2, normotensive, satting in the upper 90s on room air.  His blood work shows hypokalemia with a potassium of 2.9, magnesium 1.4, high-sensitivity troponin 44 >> 35.  Hemoglobin is 9.1.  Lactic acid is 1.1.  Covid is negative, chest x-ray without acute findings.  Review of Systems: All systems reviewed, and apart from HPI, all negative  Past Medical History:  Diagnosis Date  . Basal cell carcinoma of nose ~ 2012  . Carotid stenosis    a. Neck CTA 3/16:  mild bilat ICA calcific plaque (nothing > 50%)  . Coronary artery disease   . CVA (cerebral vascular accident) ([place]) 07/2014   "left hand  and part of that arm weak since" (09/24/2014)  . Depression    "since stroke in 07/2014"   . Essential hypertension   . Headache   . HLD (hyperlipidemia)   . Hx of echocardiogram    a.  Echo 3/16:  mild LVH, EF 40-45%, Gr 1 DD, mild LAE;  b.  TEE 3/16:  EF 55%, mild LAE, no LAA clot, neg bubble study    Past Surgical History:  Procedure Laterality Date  . CARDIAC CATHETERIZATION N/A 09/24/2014   Procedure: Left Heart Cath and Coronary Angiography;  Surgeon: Jettie Booze, MD;  Location: [place] [facility];  Service: Cardiovascular;  Laterality: N/A;  . CARDIAC CATHETERIZATION N/A 09/24/2014   Procedure: Coronary Stent Intervention;  Surgeon: Jettie Booze, MD;  Location: [facility];  Service: Cardiovascular;  Laterality: N/A;  . CARDIAC CATHETERIZATION N/A 09/25/2014   Procedure: Coronary Stent Intervention;  Surgeon: [name], MD;  Location: [facility];  Service: Cardiovascular;  Laterality: N/A;  . CIRCUMCISION    . CORONARY ARTERY BYPASS GRAFT N/A 01/31/2020   Procedure: CORONARY ARTERY BYPASS GRAFTING (CABG) TIMES FIVE USING BOTH INTERNAL MAMMARY ARTERIES AND LEFT RADIAL ARTERY. RIGHT CORONARY ENDARTERECTOMY;  Surgeon: [name], MD;  Location: [facility];  Service: Open Heart Surgery;  Laterality: N/A;  . CORONARY STENT INTERVENTION  08/28/2019  . CORONARY STENT INTERVENTION N/A 08/28/2019   Procedure: CORONARY STENT INTERVENTION;  Surgeon:  Jettie Booze, MD;  Location: [place] [facility];  Service: Cardiovascular;  Laterality: N/A;  . FRACTIONAL FLOW RESERVE WIRE  09/24/2014   Procedure: Fractional Flow Reserve Wire;  Surgeon: Jettie Booze, MD;  Location: [place] [facility];  Service: Cardiovascular;;  . INTRAVASCULAR PRESSURE WIRE/FFR STUDY N/A 08/28/2019   Procedure: INTRAVASCULAR PRESSURE WIRE/FFR STUDY;  Surgeon: Jettie Booze, MD;  Location: [place] [facility];  Service: Cardiovascular;  Laterality: N/A;  . INTRAVASCULAR PRESSURE  WIRE/FFR STUDY N/A 01/28/2020   Procedure: INTRAVASCULAR PRESSURE WIRE/FFR STUDY;  Surgeon: Jettie Booze, MD;  Location: [place] [facility];  Service: Cardiovascular;  Laterality: N/A;  . LEFT HEART CATH AND CORONARY ANGIOGRAPHY N/A 08/28/2019   Procedure: LEFT HEART CATH AND CORONARY ANGIOGRAPHY;  Surgeon: Jettie Booze, MD;  Location: [place] [facility];  Service: Cardiovascular;  Laterality: N/A;  . LEFT HEART CATH AND CORONARY ANGIOGRAPHY N/A 01/28/2020   Procedure: LEFT HEART CATH AND CORONARY ANGIOGRAPHY;  Surgeon: Jettie Booze, MD;  Location: [place] [facility];  Service: Cardiovascular;  Laterality: N/A;  . LOOP RECORDER IMPLANT N/A 07/31/2014   Procedure: LOOP RECORDER IMPLANT;  Surgeon: [name], MD;  Location: [facility];  Service: Cardiovascular;  Laterality: N/A;  . MOHS SURGERY  ~ 2012   "nose"  . RADIAL ARTERY HARVEST Left 01/31/2020   Procedure: RADIAL ARTERY HARVEST;  Surgeon: [name], MD;  Location: [facility];  Service: Open Heart Surgery;  Laterality: Left;  . TEE WITHOUT CARDIOVERSION N/A 07/31/2014   Procedure: TRANSESOPHAGEAL ECHOCARDIOGRAM (TEE);  Surgeon: Larey Dresser, MD;  Location: [place];  Service: Cardiovascular;  Laterality: N/A;  . TEE WITHOUT CARDIOVERSION N/A 01/31/2020   Procedure: TRANSESOPHAGEAL ECHOCARDIOGRAM (TEE);  Surgeon: [name], MD;  Location: [place];  Service: Open Heart Surgery;  Laterality: N/A;     reports that he has been smoking cigarettes. He has a 23.00 pack-year smoking history. He has never used smokeless tobacco. He reports current alcohol use. He reports that he does not use drugs.  Allergies  Allergen Reactions  . Shrimp [Shellfish Allergy] Other (See Comments)    "heart attack symptoms", "chest pain, numbness in arms" about 20 years ago    Family History  Problem Relation Age of Onset  . Heart failure Father   . Hypertension Father   . Heart attack Father   . Heart failure Brother   .  Hypertension Brother   . Stroke Brother   . Heart attack Mother   . Hypertension Mother   . Heart failure Paternal Uncle   . Hypertension Paternal Uncle   . Heart attack Brother     Prior to Admission medications   Medication Sig Start Date End Date Taking? Authorizing Provider  APPLE CIDER VINEGAR PO Take 1 capsule by mouth daily.   Yes [provider]  aspirin EC 81 MG EC tablet Take 1 tablet (81 mg total) by mouth daily. Swallow whole. 02/06/20  Yes Lars Pinks M, PA-C  atorvastatin (LIPITOR) 80 MG tablet Take 1 tablet (80 mg total) by mouth daily. 02/06/20  Yes Lars Pinks M, PA-C  clopidogrel (PLAVIX) 75 MG tablet Take 1 tablet (75 mg total) by mouth daily. 02/06/20  Yes Lars Pinks M, PA-C  FLUoxetine (PROZAC) 40 MG capsule Take 40 mg by mouth 2 (two) times daily. 10/12/18  Yes [provider]  lisinopril (ZESTRIL) 2.5 MG tablet Take 1 tablet (2.5 mg total) by mouth daily. 02/06/20  Yes  Lars Pinks M, PA-C  metoprolol tartrate (LOPRESSOR) 25 MG tablet Take 1 tablet (25 mg total) by mouth 2 (two) times daily. 02/06/20  Yes Tacy Dura, Donielle M, PA-C  nicotine (NICODERM CQ - DOSED IN MG/24 HOURS) 21 mg/24hr patch Place 1 patch (21 mg total) onto the skin daily. 02/25/20  Yes Jettie Booze, MD  traMADol (ULTRAM) 50 MG tablet Take 1 tablet (50 mg total) by mouth every 4 (four) hours as needed for moderate pain. 02/06/20  Yes Nani Skillern, PA-C    Physical Exam: Vitals:   03/03/20 0747 03/03/20 1015 03/03/20 1130 03/03/20 1158  BP:  (!) 145/80 133/80   Pulse:  88 83   Resp:  (!) 21 16   Temp:    98.3 F (36.8 C)  TempSrc:    Oral  SpO2:  95% 93%   Weight: 85.3 kg     Height: 5\' 11"  (1.803 m)       Constitutional: NAD, calm, comfortable Eyes: PERRL, lids and conjunctivae normal ENMT: Mucous membranes are moist.  Neck: normal, supple Respiratory: clear to auscultation bilaterally, no wheezing, no crackles. Normal  respiratory effort.  Cardiovascular: Regular rate and rhythm, no murmurs / rubs / gallops. No extremity edema. .  Abdomen: no tenderness, no masses palpated. Bowel sounds positive.  Musculoskeletal: no clubbing / cyanosis. Normal muscle tone.  Skin: no rashes, lesions, ulcers. No induration Neurologic: CN 2-12 grossly intact. Strength 5/5 in all 4.  Psychiatric: Normal judgment and insight. Alert and oriented x 3. Normal mood.   Labs on Admission: I have personally reviewed following labs and imaging studies  CBC: Recent Labs  Lab 03/03/20 0820  WBC 10.1  NEUTROABS 8.2*  HGB 9.1*  HCT 29.8*  MCV 83.5  PLT 462   Basic Metabolic Panel: Recent Labs  Lab 03/03/20 0820 03/03/20 0937  NA 135  --   K 2.9*  --   CL 100  --   CO2 25  --   GLUCOSE 124*  --   BUN 7*  --   CREATININE 0.93  --   CALCIUM 8.2*  --   MG  --  1.4*   Liver Function Tests: Recent Labs  Lab 03/03/20 0820  AST 17  ALT 12  ALKPHOS 69  BILITOT 1.2  PROT 5.9*  ALBUMIN 2.7*   Coagulation Profile: No results for input(s): INR, PROTIME in the last 168 hours. BNP (last 3 results) No results for input(s): PROBNP in the last 8760 hours. CBG: No results for input(s): GLUCAP in the last 168 hours. Thyroid Function Tests: No results for input(s): TSH, T4TOTAL, FREET4, T3FREE, THYROIDAB in the last 72 hours. Urine analysis:    Component Value Date/Time   COLORURINE YELLOW 03/03/2020 1201   APPEARANCEUR HAZY (A) 03/03/2020 1201   LABSPEC 1.003 (L) 03/03/2020 1201   PHURINE 7.0 03/03/2020 1201   GLUCOSEU NEGATIVE 03/03/2020 1201   HGBUR MODERATE (A) 03/03/2020 1201   BILIRUBINUR NEGATIVE 03/03/2020 1201   KETONESUR NEGATIVE 03/03/2020 1201   PROTEINUR NEGATIVE 03/03/2020 1201   UROBILINOGEN 1.0 07/27/2014 1827   NITRITE NEGATIVE 03/03/2020 1201   LEUKOCYTESUR LARGE (A) 03/03/2020 1201     Radiological Exams on Admission: DG Chest 2 View  Result Date: 03/03/2020 CLINICAL DATA:  Shortness of  breath.  Previous CABG. EXAM: CHEST - 2 VIEW COMPARISON:  02/18/2020 FINDINGS: Previous median sternotomy. Heart size is normal. Aortic atherosclerotic calcification as seen previously. No evidence of infiltrate, edema or effusion. No significant bone finding. IMPRESSION:  No active disease. Previous median sternotomy. Aortic atherosclerosis. Electronically Signed   By: Nelson Chimes M.D.   On: 03/03/2020 08:33    EKG: Independently reviewed.  Sinus rhythm  Assessment/Plan  Principal Problem Shortness of breath, dyspnea on exertion-reports degree of similar symptoms prior to his CABG, has been happening intermittently after CABG but more so in the last couple of days -Given extensive CAD and recent surgery, cardiology was consulted, appreciate input.  Defer repeat 2D echo to cardiology.  BNP is pending, but there are no signs of fluid overload at this point, no edema, no crackles, chest x-ray clear -Obtain a D-dimer, if positive will need a CT angiogram  Active Problems Sore throat, wheezing -Patient reports low-grade temp and nonspecific URI type picture with a sore throat over the last couple of days.  His Covid is negative which is reassuring.  He is vaccinated against Covid.  Suspect nonspecific viral illness -Likely has underlying COPD, underwent PFTs prior to CABG which showed moderately severe obstructive airway disease. -Start nebulizers, at discharge will likely need to be going home on some COPD medications at this point.  No wheezing presently, hold steroids, no productive cough, hold antibiotics.  Tobacco use, in remission-quit a week ago, continue nicotine patch  Essential hypertension -continue home medications  Coronary artery disease status post CABG-continue statin, aspirin, Plavix   DVT prophylaxis: Lovenox  Code Status: Full code  Family Communication: family at bedside  Disposition Plan: home when ready  Bed Type: telemetry  Consults called: cardiology by EDP   Obs/Inp: Obs   Marzetta Board, MD, PhD [facility]  Contact via [email or web address]  03/03/2020, 1:19 PM

## 2020-03-03 NOTE — ED Provider Notes (Signed)
Medical screening examination/treatment/procedure(s) were conducted as a shared visit with non-physician practitioner(s) and myself.  I personally evaluated the patient during the encounter.  EKG Interpretation  Date/Time:  Monday March 03 2020 07:30:47 EDT Ventricular Rate:  100 PR Interval:  154 QRS Duration: 90 QT Interval:  398 QTC Calculation: 513 R Axis:   -9 Text Interpretation: Normal sinus rhythm Cannot rule out Anterior infarct , age undetermined Prolonged QT Abnormal ECG no STEMI, no acute ischemic appearance compared to previous Confirmed by Charlesetta Shanks [phone]) on 03/03/2020 8:05:04 AM  Patient with history of CABG 3 weeks ago by Dr. Darcey Nora.  Patient reports that he was feeling well after the surgery but 3 days ago started becoming sick.  He reports he had a lot of coughing.  He has felt short of breath.  He reports some diffuse sensation of chest discomfort particularly with movements.  Today, he felt like he had a fever.  He reports he has been generally achy for 2 to 3 days.  He has had waxing and waning headache.  Just this morning he has a lot of nausea but is not vomiting.  He now has had one episode of diarrhea today.  Patient is alert and appropriate.  Clear mental status.  Mild tachypnea at rest.  Lungs grossly clear.  Some fine basilar rales.  Heart regular.  Cannot appreciate a definitve friction rub.  Abdomen is soft.  Patient endorses some mild diffuse discomfort to palpation but no guarding.  No peripheral edema.  Calves are soft and nontender.  Lower extremities are in good condition.  I agree with proceeding with broad diagnostic work-up for suspected infectious etiology of symptoms.  Patient has been Covid vaccinated however symptoms are suggestive.  We will proceed with x-rays and lab work.  At this time, patient is stable with out hypoxia or hypotension.  He does have low-grade fever.   Charlesetta Shanks, MD 03/03/20 [phone]

## 2020-03-03 NOTE — ED Notes (Signed)
Microbiology contacted, urine culture added on

## 2020-03-04 DIAGNOSIS — E876 Hypokalemia: Secondary | ICD-10-CM | POA: Diagnosis not present

## 2020-03-04 DIAGNOSIS — Z951 Presence of aortocoronary bypass graft: Secondary | ICD-10-CM | POA: Diagnosis not present

## 2020-03-04 DIAGNOSIS — R0602 Shortness of breath: Secondary | ICD-10-CM | POA: Diagnosis not present

## 2020-03-04 LAB — CBC
HCT: 28.2 % — ABNORMAL LOW (ref 39.0–52.0)
Hemoglobin: 9.1 g/dL — ABNORMAL LOW (ref 13.0–17.0)
MCH: 26.2 pg (ref 26.0–34.0)
MCHC: 32.3 g/dL (ref 30.0–36.0)
MCV: 81.3 fL (ref 80.0–100.0)
Platelets: 268 10*3/uL (ref 150–400)
RBC: 3.47 MIL/uL — ABNORMAL LOW (ref 4.22–5.81)
RDW: 14.8 % (ref 11.5–15.5)
WBC: 8.9 10*3/uL (ref 4.0–10.5)
nRBC: 0 % (ref 0.0–0.2)

## 2020-03-04 LAB — HIV ANTIBODY (ROUTINE TESTING W REFLEX): HIV Screen 4th Generation wRfx: NONREACTIVE

## 2020-03-04 LAB — COMPREHENSIVE METABOLIC PANEL
ALT: 15 U/L (ref 0–44)
AST: 23 U/L (ref 15–41)
Albumin: 2.6 g/dL — ABNORMAL LOW (ref 3.5–5.0)
Alkaline Phosphatase: 67 U/L (ref 38–126)
Anion gap: 9 (ref 5–15)
BUN: 5 mg/dL — ABNORMAL LOW (ref 8–23)
CO2: 22 mmol/L (ref 22–32)
Calcium: 8.1 mg/dL — ABNORMAL LOW (ref 8.9–10.3)
Chloride: 103 mmol/L (ref 98–111)
Creatinine, Ser: 0.87 mg/dL (ref 0.61–1.24)
GFR, Estimated: >60 mL/min (ref >60)
Glucose, Bld: 98 mg/dL (ref 70–99)
Potassium: 4.1 mmol/L (ref 3.5–5.1)
Sodium: 134 mmol/L — ABNORMAL LOW (ref 135–145)
Total Bilirubin: 1.1 mg/dL (ref 0.3–1.2)
Total Protein: 5.9 g/dL — ABNORMAL LOW (ref 6.5–8.1)

## 2020-03-04 MED ORDER — ALBUTEROL SULFATE HFA 108 (90 BASE) MCG/ACT IN AERS: 2.0000 | INHALATION_SPRAY | Freq: Four times a day (QID) | RESPIRATORY_TRACT | 0 refills | Status: DC | PRN

## 2020-03-04 MED ORDER — ENSURE ENLIVE PO LIQD: 237.0000 mL | Freq: Two times a day (BID) | ORAL | Status: DC

## 2020-03-04 NOTE — Care Management (Addendum)
Spoke to [place] with [facility] regarding referral to Cardiac Rehab. A phase one RN will come to patients room to see him.   Magdalen Spatz RN

## 2020-03-04 NOTE — Discharge Summary (Signed)
Physician Discharge Summary  Daniel [name] YHC:[id number] DOB: 03/12/1951 DOA: 03/03/2020  PCP: Lawerance Cruel, MD  Admit date: 03/03/2020 Discharge date: 03/04/2020  Admitted From: home Discharge disposition: home   Recommendations for Outpatient Follow-Up:   1. Addition of medications for COPD as needed- SABA started here 2. Cardiac rehab referral outpatient 3. BMP and Mg 1 week    Discharge Diagnosis:   Active Problems:   Primary hypertension   Shortness of breath    Discharge Condition: Improved.  Diet recommendation: Low sodium, heart healthy.  Wound care: None.  Code status: Full.   History of Present Illness:   Daniel [name] is a 69 y.o. male with medical history significant of prior CVA, carotid stenosis, CAD with history of CABG 3 weeks ago, hypertension, hyperlipidemia, chronic systolic CHF, comes to the hospital with complaints of shortness of breath.  Patient reports a chronic history of dyspnea with exertion even before his CABG surgery, however in the last couple of days he noted himself to be very difficult to breathe, unable to do much because of shortness of breath and he finds himself at times "gasping for air".  He denies any chest pain, there is some soreness at the surgical site but otherwise negative.  He has a history of chronic longstanding tobacco use, quit smoking about a week ago and he has been on nicotine patch since.  Intermittently, he also has noticed some wheezing in the last couple of days.  He reports that had a runny nose yesterday and slight sore throat, and low-grade temp of 99.2-100 but no high fevers.  He reports loose stools since this morning also.  Denies any productive cough   Hospital Course by Problem:   Shortness of breath, dyspnea on exertion -suspect COPD -CTA done to r/o PE and was negative -seen by cardiology: I suspect this is more related to COPD. BNP level is elevated but there is no clinical evidence of  volume overload by exam or imaging. No evidence of PE. No pericardial effusion. EF normal. No arrhythmia. I would optimize therapy for COPD and monitor. Would benefit from getting started in Cardiac Rehab.   Sore throat, wheezing -Patient reports low-grade temp and nonspecific URI type picture with a sore throat over the last couple of days.  His Covid is negative which is reassuring.  He is vaccinated against Covid.  Suspect nonspecific viral illness -Likely has underlying COPD, underwent PFTs prior to CABG: The FEV1, FVC and FEF25-75% are reduced but the FEV1/FVC ratio is normal. It is impossible to adequately evaluate the flow volume loop due to excessive variablity such as might be produced by coughing. Patient effort was erratic which makes it impossible to adequately evaluate the flow volume loops. The slow vital capacity is reduced. - No wheezing presently, hold steroids, no productive cough, hold antibiotics. -added albuterol inhaler PRN, may need scheduled meds  Tobacco use, in remission -quit a week ago -continue nicotine patch as needed  Essential hypertension  -continue home medications  Coronary artery disease status post CABG -continue statin, aspirin, Plavix  Hypomagnesemia/hypokalemia -repleted -monitor outpatient   Medical Consultants:   cards   Discharge Exam:   Vitals:   03/04/20 0900 03/04/20 1149  BP: (!) 141/72 124/75  Pulse: 73 68  Resp:  18  Temp:  (!) 97.4 F (36.3 C)  SpO2:  96%   Vitals:   03/03/20 2141 03/04/20 0630 03/04/20 0900 03/04/20 1149  BP: 134/77 (!) 154/80 Marland Kitchen)  141/72 124/75  Pulse: 79 80 73 68  Resp: 18 18  18   Temp: 99 F (37.2 C) 98.3 F (36.8 C)  (!) 97.4 F (36.3 C)  TempSrc: Oral Oral  Oral  SpO2: 100% 98%  96%  Weight:      Height:        General exam: Appears calm and comfortable.   The results of significant diagnostics from this hospitalization (including imaging, microbiology, ancillary and laboratory) are  listed below for reference.     Procedures and Diagnostic Studies:   DG Chest 2 View  Result Date: 03/03/2020 CLINICAL DATA:  Shortness of breath.  Previous CABG. EXAM: CHEST - 2 VIEW COMPARISON:  02/18/2020 FINDINGS: Previous median sternotomy. Heart size is normal. Aortic atherosclerotic calcification as seen previously. No evidence of infiltrate, edema or effusion. No significant bone finding. IMPRESSION: No active disease. Previous median sternotomy. Aortic atherosclerosis. Electronically Signed   By: Nelson Chimes M.D.   On: 03/03/2020 08:33   CT ANGIO CHEST PE W OR WO CONTRAST  Result Date: 03/03/2020 CLINICAL DATA:  Shortness of breath. EXAM: CT ANGIOGRAPHY CHEST WITH CONTRAST TECHNIQUE: Multidetector CT imaging of the chest was performed using the standard protocol during bolus administration of intravenous contrast. Multiplanar CT image reconstructions and MIPs were obtained to evaluate the vascular anatomy. CONTRAST:  18mL OMNIPAQUE IOHEXOL 350 MG/ML SOLN COMPARISON:  January 30, 2020. FINDINGS: Cardiovascular: Satisfactory opacification of the pulmonary arteries to the segmental level. No evidence of pulmonary embolism. Normal heart size. No pericardial effusion. Status post coronary bypass graft. Atherosclerosis of thoracic aorta is noted without aneurysm formation. Mediastinum/Nodes: No enlarged mediastinal, hilar, or axillary lymph nodes. Thyroid gland, trachea, and esophagus demonstrate no significant findings. Lungs/Pleura: Small right pleural effusion is noted. No pneumothorax is noted. Minimal bibasilar subsegmental atelectasis is noted. Upper Abdomen: No acute abnormality. Musculoskeletal: No chest wall abnormality. No acute or significant osseous findings. Review of the MIP images confirms the above findings. IMPRESSION: 1. No definite evidence of pulmonary embolus. 2. Small right pleural effusion is noted. 3. Minimal bibasilar subsegmental atelectasis. 4. Aortic atherosclerosis.  Aortic Atherosclerosis (ICD10-I70.0). Electronically Signed   By: Marijo Conception M.D.   On: 03/03/2020 15:40     Labs:   Basic Metabolic Panel: Recent Labs  Lab 03/03/20 0820 03/03/20 0937 03/04/20 0335  NA 135  --  134*  K 2.9*  --  4.1  CL 100  --  103  CO2 25  --  22  GLUCOSE 124*  --  98  BUN 7*  --  5*  CREATININE 0.93  --  0.87  CALCIUM 8.2*  --  8.1*  MG  --  1.4*  --    GFR Estimated Creatinine Clearance: 86.6 mL/min (by C-G formula based on SCr of 0.87 mg/dL). Liver Function Tests: Recent Labs  Lab 03/03/20 0820 03/04/20 0335  AST 17 23  ALT 12 15  ALKPHOS 69 67  BILITOT 1.2 1.1  PROT 5.9* 5.9*  ALBUMIN 2.7* 2.6*   No results for input(s): LIPASE, AMYLASE in the last 168 hours. No results for input(s): AMMONIA in the last 168 hours. Coagulation profile No results for input(s): INR, PROTIME in the last 168 hours.  CBC: Recent Labs  Lab 03/03/20 0820 03/04/20 0335  WBC 10.1 8.9  NEUTROABS 8.2*  --   HGB 9.1* 9.1*  HCT 29.8* 28.2*  MCV 83.5 81.3  PLT 248 268   Cardiac Enzymes: No results for input(s): CKTOTAL, CKMB, CKMBINDEX, TROPONINI in  the last 168 hours. BNP: Invalid input(s): POCBNP CBG: No results for input(s): GLUCAP in the last 168 hours. D-Dimer Recent Labs    03/03/20 0820  DDIMER 3.52*   Hgb A1c No results for input(s): HGBA1C in the last 72 hours. Lipid Profile No results for input(s): CHOL, HDL, LDLCALC, TRIG, CHOLHDL, LDLDIRECT in the last 72 hours. Thyroid function studies No results for input(s): TSH, T4TOTAL, T3FREE, THYROIDAB in the last 72 hours.  Invalid input(s): FREET3 Anemia work up No results for input(s): VITAMINB12, FOLATE, FERRITIN, TIBC, IRON, RETICCTPCT in the last 72 hours. Microbiology Recent Results (from the past 240 hour(s))  Resp Panel by RT PCR (RSV, Flu A&B, Covid) - Nasopharyngeal Swab     Status: None   Collection Time: 03/03/20  7:57 AM   Specimen: Nasopharyngeal Swab  Result Value Ref  Range Status   SARS Coronavirus 2 by RT PCR NEGATIVE NEGATIVE Final    Comment: (NOTE) SARS-CoV-2 target nucleic acids are NOT DETECTED.  The SARS-CoV-2 RNA is generally detectable in upper respiratoy specimens during the acute phase of infection. The lowest concentration of SARS-CoV-2 viral copies this assay can detect is 131 copies/mL. A negative result does not preclude SARS-Cov-2 infection and should not be used as the sole basis for treatment or other patient management decisions. A negative result may occur with  improper specimen collection/handling, submission of specimen other than nasopharyngeal swab, presence of viral mutation(s) within the areas targeted by this assay, and inadequate number of viral copies (<131 copies/mL). A negative result must be combined with clinical observations, patient history, and epidemiological information. The expected result is Negative.  Fact Sheet for Patients:  PinkCheek.be  Fact Sheet for Healthcare Providers:  GravelBags.it  This test is no t yet approved or cleared by the [place] FDA and  has been authorized for detection and/or diagnosis of SARS-CoV-2 by FDA under an Emergency Use Authorization (EUA). This EUA will remain  in effect (meaning this test can be used) for the duration of the COVID-19 declaration under Section 564(b)(1) of the Act, 21 U.S.C. section 360bbb-3(b)(1), unless the authorization is terminated or revoked sooner.     Influenza A by PCR NEGATIVE NEGATIVE Final   Influenza B by PCR NEGATIVE NEGATIVE Final    Comment: (NOTE) The Xpert Xpress SARS-CoV-2/FLU/RSV assay is intended as an aid in  the diagnosis of influenza from Nasopharyngeal swab specimens and  should not be used as a sole basis for treatment. Nasal washings and  aspirates are unacceptable for Xpert Xpress SARS-CoV-2/FLU/RSV  testing.  Fact Sheet for  Patients: PinkCheek.be  Fact Sheet for Healthcare Providers: GravelBags.it  This test is not yet approved or cleared by the [place] FDA and  has been authorized for detection and/or diagnosis of SARS-CoV-2 by  FDA under an Emergency Use Authorization (EUA). This EUA will remain  in effect (meaning this test can be used) for the duration of the  Covid-19 declaration under Section 564(b)(1) of the Act, 21  U.S.C. section 360bbb-3(b)(1), unless the authorization is  terminated or revoked.    Respiratory Syncytial Virus by PCR NEGATIVE NEGATIVE Final    Comment: (NOTE) Fact Sheet for Patients: PinkCheek.be  Fact Sheet for Healthcare Providers: GravelBags.it  This test is not yet approved or cleared by the [place] FDA and  has been authorized for detection and/or diagnosis of SARS-CoV-2 by  FDA under an Emergency Use Authorization (EUA). This EUA will remain  in effect (meaning this test can be used) for  the duration of the  COVID-19 declaration under Section 564(b)(1) of the Act, 21 U.S.C.  section 360bbb-3(b)(1), unless the authorization is terminated or  revoked. Performed at [facility], [place] [address] [place], [place] [address]   Blood culture (routine x 2)     Status: None (Preliminary result)   Collection Time: 03/03/20  8:20 AM   Specimen: BLOOD  Result Value Ref Range Status   Specimen Description BLOOD RIGHT ANTECUBITAL  Final   Special Requests   Final    BOTTLES DRAWN AEROBIC AND ANAEROBIC Blood Culture adequate volume   Culture   Final    NO GROWTH < 24 HOURS Performed at [facility], [place] [address] [place], [place] [address]    Report Status PENDING  Incomplete  Blood culture (routine x 2)     Status: None (Preliminary result)   Collection Time: 03/03/20  8:50 AM   Specimen: BLOOD RIGHT ARM  Result Value Ref Range Status    Specimen Description BLOOD RIGHT ARM  Final   Special Requests   Final    BOTTLES DRAWN AEROBIC AND ANAEROBIC Blood Culture adequate volume   Culture   Final    NO GROWTH < 24 HOURS Performed at [facility], [place] [address], [place], [place] [address]    Report Status PENDING  Incomplete  Urine culture     Status: Abnormal (Preliminary result)   Collection Time: 03/03/20  1:00 PM   Specimen: Urine, Random  Result Value Ref Range Status   Specimen Description URINE, RANDOM  Final   Special Requests NONE  Final   Culture (A)  Final    >=100,000 COLONIES/mL GRAM NEGATIVE RODS SUSCEPTIBILITIES TO FOLLOW Performed at [facility], [place] [address] [place], [place] [address]    Report Status PENDING  Incomplete     Discharge Instructions:   Discharge Instructions    Diet - low sodium heart healthy   Complete by: As directed    Discharge instructions   Complete by: As directed    Smoking cessation Cardiac rehab   Increase activity slowly   Complete by: As directed      Allergies as of 03/04/2020      Reactions   Shrimp [shellfish Allergy] Other (See Comments)   "heart attack symptoms", "chest pain, numbness in arms" about 20 years ago      Medication List    TAKE these medications   albuterol 108 (90 Base) MCG/ACT inhaler Commonly known as: VENTOLIN HFA Inhale 2 puffs into the lungs every 6 (six) hours as needed for wheezing or shortness of breath.   APPLE CIDER VINEGAR PO Take 1 capsule by mouth daily.   aspirin 81 MG EC tablet Take 1 tablet (81 mg total) by mouth daily. Swallow whole.   atorvastatin 80 MG tablet Commonly known as: LIPITOR Take 1 tablet (80 mg total) by mouth daily.   clopidogrel 75 MG tablet Commonly known as: PLAVIX Take 1 tablet (75 mg total) by mouth daily.   FLUoxetine 40 MG capsule Commonly known as: PROZAC Take 40 mg by mouth 2 (two) times daily.   lisinopril 2.5 MG tablet Commonly known as: ZESTRIL Take 1 tablet  (2.5 mg total) by mouth daily.   metoprolol tartrate 25 MG tablet Commonly known as: LOPRESSOR Take 1 tablet (25 mg total) by mouth 2 (two) times daily.   nicotine 21 mg/24hr patch Commonly known as: NICODERM CQ - dosed in mg/24 hours Place 1 patch (  21 mg total) onto the skin daily.   traMADol 50 MG tablet Commonly known as: ULTRAM Take 1 tablet (50 mg total) by mouth every 4 (four) hours as needed for moderate pain.         Time coordinating discharge: 25 min  Signed:  Geradine Girt DO  [facility] 03/04/2020, 2:41 PM

## 2020-03-04 NOTE — Progress Notes (Signed)
Pt verbalized understanding of discharge instructions. Wife came and picked patient up. Vitals WDL. Pt had no complaints.

## 2020-03-04 NOTE — Progress Notes (Signed)
Progress Note  Patient Name: Daniel [name] Date of Encounter: 03/04/2020  [facility] Cardiologist: Larae Grooms, MD   Subjective    Feels better this am. Less SOB. Mild cough of thick sputum  Inpatient Medications    Scheduled Meds: . aspirin EC  81 mg Oral Daily  . atorvastatin  80 mg Oral Daily  . clopidogrel  75 mg Oral Daily  . enoxaparin (LOVENOX) injection  40 mg Subcutaneous Q24H  . FLUoxetine  40 mg Oral BID  . lisinopril  2.5 mg Oral Daily  . metoprolol tartrate  25 mg Oral BID  . nicotine  21 mg Transdermal Daily   Continuous Infusions:  PRN Meds: acetaminophen **OR** acetaminophen, ipratropium-albuterol, ondansetron **OR** ondansetron (ZOFRAN) IV, traMADol   Vital Signs    Vitals:   03/03/20 1840 03/03/20 1929 03/03/20 2141 03/04/20 0630  BP: (!) 151/74  134/77 (!) 154/80  Pulse: 78  79 80  Resp: 14  18 18   Temp: 98.5 F (36.9 C)  99 F (37.2 C) 98.3 F (36.8 C)  TempSrc: Oral  Oral Oral  SpO2: 93% 98% 100% 98%  Weight:      Height:        Intake/Output Summary (Last 24 hours) at 03/04/2020 0917 Last data filed at 03/03/2020 2230 Gross per 24 hour  Intake 50 ml  Output 150 ml  Net -100 ml   Last 3 Weights 03/03/2020 02/25/2020 02/18/2020  Weight (lbs) 188 lb 0.8 oz 188 lb 187 lb  Weight (kg) 85.3 kg 85.276 kg 84.823 kg      Telemetry    NSR - Personally Reviewed  ECG    NSR with incomplete RBBB. Prolonged QTc- Personally Reviewed  Physical Exam   GEN: No acute distress.   Neck: No JVD Cardiac: RRR, no murmurs, rubs, or gallops.  Respiratory: Clear to auscultation bilaterally. GI: Soft, nontender, non-distended  MS: No edema; No deformity. Neuro:  Nonfocal  Psych: Normal affect   Labs    High Sensitivity Troponin:   Recent Labs  Lab 03/03/20 0820 03/03/20 1010  TROPONINIHS 44* 35*      Chemistry Recent Labs  Lab 03/03/20 0820 03/04/20 0335  NA 135 134*  K 2.9* 4.1  CL 100 103  CO2 25 22  GLUCOSE  124* 98  BUN 7* 5*  CREATININE 0.93 0.87  CALCIUM 8.2* 8.1*  PROT 5.9* 5.9*  ALBUMIN 2.7* 2.6*  AST 17 23  ALT 12 15  ALKPHOS 69 67  BILITOT 1.2 1.1  GFRNONAA >60 >60  ANIONGAP 10 9     Hematology Recent Labs  Lab 03/03/20 0820 03/04/20 0335  WBC 10.1 8.9  RBC 3.57* 3.47*  HGB 9.1* 9.1*  HCT 29.8* 28.2*  MCV 83.5 81.3  MCH 25.5* 26.2  MCHC 30.5 32.3  RDW 14.6 14.8  PLT 248 268    BNP Recent Labs  Lab 03/03/20 0820  BNP 541.1*     DDimer  Recent Labs  Lab 03/03/20 0820  DDIMER 3.52*     Radiology    DG Chest 2 View  Result Date: 03/03/2020 CLINICAL DATA:  Shortness of breath.  Previous CABG. EXAM: CHEST - 2 VIEW COMPARISON:  02/18/2020 FINDINGS: Previous median sternotomy. Heart size is normal. Aortic atherosclerotic calcification as seen previously. No evidence of infiltrate, edema or effusion. No significant bone finding. IMPRESSION: No active disease. Previous median sternotomy. Aortic atherosclerosis. Electronically Signed   By: Nelson Chimes M.D.   On: 03/03/2020 08:33   CT  ANGIO CHEST PE W OR WO CONTRAST  Result Date: 03/03/2020 CLINICAL DATA:  Shortness of breath. EXAM: CT ANGIOGRAPHY CHEST WITH CONTRAST TECHNIQUE: Multidetector CT imaging of the chest was performed using the standard protocol during bolus administration of intravenous contrast. Multiplanar CT image reconstructions and MIPs were obtained to evaluate the vascular anatomy. CONTRAST:  38mL OMNIPAQUE IOHEXOL 350 MG/ML SOLN COMPARISON:  January 30, 2020. FINDINGS: Cardiovascular: Satisfactory opacification of the pulmonary arteries to the segmental level. No evidence of pulmonary embolism. Normal heart size. No pericardial effusion. Status post coronary bypass graft. Atherosclerosis of thoracic aorta is noted without aneurysm formation. Mediastinum/Nodes: No enlarged mediastinal, hilar, or axillary lymph nodes. Thyroid gland, trachea, and esophagus demonstrate no significant findings.  Lungs/Pleura: Small right pleural effusion is noted. No pneumothorax is noted. Minimal bibasilar subsegmental atelectasis is noted. Upper Abdomen: No acute abnormality. Musculoskeletal: No chest wall abnormality. No acute or significant osseous findings. Review of the MIP images confirms the above findings. IMPRESSION: 1. No definite evidence of pulmonary embolus. 2. Small right pleural effusion is noted. 3. Minimal bibasilar subsegmental atelectasis. 4. Aortic atherosclerosis. Aortic Atherosclerosis (ICD10-I70.0). Electronically Signed   By: Marijo Conception M.D.   On: 03/03/2020 15:40    Cardiac Studies   ECHO: INTRAOPERATIVE TEE 01/31/2020 POST-OP IMPRESSIONS  - Left Ventricle: The left ventricle is unchanged from pre-bypass.  - Aorta: The aorta appears unchanged from pre-bypass.  - Left Atrial Appendage: The left atrial appendage appears unchanged from  pre-bypass.  - Aortic Valve: The aortic valve appears unchanged from pre-bypass.  - Mitral Valve: Essentially unchanged. Mild MR that continued to improve  post  bypass.  - Tricuspid Valve: The tricuspid valve appears unchanged from pre-bypass.   PRE-OP FINDINGS  Left Ventricle: The left ventricle has normal systolic function, with an  ejection fraction of 55-60%. The cavity size was normal. There is  concentric left ventricular hypertrophy.   Right Ventricle: The right ventricle has normal systolic function. The  cavity was normal. There is no increase in right ventricular wall  thickness.   Left Atrium: Left atrial size was dilated. The left atrial appendage is  well visualized and there is no evidence of thrombus present.   Right Atrium: Right atrial size was normal in size.   Interatrial Septum: No atrial level shunt detected by color flow Doppler.   Pericardium: There is no evidence of pericardial effusion.   Mitral Valve: The mitral valve is normal in structure. Mild thickening of  the mitral valve leaflet. Mitral valve  regurgitation is trivial by color  flow Doppler. There is no evidence of mitral valve vegetation.   Tricuspid Valve: The tricuspid valve was normal in structure. Tricuspid  valve regurgitation is trivial by color flow Doppler.   Aortic Valve: The aortic valve is tricuspid Aortic valve regurgitation was  not visualized by color flow Doppler. There is no evidence of aortic valve  stenosis. There is no evidence of a vegetation on the aortic valve.   Pulmonic Valve: The pulmonic valve was normal in structure.  Pulmonic valve regurgitation is trivial by color flow Doppler.    Aorta: There is evidence of atheroma immobile plaque in the descending  aorta; Grade III, measuring 3-31mm in size.   CATH: 01/28/2020  Prox Cx to Mid Cx lesion is 75% stenosed.  2nd Mrg lesion is 70% stenosed.  1st Mrg lesion is 99% stenosed.  Ost LAD to Prox LAD lesion is 75% stenosed. Dist LAD lesion is 50% stenosed. Abnormal DFR indicating ischemia  in LAD after the distal lesion.  Previously placed Mid LAD-1 stent (unknown type) is widely patent.  Previously placed Mid LAD-2 stent (unknown type) is widely patent.  Previously placed Mid LAD to Dist LAD stent (unknown type) is widely patent.  Ost RCA stent with restenosis of 50% stenosed.  Prox RCA to Mid RCA lesion is 40% stenosed.  The left ventricular systolic function is normal.  LV end diastolic pressure is normal.  The left ventricular ejection fraction is 55-65% by visual estimate.  There is no aortic valve stenosis.  Worsening of symptoms. Ostial LAD is worse than prior cath. He has significant disease in the very tortuous circumflex system with some progression of disease in proximal vessel compared to prior cath. Difficult to engage ostial RCA stent but there is some restenosis there as well. Will get cardiac surgery consult for CABG given his worsening sx and high risk anatomy of left main equivalent.  Will need time for Brilinta  washout as well. Results discussed with the wife.  Diagnostic Dominance: Right   Patient Profile     69 y.o. male with a hx of prior CVA, carotid stenosis, CAD w/ hx mult  with history of CABG 3 weeks ago, hypertension, hyperlipidemia, chronic systolic CHF, tob use, DVT/PE, remote EtOH (quit 2017), tobacco abuse (quit 01/28/2016), hemothorax s/p VATS who is being seen today for the evaluation of SOB at the request of Dr Cruzita Lederer.  Assessment & Plan    1. Dyspnea. I suspect this is more related to COPD. BNP level is elevated but there is no clinical evidence of volume overload by exam or imaging. No evidence of PE. No pericardial effusion. EF normal. No arrhythmia. I would optimize therapy for COPD and monitor. Would benefit from getting started in Cardiac Rehab.  2. S/p recent CABG continue ASA, Plavix, high dose statin. Could consider switching metoprolol to a more selective beta blocker like bisoprolol but he has been on metoprolol for a long time and tolerated well.  [facility] will sign off.   Medication Recommendations:  As per [facility] Other recommendations (labs, testing, etc):  None- start cardiac Rehab. Follow up as an outpatient:  In 3 weeks with Dr Irish Lack.   For questions or updates, please contact [place] Please consult [email or web address] for contact info under        Signed, Alizeh Madril Martinique, MD  03/04/2020, 9:17 AM

## 2020-03-04 NOTE — Progress Notes (Signed)
Initial Nutrition Assessment  DOCUMENTATION CODES:   Not applicable  INTERVENTION:  Provide Ensure Enlive po BID, each supplement provides 350 kcal and 20 grams of protein  Encourage adequate PO intake.   NUTRITION DIAGNOSIS:   Increased nutrient needs related to chronic illness (CHF, COPD) as evidenced by estimated needs.  GOAL:   Patient will meet greater than or equal to 90% of their needs  MONITOR:   PO intake, Supplement acceptance, Skin, Weight trends, Labs, I & O's  REASON FOR ASSESSMENT:   Malnutrition Screening Tool    ASSESSMENT:   69 y.o. male with medical history significant of prior CVA, carotid stenosis, CAD with history of CABG 3 weeks ago, hypertension, hyperlipidemia, chronic systolic CHF, comes to the hospital with complaints of shortness of breath. Pt with dyspnea related to COPD.   Pt unavailable during time of contact. RD unable to obtain pt nutrition history at this time. RD to order nutritional supplements to aid in caloric and protein needs. Unable to complete Nutrition-Focused physical exam at this time.   Labs and medications reviewed.   Diet Order:   Diet Order            Diet - low sodium heart healthy           Diet Heart Room service appropriate? Yes; Fluid consistency: Thin  Diet effective now                 EDUCATION NEEDS:   Not appropriate for education at this time  Skin:  Skin Assessment: Reviewed RN Assessment  Last BM:  10/25  Height:   Ht Readings from Last 1 Encounters:  03/03/20 5\' 11"  (1.803 m)    Weight:   Wt Readings from Last 1 Encounters:  03/03/20 85.3 kg    BMI:  Body mass index is 26.23 kg/m.  Estimated Nutritional Needs:   Kcal:  2100-2300  Protein:  105-115 grams  Fluid:  >/= 2 L/day  Corrin Parker, MS, RD, LDN RD pager number/after hours weekend pager number on Amion.

## 2020-03-08 ENCOUNTER — Telehealth: Payer: Self-pay | Admitting: Cardiology

## 2020-03-08 ENCOUNTER — Ambulatory Visit: Payer: Medicare Other

## 2020-03-08 LAB — CULTURE, BLOOD (ROUTINE X 2)
Culture: NO GROWTH
Culture: NO GROWTH
Special Requests: ADEQUATE
Special Requests: ADEQUATE

## 2020-03-08 LAB — URINE CULTURE: Culture: >=100000 — AB

## 2020-03-08 NOTE — Telephone Encounter (Signed)
Pt seen recently in hospital and all tests were negative.  His wife calls today, she believes he is very depressed he has been on prozac and continues.  She has a call in to psychiatrist, have asked her to touch base with Dr. Harrington Challenger.   Will review with DOD

## 2020-03-10 ENCOUNTER — Ambulatory Visit: Payer: Self-pay | Admitting: Cardiothoracic Surgery

## 2020-03-13 ENCOUNTER — Telehealth (HOSPITAL_COMMUNITY): Payer: Self-pay

## 2020-03-13 ENCOUNTER — Encounter (HOSPITAL_COMMUNITY): Payer: Self-pay

## 2020-03-13 NOTE — Telephone Encounter (Addendum)
Pt insurance is active and benefits verified through [employer] Co-pay 0, DED 0/0 met, out of pocket $4,000/$4,000 met, co-insurance 20%. no pre-authorization required. Passport, 03/13/2020'@2' :25pm, REF# [id number]  2ndary insurance is active and benefits verified through Medicare a/b. Co-pay 0, DED $203/$203 met, out of pocket 0/0 met, co-insurance 20%. No pre-authorization required. Passport, 03/13/2020'@2' :25pm, REF# [id number]   Will contact patient to see if he is interested in the Cardiac Rehab Program. If interested, patient will need to complete follow up appt. Once completed, patient will be contacted for scheduling upon review by the RN Navigator.

## 2020-03-17 ENCOUNTER — Ambulatory Visit: Payer: Self-pay | Admitting: Cardiothoracic Surgery

## 2020-03-23 ENCOUNTER — Other Ambulatory Visit: Payer: Self-pay | Admitting: Physician Assistant

## 2020-03-24 ENCOUNTER — Other Ambulatory Visit: Payer: Self-pay

## 2020-03-24 ENCOUNTER — Ambulatory Visit (INDEPENDENT_AMBULATORY_CARE_PROVIDER_SITE_OTHER): Payer: Self-pay | Admitting: Surgical

## 2020-03-24 VITALS — BP 150/70 | HR 64 | Temp 97.6°F | Resp 20 | Ht 71.0 in | Wt 183.0 lb

## 2020-03-24 DIAGNOSIS — I2510 Atherosclerotic heart disease of native coronary artery without angina pectoris: Secondary | ICD-10-CM

## 2020-03-24 DIAGNOSIS — Z951 Presence of aortocoronary bypass graft: Secondary | ICD-10-CM

## 2020-03-24 NOTE — Patient Instructions (Signed)
Routine activity progression 

## 2020-03-24 NOTE — Assessment & Plan Note (Signed)
No specific surgical issues

## 2020-03-24 NOTE — Progress Notes (Signed)
[address]       [place] [address]             [phone]      Keshon E Choi [place] Medical Record #[id number] Date of Birth: January 25, 1951  Referring: Jettie Booze, MD Primary Care: Lawerance Cruel, MD Primary Cardiologist: Larae Grooms, MD   Chief Complaint:   POST OP FOLLOW UP CARDIOTHORACIC SURGERY OPERATIVE NOTE  Date of Procedure:    01/31/20 Preoperative Diagnosis:      Severe 3-vessel Coronary Artery Disease  Postoperative Diagnosis:    Same  Procedure:        Coronary Artery Bypass Grafting x 5             Left Internal Mammary Artery to Distal Left Anterior Descending Coronary Artery; pedicled right internal mammary artery to distal right coronary Artery with right coronary artery endarterectomy; left radial artery graft to first and second obtuse Marginal Branches of Left Circumflex Coronary Artery and ramus intermedius vessel as a sequenced graft Bilateral internal mammary artery harvesting Open left radial artery harvesting Completion graft surveillance with indocyanine green fluorescence imaging  Surgeon:        B. Murvin Natal, MD  Assistant:       Leretha Pol, PA-C  Anesthesia:    General  Operative Findings: ? Preserved left ventricular systolic function ? Good quality internal mammary artery conduits ? Good quality radial artery conduit ? Good quality target vessels for grafting, except for right coronary artery    History of Present Illness:    Patient is a 69 year old male status post the above described procedure seen in the office on today's date for routine follow-up.  Overall he feels as though he is doing very well.  He has no specific complaints.  He denies chest pain or shortness of breath.  He was admitted to the hospital at the end of October for a short stay due to a apparent COPD exacerbation.  He denies lower extremity edema or palpitations.  He has not had any fevers, chills or other  significant constitutional symptoms.  His energy level and exercise tolerance is showing slow but steady improvement overall.  He has had no specific difficulties with his incisions.      Past Medical History:  Diagnosis Date  . Basal cell carcinoma of nose ~ 2012  . Carotid stenosis    a. Neck CTA 3/16:  mild bilat ICA calcific plaque (nothing > 50%)  . Coronary artery disease   . CVA (cerebral vascular accident) ([place]) 07/2014   "left hand and part of that arm weak since" (09/24/2014)  . Depression    "since stroke in 07/2014"   . Essential hypertension   . Headache   . HLD (hyperlipidemia)   . Hx of echocardiogram    a.  Echo 3/16:  mild LVH, EF 40-45%, Gr 1 DD, mild LAE;  b.  TEE 3/16:  EF 55%, mild LAE, no LAA clot, neg bubble study     Social History   Tobacco Use  Smoking Status Current Every Day Smoker  . Packs/day: 0.50  . Years: 46.00  . Pack years: 23.00  . Types: Cigarettes  Smokeless Tobacco Never Used  Tobacco Comment   Has rx for Chantix from Dr Gretta Cool    Social History   Substance and Sexual Activity  Alcohol Use Yes   Comment: 01/2017 Entered IOP for alcohol use disorder; Sober since 07/2016  Allergies  Allergen Reactions  . Shrimp [Shellfish Allergy] Other (See Comments)    "heart attack symptoms", "chest pain, numbness in arms" about 20 years ago    Current Outpatient Medications  Medication Sig Dispense Refill  . albuterol (VENTOLIN HFA) 108 (90 Base) MCG/ACT inhaler Inhale 2 puffs into the lungs every 6 (six) hours as needed for wheezing or shortness of breath. 8 g 0  . APPLE CIDER VINEGAR PO Take 1 capsule by mouth daily.    Marland Kitchen aspirin EC 81 MG EC tablet Take 1 tablet (81 mg total) by mouth daily. Swallow whole. 30 tablet 11  . atorvastatin (LIPITOR) 80 MG tablet Take 1 tablet (80 mg total) by mouth daily. 30 tablet 1  . clopidogrel (PLAVIX) 75 MG tablet Take 1 tablet (75 mg total) by mouth daily. 30 tablet 1  . FLUoxetine (PROZAC) 40 MG  capsule Take 40 mg by mouth 2 (two) times daily.    Marland Kitchen lisinopril (ZESTRIL) 2.5 MG tablet Take 1 tablet (2.5 mg total) by mouth daily. 30 tablet 1  . metoprolol tartrate (LOPRESSOR) 25 MG tablet Take 1 tablet (25 mg total) by mouth 2 (two) times daily. 60 tablet 1  . nicotine (NICODERM CQ - DOSED IN MG/24 HOURS) 21 mg/24hr patch Place 1 patch (21 mg total) onto the skin daily. 28 patch 0  . traMADol (ULTRAM) 50 MG tablet Take 1 tablet (50 mg total) by mouth every 4 (four) hours as needed for moderate pain. 30 tablet 0   No current facility-administered medications for this visit.       Physical Exam: BP (!) 150/70   Pulse 64   Temp 97.6 F (36.4 C) (Skin)   Resp 20   Ht 5\' 11"  (1.803 m)   Wt 183 lb (83 kg)   SpO2 96%   BMI 25.52 kg/m   General appearance: alert, cooperative and no distress Heart: regular rate and rhythm Lungs: clear to auscultation bilaterally Abdomen: benign exam Extremities: no edema Wound: well healed   Diagnostic Studies & Laboratory data:     Recent Radiology Findings:   No results found.    Recent Lab Findings: Lab Results  Component Value Date   WBC 8.9 03/04/2020   HGB 9.1 (L) 03/04/2020   HCT 28.2 (L) 03/04/2020   PLT 268 03/04/2020   GLUCOSE 98 03/04/2020   CHOL 110 12/29/2018   TRIG 84 12/29/2018   HDL 29 (L) 12/29/2018   LDLCALC 64 12/29/2018   ALT 15 03/04/2020   AST 23 03/04/2020   NA 134 (L) 03/04/2020   K 4.1 03/04/2020   CL 103 03/04/2020   CREATININE 0.87 03/04/2020   BUN 5 (L) 03/04/2020   CO2 22 03/04/2020   INR 1.4 (H) 01/31/2020   HGBA1C 5.6 01/30/2020      Assessment / Plan: Excellent recovery following his CABG.  I did not make any changes to his current medication regimen.  We will see him again on a as needed basis for any surgically related issues or at request.      Medication Changes: No orders of the defined types were placed in this encounter.     John Giovanni, PA-C 03/24/2020 2:27  PM

## 2020-03-26 ENCOUNTER — Encounter: Payer: Self-pay | Admitting: Physician Assistant
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
## 2020-03-27 ENCOUNTER — Ambulatory Visit: Payer: No Typology Code available for payment source | Admitting: Physician Assistant

## 2020-03-27 DIAGNOSIS — I2510 Atherosclerotic heart disease of native coronary artery without angina pectoris: Secondary | ICD-10-CM

## 2020-03-27 DIAGNOSIS — E785 Hyperlipidemia, unspecified: Secondary | ICD-10-CM

## 2020-03-27 DIAGNOSIS — I6523 Occlusion and stenosis of bilateral carotid arteries: Secondary | ICD-10-CM

## 2020-03-27 DIAGNOSIS — D649 Anemia, unspecified: Secondary | ICD-10-CM

## 2020-03-27 DIAGNOSIS — E876 Hypokalemia: Secondary | ICD-10-CM

## 2020-03-27 DIAGNOSIS — I10 Essential (primary) hypertension: Secondary | ICD-10-CM

## 2020-03-27 NOTE — Telephone Encounter (Signed)
No response from pt.  Closed referral  

## 2020-03-28 ENCOUNTER — Other Ambulatory Visit: Payer: Self-pay | Admitting: Interventional Cardiology

## 2020-03-28 ENCOUNTER — Other Ambulatory Visit: Payer: Self-pay | Admitting: Physician Assistant

## 2020-03-28 DIAGNOSIS — E785 Hyperlipidemia, unspecified: Secondary | ICD-10-CM

## 2020-04-27 DIAGNOSIS — Z23 Encounter for immunization: Secondary | ICD-10-CM | POA: Diagnosis not present

## 2020-06-01 ENCOUNTER — Emergency Department (HOSPITAL_BASED_OUTPATIENT_CLINIC_OR_DEPARTMENT_OTHER)
Admission: EM | Admit: 2020-06-01 | Discharge: 2020-06-01 | Disposition: A | Discharge status: Home / Self Care | Payer: No Typology Code available for payment source | Attending: Emergency Medicine | Admitting: Emergency Medicine

## 2020-06-01 ENCOUNTER — Other Ambulatory Visit: Payer: Self-pay

## 2020-06-01 ENCOUNTER — Encounter (HOSPITAL_BASED_OUTPATIENT_CLINIC_OR_DEPARTMENT_OTHER): Payer: Self-pay | Admitting: Emergency Medicine

## 2020-06-01 ENCOUNTER — Emergency Department (HOSPITAL_BASED_OUTPATIENT_CLINIC_OR_DEPARTMENT_OTHER): Payer: No Typology Code available for payment source

## 2020-06-01 DIAGNOSIS — I2510 Atherosclerotic heart disease of native coronary artery without angina pectoris: Secondary | ICD-10-CM | POA: Insufficient documentation

## 2020-06-01 DIAGNOSIS — Z79899 Other long term (current) drug therapy: Secondary | ICD-10-CM | POA: Insufficient documentation

## 2020-06-01 DIAGNOSIS — Z20822 Contact with and (suspected) exposure to covid-19: Secondary | ICD-10-CM

## 2020-06-01 DIAGNOSIS — U071 COVID-19: Secondary | ICD-10-CM | POA: Diagnosis not present

## 2020-06-01 DIAGNOSIS — F17210 Nicotine dependence, cigarettes, uncomplicated: Secondary | ICD-10-CM | POA: Insufficient documentation

## 2020-06-01 DIAGNOSIS — Z7982 Long term (current) use of aspirin: Secondary | ICD-10-CM | POA: Insufficient documentation

## 2020-06-01 DIAGNOSIS — J441 Chronic obstructive pulmonary disease with (acute) exacerbation: Secondary | ICD-10-CM

## 2020-06-01 DIAGNOSIS — R509 Fever, unspecified: Secondary | ICD-10-CM | POA: Diagnosis present

## 2020-06-01 DIAGNOSIS — Z7901 Long term (current) use of anticoagulants: Secondary | ICD-10-CM | POA: Insufficient documentation

## 2020-06-01 DIAGNOSIS — J449 Chronic obstructive pulmonary disease, unspecified: Secondary | ICD-10-CM | POA: Diagnosis not present

## 2020-06-01 DIAGNOSIS — I5032 Chronic diastolic (congestive) heart failure: Secondary | ICD-10-CM | POA: Insufficient documentation

## 2020-06-01 DIAGNOSIS — I110 Hypertensive heart disease with heart failure: Secondary | ICD-10-CM | POA: Diagnosis not present

## 2020-06-01 LAB — CBC
HCT: 29.6 % — ABNORMAL LOW (ref 39.0–52.0)
Hemoglobin: 9.6 g/dL — ABNORMAL LOW (ref 13.0–17.0)
MCH: 32.4 pg (ref 26.0–34.0)
MCHC: 32.4 g/dL (ref 30.0–36.0)
MCV: 69 fL — ABNORMAL LOW (ref 80.0–100.0)
Platelets: 274 10*3/uL (ref 150–400)
RBC: 4.29 MIL/uL (ref 4.22–5.81)
RDW: 17.8 % — ABNORMAL HIGH (ref 11.5–15.5)
WBC: 14.8 10*3/uL — ABNORMAL HIGH (ref 4.0–10.5)

## 2020-06-01 LAB — BASIC METABOLIC PANEL
Anion gap: 12 (ref 5–15)
BUN: 22 mg/dL (ref 8–23)
CO2: 22 mmol/L (ref 22–32)
Calcium: 8.5 mg/dL — ABNORMAL LOW (ref 8.9–10.3)
Chloride: 101 mmol/L (ref 98–111)
Creatinine, Ser: 1.2 mg/dL (ref 0.61–1.24)
GFR, Estimated: >60 mL/min (ref >60)
Glucose, Bld: 117 mg/dL — ABNORMAL HIGH (ref 70–99)
Potassium: 3.2 mmol/L — ABNORMAL LOW (ref 3.5–5.1)
Sodium: 135 mmol/L (ref 135–145)

## 2020-06-01 LAB — TROPONIN I (HIGH SENSITIVITY)
Troponin I (High Sensitivity): 19 ng/L — ABNORMAL HIGH (ref <18)
Troponin I (High Sensitivity): 21 ng/L — ABNORMAL HIGH (ref <18)

## 2020-06-01 MED ORDER — METHYLPREDNISOLONE SODIUM SUCC 125 MG IJ SOLR
125.0000 mg | Freq: Once | INTRAMUSCULAR | Status: AC
  Administered 2020-06-01: 125 mg via INTRAVENOUS
  Filled 2020-06-01: qty 2

## 2020-06-01 MED ORDER — ALBUTEROL SULFATE HFA 108 (90 BASE) MCG/ACT IN AERS
2.0000 | INHALATION_SPRAY | Freq: Once | RESPIRATORY_TRACT | Status: AC
  Administered 2020-06-01: 2 via RESPIRATORY_TRACT
  Filled 2020-06-01: qty 6.7

## 2020-06-01 MED ORDER — ACETAMINOPHEN 500 MG PO TABS
1000.0000 mg | ORAL_TABLET | Freq: Once | ORAL | Status: AC
  Administered 2020-06-01: 1000 mg via ORAL
  Filled 2020-06-01: qty 2

## 2020-06-01 MED ORDER — PREDNISONE 20 MG PO TABS: ORAL_TABLET | ORAL | 0 refills | Status: AC

## 2020-06-01 MED ORDER — SODIUM CHLORIDE 0.9 % IV BOLUS
1000.0000 mL | Freq: Once | INTRAVENOUS | Status: AC
  Administered 2020-06-01: 1000 mL via INTRAVENOUS

## 2020-06-01 NOTE — ED Triage Notes (Signed)
Pt arrives pov with family with c/o fever, fatigue and cough. Pt reports negative covid test x 2 weeks pta. Pt also endorses CP and shob. Endorses aleve at 1430 today

## 2020-06-01 NOTE — Discharge Instructions (Addendum)
Take prednisone as prescribed   Your heart enzyme tests are stable   Your COVID test is pending. If it is positive, you will qualify for antibody infusion   See your doctor for follow up   Return to ER if you have worse trouble breathing, shortness of breath, fever, vomiting

## 2020-06-01 NOTE — ED Notes (Signed)
RT at bedside.
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
## 2020-06-02 LAB — SARS CORONAVIRUS 2 (TAT 6-24 HRS): SARS Coronavirus 2: POSITIVE — AB

## 2020-06-03 ENCOUNTER — Telehealth: Payer: Self-pay

## 2020-06-03 DIAGNOSIS — U071 COVID-19: Secondary | ICD-10-CM

## 2020-06-03 NOTE — Telephone Encounter (Signed)
Patient recently diagnosed with COVID inquiring about monoclonal antibody infusion. Discussed with Dr. Irish Lack and we will put in referral. They have been made aware that they will only be called if eligible.
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
## 2020-06-27 ENCOUNTER — Ambulatory Visit: Payer: No Typology Code available for payment source | Admitting: Interventional Cardiology
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
## 2020-07-10 ENCOUNTER — Encounter: Payer: Self-pay | Admitting: Interventional Cardiology

## 2020-07-10 ENCOUNTER — Ambulatory Visit (INDEPENDENT_AMBULATORY_CARE_PROVIDER_SITE_OTHER): Payer: No Typology Code available for payment source | Admitting: Interventional Cardiology

## 2020-07-10 ENCOUNTER — Other Ambulatory Visit: Payer: Self-pay

## 2020-07-10 VITALS — BP 110/64 | HR 72 | Ht 71.0 in | Wt 190.2 lb

## 2020-07-10 DIAGNOSIS — I10 Essential (primary) hypertension: Secondary | ICD-10-CM

## 2020-07-10 DIAGNOSIS — R319 Hematuria, unspecified: Secondary | ICD-10-CM | POA: Diagnosis not present

## 2020-07-10 DIAGNOSIS — Z9861 Coronary angioplasty status: Secondary | ICD-10-CM

## 2020-07-10 DIAGNOSIS — R0789 Other chest pain: Secondary | ICD-10-CM

## 2020-07-10 DIAGNOSIS — Z720 Tobacco use: Secondary | ICD-10-CM

## 2020-07-10 DIAGNOSIS — E782 Mixed hyperlipidemia: Secondary | ICD-10-CM

## 2020-07-10 DIAGNOSIS — I2510 Atherosclerotic heart disease of native coronary artery without angina pectoris: Secondary | ICD-10-CM

## 2020-07-10 NOTE — Patient Instructions (Signed)
Medication Instructions:  Your physician recommends that you continue on your current medications as directed. Please refer to the Current Medication list given to you today. Hold aspirin for now.  *If you need a refill on your cardiac medications before your next appointment, please call your pharmacy*   Lab Work: Lab work to be done today--UA If you have labs (blood work) drawn today and your tests are completely normal, you will receive your results only by: Marland Kitchen MyChart Message (if you have MyChart) OR . A paper copy in the mail If you have any lab test that is abnormal or we need to change your treatment, we will call you to review the results.   Testing/Procedures: Non-Cardiac CT scanning, (CAT scanning), is a noninvasive, special x-ray that produces cross-sectional images of the body using x-rays and a computer. CT scans help physicians diagnose and treat medical conditions. For some CT exams, a contrast material is used to enhance visibility in the area of the body being studied. CT scans provide greater clarity and reveal more details than regular x-ray exams. To be done at [facility] in [place]: At [facility], you and your health needs are our priority.  As part of our continuing mission to provide you with exceptional heart care, we have created designated Provider Care Teams.  These Care Teams include your primary Cardiologist (physician) and Advanced Practice Providers (APPs -  Physician Assistants and Nurse Practitioners) who all work together to provide you with the care you need, when you need it.  We recommend signing up for the patient portal called "MyChart".  Sign up information is provided on this After Visit Summary.  MyChart is used to connect with patients for Virtual Visits (Telemedicine).  Patients are able to view lab/test results, encounter notes, upcoming appointments, etc.  Non-urgent messages can be sent to your provider as well.   To learn more  about what you can do with MyChart, go to [email or web address].    Your next appointment:   01/19/21 at 9:00 The format for your next appointment:   In Person  Provider:   Casandra Doffing, MD   Other Instructions

## 2020-07-11 ENCOUNTER — Ambulatory Visit: Payer: No Typology Code available for payment source | Admitting: Interventional Cardiology

## 2020-07-11 ENCOUNTER — Telehealth: Payer: Self-pay | Admitting: *Deleted

## 2020-07-11 LAB — URINALYSIS
Bilirubin, UA: NEGATIVE
Glucose, UA: NEGATIVE
Ketones, UA: NEGATIVE
Nitrite, UA: POSITIVE — AB
Protein,UA: NEGATIVE
RBC, UA: NEGATIVE
Specific Gravity, UA: 1.019 (ref 1.005–1.030)
Urobilinogen, Ur: 1 mg/dL (ref 0.2–1.0)
pH, UA: 5 (ref 5.0–7.5)

## 2020-07-11 MED ORDER — SULFAMETHOXAZOLE-TRIMETHOPRIM 800-160 MG PO TABS: 1.0000 | ORAL_TABLET | Freq: Two times a day (BID) | ORAL | 0 refills | Status: DC

## 2020-07-11 NOTE — Telephone Encounter (Signed)
Patient notified. Prescription sent to [facility] [employer] in [place].

## 2020-07-11 NOTE — Telephone Encounter (Signed)
-----   Message from Jettie Booze, MD sent at 07/11/2020  5:19 PM EST ----- Bactrim DS 1 tab PO BID x 7 days, disp 14

## 2020-07-18 ENCOUNTER — Ambulatory Visit (INDEPENDENT_AMBULATORY_CARE_PROVIDER_SITE_OTHER): Payer: No Typology Code available for payment source

## 2020-07-18 ENCOUNTER — Other Ambulatory Visit: Payer: Self-pay

## 2020-07-18 DIAGNOSIS — I2510 Atherosclerotic heart disease of native coronary artery without angina pectoris: Secondary | ICD-10-CM

## 2020-07-18 DIAGNOSIS — I708 Atherosclerosis of other arteries: Secondary | ICD-10-CM | POA: Diagnosis not present

## 2020-07-18 DIAGNOSIS — K449 Diaphragmatic hernia without obstruction or gangrene: Secondary | ICD-10-CM | POA: Diagnosis not present

## 2020-07-18 DIAGNOSIS — J984 Other disorders of lung: Secondary | ICD-10-CM | POA: Diagnosis not present

## 2020-07-18 DIAGNOSIS — R0789 Other chest pain: Secondary | ICD-10-CM

## 2020-07-21 ENCOUNTER — Telehealth: Payer: Self-pay | Admitting: Interventional Cardiology

## 2020-07-21 NOTE — Telephone Encounter (Signed)
Dr Irish Lack has reviewed and patient has been scheduled to see Dr Orvan Seen on 07/22/20

## 2020-07-21 NOTE — Telephone Encounter (Signed)
Daniel [name] is calling to give a CT call report. Please advise.

## 2020-07-21 NOTE — Telephone Encounter (Signed)
Received a call from [facility] Radiology regarding CT Chest from 07/18/20.  Radiologist wants to ensure Dr. Irish Lack reviews impression #1.  Message sent to his nurse with this information.

## 2020-07-22 ENCOUNTER — Other Ambulatory Visit: Payer: Self-pay

## 2020-07-22 ENCOUNTER — Other Ambulatory Visit: Payer: Self-pay | Admitting: *Deleted

## 2020-07-22 ENCOUNTER — Ambulatory Visit: Payer: No Typology Code available for payment source | Admitting: Medical-Surgical

## 2020-07-22 ENCOUNTER — Ambulatory Visit (INDEPENDENT_AMBULATORY_CARE_PROVIDER_SITE_OTHER): Payer: No Typology Code available for payment source | Admitting: Cardiothoracic Surgery

## 2020-07-22 VITALS — BP 149/72 | HR 79 | Resp 20 | Ht 71.0 in | Wt 191.0 lb

## 2020-07-22 DIAGNOSIS — Z1159 Encounter for screening for other viral diseases: Secondary | ICD-10-CM

## 2020-07-22 DIAGNOSIS — Z7689 Persons encountering health services in other specified circumstances: Secondary | ICD-10-CM

## 2020-07-22 DIAGNOSIS — Z951 Presence of aortocoronary bypass graft: Secondary | ICD-10-CM

## 2020-07-22 DIAGNOSIS — Z114 Encounter for screening for human immunodeficiency virus [HIV]: Secondary | ICD-10-CM

## 2020-07-22 DIAGNOSIS — T8132XA Disruption of internal operation (surgical) wound, not elsewhere classified, initial encounter: Secondary | ICD-10-CM

## 2020-07-22 DIAGNOSIS — R0789 Other chest pain: Secondary | ICD-10-CM | POA: Diagnosis not present
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
## 2020-07-22 NOTE — Progress Notes (Signed)
Pat,  Please set up echo per Dr. Orvan Seen.  Diagnosis : CAD  JV

## 2020-07-23 ENCOUNTER — Other Ambulatory Visit: Payer: Self-pay | Admitting: *Deleted

## 2020-07-23 DIAGNOSIS — Z951 Presence of aortocoronary bypass graft: Secondary | ICD-10-CM

## 2020-07-23 NOTE — Pre-Procedure Instructions (Signed)
Daniel [name]  07/23/2020    Your procedure is scheduled on Fri., July 25, 2020 from 7:30AM-10:50AM  Report to [facility] Entrance "A" at 5:30AM  Call this number if you have problems the morning of surgery:  [phone]   Remember:  Do not eat or drink after midnight on March 17th    Take these medicines the morning of surgery with A SIP OF WATER: Atorvastatin (LIPITOR) FLUoxetine (PROZAC) Metoprolol tartrate (LOPRESSOR)  If Needed: TraMADol (ULTRAM)  Albuterol (VENTOLIN HFA) Inhaler- bring day of surgery  Follow your surgeon's instructions on when to stop Plavix.  If no instructions were given by your surgeon then you will need to call the office to get those instructions.    As of today, STOP taking all Aspirin (Unless instructed by your doctor) and Other Aspirin containing products, Vitamins, Fish oils, and Herbal medications. Also stop all NSAIDS i.e. Advil, Ibuprofen, Motrin, Aleve, Anaprox, Naproxen, BC, Goody Powders, and all Supplements.  No Smoking of any kind, Tobacco/Vaping, or Alcohol products 24 hours prior to your procedure. If you use a Cpap at night, you may bring all equipment for your overnight stay.    Day of Surgery:  Do not wear jewelry.  Do not wear lotions, powders, colognes, or deodorant.  Do not shave 48 hours prior to surgery.  Men may shave face and neck.  Do not bring valuables to the hospital.  [facility] is not responsible for any belongings or valuables.  Contacts, dentures or bridgework may not be worn into surgery.    For patients admitted to the hospital, discharge time will be determined by your treatment team.  Patients discharged the day of surgery will not be allowed to drive home, and someone age 45 and over needs to stay with them for 24 hours.    Special instructions:  [place]- Preparing For Surgery  Before surgery, you can play an important role. Because skin is not sterile, your skin needs to be as free of  germs as possible. You can reduce the number of germs on your skin by washing with CHG (chlorahexidine gluconate) Soap before surgery.  CHG is an antiseptic cleaner which kills germs and bonds with the skin to continue killing germs even after washing.    Oral Hygiene is also important to reduce your risk of infection.  Remember - BRUSH YOUR TEETH THE MORNING OF SURGERY WITH YOUR REGULAR TOOTHPASTE  Please do not use if you have an allergy to CHG or antibacterial soaps. If your skin becomes reddened/irritated stop using the CHG.  Do not shave (including legs and underarms) for at least 48 hours prior to first CHG shower. It is OK to shave your face.  Please follow these instructions carefully.   1. Shower the NIGHT BEFORE SURGERY and the MORNING OF SURGERY with CHG.   2. If you chose to wash your hair, wash your hair first as usual with your normal shampoo.  3. After you shampoo, rinse your hair and body thoroughly to remove the shampoo.  4. Use CHG as you would any other liquid soap. You can apply CHG directly to the skin and wash gently with a scrungie or a clean washcloth.   5. Apply the CHG Soap to your body ONLY FROM THE NECK DOWN.  Do not use on open wounds or open sores. Avoid contact with your eyes, ears, mouth and genitals (private parts). Wash Face and genitals (private parts)  with your normal soap.  6. Wash thoroughly, paying special attention to the area where your surgery will be performed.  7. Thoroughly rinse your body with warm water from the neck down.  8. DO NOT shower/wash with your normal soap after using and rinsing off the CHG Soap.  9. Pat yourself dry with a CLEAN TOWEL.  10. Wear CLEAN PAJAMAS to bed the night before surgery, wear comfortable clothes the morning of surgery  11. Place CLEAN SHEETS on your bed the night of your first shower and DO NOT SLEEP WITH PETS.  Reminders: Do not apply any deodorants/lotions.  Please wear clean clothes to the  hospital/surgery center.   Remember to brush your teeth WITH YOUR REGULAR TOOTHPASTE.  Please read over the following fact sheets that you were given.

## 2020-07-24 ENCOUNTER — Encounter (HOSPITAL_COMMUNITY): Payer: Self-pay

## 2020-07-24 ENCOUNTER — Encounter (HOSPITAL_COMMUNITY)
Admission: RE | Admit: 2020-07-24 | Discharge: 2020-07-24 | Disposition: A | Discharge status: Home / Self Care | Payer: No Typology Code available for payment source | Source: Ambulatory Visit | Attending: Cardiothoracic Surgery | Admitting: Cardiothoracic Surgery

## 2020-07-24 ENCOUNTER — Other Ambulatory Visit: Payer: Self-pay

## 2020-07-24 ENCOUNTER — Ambulatory Visit (HOSPITAL_BASED_OUTPATIENT_CLINIC_OR_DEPARTMENT_OTHER)
Admission: RE | Admit: 2020-07-24 | Discharge: 2020-07-24 | Disposition: A | Discharge status: Home / Self Care | Payer: No Typology Code available for payment source | Source: Ambulatory Visit | Attending: Cardiothoracic Surgery | Admitting: Cardiothoracic Surgery

## 2020-07-24 DIAGNOSIS — Z01818 Encounter for other preprocedural examination: Secondary | ICD-10-CM | POA: Insufficient documentation

## 2020-07-24 DIAGNOSIS — Z8673 Personal history of transient ischemic attack (TIA), and cerebral infarction without residual deficits: Secondary | ICD-10-CM | POA: Diagnosis not present

## 2020-07-24 DIAGNOSIS — I10 Essential (primary) hypertension: Secondary | ICD-10-CM | POA: Insufficient documentation

## 2020-07-24 DIAGNOSIS — T8131XA Disruption of external operation (surgical) wound, not elsewhere classified, initial encounter: Secondary | ICD-10-CM | POA: Diagnosis not present

## 2020-07-24 DIAGNOSIS — G43909 Migraine, unspecified, not intractable, without status migrainosus: Secondary | ICD-10-CM | POA: Diagnosis present

## 2020-07-24 DIAGNOSIS — I110 Hypertensive heart disease with heart failure: Secondary | ICD-10-CM | POA: Diagnosis not present

## 2020-07-24 DIAGNOSIS — J90 Pleural effusion, not elsewhere classified: Secondary | ICD-10-CM | POA: Diagnosis not present

## 2020-07-24 DIAGNOSIS — I25110 Atherosclerotic heart disease of native coronary artery with unstable angina pectoris: Secondary | ICD-10-CM | POA: Diagnosis not present

## 2020-07-24 DIAGNOSIS — I4891 Unspecified atrial fibrillation: Secondary | ICD-10-CM | POA: Diagnosis present

## 2020-07-24 DIAGNOSIS — Z85828 Personal history of other malignant neoplasm of skin: Secondary | ICD-10-CM | POA: Diagnosis not present

## 2020-07-24 DIAGNOSIS — Z79899 Other long term (current) drug therapy: Secondary | ICD-10-CM | POA: Insufficient documentation

## 2020-07-24 DIAGNOSIS — F419 Anxiety disorder, unspecified: Secondary | ICD-10-CM | POA: Diagnosis present

## 2020-07-24 DIAGNOSIS — Z20822 Contact with and (suspected) exposure to covid-19: Secondary | ICD-10-CM | POA: Diagnosis present

## 2020-07-24 DIAGNOSIS — Z86711 Personal history of pulmonary embolism: Secondary | ICD-10-CM | POA: Insufficient documentation

## 2020-07-24 DIAGNOSIS — T8132XA Disruption of internal operation (surgical) wound, not elsewhere classified, initial encounter: Secondary | ICD-10-CM | POA: Insufficient documentation

## 2020-07-24 DIAGNOSIS — J984 Other disorders of lung: Secondary | ICD-10-CM | POA: Diagnosis not present

## 2020-07-24 DIAGNOSIS — Z0189 Encounter for other specified special examinations: Secondary | ICD-10-CM | POA: Diagnosis not present

## 2020-07-24 DIAGNOSIS — I2510 Atherosclerotic heart disease of native coronary artery without angina pectoris: Secondary | ICD-10-CM | POA: Insufficient documentation

## 2020-07-24 DIAGNOSIS — J449 Chronic obstructive pulmonary disease, unspecified: Secondary | ICD-10-CM | POA: Insufficient documentation

## 2020-07-24 DIAGNOSIS — S21109A Unspecified open wound of unspecified front wall of thorax without penetration into thoracic cavity, initial encounter: Secondary | ICD-10-CM | POA: Diagnosis not present

## 2020-07-24 DIAGNOSIS — I700 Atherosclerosis of aorta: Secondary | ICD-10-CM | POA: Diagnosis not present

## 2020-07-24 DIAGNOSIS — E785 Hyperlipidemia, unspecified: Secondary | ICD-10-CM | POA: Diagnosis not present

## 2020-07-24 DIAGNOSIS — Y838 Other surgical procedures as the cause of abnormal reaction of the patient, or of later complication, without mention of misadventure at the time of the procedure: Secondary | ICD-10-CM | POA: Diagnosis present

## 2020-07-24 DIAGNOSIS — Z951 Presence of aortocoronary bypass graft: Secondary | ICD-10-CM

## 2020-07-24 DIAGNOSIS — Z86718 Personal history of other venous thrombosis and embolism: Secondary | ICD-10-CM | POA: Insufficient documentation

## 2020-07-24 DIAGNOSIS — X58XXXA Exposure to other specified factors, initial encounter: Secondary | ICD-10-CM | POA: Insufficient documentation

## 2020-07-24 DIAGNOSIS — M868X8 Other osteomyelitis, other site: Secondary | ICD-10-CM | POA: Diagnosis present

## 2020-07-24 DIAGNOSIS — Z91013 Allergy to seafood: Secondary | ICD-10-CM | POA: Diagnosis not present

## 2020-07-24 DIAGNOSIS — D649 Anemia, unspecified: Secondary | ICD-10-CM | POA: Diagnosis present

## 2020-07-24 DIAGNOSIS — Z955 Presence of coronary angioplasty implant and graft: Secondary | ICD-10-CM | POA: Diagnosis not present

## 2020-07-24 DIAGNOSIS — Z7982 Long term (current) use of aspirin: Secondary | ICD-10-CM | POA: Insufficient documentation

## 2020-07-24 DIAGNOSIS — Z8616 Personal history of COVID-19: Secondary | ICD-10-CM | POA: Diagnosis not present

## 2020-07-24 DIAGNOSIS — T8130XA Disruption of wound, unspecified, initial encounter: Secondary | ICD-10-CM | POA: Diagnosis not present

## 2020-07-24 DIAGNOSIS — A498 Other bacterial infections of unspecified site: Secondary | ICD-10-CM | POA: Diagnosis not present

## 2020-07-24 DIAGNOSIS — Z95828 Presence of other vascular implants and grafts: Secondary | ICD-10-CM | POA: Diagnosis not present

## 2020-07-24 DIAGNOSIS — I5032 Chronic diastolic (congestive) heart failure: Secondary | ICD-10-CM | POA: Diagnosis not present

## 2020-07-24 DIAGNOSIS — I493 Ventricular premature depolarization: Secondary | ICD-10-CM | POA: Diagnosis present

## 2020-07-24 DIAGNOSIS — J9811 Atelectasis: Secondary | ICD-10-CM | POA: Diagnosis not present

## 2020-07-24 DIAGNOSIS — M869 Osteomyelitis, unspecified: Secondary | ICD-10-CM | POA: Diagnosis not present

## 2020-07-24 DIAGNOSIS — Z9889 Other specified postprocedural states: Secondary | ICD-10-CM | POA: Diagnosis not present

## 2020-07-24 HISTORY — DX: Dyspnea, unspecified: R06.00

## 2020-07-24 HISTORY — DX: Other specified postprocedural states: Z98.890

## 2020-07-24 LAB — ECHOCARDIOGRAM COMPLETE
Area-P 1/2: 2.82 cm2
Height: 71 in
S' Lateral: 3.3 cm
Weight: 3074 oz

## 2020-07-24 LAB — TYPE AND SCREEN
ABO/RH(D): O POS
Antibody Screen: NEGATIVE

## 2020-07-24 LAB — SURGICAL PCR SCREEN
MRSA, PCR: NEGATIVE
Staphylococcus aureus: NEGATIVE

## 2020-07-24 LAB — COMPREHENSIVE METABOLIC PANEL
ALT: 19 U/L (ref 0–44)
AST: 22 U/L (ref 15–41)
Albumin: 3.9 g/dL (ref 3.5–5.0)
Alkaline Phosphatase: 85 U/L (ref 38–126)
Anion gap: 10 (ref 5–15)
BUN: 13 mg/dL (ref 8–23)
CO2: 20 mmol/L — ABNORMAL LOW (ref 22–32)
Calcium: 9.2 mg/dL (ref 8.9–10.3)
Chloride: 107 mmol/L (ref 98–111)
Creatinine, Ser: 0.78 mg/dL (ref 0.61–1.24)
GFR, Estimated: >60 mL/min (ref >60)
Glucose, Bld: 93 mg/dL (ref 70–99)
Potassium: 4.3 mmol/L (ref 3.5–5.1)
Sodium: 137 mmol/L (ref 135–145)
Total Bilirubin: 1 mg/dL (ref 0.3–1.2)
Total Protein: 6.5 g/dL (ref 6.5–8.1)

## 2020-07-24 LAB — PROTIME-INR
INR: 1 (ref 0.8–1.2)
Prothrombin Time: 12.8 seconds (ref 11.4–15.2)

## 2020-07-24 LAB — CBC
HCT: 36.4 % — ABNORMAL LOW (ref 39.0–52.0)
Hemoglobin: 11.7 g/dL — ABNORMAL LOW (ref 13.0–17.0)
MCH: 23.6 pg — ABNORMAL LOW (ref 26.0–34.0)
MCHC: 32.1 g/dL (ref 30.0–36.0)
MCV: 73.5 fL — ABNORMAL LOW (ref 80.0–100.0)
Platelets: 252 10*3/uL (ref 150–400)
RBC: 4.95 MIL/uL (ref 4.22–5.81)
RDW: 21 % — ABNORMAL HIGH (ref 11.5–15.5)
WBC: 7 10*3/uL (ref 4.0–10.5)
nRBC: 0 % (ref 0.0–0.2)

## 2020-07-24 LAB — BLOOD GAS, ARTERIAL
Acid-base deficit: 0.9 mmol/L (ref 0.0–2.0)
Bicarbonate: 23.4 mmol/L (ref 20.0–28.0)
FIO2: 21
O2 Saturation: 97 %
Patient temperature: 37
pCO2 arterial: 39.4 mmHg (ref 32.0–48.0)
pH, Arterial: 7.391 (ref 7.350–7.450)
pO2, Arterial: 89.9 mmHg (ref 83.0–108.0)

## 2020-07-24 LAB — URINALYSIS, ROUTINE W REFLEX MICROSCOPIC
Bilirubin Urine: NEGATIVE
Glucose, UA: NEGATIVE mg/dL
Hgb urine dipstick: NEGATIVE
Ketones, ur: NEGATIVE mg/dL
Leukocytes,Ua: NEGATIVE
Nitrite: NEGATIVE
Protein, ur: NEGATIVE mg/dL
Specific Gravity, Urine: 1.017 (ref 1.005–1.030)
pH: 5 (ref 5.0–8.0)

## 2020-07-24 LAB — SARS CORONAVIRUS 2 (TAT 6-24 HRS): SARS Coronavirus 2: NEGATIVE

## 2020-07-24 LAB — APTT: aPTT: 34 seconds (ref 24–36)
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
## 2020-07-24 NOTE — Progress Notes (Signed)
PCP - Dr. Lona Kettle Cardiologist - Dr. Irish Lack  Pt states he has a loop recorder in place.  Chest x-ray - 07/24/20 EKG - 07/24/20 Stress Test - 12/29/18 ECHO - 07/24/20 Cardiac Cath - 01/31/20  Sleep Study - denies CPAP - denies  Blood Thinner Instructions: Brillinta; LD 07/22/20 Aspirin Instructions: LD 07/22/20  COVID TEST- 07/24/20 in PAT   Anesthesia review: Yes, heart history.  Patient denies shortness of breath, fever, cough and chest pain at PAT appointment   All instructions explained to the patient, with a verbal understanding of the material. Patient agrees to go over the instructions while at home for a better understanding. Patient also instructed to self quarantine after being tested for COVID-19. The opportunity to ask questions was provided.

## 2020-07-24 NOTE — Progress Notes (Signed)
  Echocardiogram 2D Echocardiogram has been performed.  Daniel [name] F 07/24/2020, 12:59 PM

## 2020-07-24 NOTE — Pre-Procedure Instructions (Signed)
Daniel [name]  07/24/2020    Your procedure is scheduled on Fri., July 25, 2020 from 7:30AM-10:50AM  Report to [facility] Entrance "A" at 5:30AM  Call this number if you have problems the morning of surgery:  [phone]   Remember:  Do not eat or drink after midnight on March 17th    Take these medicines the morning of surgery with A SIP OF WATER: Atorvastatin (LIPITOR) Metoprolol tartrate (LOPRESSOR)  If Needed: Albuterol (VENTOLIN HFA) Inhaler- bring day of surgery Nitroglycerin-as needed for chest pain. Please let the nurse know if you had to use this.   Follow your surgeon's instructions on when to stop/resume Aspirin and Brillinta.  If no instructions were given by your surgeon then you will need to call the office to get those instructions.    As of today, STOP taking all Aspirin containing products, Vitamins, Fish oils, and Herbal medications. Also stop all NSAIDS i.e. Advil, Ibuprofen, Motrin, Aleve, Anaprox, Naproxen, BC, Goody Powders, and all Supplements.  No Smoking of any kind, Tobacco/Vaping, or Alcohol products 24 hours prior to your procedure. If you use a Cpap at night, you may bring all equipment for your overnight stay.    Day of Surgery:  Do not wear jewelry.  Do not wear lotions, powders, colognes, or deodorant.  Do not shave 48 hours prior to surgery.  Men may shave face and neck.  Do not bring valuables to the hospital.  [facility] is not responsible for any belongings or valuables.  Contacts, dentures or bridgework may not be worn into surgery.    For patients admitted to the hospital, discharge time will be determined by your treatment team.  Patients discharged the day of surgery will not be allowed to drive home, and someone age 69 and over needs to stay with them for 24 hours.    Special instructions:  [facility]- Preparing For Surgery  Before surgery, you can play an important role. Because skin is not sterile, your skin needs  to be as free of germs as possible. You can reduce the number of germs on your skin by washing with CHG (chlorahexidine gluconate) Soap before surgery.  CHG is an antiseptic cleaner which kills germs and bonds with the skin to continue killing germs even after washing.    Oral Hygiene is also important to reduce your risk of infection.  Remember - BRUSH YOUR TEETH THE MORNING OF SURGERY WITH YOUR REGULAR TOOTHPASTE  Please do not use if you have an allergy to CHG or antibacterial soaps. If your skin becomes reddened/irritated stop using the CHG.  Do not shave (including legs and underarms) for at least 48 hours prior to first CHG shower. It is OK to shave your face.  Please follow these instructions carefully.   1. Shower the NIGHT BEFORE SURGERY and the MORNING OF SURGERY with CHG.   2. If you chose to wash your hair, wash your hair first as usual with your normal shampoo.  3. After you shampoo, rinse your hair and body thoroughly to remove the shampoo.  4. Use CHG as you would any other liquid soap. You can apply CHG directly to the skin and wash gently with a scrungie or a clean washcloth.   5. Apply the CHG Soap to your body ONLY FROM THE NECK DOWN.  Do not use on open wounds or open sores. Avoid contact with your eyes, ears, mouth and genitals (private parts). Wash Face and genitals (private parts)  with your normal soap.  6. Wash thoroughly, paying special attention to the area where your surgery will be performed.  7. Thoroughly rinse your body with warm water from the neck down.  8. DO NOT shower/wash with your normal soap after using and rinsing off the CHG Soap.  9. Pat yourself dry with a CLEAN TOWEL.  10. Wear CLEAN PAJAMAS to bed the night before surgery, wear comfortable clothes the morning of surgery  11. Place CLEAN SHEETS on your bed the night of your first shower and DO NOT SLEEP WITH PETS.  Reminders: Do not apply any deodorants/lotions.  Please wear clean clothes  to the hospital/surgery center.   Remember to brush your teeth WITH YOUR REGULAR TOOTHPASTE.  Please read over the following fact sheets that you were given.

## 2020-07-24 NOTE — Anesthesia Preprocedure Evaluation (Signed)
Anesthesia Evaluation  Patient identified by MRN, date of birth, ID band Patient awake    Reviewed: Allergy & Precautions, NPO status , Patient's Chart, lab work & pertinent test results  Airway Mallampati: II  TM Distance: >3 FB Neck ROM: Full    Dental  (+) Teeth Intact, Dental Advisory Given   Pulmonary COPD,  COPD inhaler, former smoker,    breath sounds clear to auscultation       Cardiovascular hypertension, Pt. on medications + CAD, + Cardiac Stents, + CABG and +CHF   Rhythm:Regular Rate:Normal     Neuro/Psych  Headaches, PSYCHIATRIC DISORDERS Anxiety Depression CVA, Residual Symptoms    GI/Hepatic negative GI ROS, Neg liver ROS,   Endo/Other  negative endocrine ROS  Renal/GU negative Renal ROS     Musculoskeletal negative musculoskeletal ROS (+)   Abdominal Normal abdominal exam  (+)   Peds  Hematology negative hematology ROS (+)   Anesthesia Other Findings   Reproductive/Obstetrics                           Anesthesia Physical Anesthesia Plan  ASA: III  Anesthesia Plan: General   Post-op Pain Management:    Induction: Intravenous  PONV Risk Score and Plan: 3 and Ondansetron, Dexamethasone and Midazolam  Airway Management Planned: Oral ETT  Additional Equipment: None  Intra-op Plan:   Post-operative Plan: Extubation in OR  Informed Consent: I have reviewed the patients History and Physical, chart, labs and discussed the procedure including the risks, benefits and alternatives for the proposed anesthesia with the patient or authorized representative who has indicated his/her understanding and acceptance.     Dental advisory given  Plan Discussed with: CRNA  Anesthesia Plan Comments: (See APP note by Durel Salts, FNP   2 Large IV's)      Anesthesia Quick Evaluation

## 2020-07-25 ENCOUNTER — Encounter (HOSPITAL_COMMUNITY)
Admission: RE | Disposition: A | Discharge status: Home Health | Payer: Self-pay | Source: Home / Self Care | Attending: Cardiothoracic Surgery

## 2020-07-25 ENCOUNTER — Inpatient Hospital Stay (HOSPITAL_COMMUNITY): Payer: No Typology Code available for payment source | Admitting: Certified Registered Nurse Anesthetist

## 2020-07-25 ENCOUNTER — Other Ambulatory Visit: Payer: Self-pay

## 2020-07-25 ENCOUNTER — Encounter (HOSPITAL_COMMUNITY): Payer: Self-pay | Admitting: Cardiothoracic Surgery

## 2020-07-25 ENCOUNTER — Inpatient Hospital Stay (HOSPITAL_COMMUNITY)
Admission: RE | Admit: 2020-07-25 | Discharge: 2020-08-03 | DRG: 908 | Disposition: A | Discharge status: Home Health | Payer: No Typology Code available for payment source | Attending: Cardiothoracic Surgery | Admitting: Cardiothoracic Surgery

## 2020-07-25 DIAGNOSIS — I493 Ventricular premature depolarization: Secondary | ICD-10-CM | POA: Diagnosis present

## 2020-07-25 DIAGNOSIS — Z8616 Personal history of COVID-19: Secondary | ICD-10-CM | POA: Diagnosis not present

## 2020-07-25 DIAGNOSIS — Z9889 Other specified postprocedural states: Secondary | ICD-10-CM | POA: Diagnosis not present

## 2020-07-25 DIAGNOSIS — Z8673 Personal history of transient ischemic attack (TIA), and cerebral infarction without residual deficits: Secondary | ICD-10-CM | POA: Diagnosis not present

## 2020-07-25 DIAGNOSIS — Y838 Other surgical procedures as the cause of abnormal reaction of the patient, or of later complication, without mention of misadventure at the time of the procedure: Secondary | ICD-10-CM | POA: Diagnosis present

## 2020-07-25 DIAGNOSIS — Z951 Presence of aortocoronary bypass graft: Secondary | ICD-10-CM | POA: Diagnosis not present

## 2020-07-25 DIAGNOSIS — D649 Anemia, unspecified: Secondary | ICD-10-CM | POA: Diagnosis present

## 2020-07-25 DIAGNOSIS — I4891 Unspecified atrial fibrillation: Secondary | ICD-10-CM | POA: Diagnosis present

## 2020-07-25 DIAGNOSIS — T84218A Breakdown (mechanical) of internal fixation device of other bones, initial encounter: Secondary | ICD-10-CM

## 2020-07-25 DIAGNOSIS — I10 Essential (primary) hypertension: Secondary | ICD-10-CM | POA: Diagnosis present

## 2020-07-25 DIAGNOSIS — J984 Other disorders of lung: Secondary | ICD-10-CM | POA: Diagnosis not present

## 2020-07-25 DIAGNOSIS — Z20822 Contact with and (suspected) exposure to covid-19: Secondary | ICD-10-CM | POA: Diagnosis present

## 2020-07-25 DIAGNOSIS — Z91013 Allergy to seafood: Secondary | ICD-10-CM

## 2020-07-25 DIAGNOSIS — Z95828 Presence of other vascular implants and grafts: Secondary | ICD-10-CM | POA: Diagnosis not present

## 2020-07-25 DIAGNOSIS — F419 Anxiety disorder, unspecified: Secondary | ICD-10-CM | POA: Diagnosis present

## 2020-07-25 DIAGNOSIS — M868X8 Other osteomyelitis, other site: Secondary | ICD-10-CM | POA: Diagnosis present

## 2020-07-25 DIAGNOSIS — I700 Atherosclerosis of aorta: Secondary | ICD-10-CM | POA: Diagnosis not present

## 2020-07-25 DIAGNOSIS — Z85828 Personal history of other malignant neoplasm of skin: Secondary | ICD-10-CM | POA: Diagnosis not present

## 2020-07-25 DIAGNOSIS — T8131XA Disruption of external operation (surgical) wound, not elsewhere classified, initial encounter: Secondary | ICD-10-CM | POA: Diagnosis present

## 2020-07-25 DIAGNOSIS — M869 Osteomyelitis, unspecified: Secondary | ICD-10-CM | POA: Diagnosis not present

## 2020-07-25 DIAGNOSIS — A498 Other bacterial infections of unspecified site: Secondary | ICD-10-CM | POA: Diagnosis not present

## 2020-07-25 DIAGNOSIS — Z955 Presence of coronary angioplasty implant and graft: Secondary | ICD-10-CM

## 2020-07-25 DIAGNOSIS — T8132XA Disruption of internal operation (surgical) wound, not elsewhere classified, initial encounter: Secondary | ICD-10-CM | POA: Diagnosis not present

## 2020-07-25 DIAGNOSIS — G43909 Migraine, unspecified, not intractable, without status migrainosus: Secondary | ICD-10-CM | POA: Diagnosis present

## 2020-07-25 DIAGNOSIS — J90 Pleural effusion, not elsewhere classified: Secondary | ICD-10-CM | POA: Diagnosis not present

## 2020-07-25 DIAGNOSIS — Z09 Encounter for follow-up examination after completed treatment for conditions other than malignant neoplasm: Secondary | ICD-10-CM

## 2020-07-25 DIAGNOSIS — T8130XA Disruption of wound, unspecified, initial encounter: Secondary | ICD-10-CM | POA: Diagnosis not present

## 2020-07-25 DIAGNOSIS — J449 Chronic obstructive pulmonary disease, unspecified: Secondary | ICD-10-CM | POA: Diagnosis present

## 2020-07-25 DIAGNOSIS — E785 Hyperlipidemia, unspecified: Secondary | ICD-10-CM | POA: Diagnosis present

## 2020-07-25 DIAGNOSIS — I5032 Chronic diastolic (congestive) heart failure: Secondary | ICD-10-CM | POA: Diagnosis present

## 2020-07-25 DIAGNOSIS — J9811 Atelectasis: Secondary | ICD-10-CM | POA: Diagnosis not present

## 2020-07-25 DIAGNOSIS — S21109A Unspecified open wound of unspecified front wall of thorax without penetration into thoracic cavity, initial encounter: Secondary | ICD-10-CM | POA: Diagnosis not present

## 2020-07-25 DIAGNOSIS — I25110 Atherosclerotic heart disease of native coronary artery with unstable angina pectoris: Secondary | ICD-10-CM | POA: Diagnosis not present

## 2020-07-25 DIAGNOSIS — T84218S Breakdown (mechanical) of internal fixation device of other bones, sequela: Secondary | ICD-10-CM

## 2020-07-25 DIAGNOSIS — I2510 Atherosclerotic heart disease of native coronary artery without angina pectoris: Secondary | ICD-10-CM | POA: Diagnosis present

## 2020-07-25 DIAGNOSIS — T81328A Disruption or dehiscence of closure of other specified internal operation (surgical) wound, initial encounter: Secondary | ICD-10-CM | POA: Diagnosis present

## 2020-07-25 DIAGNOSIS — I110 Hypertensive heart disease with heart failure: Secondary | ICD-10-CM | POA: Diagnosis not present

## 2020-07-25 HISTORY — PX: STERNAL WOUND DEBRIDEMENT: SHX1058

## 2020-07-25 LAB — URINE CULTURE: Culture: NO GROWTH

## 2020-07-25 SURGERY — DEBRIDEMENT, WOUND, STERNUM
Anesthesia: General

## 2020-07-25 MED ORDER — DEXAMETHASONE SODIUM PHOSPHATE 10 MG/ML IJ SOLN
INTRAMUSCULAR | Status: DC | PRN
  Administered 2020-07-25: 5 mg via INTRAVENOUS

## 2020-07-25 MED ORDER — ACETAMINOPHEN 10 MG/ML IV SOLN
1000.0000 mg | Freq: Once | INTRAVENOUS | Status: DC | PRN
  Administered 2020-07-25: 1000 mg via INTRAVENOUS

## 2020-07-25 MED ORDER — FENTANYL CITRATE (PF) 250 MCG/5ML IJ SOLN
INTRAMUSCULAR | Status: AC
  Filled 2020-07-25: qty 5

## 2020-07-25 MED ORDER — CEFAZOLIN SODIUM-DEXTROSE 2-4 GM/100ML-% IV SOLN
INTRAVENOUS | Status: AC
  Filled 2020-07-25: qty 100

## 2020-07-25 MED ORDER — PROPOFOL 10 MG/ML IV BOLUS
INTRAVENOUS | Status: DC | PRN
  Administered 2020-07-25: 130 mg via INTRAVENOUS

## 2020-07-25 MED ORDER — METOPROLOL TARTRATE 25 MG PO TABS
25.0000 mg | ORAL_TABLET | Freq: Two times a day (BID) | ORAL | Status: DC
  Administered 2020-07-25 – 2020-08-03 (×18): 25 mg via ORAL
  Filled 2020-07-25 (×18): qty 1

## 2020-07-25 MED ORDER — HYDROMORPHONE HCL 1 MG/ML IJ SOLN
0.2500 mg | INTRAMUSCULAR | Status: DC | PRN
  Administered 2020-07-25 (×3): 0.5 mg via INTRAVENOUS

## 2020-07-25 MED ORDER — ENOXAPARIN SODIUM 40 MG/0.4ML ~~LOC~~ SOLN
40.0000 mg | SUBCUTANEOUS | Status: DC
  Administered 2020-07-26 – 2020-08-03 (×8): 40 mg via SUBCUTANEOUS
  Filled 2020-07-25 (×8): qty 0.4

## 2020-07-25 MED ORDER — FENTANYL CITRATE (PF) 100 MCG/2ML IJ SOLN
INTRAMUSCULAR | Status: DC | PRN
  Administered 2020-07-25: 50 ug via INTRAVENOUS
  Administered 2020-07-25 (×2): 100 ug via INTRAVENOUS

## 2020-07-25 MED ORDER — HEMOSTATIC AGENTS (NO CHARGE) OPTIME
TOPICAL | Status: DC | PRN
  Administered 2020-07-25: 1 via TOPICAL

## 2020-07-25 MED ORDER — ONDANSETRON HCL 4 MG/2ML IJ SOLN
4.0000 mg | Freq: Four times a day (QID) | INTRAMUSCULAR | Status: DC | PRN
  Administered 2020-07-25 – 2020-07-27 (×2): 4 mg via INTRAVENOUS
  Filled 2020-07-25 (×2): qty 2

## 2020-07-25 MED ORDER — ONDANSETRON HCL 4 MG/2ML IJ SOLN
INTRAMUSCULAR | Status: AC
  Filled 2020-07-25: qty 2

## 2020-07-25 MED ORDER — TRAMADOL HCL 50 MG PO TABS
50.0000 mg | ORAL_TABLET | Freq: Four times a day (QID) | ORAL | Status: DC | PRN
Start: 2020-07-25 — End: 2020-08-01
  Administered 2020-07-25 – 2020-07-30 (×6): 50 mg via ORAL
  Filled 2020-07-25 (×8): qty 1

## 2020-07-25 MED ORDER — LIDOCAINE 2% (20 MG/ML) 5 ML SYRINGE
INTRAMUSCULAR | Status: AC
  Filled 2020-07-25: qty 5

## 2020-07-25 MED ORDER — ACETAMINOPHEN 325 MG PO TABS
650.0000 mg | ORAL_TABLET | Freq: Four times a day (QID) | ORAL | Status: DC | PRN
  Administered 2020-07-28 – 2020-07-30 (×5): 650 mg via ORAL
  Administered 2020-07-31: 325 mg via ORAL
  Administered 2020-08-02: 650 mg via ORAL
  Filled 2020-07-25 (×7): qty 2

## 2020-07-25 MED ORDER — DOCUSATE SODIUM 100 MG PO CAPS
100.0000 mg | ORAL_CAPSULE | Freq: Two times a day (BID) | ORAL | Status: DC | PRN
  Administered 2020-07-30 – 2020-08-01 (×2): 100 mg via ORAL
  Filled 2020-07-25 (×3): qty 1

## 2020-07-25 MED ORDER — EPHEDRINE 5 MG/ML INJ
INTRAVENOUS | Status: AC
  Filled 2020-07-25: qty 10

## 2020-07-25 MED ORDER — PROPOFOL 10 MG/ML IV BOLUS
INTRAVENOUS | Status: AC
  Filled 2020-07-25: qty 20

## 2020-07-25 MED ORDER — ROCURONIUM BROMIDE 10 MG/ML (PF) SYRINGE
PREFILLED_SYRINGE | INTRAVENOUS | Status: AC
  Filled 2020-07-25: qty 10

## 2020-07-25 MED ORDER — DEXAMETHASONE SODIUM PHOSPHATE 10 MG/ML IJ SOLN
INTRAMUSCULAR | Status: AC
  Filled 2020-07-25: qty 1

## 2020-07-25 MED ORDER — ACETAMINOPHEN 160 MG/5ML PO SOLN
325.0000 mg | Freq: Once | ORAL | Status: DC | PRN
Start: 2020-07-25 — End: 2020-07-25

## 2020-07-25 MED ORDER — ALBUTEROL SULFATE HFA 108 (90 BASE) MCG/ACT IN AERS
2.0000 | INHALATION_SPRAY | Freq: Four times a day (QID) | RESPIRATORY_TRACT | Status: DC | PRN
  Filled 2020-07-25: qty 6.7

## 2020-07-25 MED ORDER — SODIUM CHLORIDE 0.9 % IR SOLN
Status: DC | PRN
  Administered 2020-07-25: 2000 mL

## 2020-07-25 MED ORDER — LACTATED RINGERS IV SOLN: INTRAVENOUS | Status: DC | PRN

## 2020-07-25 MED ORDER — ATORVASTATIN CALCIUM 80 MG PO TABS
80.0000 mg | ORAL_TABLET | Freq: Every day | ORAL | Status: DC
  Administered 2020-07-26 – 2020-08-03 (×9): 80 mg via ORAL
  Filled 2020-07-25 (×9): qty 1

## 2020-07-25 MED ORDER — ACETAMINOPHEN 325 MG PO TABS
325.0000 mg | ORAL_TABLET | Freq: Once | ORAL | Status: DC | PRN
Start: 2020-07-25 — End: 2020-07-25

## 2020-07-25 MED ORDER — ONDANSETRON HCL 4 MG/2ML IJ SOLN
INTRAMUSCULAR | Status: DC | PRN
  Administered 2020-07-25: 4 mg via INTRAVENOUS

## 2020-07-25 MED ORDER — LISINOPRIL 2.5 MG PO TABS
2.5000 mg | ORAL_TABLET | Freq: Every day | ORAL | Status: DC
  Administered 2020-07-25 – 2020-07-26 (×2): 2.5 mg via ORAL
  Filled 2020-07-25 (×2): qty 1

## 2020-07-25 MED ORDER — HYDROMORPHONE HCL 1 MG/ML IJ SOLN
INTRAMUSCULAR | Status: AC
  Filled 2020-07-25: qty 1

## 2020-07-25 MED ORDER — ASPIRIN EC 81 MG PO TBEC
81.0000 mg | DELAYED_RELEASE_TABLET | Freq: Every day | ORAL | Status: DC
  Administered 2020-07-25 – 2020-08-03 (×10): 81 mg via ORAL
  Filled 2020-07-25 (×10): qty 1

## 2020-07-25 MED ORDER — PHENYLEPHRINE 40 MCG/ML (10ML) SYRINGE FOR IV PUSH (FOR BLOOD PRESSURE SUPPORT)
PREFILLED_SYRINGE | INTRAVENOUS | Status: AC
  Filled 2020-07-25: qty 10

## 2020-07-25 MED ORDER — CEFAZOLIN SODIUM-DEXTROSE 2-4 GM/100ML-% IV SOLN
2.0000 g | INTRAVENOUS | Status: AC
  Administered 2020-07-25: 2 g via INTRAVENOUS

## 2020-07-25 MED ORDER — LACTATED RINGERS IV SOLN: INTRAVENOUS | Status: DC

## 2020-07-25 MED ORDER — CEFAZOLIN SODIUM-DEXTROSE 2-4 GM/100ML-% IV SOLN
2.0000 g | Freq: Three times a day (TID) | INTRAVENOUS | Status: AC
  Administered 2020-07-25: 2 g via INTRAVENOUS
  Filled 2020-07-25: qty 100

## 2020-07-25 MED ORDER — FLUOXETINE HCL 20 MG PO CAPS
40.0000 mg | ORAL_CAPSULE | Freq: Two times a day (BID) | ORAL | Status: DC
  Filled 2020-07-25 (×2): qty 2

## 2020-07-25 MED ORDER — MEPERIDINE HCL 25 MG/ML IJ SOLN
6.2500 mg | INTRAMUSCULAR | Status: DC | PRN
Start: 2020-07-25 — End: 2020-07-25

## 2020-07-25 MED ORDER — EPHEDRINE SULFATE-NACL 50-0.9 MG/10ML-% IV SOSY
PREFILLED_SYRINGE | INTRAVENOUS | Status: DC | PRN
  Administered 2020-07-25: 7.5 mg via INTRAVENOUS

## 2020-07-25 MED ORDER — AMISULPRIDE (ANTIEMETIC) 5 MG/2ML IV SOLN: 10.0000 mg | Freq: Once | INTRAVENOUS | Status: DC | PRN

## 2020-07-25 MED ORDER — PHENYLEPHRINE HCL-NACL 10-0.9 MG/250ML-% IV SOLN
INTRAVENOUS | Status: DC | PRN
  Administered 2020-07-25: 50 ug/min via INTRAVENOUS

## 2020-07-25 MED ORDER — ROCURONIUM BROMIDE 10 MG/ML (PF) SYRINGE
PREFILLED_SYRINGE | INTRAVENOUS | Status: DC | PRN
  Administered 2020-07-25: 30 mg via INTRAVENOUS
  Administered 2020-07-25: 50 mg via INTRAVENOUS

## 2020-07-25 MED ORDER — LIDOCAINE 2% (20 MG/ML) 5 ML SYRINGE
INTRAMUSCULAR | Status: DC | PRN
  Administered 2020-07-25: 40 mg via INTRAVENOUS

## 2020-07-25 MED ORDER — SUGAMMADEX SODIUM 200 MG/2ML IV SOLN
INTRAVENOUS | Status: DC | PRN
  Administered 2020-07-25: 500 mg via INTRAVENOUS

## 2020-07-25 MED ORDER — CHLORHEXIDINE GLUCONATE 0.12 % MT SOLN: 15.0000 mL | Freq: Once | OROMUCOSAL | Status: AC

## 2020-07-25 MED ORDER — ORAL CARE MOUTH RINSE: 15.0000 mL | Freq: Once | OROMUCOSAL | Status: AC

## 2020-07-25 MED ORDER — CHLORHEXIDINE GLUCONATE 0.12 % MT SOLN
OROMUCOSAL | Status: AC
  Administered 2020-07-25: 15 mL via OROMUCOSAL
  Filled 2020-07-25: qty 15

## 2020-07-25 MED ORDER — ACETAMINOPHEN 10 MG/ML IV SOLN
INTRAVENOUS | Status: AC
  Filled 2020-07-25: qty 100

## 2020-07-25 SURGICAL SUPPLY — 57 items
BAG DECANTER FOR FLEXI CONT (MISCELLANEOUS) ×2 IMPLANT
BANDAGE ESMARK 6X9 LF (GAUZE/BANDAGES/DRESSINGS) IMPLANT
BENZOIN TINCTURE PRP APPL 2/3 (GAUZE/BANDAGES/DRESSINGS) ×4 IMPLANT
BLADE CLIPPER SURG (BLADE) ×2 IMPLANT
BLADE SURG 10 STRL SS (BLADE) ×2 IMPLANT
BNDG ESMARK 6X9 LF (GAUZE/BANDAGES/DRESSINGS)
BNDG GAUZE ELAST 4 BULKY (GAUZE/BANDAGES/DRESSINGS) IMPLANT
CANISTER SUCT 3000ML PPV (MISCELLANEOUS) ×2 IMPLANT
CANISTER WOUND CARE 500ML ATS (WOUND CARE) ×2 IMPLANT
CATH FOLEY 2WAY SLVR  5CC 16FR (CATHETERS)
CATH FOLEY 2WAY SLVR 5CC 16FR (CATHETERS) IMPLANT
CLIP VESOCCLUDE SM WIDE 24/CT (CLIP) IMPLANT
CNTNR URN SCR LID CUP LEK RST (MISCELLANEOUS) IMPLANT
CONT SPEC 4OZ STRL OR WHT (MISCELLANEOUS)
COVER SURGICAL LIGHT HANDLE (MISCELLANEOUS) ×4 IMPLANT
DRAPE HALF SHEET 40X57 (DRAPES) ×2 IMPLANT
DRAPE INCISE IOBAN 66X45 STRL (DRAPES) ×2 IMPLANT
DRAPE LAPAROSCOPIC ABDOMINAL (DRAPES) ×2 IMPLANT
DRAPE TABLE BACK 80X90 (DRAPES) ×2 IMPLANT
DRAPE WARM FLUID 44X44 (DRAPES) ×2 IMPLANT
DRSG AQUACEL AG ADV 3.5X14 (GAUZE/BANDAGES/DRESSINGS) ×2 IMPLANT
DRSG VAC ATS LRG SENSATRAC (GAUZE/BANDAGES/DRESSINGS) IMPLANT
DRSG VAC ATS MED SENSATRAC (GAUZE/BANDAGES/DRESSINGS) ×2 IMPLANT
DRSG VAC ATS SM SENSATRAC (GAUZE/BANDAGES/DRESSINGS) IMPLANT
ELECT CAUTERY BLADE 6.4 (BLADE) ×2 IMPLANT
ELECT REM PT RETURN 9FT ADLT (ELECTROSURGICAL) ×2
ELECTRODE REM PT RTRN 9FT ADLT (ELECTROSURGICAL) ×1 IMPLANT
GAUZE SPONGE 4X4 12PLY STRL (GAUZE/BANDAGES/DRESSINGS) ×2 IMPLANT
GAUZE XEROFORM 5X9 LF (GAUZE/BANDAGES/DRESSINGS) IMPLANT
GLOVE NEODERM STRL 7.5  LF PF (GLOVE) ×1
GLOVE NEODERM STRL 7.5 LF PF (GLOVE) ×1 IMPLANT
GLOVE SURG NEODERM 7.5  LF PF (GLOVE) ×1
GOWN STRL REUS W/ TWL LRG LVL3 (GOWN DISPOSABLE) ×3 IMPLANT
GOWN STRL REUS W/TWL LRG LVL3 (GOWN DISPOSABLE) ×6
HANDPIECE INTERPULSE COAX TIP (DISPOSABLE)
HEMOSTAT POWDER SURGIFOAM 1G (HEMOSTASIS) IMPLANT
HEMOSTAT SURGICEL 2X14 (HEMOSTASIS) IMPLANT
KIT BASIN OR (CUSTOM PROCEDURE TRAY) ×2 IMPLANT
KIT TURNOVER KIT B (KITS) ×2 IMPLANT
NS IRRIG 1000ML POUR BTL (IV SOLUTION) ×2 IMPLANT
PACK GENERAL/GYN (CUSTOM PROCEDURE TRAY) ×2 IMPLANT
PAD ARMBOARD 7.5X6 YLW CONV (MISCELLANEOUS) ×2 IMPLANT
SET HNDPC FAN SPRY TIP SCT (DISPOSABLE) IMPLANT
SLEEVE SCD COMPRESS KNEE MED (STOCKING) ×2 IMPLANT
SOL PREP POV-IOD 4OZ 10% (MISCELLANEOUS) IMPLANT
SPONGE LAP 18X18 RF (DISPOSABLE) ×2 IMPLANT
STAPLER VISISTAT 35W (STAPLE) IMPLANT
SUT ETHILON 3 0 FSL (SUTURE) IMPLANT
SUT PDS AB 1 CTX 36 (SUTURE) IMPLANT
SUT PROLENE 2 0 MH 48 (SUTURE) IMPLANT
SUT VIC AB 2-0 CTX 27 (SUTURE) ×4 IMPLANT
SWAB COLLECTION DEVICE MRSA (MISCELLANEOUS) ×2 IMPLANT
SWAB CULTURE ESWAB REG 1ML (MISCELLANEOUS) IMPLANT
SYR 5ML LL (SYRINGE) IMPLANT
TOWEL GREEN STERILE (TOWEL DISPOSABLE) ×2 IMPLANT
TOWEL GREEN STERILE FF (TOWEL DISPOSABLE) ×6 IMPLANT
WATER STERILE IRR 1000ML POUR (IV SOLUTION) ×2 IMPLANT

## 2020-07-25 NOTE — Anesthesia Procedure Notes (Signed)
Procedure Name: Intubation Date/Time: 07/25/2020 8:02 AM Performed by: Georgia Duff, CRNA Pre-anesthesia Checklist: Patient identified, Emergency Drugs available, Suction available and Patient being monitored Patient Re-evaluated:Patient Re-evaluated prior to induction Oxygen Delivery Method: Circle System Utilized Preoxygenation: Pre-oxygenation with 100% oxygen Induction Type: IV induction Ventilation: Mask ventilation without difficulty Laryngoscope Size: Miller and 2 Grade View: Grade I Tube type: Oral Tube size: 8.0 mm Number of attempts: 1 Airway Equipment and Method: Stylet and Oral airway Placement Confirmation: ETT inserted through vocal cords under direct vision,  positive ETCO2 and breath sounds checked- equal and bilateral Secured at: 23 cm Tube secured with: Tape Dental Injury: Teeth and Oropharynx as per pre-operative assessment

## 2020-07-25 NOTE — Anesthesia Postprocedure Evaluation (Signed)
Anesthesia Post Note  Patient: Daniel [name]  Procedure(s) Performed: sternal hardware removal and debridement (N/A )     Patient location during evaluation: PACU Anesthesia Type: General Level of consciousness: awake and alert Pain management: pain level controlled Vital Signs Assessment: post-procedure vital signs reviewed and stable Respiratory status: spontaneous breathing, nonlabored ventilation, respiratory function stable and patient connected to nasal cannula oxygen Cardiovascular status: blood pressure returned to baseline and stable Postop Assessment: no apparent nausea or vomiting Anesthetic complications: no   No complications documented.  Last Vitals:  Vitals:   07/25/20 1057 07/25/20 1552  BP: 139/67 (!) 145/70  Pulse: 64 82  Resp: 17 19  Temp: 36.8 C 36.6 C  SpO2: 97% 96%    Last Pain:  Vitals:   07/25/20 1552  TempSrc: Oral  PainSc:                  Effie Berkshire

## 2020-07-25 NOTE — Hospital Course (Addendum)
History of Present Illness:  Daniel [name] is a 70 yo male with history of BCC, Carotid Disease, COPD, CVA, DVT, HTN, PE, and CAD.  He is well known to [facility] having undergone CABG x 4 by Dr. Orvan Seen on 01/31/2020.  The patient recovered as expected post operatively.  Unfortunately he developed COVID over the winter.  He developed a lot of coughing during his infection and he began to notice a "clicking" and pain in his sternum.  He recently followed up with his Cardiologist Dr. Irish Lack at which time the patient admitted to the above symptoms.  A CT of the chest was obtained and showed separation of bony fragments in the area of his previous sternotomy.  There was also evidence of osteolysis and potential osteomyelitis in this area.  Due to the findings he was referred back to Dr. Orvan Seen for evaluation.  The patient was seen on 07/22/2020 at which time it was felt the patient would benefit from sternal reconstruction.  The risks and benefits of the procedure were explained to the patient and he was agreeable to proceed.  Hospital Course:  Daniel [name] presented to [facility] on 07/25/2020.  He was taken to the operating room and underwent a sternal hardware removal and debridement and wound VAC placement by Dr. Orvan Seen on 07/25/2020. Wound VAC was functioning properly post op. His pain was not well controlled post op day one, so scheduled Toradol and IV Morphine PRN were added, which helped. Patient returned to OR on 07/29/2020.  He underwent sternal reconstruction and placement of a Pravena wound vac. He tolerated the procedure without difficulty.  The patient had expected post operative pain.  This was exacerbated by patient not observing sternal precautions.  Physical therapy was consulted to help provide patient assistance with mobility and education on sternal precautions.  Pain management was achieved with Tramadol.  His JP drain output remained low.  However he developed serous drainage around tube site. The  JP drains were removed on 08/03/2020.  OR culture came back with rare Propionibacterium Acnes.  Infectious disease was consulted who recommended 4-6 weeks of IV Rocephin.  PICC line has been placed and home health has been arranged.  The patient remains hemodynamically stable.  He is now ambulating without issue.  He is felt medically stable for discharge home today.

## 2020-07-25 NOTE — H&P (Signed)
History and Physical Interval Note:  07/25/2020 7:41 AM  Daniel [name]  has presented today for surgery, with the diagnosis of sternal dehiscense.  The various methods of treatment have been discussed with the patient and family. After consideration of risks, benefits and other options for treatment, the patient has consented to  Procedure(s): STERNAL RECONSTRUCTION (N/A) as a surgical intervention.  The patient's history has been reviewed, patient examined, no change in status, stable for surgery.  I have reviewed the patient's chart and labs.  Questions were answered to the patient's satisfaction.     [name]

## 2020-07-25 NOTE — Transfer of Care (Signed)
Immediate Anesthesia Transfer of Care Note  Patient: Daniel [name]  Procedure(s) Performed: sternal hardware removal and debridement (N/A )  Patient Location: PACU  Anesthesia Type:General  Level of Consciousness: awake, alert  and patient cooperative  Airway & Oxygen Therapy: Patient Spontanous Breathing and Patient connected to nasal cannula oxygen  Post-op Assessment: Report given to RN and Post -op Vital signs reviewed and stable  Post vital signs: Reviewed and stable  Last Vitals:  Vitals Value Taken Time  BP 155/74 07/25/20 0923  Temp    Pulse 67 07/25/20 0924  Resp 16 07/25/20 0924  SpO2 100 % 07/25/20 0924  Vitals shown include unvalidated device data.  Last Pain:  Vitals:   07/25/20 0621  TempSrc:   PainSc: 4       Patients Stated Pain Goal: 2 (80/03/49 1791)  Complications: No complications documented.

## 2020-07-25 NOTE — Anesthesia Procedure Notes (Signed)
Arterial Line Insertion Start/End3/18/2022 8:10 AM, 07/25/2020 8:12 AM Performed by: Effie Berkshire, MD, anesthesiologist  Patient location: Pre-op. Preanesthetic checklist: patient identified, IV checked, site marked, risks and benefits discussed, surgical consent, monitors and equipment checked, pre-op evaluation, timeout performed and anesthesia consent Lidocaine 1% used for infiltration Right, radial was placed Catheter size: 20 Fr Hand hygiene performed  and maximum sterile barriers used   Attempts: 1 Procedure performed without using ultrasound guided technique. Following insertion, dressing applied and Biopatch. Post procedure assessment: normal and unchanged  Patient tolerated the procedure well with no immediate complications.

## 2020-07-25 NOTE — Brief Op Note (Signed)
07/25/2020  10:25 AM  PATIENT:  Daniel [name]  70 y.o. male  PRE-OPERATIVE DIAGNOSIS:  sternal dehiscense  POST-OPERATIVE DIAGNOSIS:  sternal dehiscense  PROCEDURE:  Procedure(s): sternal hardware removal and debridement (N/A)  SURGEON:  Surgeon(s) and Role:    * [name], MD - Primary  PHYSICIAN ASSISTANT:   ASSISTANTS: staff   ANESTHESIA:   general  EBL:  Minimal   BLOOD ADMINISTERED:none  DRAINS: none   LOCAL MEDICATIONS USED:  NONE  SPECIMEN:  Source of Specimen:  sternal swab  DISPOSITION OF SPECIMEN:  micro  COUNTS:  YES  TOURNIQUET:  * No tourniquets in log *  DICTATION: .Note written in EPIC  PLAN OF CARE: Admit to inpatient   PATIENT DISPOSITION:  PACU - hemodynamically stable.   Delay start of Pharmacological VTE agent (>24hrs) due to surgical blood loss or risk of bleeding: yes

## 2020-07-25 NOTE — Plan of Care (Signed)
  Problem: Education: Goal: Knowledge of General Education information will improve Description Including pain rating scale, medication(s)/side effects and non-pharmacologic comfort measures Outcome: Progressing   

## 2020-07-26 ENCOUNTER — Encounter (HOSPITAL_COMMUNITY): Payer: Self-pay | Admitting: Cardiothoracic Surgery

## 2020-07-26 ENCOUNTER — Inpatient Hospital Stay (HOSPITAL_COMMUNITY): Payer: No Typology Code available for payment source

## 2020-07-26 LAB — BASIC METABOLIC PANEL
Anion gap: 6 (ref 5–15)
BUN: 14 mg/dL (ref 8–23)
CO2: 26 mmol/L (ref 22–32)
Calcium: 9 mg/dL (ref 8.9–10.3)
Chloride: 106 mmol/L (ref 98–111)
Creatinine, Ser: 0.79 mg/dL (ref 0.61–1.24)
GFR, Estimated: >60 mL/min (ref >60)
Glucose, Bld: 164 mg/dL — ABNORMAL HIGH (ref 70–99)
Potassium: 4.2 mmol/L (ref 3.5–5.1)
Sodium: 138 mmol/L (ref 135–145)

## 2020-07-26 LAB — CBC
HCT: 34.9 % — ABNORMAL LOW (ref 39.0–52.0)
Hemoglobin: 11 g/dL — ABNORMAL LOW (ref 13.0–17.0)
MCH: 23.3 pg — ABNORMAL LOW (ref 26.0–34.0)
MCHC: 31.5 g/dL (ref 30.0–36.0)
MCV: 73.9 fL — ABNORMAL LOW (ref 80.0–100.0)
Platelets: 234 10*3/uL (ref 150–400)
RBC: 4.72 MIL/uL (ref 4.22–5.81)
RDW: 20.7 % — ABNORMAL HIGH (ref 11.5–15.5)
WBC: 12.1 10*3/uL — ABNORMAL HIGH (ref 4.0–10.5)
nRBC: 0 % (ref 0.0–0.2)

## 2020-07-26 MED ORDER — MORPHINE SULFATE (PF) 2 MG/ML IV SOLN
1.0000 mg | INTRAVENOUS | Status: DC | PRN
  Administered 2020-07-29 – 2020-08-02 (×7): 2 mg via INTRAVENOUS
  Filled 2020-07-26 (×8): qty 1

## 2020-07-26 MED ORDER — LEVALBUTEROL TARTRATE 45 MCG/ACT IN AERO
2.0000 | INHALATION_SPRAY | Freq: Four times a day (QID) | RESPIRATORY_TRACT | Status: DC
  Administered 2020-07-26 – 2020-07-27 (×4): 2 via RESPIRATORY_TRACT
  Filled 2020-07-26: qty 15

## 2020-07-26 MED ORDER — LEVALBUTEROL TARTRATE 45 MCG/ACT IN AERO
2.0000 | INHALATION_SPRAY | Freq: Four times a day (QID) | RESPIRATORY_TRACT | Status: DC
  Filled 2020-07-26: qty 15

## 2020-07-26 MED ORDER — KETOROLAC TROMETHAMINE 15 MG/ML IJ SOLN: 15.0000 mg | Freq: Four times a day (QID) | INTRAMUSCULAR | Status: DC

## 2020-07-26 MED ORDER — KETOROLAC TROMETHAMINE 15 MG/ML IJ SOLN
7.5000 mg | Freq: Four times a day (QID) | INTRAMUSCULAR | Status: DC
  Administered 2020-07-26 – 2020-07-27 (×4): 7.5 mg via INTRAVENOUS
  Filled 2020-07-26 (×4): qty 1

## 2020-07-26 NOTE — Progress Notes (Addendum)
      [address]       [place], [address]             [phone]        1 Day Post-Op Procedure(s) (LRB): sternal hardware removal and debridement (N/A)  Subjective: Patient with complaints of pain this am.  Objective: Vital signs in last 24 hours: Temp:  [97 F (36.1 C)-98.2 F (36.8 C)] 97.8 F (36.6 C) (03/19 0305) Pulse Rate:  [59-82] 68 (03/19 0305) Cardiac Rhythm: Normal sinus rhythm (03/18 1932) Resp:  [12-19] 19 (03/19 0305) BP: (137-155)/(61-112) 139/112 (03/19 0305) SpO2:  [93 %-100 %] 97 % (03/19 0305) Weight:  [92.5 kg] 92.5 kg (03/18 1057)   Current Weight  07/25/20 92.5 kg       Intake/Output from previous day: 03/18 0701 - 03/19 0700 In: 1939.7 [I.V.:1500; IV Piggyback:289.7] Out: 2340 [Urine:2250; Drains:90]   Physical Exam:  Cardiovascular: RRR Pulmonary: Clear to auscultation bilaterally Abdomen: Soft, non tender, bowel sounds present. Wounds: Wound VAC in place on sternum and functioning well. Bloody drainage in cannister  Lab Results: CBC: Recent Labs    07/24/20 1359 07/26/20 0146  WBC 7.0 12.1*  HGB 11.7* 11.0*  HCT 36.4* 34.9*  PLT 252 234   BMET:  Recent Labs    07/24/20 1359 07/26/20 0146  NA 137 138  K 4.3 4.2  CL 107 106  CO2 20* 26  GLUCOSE 93 164*  BUN 13 14  CREATININE 0.78 0.79  CALCIUM 9.2 9.0    PT/INR:  Lab Results  Component Value Date   INR 1.0 07/24/2020   INR 1.4 (H) 01/31/2020   INR 1.0 01/30/2020   ABG:  INR: Will add last result for INR, ABG once components are confirmed Will add last 4 CBG results once components are confirmed  Assessment/Plan:  1. CV - SR with HR in the 60's. On Lopressor 25 mg bid and Lisinopril 2.5 mg daily. He is hypertensive this , which may be related to uncontrolled pain. If continues, will increase Lisinopril. 2.  Pulmonary - On room air. CXR this am appears stable. 3. S/p sternal hardware removal and debridement. Drain with 90 cc last 12  hours. Wound VAC functioning properly 4. Anemia-H and H this am slightly decreased to 11 and 34.9 5. Regarding pain control, will give several doses of Toradol and can consider Oxy PRN. Will also give IMorphine IV PRN for breakthrough pain  Donielle M ZimmermanPA-C 07/26/2020,7:31 AM   Pt seen and examined; agree with documentation. Dionisio Aragones Z. Orvan Seen, [place]

## 2020-07-27 LAB — C-REACTIVE PROTEIN: CRP: 0.7 mg/dL (ref <1.0)

## 2020-07-27 MED ORDER — CALCIUM CARBONATE ANTACID 500 MG PO CHEW: 1.0000 | CHEWABLE_TABLET | Freq: Three times a day (TID) | ORAL | Status: DC | PRN

## 2020-07-27 MED ORDER — LISINOPRIL 5 MG PO TABS
5.0000 mg | ORAL_TABLET | Freq: Every day | ORAL | Status: DC
  Administered 2020-07-27 – 2020-08-03 (×7): 5 mg via ORAL
  Filled 2020-07-27 (×7): qty 1

## 2020-07-27 MED ORDER — KETOROLAC TROMETHAMINE 15 MG/ML IJ SOLN
7.5000 mg | Freq: Four times a day (QID) | INTRAMUSCULAR | Status: AC
  Administered 2020-07-27 – 2020-07-28 (×4): 7.5 mg via INTRAVENOUS
  Filled 2020-07-27 (×4): qty 1

## 2020-07-27 MED ORDER — ADULT MULTIVITAMIN W/MINERALS CH
1.0000 | ORAL_TABLET | Freq: Every day | ORAL | Status: DC
  Administered 2020-07-27 – 2020-08-03 (×7): 1 via ORAL
  Filled 2020-07-27 (×7): qty 1

## 2020-07-27 MED ORDER — ENSURE ENLIVE PO LIQD
237.0000 mL | Freq: Two times a day (BID) | ORAL | Status: DC
  Administered 2020-07-27: 237 mL via ORAL

## 2020-07-27 MED ORDER — LEVALBUTEROL TARTRATE 45 MCG/ACT IN AERO
2.0000 | INHALATION_SPRAY | RESPIRATORY_TRACT | Status: DC | PRN
  Filled 2020-07-27: qty 15

## 2020-07-27 MED ORDER — GUAIFENESIN-DM 100-10 MG/5ML PO SYRP: 5.0000 mL | ORAL_SOLUTION | ORAL | Status: DC | PRN

## 2020-07-27 NOTE — Plan of Care (Signed)
  Problem: Health Behavior/Discharge Planning: Goal: Ability to manage health-related needs will improve Outcome: Progressing   Problem: Clinical Measurements: Goal: Will remain free from infection Outcome: Progressing Goal: Diagnostic test results will improve Outcome: Progressing   

## 2020-07-27 NOTE — Progress Notes (Signed)
Midsternal wound vac dressing replaced by MD at bedside.  Negative pressure wound therapy resumed.  Will continue to monitor.

## 2020-07-27 NOTE — Progress Notes (Signed)
Patient c/o "indigestion". This RN kept asking patient if he was okay. Patient stated that it was just indigestion. Gave zofran, ginger ale. Patient sat on side of the bed and had a couple of belches. Instructed patient to raise his head up some to see if that helps.   Patient had to lay back down d/t him feeling "lightheaded". Will continue to monitor

## 2020-07-27 NOTE — Progress Notes (Signed)
Initial Nutrition Assessment  RD working remotely.  DOCUMENTATION CODES:   Not applicable  INTERVENTION:   - MVI with minerals daily  - Ensure Enlive po BID, each supplement provides 350 kcal and 20 grams of protein  NUTRITION DIAGNOSIS:   Increased nutrient needs related to wound healing as evidenced by estimated needs.  GOAL:   Patient will meet greater than or equal to 90% of their needs  MONITOR:   PO intake,Supplement acceptance,Weight trends,I & O's  REASON FOR ASSESSMENT:   Malnutrition Screening Tool    ASSESSMENT:   70 year old male who presented on 3/18 for sternal hardware removal and debridement due to diagnosis of sternal dehiscence. PMH of CVA, carotid stenosis, CAD s/p CABG in September 2021, HTN, HLD, CHF.   3/18 - s/p sternal hardware removal and debridement, wound VAC placed  RD spoke with pt briefly via phone call to room. Pt reports that he spilled his coffee on himself and requested RD to call back later so he could clean up. RD called back about 45 minutes later; however, no answer. Will attempt to obtain diet and weight history at follow-up.  Reviewed weight history in chart. Noted weight loss in September and October 2021 but weight began trending up in January 2022 and current weight is close to initial weight prior to weight loss.  RD will order oral nutrition supplements to aid pt in meeting increased kcal and protein needs related to wound healing.  Meal Completion: 100% x 3 meals  Medications and labs reviewed.  VAC output: 130 ml x 24 hours  NUTRITION - FOCUSED PHYSICAL EXAM:  Unable to complete at this time. RD working remotely.  Diet Order:   Diet Order            Diet Heart Room service appropriate? Yes with Assist; Fluid consistency: Thin  Diet effective now                 EDUCATION NEEDS:   No education needs have been identified at this time  Skin:  Skin Assessment:  Skin Integrity Issues: Wound VAC:  chest  Last BM:  07/24/20  Height:   Ht Readings from Last 1 Encounters:  07/25/20 5\' 11"  (1.803 m)    Weight:   Wt Readings from Last 1 Encounters:  07/25/20 92.5 kg    BMI:  Body mass index is 28.44 kg/m.  Estimated Nutritional Needs:   Kcal:  2200-2400  Protein:  115-130 grams  Fluid:  >/= 2.0 L    Daniel Bryant, MS, RD, LDN Inpatient Clinical Dietitian Please see AMiON for contact information.

## 2020-07-27 NOTE — Progress Notes (Addendum)
      [address]       [place] [address]             [phone]        2 Days Post-Op Procedure(s) (LRB): sternal hardware removal and debridement (N/A)  Subjective: Patient states pain is slightly better but is always worse with coughing.  Objective: Vital signs in last 24 hours: Temp:  [98.1 F (36.7 C)-98.3 F (36.8 C)] 98.1 F (36.7 C) (03/20 0733) Pulse Rate:  [60-73] 66 (03/20 0733) Cardiac Rhythm: Normal sinus rhythm (03/19 2000) Resp:  [16-18] 18 (03/20 0733) BP: (124-141)/(61-78) 136/76 (03/20 0733) SpO2:  [95 %-100 %] 97 % (03/20 0733)   Current Weight  07/25/20 92.5 kg       Intake/Output from previous day: 03/19 0701 - 03/20 0700 In: 720 [P.O.:720] Out: 130 [Drains:130]   Physical Exam:  Cardiovascular: RRR Pulmonary: Clear to auscultation bilaterally Abdomen: Soft, non tender, bowel sounds present. Wounds: Wound VAC in place on sternum and functioning well. Bloody drainage in cannister  Lab Results: CBC: Recent Labs    07/24/20 1359 07/26/20 0146  WBC 7.0 12.1*  HGB 11.7* 11.0*  HCT 36.4* 34.9*  PLT 252 234   BMET:  Recent Labs    07/24/20 1359 07/26/20 0146  NA 137 138  K 4.3 4.2  CL 107 106  CO2 20* 26  GLUCOSE 93 164*  BUN 13 14  CREATININE 0.78 0.79  CALCIUM 9.2 9.0    PT/INR:  Lab Results  Component Value Date   INR 1.0 07/24/2020   INR 1.4 (H) 01/31/2020   INR 1.0 01/30/2020   ABG:  INR: Will add last result for INR, ABG once components are confirmed Will add last 4 CBG results once components are confirmed  Assessment/Plan:  1. CV - SR with HR in the 60's. On Lopressor 25 mg bid and Lisinopril 2.5 mg daily. Will increase Lisinopril to 5 mg daily for better BP control. 2.  Pulmonary - On room air.  3. S/p sternal hardware removal and debridement. Drain with 130 cc last 24 hours. Wound VAC functioning properly. Likely return to OR early this week 4. Anemia-H and H 3/19 11 and 34.9 5.  Regarding pain control, will give several doses of Toradol and can consider Oxy PRN. Will also give IMorphine IV PRN for breakthrough pain 6. Tums PRN indigestion  Jahad Old M ZimmermanPA-C 07/27/2020,7:37 AM

## 2020-07-27 NOTE — Progress Notes (Addendum)
Patient called out concerning his wound vac. Patient stated that, "he heard a pop and it felt like a balloon was deflating". This RN and another RN assessed wound vac. Target pressure was only at 100. Order is for 125. Adjusted pressure. Seal check done. No air leak noted.   Site very tender. Wound vac still draining. Patient very concerned. Lungs clear/diminshed.   This RN listened and thought to hear crackles Bilateral upper lobes. Coming from wound vac draining??   Will continue to monitor. Patient denies SOB or chest pain other than coming from the incision site.

## 2020-07-28 ENCOUNTER — Inpatient Hospital Stay (HOSPITAL_COMMUNITY): Payer: No Typology Code available for payment source

## 2020-07-28 LAB — BASIC METABOLIC PANEL
Anion gap: 4 — ABNORMAL LOW (ref 5–15)
BUN: 15 mg/dL (ref 8–23)
CO2: 28 mmol/L (ref 22–32)
Calcium: 8.5 mg/dL — ABNORMAL LOW (ref 8.9–10.3)
Chloride: 108 mmol/L (ref 98–111)
Creatinine, Ser: 0.84 mg/dL (ref 0.61–1.24)
GFR, Estimated: >60 mL/min (ref >60)
Glucose, Bld: 101 mg/dL — ABNORMAL HIGH (ref 70–99)
Potassium: 4 mmol/L (ref 3.5–5.1)
Sodium: 140 mmol/L (ref 135–145)

## 2020-07-28 MED ORDER — LIDOCAINE 5 % EX PTCH
1.0000 | MEDICATED_PATCH | CUTANEOUS | Status: DC
  Administered 2020-07-28 – 2020-08-02 (×4): 1 via TRANSDERMAL
  Filled 2020-07-28 (×5): qty 1

## 2020-07-28 MED ORDER — KETOROLAC TROMETHAMINE 15 MG/ML IJ SOLN
15.0000 mg | Freq: Once | INTRAMUSCULAR | Status: AC
  Administered 2020-07-28: 15 mg via INTRAVENOUS
  Filled 2020-07-28: qty 1

## 2020-07-28 MED ORDER — POLYETHYLENE GLYCOL 3350 17 G PO PACK
17.0000 g | PACK | Freq: Every day | ORAL | Status: DC
  Administered 2020-07-28 – 2020-08-03 (×7): 17 g via ORAL
  Filled 2020-07-28 (×7): qty 1

## 2020-07-28 NOTE — Progress Notes (Signed)
      [address]       [place] [address]             [phone]      3 Days Post-Op Procedure(s) (LRB): sternal hardware removal and debridement (N/A)   Subjective:  Patient states not doing well this morning.  He is experiencing a lot of chest discomfort this morning.  He also states that his chest "popped" last night.    Objective: Vital signs in last 24 hours: Temp:  [97.7 F (36.5 C)-98.4 F (36.9 C)] 97.7 F (36.5 C) (03/21 0334) Pulse Rate:  [62-100] 79 (03/21 0334) Cardiac Rhythm: (P) Normal sinus rhythm (03/20 2300) Resp:  [16-20] 16 (03/21 0334) BP: (126-160)/(58-82) 136/58 (03/21 0334) SpO2:  [96 %-99 %] 96 % (03/21 0334)  Intake/Output from previous day: 03/20 0701 - 03/21 0700 In: 240 [P.O.:240] Out: 250 [Urine:250]  General appearance: alert, cooperative and no distress Heart: regular rate and rhythm Lungs: clear to auscultation bilaterally Abdomen: soft, non-tender; bowel sounds normal; no masses,  no organomegaly Extremities: extremities normal, atraumatic, no cyanosis or edema Wound: wound vac in place, functioning appropriately  Lab Results: Recent Labs    07/26/20 0146  WBC 12.1*  HGB 11.0*  HCT 34.9*  PLT 234   BMET:  Recent Labs    07/26/20 0146 07/28/20 0228  NA 138 140  K 4.2 4.0  CL 106 108  CO2 26 28  GLUCOSE 164* 101*  BUN 14 15  CREATININE 0.79 0.84  CALCIUM 9.0 8.5*    PT/INR: No results for input(s): LABPROT, INR in the last 72 hours. ABG    Component Value Date/Time   PHART 7.391 07/24/2020 1410   HCO3 23.4 07/24/2020 1410   TCO2 23 02/01/2020 0102   ACIDBASEDEF 0.9 07/24/2020 1410   O2SAT 97.0 07/24/2020 1410   CBG (last 3)  No results for input(s): GLUCAP in the last 72 hours.  Assessment/Plan: S/P Procedure(s) (LRB): sternal hardware removal and debridement (N/A)  1. CV- NSR, + HTN- could be related to pain, on Lopressor, Zestril.. will monitor if needed can increase zestril dose 2.  Pulm- no acute issues, felt "pop" in his sternum will get CXR  3. Renal- creatinine WNL, K okay 4. Pain control- will give Toradol at 15 mg this morning, if no relief can adjust medication regimen  5. ID- afebrile, OR culture no growth to date, to OR for sternal reconstruction, vac change tomorrow    LOS: 3 days   Ellwood Handler, PA-C 07/28/2020

## 2020-07-28 NOTE — Progress Notes (Signed)
      [address]       [place] [address]             [phone]      Contacted via nursing in regards to patient having pain due to wound vac.  He was requesting some one come to his room to assess.  The patient states he is having pain along the top of his wound vac and most in his neck area.  He states he hasn't walked.  He was unable to determine if pain medication administered had provided any relief.  Exam: no distress Wound vac- remains in stable position from this morning, there is good seal without evidence of leak  A/P:  1. Patient doesn't wish for any additional narcotic pain medication.  He has had several doses of Toradol which doesn't seem to relieve his pain.  Will add Lidoderm patches.  Patient is also not laying very well in the bed.  He was encouraged to spend time in the chair most of the day which will be better for neck and back pain over the bed.  2. Nursing to ambulate patient in the hallway 3. Leave wound vac in place to current settings, for OR sternal reconstruction, repeat wound debridement and wound vac change tomorrow afternoon.  Ellwood Handler, PA-C

## 2020-07-28 NOTE — Op Note (Signed)
Procedure(s): sternal hardware removal and debridement Procedure Note  Daniel [name] male 70 y.o. 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  He contracted Covid and did a lot of coughing.  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  Is taken the operating room for attempted sternal reconstruction     Surgeon: [name]   Assistants: Staff  Anesthesia: General endotracheal anesthesia  ASA Class: 3    Procedure Detail  sternal hardware removal and debridement After informed consent was obtained, he is taken to the operating room on the above date.  He is placed in the supine position.  Anesthesia is induced with a general endotracheal technique smoothly.  The chest was cleansed and draped in sterile field using Betadine paint and soap.  Preop surgical pause was performed.  The prior median sternotomy incision is reopened sharply.  There was a significant amount of edema within the presternal tissues.  Sternal wires which were broken were removed.  There was a significant amount of oozing and bleeding and as well as edema is noted.  This is related to a recent administration of antiplatelet therapy.  The left side of the sternum is seen to be in multiple fragments.  The right side of the sternum is more intact.  The decision was made to place a VAC sponge due to the desire to have the inflammatory process calm down before definitive sternal reconstruction.  The VAC sponge was cut to fit the defect and covered with VAC drape.  This concluded the procedure.  Estimated Blood Loss:  less than 50 mL         Drains: None        Blood Given: none          Specimens: Sternal swab for culture Implants: none        Complications:  * No complications entered in OR log *          Disposition: PACU - hemodynamically stable.         Condition: stable

## 2020-07-28 NOTE — Progress Notes (Signed)
Called PA Erin per pt request. Pt states there is something wrong with his wound vac at top of incision. I unsnapped top snaps on pt gown to make more comfortable.  Area is pink and dressing depressed in incision but not different from morning assessment. PA will see patient after office. Pt did not want pain medication as he was not hurting but could not get comfortable. Pt resting with call bell within reach.  Will continue to monitor.

## 2020-07-28 NOTE — Plan of Care (Signed)
  Problem: Clinical Measurements: Goal: Will remain free from infection Outcome: Not Progressing   

## 2020-07-28 NOTE — Progress Notes (Signed)
Ambulated patient in hallway 300 ft with rolling walker. Returned to room and pt sitting in recliner and brushing his teeth.  Changed bed linen. Encouraged pt to continue sitting in chair until dinner or later if able to tolerate.  Pt has been encouraged to change position throughout day as he has been uncomfortable in the bed. Pt aware that lidocaine patch will be applied to help with discomfort. Pt resting with call bell within reach.  Will continue to monitor.

## 2020-07-28 NOTE — Progress Notes (Signed)
Patient has complaint of extreme soreness and pain at this time. Not time for next dose of prn tramadol. Offered patient prn IV Morphine for treatment of breakthrough pain. Patient stated he did not want at this time. He preferred to just wait until next po med is due.

## 2020-07-29 ENCOUNTER — Inpatient Hospital Stay (HOSPITAL_COMMUNITY): Payer: No Typology Code available for payment source | Admitting: Anesthesiology

## 2020-07-29 ENCOUNTER — Encounter (HOSPITAL_COMMUNITY): Payer: Self-pay | Admitting: Cardiothoracic Surgery

## 2020-07-29 ENCOUNTER — Encounter (HOSPITAL_COMMUNITY)
Admission: RE | Disposition: A | Discharge status: Home Health | Payer: Self-pay | Source: Home / Self Care | Attending: Cardiothoracic Surgery

## 2020-07-29 HISTORY — PX: APPLICATION OF WOUND VAC: SHX5189

## 2020-07-29 HISTORY — PX: STERNAL WOUND DEBRIDEMENT: SHX1058

## 2020-07-29 LAB — BASIC METABOLIC PANEL
Anion gap: 9 (ref 5–15)
BUN: 16 mg/dL (ref 8–23)
CO2: 26 mmol/L (ref 22–32)
Calcium: 9 mg/dL (ref 8.9–10.3)
Chloride: 101 mmol/L (ref 98–111)
Creatinine, Ser: 0.95 mg/dL (ref 0.61–1.24)
GFR, Estimated: >60 mL/min (ref >60)
Glucose, Bld: 108 mg/dL — ABNORMAL HIGH (ref 70–99)
Potassium: 4.1 mmol/L (ref 3.5–5.1)
Sodium: 136 mmol/L (ref 135–145)

## 2020-07-29 SURGERY — DEBRIDEMENT, WOUND, STERNUM
Anesthesia: General

## 2020-07-29 MED ORDER — PHENYLEPHRINE 40 MCG/ML (10ML) SYRINGE FOR IV PUSH (FOR BLOOD PRESSURE SUPPORT)
PREFILLED_SYRINGE | INTRAVENOUS | Status: DC | PRN
  Administered 2020-07-29: 120 ug via INTRAVENOUS

## 2020-07-29 MED ORDER — FENTANYL CITRATE (PF) 250 MCG/5ML IJ SOLN
INTRAMUSCULAR | Status: AC
  Filled 2020-07-29: qty 5

## 2020-07-29 MED ORDER — VANCOMYCIN HCL 1000 MG IV SOLR
INTRAVENOUS | Status: DC | PRN
  Administered 2020-07-29: 1000 mg

## 2020-07-29 MED ORDER — ONDANSETRON HCL 4 MG/2ML IJ SOLN
INTRAMUSCULAR | Status: DC | PRN
  Administered 2020-07-29: 4 mg via INTRAVENOUS

## 2020-07-29 MED ORDER — LIDOCAINE 2% (20 MG/ML) 5 ML SYRINGE
INTRAMUSCULAR | Status: DC | PRN
  Administered 2020-07-29: 100 mg via INTRAVENOUS

## 2020-07-29 MED ORDER — MIDAZOLAM HCL 2 MG/2ML IJ SOLN
INTRAMUSCULAR | Status: AC
  Filled 2020-07-29: qty 2

## 2020-07-29 MED ORDER — DEXAMETHASONE SODIUM PHOSPHATE 10 MG/ML IJ SOLN
INTRAMUSCULAR | Status: DC | PRN
  Administered 2020-07-29: 5 mg via INTRAVENOUS

## 2020-07-29 MED ORDER — PROPOFOL 10 MG/ML IV BOLUS
INTRAVENOUS | Status: AC
  Filled 2020-07-29: qty 20

## 2020-07-29 MED ORDER — FENTANYL CITRATE (PF) 250 MCG/5ML IJ SOLN
INTRAMUSCULAR | Status: DC | PRN
  Administered 2020-07-29: 100 ug via INTRAVENOUS
  Administered 2020-07-29 (×2): 25 ug via INTRAVENOUS
  Administered 2020-07-29 (×2): 50 ug via INTRAVENOUS

## 2020-07-29 MED ORDER — LACTATED RINGERS IV SOLN: INTRAVENOUS | Status: DC

## 2020-07-29 MED ORDER — CHLORHEXIDINE GLUCONATE 0.12 % MT SOLN: 15.0000 mL | Freq: Once | OROMUCOSAL | Status: AC

## 2020-07-29 MED ORDER — CHLORHEXIDINE GLUCONATE 0.12 % MT SOLN
OROMUCOSAL | Status: AC
  Administered 2020-07-29: 15 mL via OROMUCOSAL
  Filled 2020-07-29: qty 15

## 2020-07-29 MED ORDER — CEFAZOLIN SODIUM-DEXTROSE 2-3 GM-%(50ML) IV SOLR
INTRAVENOUS | Status: DC | PRN
  Administered 2020-07-29: 2 g via INTRAVENOUS

## 2020-07-29 MED ORDER — VANCOMYCIN HCL 1000 MG IV SOLR
INTRAVENOUS | Status: AC
  Filled 2020-07-29: qty 1000

## 2020-07-29 MED ORDER — ORAL CARE MOUTH RINSE: 15.0000 mL | Freq: Once | OROMUCOSAL | Status: AC

## 2020-07-29 MED ORDER — PROPOFOL 10 MG/ML IV BOLUS
INTRAVENOUS | Status: DC | PRN
  Administered 2020-07-29: 100 mg via INTRAVENOUS

## 2020-07-29 MED ORDER — SUGAMMADEX SODIUM 200 MG/2ML IV SOLN
INTRAVENOUS | Status: DC | PRN
  Administered 2020-07-29: 200 mg via INTRAVENOUS

## 2020-07-29 MED ORDER — MIDAZOLAM HCL 2 MG/2ML IJ SOLN
INTRAMUSCULAR | Status: DC | PRN
  Administered 2020-07-29: 1 mg via INTRAVENOUS

## 2020-07-29 MED ORDER — ROCURONIUM BROMIDE 10 MG/ML (PF) SYRINGE
PREFILLED_SYRINGE | INTRAVENOUS | Status: DC | PRN
  Administered 2020-07-29: 30 mg via INTRAVENOUS
  Administered 2020-07-29: 20 mg via INTRAVENOUS
  Administered 2020-07-29: 50 mg via INTRAVENOUS

## 2020-07-29 MED ORDER — PHENYLEPHRINE HCL-NACL 10-0.9 MG/250ML-% IV SOLN
INTRAVENOUS | Status: DC | PRN
  Administered 2020-07-29: 20 ug/min via INTRAVENOUS

## 2020-07-29 MED ORDER — SODIUM CHLORIDE 0.9 % IR SOLN
Status: DC | PRN
  Administered 2020-07-29: 1000 mL

## 2020-07-29 MED ORDER — FENTANYL CITRATE (PF) 100 MCG/2ML IJ SOLN: 25.0000 ug | INTRAMUSCULAR | Status: DC | PRN

## 2020-07-29 MED ORDER — ONDANSETRON HCL 4 MG/2ML IJ SOLN: 4.0000 mg | Freq: Once | INTRAMUSCULAR | Status: DC | PRN

## 2020-07-29 SURGICAL SUPPLY — 67 items
BAG DECANTER FOR FLEXI CONT (MISCELLANEOUS) ×3 IMPLANT
BANDAGE ESMARK 6X9 LF (GAUZE/BANDAGES/DRESSINGS) IMPLANT
BATTERY MAXDRIVER (MISCELLANEOUS) ×2 IMPLANT
BENZOIN TINCTURE PRP APPL 2/3 (GAUZE/BANDAGES/DRESSINGS) IMPLANT
BLADE CLIPPER SURG (BLADE) ×3 IMPLANT
BLADE SURG 10 STRL SS (BLADE) ×3 IMPLANT
BNDG ESMARK 6X9 LF (GAUZE/BANDAGES/DRESSINGS)
BNDG GAUZE ELAST 4 BULKY (GAUZE/BANDAGES/DRESSINGS) IMPLANT
CANISTER SUCT 3000ML PPV (MISCELLANEOUS) ×3 IMPLANT
CANISTER WOUND CARE 500ML ATS (WOUND CARE) ×3 IMPLANT
CATH FOLEY 2WAY SLVR  5CC 16FR (CATHETERS)
CATH FOLEY 2WAY SLVR 5CC 16FR (CATHETERS) IMPLANT
CLIP VESOCCLUDE SM WIDE 24/CT (CLIP) IMPLANT
CNTNR URN SCR LID CUP LEK RST (MISCELLANEOUS) IMPLANT
CONT SPEC 4OZ STRL OR WHT (MISCELLANEOUS)
COVER SURGICAL LIGHT HANDLE (MISCELLANEOUS) ×6 IMPLANT
DERMABOND ADVANCED (GAUZE/BANDAGES/DRESSINGS) ×2
DERMABOND ADVANCED .7 DNX12 (GAUZE/BANDAGES/DRESSINGS) IMPLANT
DRAPE INCISE IOBAN 66X45 STRL (DRAPES) IMPLANT
DRAPE LAPAROSCOPIC ABDOMINAL (DRAPES) ×3 IMPLANT
DRAPE WARM FLUID 44X44 (DRAPES) IMPLANT
DRESSING PREVENA PLUS CUSTOM (GAUZE/BANDAGES/DRESSINGS) IMPLANT
DRSG AQUACEL AG ADV 3.5X14 (GAUZE/BANDAGES/DRESSINGS) ×3 IMPLANT
DRSG PREVENA PLUS CUSTOM (GAUZE/BANDAGES/DRESSINGS) ×3
DRSG VAC ATS LRG SENSATRAC (GAUZE/BANDAGES/DRESSINGS) IMPLANT
DRSG VAC ATS MED SENSATRAC (GAUZE/BANDAGES/DRESSINGS) IMPLANT
DRSG VAC ATS SM SENSATRAC (GAUZE/BANDAGES/DRESSINGS) IMPLANT
ELECT REM PT RETURN 9FT ADLT (ELECTROSURGICAL) ×3
ELECTRODE REM PT RTRN 9FT ADLT (ELECTROSURGICAL) ×1 IMPLANT
GAUZE SPONGE 4X4 12PLY STRL (GAUZE/BANDAGES/DRESSINGS) ×3 IMPLANT
GAUZE XEROFORM 5X9 LF (GAUZE/BANDAGES/DRESSINGS) IMPLANT
GLOVE NEODERM STRL 7.5  LF PF (GLOVE) ×1
GLOVE NEODERM STRL 7.5 LF PF (GLOVE) ×1 IMPLANT
GLOVE SURG NEODERM 7.5  LF PF (GLOVE) ×2
GOWN STRL REUS W/ TWL LRG LVL3 (GOWN DISPOSABLE) ×3 IMPLANT
GOWN STRL REUS W/TWL LRG LVL3 (GOWN DISPOSABLE) ×9
HANDPIECE INTERPULSE COAX TIP (DISPOSABLE)
HEMOSTAT POWDER SURGIFOAM 1G (HEMOSTASIS) IMPLANT
HEMOSTAT SURGICEL 2X14 (HEMOSTASIS) IMPLANT
KIT BASIN OR (CUSTOM PROCEDURE TRAY) ×3 IMPLANT
KIT TURNOVER KIT B (KITS) ×3 IMPLANT
NS IRRIG 1000ML POUR BTL (IV SOLUTION) ×3 IMPLANT
PACK GENERAL/GYN (CUSTOM PROCEDURE TRAY) ×3 IMPLANT
PAD ARMBOARD 7.5X6 YLW CONV (MISCELLANEOUS) ×6 IMPLANT
PLATE BONE LOCK TI 1.8 ST 10H (Plate) ×2 IMPLANT
PLATE BONE LOCK TI 1.8 ST 8H (Plate) ×2 IMPLANT
PLATE STERNAL 1.8MM THICK (Plate) ×2 IMPLANT
SCREW LOCKING TI 2.3X13MM (Screw) ×6 IMPLANT
SCREW STERNAL 2.3X17MM (Screw) ×26 IMPLANT
SCREW STERNAL LOCK 2.3MM (Screw) ×22 IMPLANT
SET HNDPC FAN SPRY TIP SCT (DISPOSABLE) IMPLANT
SOL PREP POV-IOD 4OZ 10% (MISCELLANEOUS) IMPLANT
SPONGE LAP 18X18 RF (DISPOSABLE) ×3 IMPLANT
STAPLER VISISTAT 35W (STAPLE) IMPLANT
SUT ETHILON 3 0 FSL (SUTURE) ×4 IMPLANT
SUT MNCRL AB 4-0 PS2 18 (SUTURE) ×4 IMPLANT
SUT PDS AB 1 CTX 36 (SUTURE) ×6 IMPLANT
SUT PDS AB 1 CTXB1 36 (SUTURE) ×4 IMPLANT
SUT PROLENE 2 0 MH 48 (SUTURE) IMPLANT
SUT STEEL 6MS V (SUTURE) ×2 IMPLANT
SUT VIC AB 2-0 CTX 27 (SUTURE) ×10 IMPLANT
SWAB COLLECTION DEVICE MRSA (MISCELLANEOUS) IMPLANT
SWAB CULTURE ESWAB REG 1ML (MISCELLANEOUS) IMPLANT
SYR 5ML LL (SYRINGE) IMPLANT
TOWEL GREEN STERILE (TOWEL DISPOSABLE) ×3 IMPLANT
TOWEL GREEN STERILE FF (TOWEL DISPOSABLE) ×3 IMPLANT
WATER STERILE IRR 1000ML POUR (IV SOLUTION) ×3 IMPLANT

## 2020-07-29 NOTE — Anesthesia Preprocedure Evaluation (Addendum)
Anesthesia Evaluation  Patient identified by MRN, date of birth, ID band Patient awake    Reviewed: Allergy & Precautions, NPO status , Patient's Chart, lab work & pertinent test results, reviewed documented beta blocker date and time   Airway Mallampati: II  TM Distance: >3 FB Neck ROM: Full    Dental  (+) Dental Advisory Given, Missing, Chipped   Pulmonary COPD, former smoker,    Pulmonary exam normal breath sounds clear to auscultation       Cardiovascular hypertension, Pt. on home beta blockers + angina + CAD, + Past MI, + Cardiac Stents, + CABG, +CHF and + DVT  Normal cardiovascular exam Rhythm:Regular Rate:Normal  Sternal wound    Neuro/Psych  Headaches, PSYCHIATRIC DISORDERS Anxiety Depression CVA    GI/Hepatic negative GI ROS, Neg liver ROS,   Endo/Other  negative endocrine ROS  Renal/GU negative Renal ROS     Musculoskeletal negative musculoskeletal ROS (+)   Abdominal   Peds  Hematology  (+) Blood dyscrasia, anemia ,   Anesthesia Other Findings Day of surgery medications reviewed with the patient.  Reproductive/Obstetrics                            Anesthesia Physical Anesthesia Plan  ASA: III  Anesthesia Plan: General   Post-op Pain Management:    Induction: Intravenous  PONV Risk Score and Plan: 2 and Dexamethasone and Ondansetron  Airway Management Planned: Oral ETT  Additional Equipment:   Intra-op Plan:   Post-operative Plan: Extubation in OR  Informed Consent: I have reviewed the patients History and Physical, chart, labs and discussed the procedure including the risks, benefits and alternatives for the proposed anesthesia with the patient or authorized representative who has indicated his/her understanding and acceptance.     Dental advisory given  Plan Discussed with: CRNA  Anesthesia Plan Comments: (2 large bore PIV)        Anesthesia Quick  Evaluation

## 2020-07-29 NOTE — Brief Op Note (Signed)
07/25/2020 - 07/29/2020  4:41 PM  PATIENT:  Daniel [name]  70 y.o. male  PRE-OPERATIVE DIAGNOSIS:  STERNAL NON-UNION  POST-OPERATIVE DIAGNOSIS:  STERNAL WOUND NON-UNION  PROCEDURE:   STERNAL RECONSTRUCTION  APPLICATION OF PREVENA NEGATIVE PRESSURE DRESSING  SURGEON: [name], MD   PHYSICIAN ASSISTANT: Jomarie Gellis  ANESTHESIA:   general  EBL:  500 mL   BLOOD ADMINISTERED:none  DRAINS: Blake drains x 2   LOCAL MEDICATIONS USED:  NONE  SPECIMEN:  No Specimen  DISPOSITION OF SPECIMEN:  N/A  COUNTS:  YES  DICTATION: .Dragon Dictation  PLAN OF CARE: Admit to inpatient   PATIENT DISPOSITION:  PACU - hemodynamically stable.   Delay start of Pharmacological VTE agent (>24hrs) due to surgical blood loss or risk of bleeding: yes

## 2020-07-29 NOTE — Progress Notes (Signed)
Returned from PACU.  A&Ox3..  Wife at bedside.  Vital signs taken.

## 2020-07-29 NOTE — Progress Notes (Signed)
Transported to short stay-prep for OR.  Telebox removed for transfer.  Wound vac intact.  Spouse at bedside

## 2020-07-29 NOTE — Transfer of Care (Signed)
Immediate Anesthesia Transfer of Care Note  Patient: Daniel [name]  Procedure(s) Performed: STERNAL RECONSTRUCTION (N/A ) APPLICATION OF WOUND VAC PREVENA (N/A )  Patient Location: PACU  Anesthesia Type:General  Level of Consciousness: awake, drowsy and patient cooperative  Airway & Oxygen Therapy: Patient Spontanous Breathing and Patient connected to face mask oxygen  Post-op Assessment: Report given to RN and Post -op Vital signs reviewed and stable  Post vital signs: Reviewed and stable  Last Vitals:  Vitals Value Taken Time  BP 161/68 07/29/20 1658  Temp    Pulse 95 07/29/20 1703  Resp 18 07/29/20 1703  SpO2 94 % 07/29/20 1703  Vitals shown include unvalidated device data.  Last Pain:  Vitals:   07/29/20 1134  TempSrc: Oral  PainSc:       Patients Stated Pain Goal: 2 (92/95/74 7340)  Complications: No complications documented.

## 2020-07-29 NOTE — H&P (Signed)
History and Physical Interval Note:  07/29/2020 1:40 PM  Daniel [name]  has presented today for surgery, with the diagnosis of STERNAL WOUND.  The various methods of treatment have been discussed with the patient and family. After consideration of risks, benefits and other options for treatment, the patient has consented to  Procedure(s): STERNAL RECONSTRUCTION (N/A) possible APPLICATION OF WOUND VAC (N/A) as a surgical intervention.  The patient's history has been reviewed, patient examined, no change in status, stable for surgery.  I have reviewed the patient's chart and labs.  Questions were answered to the patient's satisfaction.     [name]

## 2020-07-29 NOTE — Progress Notes (Signed)
      [address]       [place] [address]             [phone]      4 Days Post-Op Procedure(s) (LRB): sternal hardware removal and debridement (N/A)\  Subjective:  Patient is frustrated.  States his bed sheets and things are all over the place.  He continues to have pain along top portion of sternum, however lidocaine patch did provide some relief   Objective: Vital signs in last 24 hours: Temp:  [97.7 F (36.5 C)-98.4 F (36.9 C)] 97.8 F (36.6 C) (03/22 0233) Pulse Rate:  [61-91] 91 (03/22 0233) Cardiac Rhythm: Normal sinus rhythm (03/21 2000) Resp:  [17-20] 20 (03/22 0233) BP: (124-169)/(72-87) 124/76 (03/22 0233) SpO2:  [96 %-100 %] 96 % (03/22 0233)  Intake/Output from previous day: 03/21 0701 - 03/22 0700 In: -  Out: 850 [Urine:775; Drains:75]  General appearance: alert, cooperative and no distress Heart: regular rate and rhythm Lungs: clear to auscultation bilaterally Abdomen: soft, non-tender; bowel sounds normal; no masses,  no organomegaly Extremities: extremities normal, atraumatic, no cyanosis or edema Wound: wound vac in place  Lab Results: No results for input(s): WBC, HGB, HCT, PLT in the last 72 hours. BMET: Recent Labs    07/28/20 0228 07/29/20 0141  NA 140 136  K 4.0 4.1  CL 108 101  CO2 28 26  GLUCOSE 101* 108*  BUN 15 16  CREATININE 0.84 0.95  CALCIUM 8.5* 9.0    PT/INR: No results for input(s): LABPROT, INR in the last 72 hours. ABG    Component Value Date/Time   PHART 7.391 07/24/2020 1410   HCO3 23.4 07/24/2020 1410   TCO2 23 02/01/2020 0102   ACIDBASEDEF 0.9 07/24/2020 1410   O2SAT 97.0 07/24/2020 1410   CBG (last 3)  No results for input(s): GLUCAP in the last 72 hours.  Assessment/Plan: S/P Procedure(s) (LRB): sternal hardware removal and debridement (N/A)  1. CV- hemodynamically stable 2. ID-OR cultures remain negative to date, afebrile, no leukocytosis 3. Dispo- patient to OR this afternoon for  sternal reconstruction and wound vac change   LOS: 4 days    Ellwood Handler, PA-C  07/29/2020

## 2020-07-29 NOTE — Anesthesia Procedure Notes (Signed)
Procedure Name: Intubation Date/Time: 07/29/2020 2:43 PM Performed by: Thelma Comp, CRNA Pre-anesthesia Checklist: Patient identified, Emergency Drugs available, Suction available and Patient being monitored Patient Re-evaluated:Patient Re-evaluated prior to induction Oxygen Delivery Method: Circle System Utilized Preoxygenation: Pre-oxygenation with 100% oxygen Induction Type: IV induction Ventilation: Mask ventilation without difficulty Laryngoscope Size: Mac and 4 Grade View: Grade I Tube type: Oral Tube size: 8.0 mm Number of attempts: 1 Airway Equipment and Method: Stylet Placement Confirmation: ETT inserted through vocal cords under direct vision,  positive ETCO2 and breath sounds checked- equal and bilateral Secured at: 23 cm Tube secured with: Tape Dental Injury: Teeth and Oropharynx as per pre-operative assessment

## 2020-07-29 NOTE — Anesthesia Postprocedure Evaluation (Signed)
Anesthesia Post Note  Patient: Daniel [name]  Procedure(s) Performed: STERNAL RECONSTRUCTION (N/A ) APPLICATION OF WOUND VAC PREVENA (N/A )     Patient location during evaluation: PACU Anesthesia Type: General Level of consciousness: awake and alert Pain management: pain level controlled Vital Signs Assessment: post-procedure vital signs reviewed and stable Respiratory status: spontaneous breathing, nonlabored ventilation, respiratory function stable and patient connected to nasal cannula oxygen Cardiovascular status: blood pressure returned to baseline and stable Postop Assessment: no apparent nausea or vomiting Anesthetic complications: no   No complications documented.  Last Vitals:  Vitals:   07/29/20 1803 07/29/20 1957  BP: (!) 142/74 125/89  Pulse: 85 100  Resp: 19 17  Temp: 36.7 C 36.5 C  SpO2: 98% 99%    Last Pain:  Vitals:   07/29/20 2029  TempSrc:   PainSc: Asleep                 Catalina Gravel

## 2020-07-30 ENCOUNTER — Inpatient Hospital Stay (HOSPITAL_COMMUNITY): Payer: No Typology Code available for payment source

## 2020-07-30 ENCOUNTER — Encounter (HOSPITAL_COMMUNITY): Payer: Self-pay | Admitting: Cardiothoracic Surgery

## 2020-07-30 LAB — AEROBIC/ANAEROBIC CULTURE W GRAM STAIN (SURGICAL/DEEP WOUND): Gram Stain: NONE SEEN

## 2020-07-30 NOTE — Progress Notes (Addendum)
      [address]       [place] [address]             [phone]      1 Day Post-Op Procedure(s) (LRB): STERNAL RECONSTRUCTION (N/A) APPLICATION OF WOUND VAC PREVENA (N/A)   Subjective:  Patient doing better.  Pain is improved.    Objective: Vital signs in last 24 hours: Temp:  [97.3 F (36.3 C)-98.4 F (36.9 C)] 97.9 F (36.6 C) (03/23 0720) Pulse Rate:  [72-100] 87 (03/23 0720) Cardiac Rhythm: Normal sinus rhythm (03/23 0448) Resp:  [14-21] 16 (03/23 0720) BP: (106-161)/(58-89) 106/81 (03/23 0720) SpO2:  [92 %-100 %] 97 % (03/23 0720)  Intake/Output from previous day: 03/22 0701 - 03/23 0700 In: 1300 [P.O.:500; I.V.:800] Out: 1400 [Urine:700; Drains:200; Blood:500]  General appearance: alert, cooperative and no distress Heart: regular rate and rhythm Lungs: clear to auscultation bilaterally Abdomen: soft, non-tender; bowel sounds normal; no masses,  no organomegaly Extremities: extremities normal, atraumatic, no cyanosis or edema Wound: pravena in place over sternum  Lab Results: No results for input(s): WBC, HGB, HCT, PLT in the last 72 hours. BMET: Recent Labs    07/28/20 0228 07/29/20 0141  NA 140 136  K 4.0 4.1  CL 108 101  CO2 28 26  GLUCOSE 101* 108*  BUN 15 16  CREATININE 0.84 0.95  CALCIUM 8.5* 9.0    PT/INR: No results for input(s): LABPROT, INR in the last 72 hours. ABG    Component Value Date/Time   PHART 7.391 07/24/2020 1410   HCO3 23.4 07/24/2020 1410   TCO2 23 02/01/2020 0102   ACIDBASEDEF 0.9 07/24/2020 1410   O2SAT 97.0 07/24/2020 1410   CBG (last 3)  No results for input(s): GLUCAP in the last 72 hours.  Assessment/Plan: S/P Procedure(s) (LRB): STERNAL RECONSTRUCTION (N/A) APPLICATION OF WOUND VAC PREVENA (N/A)  1. CV- NSR with occasional PVCs- Lopressor, Zestril 2. Pulm- no acute issues, off oxygen, continue IS 3. ID- remains afebrile, no leukocytosis, OR cultures with no growth to date 4. Dispo-  patient stable, will monitor today, if JP drain output remains low will plan to d/c home tomorrow with pravena in place   LOS: 5 days    Ellwood Handler, PA-C 07/30/2020 Pt seen and examined; agree with documentation. Ronasia Isola Z. Orvan Seen, [place]

## 2020-07-30 NOTE — Op Note (Signed)
Procedure(s): STERNAL RECONSTRUCTION APPLICATION OF WOUND VAC PREVENA Procedure Note  Daniel [name] male 70 y.o. 07/29/20 Procedure(s) and Anesthesia Type:    * STERNAL RECONSTRUCTION - General    * APPLICATION OF WOUND VAC PREVENA - General    * bilateral pectoralis major advancement flaps  Surgeon(s) and Role:    * [name], MD - Primary   Indications: The patient was admitted to the hospital with sternal dehiscence status post CABG.  He did well initially but contracted Covid a few months after surgery and due to frequent coughing eventually experience sternal instability and work-up demonstrated dehisced sternum.  He has been treated with a wound VAC for several days and now presents for rigid reconstruction. Surgeon: [name]   Assistants: Macarthur Critchley, PA-C  Anesthesia: General endotracheal anesthesia  ASA Class: 3    Procedure Detail  STERNAL RECONSTRUCTION, APPLICATION OF WOUND VAC [place]; bilateral pectoralis major advancement flaps  After informed consent, he was brought to the operating room on the above listed date.  Anesthesia was begun in a smooth fashion by the general endotracheal technique.  After confirming adequate anesthesia the anterior chest and abdomen were cleansed and draped in a sterile field using Betadine paint and soap.  A preop surgical pause was performed. The mediastinum was copiously irrigated.  Periosteal debris was removed mechanically with a curette.  Bilateral pectoralis major advancement flaps were created by dissecting the pectoralis major muscles off of the chest wall.  The sternum was then reapproximated in the manubrial area and the inferior aspect of the sternum with stainless steel wires.  The midportion of the sternum is stabilized with titanium plating system going transversely.  The pec advancement flaps were then secured one side to the other with interrupted sutures of #1 PDS.  The remainder of the soft tissue was  closed in layers.  A Prevena incisional management system was placed in the closed incision as a sterile dressing.  This concluded the procedure.  Estimated Blood Loss:  300 mL         Drains: Two 10 [place] JP drains   Blood Given: none          Specimens: None         Implants: Titanium plating system        Complications:  * No complications entered in OR log *         Disposition: PACU - hemodynamically stable.         Condition: stable

## 2020-07-30 NOTE — Progress Notes (Signed)
Mobility Specialist: Progress Note   07/30/20 1522  Mobility  Activity Ambulated in hall  Level of Assistance Modified independent, requires aide device or extra time  Assistive Device Front wheel walker  Distance Ambulated (ft) 810 ft  Mobility Response Tolerated well  Mobility performed by Mobility specialist  $Mobility charge 1 Mobility   Post-Mobility: 79 HR, 113/63 BP, 96% SpO2  Pt asx during ambulation. Pt back to bed after walk with call bell at his side.   [facility] Elizebeth Kluesner Mobility Specialist Mobility Specialist Phone: [phone]

## 2020-07-31 DIAGNOSIS — T84218A Breakdown (mechanical) of internal fixation device of other bones, initial encounter: Secondary | ICD-10-CM

## 2020-07-31 MED ORDER — JUVEN PO PACK
1.0000 | PACK | Freq: Two times a day (BID) | ORAL | Status: DC
  Administered 2020-08-01: 1 via ORAL
  Filled 2020-07-31 (×3): qty 1

## 2020-07-31 MED ORDER — PROSOURCE PLUS PO LIQD
30.0000 mL | Freq: Two times a day (BID) | ORAL | Status: DC
  Administered 2020-08-01 – 2020-08-02 (×2): 30 mL via ORAL
  Filled 2020-07-31 (×3): qty 30

## 2020-07-31 MED ORDER — POLYETHYLENE GLYCOL 3350 17 G PO PACK: 17.0000 g | PACK | Freq: Every day | ORAL | Status: DC

## 2020-07-31 MED ORDER — KETOROLAC TROMETHAMINE 15 MG/ML IJ SOLN
7.5000 mg | Freq: Four times a day (QID) | INTRAMUSCULAR | Status: AC
  Administered 2020-07-31 – 2020-08-01 (×5): 7.5 mg via INTRAVENOUS
  Filled 2020-07-31 (×5): qty 1

## 2020-07-31 MED ORDER — TICAGRELOR 90 MG PO TABS
90.0000 mg | ORAL_TABLET | Freq: Two times a day (BID) | ORAL | Status: DC
  Administered 2020-07-31 – 2020-08-03 (×7): 90 mg via ORAL
  Filled 2020-07-31 (×7): qty 1

## 2020-07-31 MED ORDER — DIAZEPAM 2 MG PO TABS
2.0000 mg | ORAL_TABLET | Freq: Three times a day (TID) | ORAL | Status: AC
  Administered 2020-07-31 – 2020-08-02 (×6): 2 mg via ORAL
  Filled 2020-07-31 (×6): qty 1

## 2020-07-31 NOTE — Progress Notes (Signed)
Mobility Specialist: Progress Note   07/31/20 1711  Mobility  Activity Ambulated in hall  Level of Assistance Independent  Assistive Device None  Distance Ambulated (ft) 470 ft  Mobility Response Tolerated well  Mobility performed by Mobility specialist  $Mobility charge 1 Mobility   Post-Mobility: 76 HR  Pt asx during ambulation. Pt was able to walk w/o assistive device this time and said he felt fine. Pt back to bed after walk.   [facility] Daniel [name] Mobility Specialist Mobility Specialist Phone: [phone]
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
## 2020-07-31 NOTE — Progress Notes (Signed)
Patient c/o severe headache (frontal and posterior base of head). Patient denied any history of migraines. Patient did not want tylenol as it would not touch it. Patient given morphine and a cold cloth. Patient asleep when RN went to check on him. Will continue to monitor

## 2020-07-31 NOTE — Progress Notes (Addendum)
      [address]       [place] [address]             [phone]      2 Days Post-Op Procedure(s) (LRB): STERNAL RECONSTRUCTION (N/A) APPLICATION OF WOUND VAC PREVENA (N/A)   Subjective:  Patient had a rough night.  He had pain in his sternum and a migraine headache.  He doesn't think he can go home in his current state.  He noticed pain when he was up and trying to get out of bed.  He was pushing on the bed.  I educated the patient that he is back on sternal precautions like he was post coronary bypass grafting.    Objective: Vital signs in last 24 hours: Temp:  [97.5 F (36.4 C)-98.2 F (36.8 C)] 97.5 F (36.4 C) (03/24 0329) Pulse Rate:  [60-72] 60 (03/24 0329) Cardiac Rhythm: Normal sinus rhythm (03/23 2130) Resp:  [15-17] 15 (03/24 0329) BP: (115-143)/(61-96) 138/81 (03/24 0329) SpO2:  [97 %-100 %] 100 % (03/24 0329)  Intake/Output from previous day: 03/23 0701 - 03/24 0700 In: 0  Out: 1530 [Urine:1475; Drains:55]  General appearance: alert, cooperative and no distress Heart: regular rate and rhythm Lungs: clear to auscultation bilaterally Abdomen: soft, non-tender; bowel sounds normal; no masses,  no organomegaly Extremities: extremities normal, atraumatic, no cyanosis or edema Wound: pravena in place, no output from wound vac, minimal output JP bulbs  Lab Results: No results for input(s): WBC, HGB, HCT, PLT in the last 72 hours. BMET: Recent Labs    07/29/20 0141  NA 136  K 4.1  CL 101  CO2 26  GLUCOSE 108*  BUN 16  CREATININE 0.95  CALCIUM 9.0    PT/INR: No results for input(s): LABPROT, INR in the last 72 hours. ABG    Component Value Date/Time   PHART 7.391 07/24/2020 1410   HCO3 23.4 07/24/2020 1410   TCO2 23 02/01/2020 0102   ACIDBASEDEF 0.9 07/24/2020 1410   O2SAT 97.0 07/24/2020 1410   CBG (last 3)  No results for input(s): GLUCAP in the last 72 hours.  Assessment/Plan: S/P Procedure(s) (LRB): STERNAL  RECONSTRUCTION (N/A) APPLICATION OF WOUND VAC PREVENA (N/A)  1. CV- NSR with PVCs, BP stable- on Lopressor, Lisinopril 2. Pulm- no acute issues, continue IS 3. ID- afebrile, no growth from OR cultures 4. Pain control- instructed patient to try and use oral medications, as he can not be discharged home on IV morphine.  I told him we can adjust his oral regimen as needed to obtain good pain control 5. Deconditioning-I suspect a lot of patients discomfort overnight, was due to not observing sternal precautions, I stressed importance of this as he has a high likely hood of re-injuring his sternum if he doesn't. Will get PT consult to help patient with mobility and tips to get up and down without undue stress on sternum 6. Dispo- patient stable, not ready for d/c home today, will work on pain control and mobility with PT consult, JP output low 55 cc yesterday   LOS: 6 days    Ellwood Handler, PA-C 07/31/2020

## 2020-07-31 NOTE — Progress Notes (Signed)
Nutrition Follow Up  DOCUMENTATION CODES:   Not applicable  INTERVENTION:    ProSource Plus 30 ml BID, each supplement provides 100 kcals and 15 grams protein.   Juven Fruit Punch BID, each serving provides 95kcal and 2.5g of protein (amino acids glutamine and arginine)  NUTRITION DIAGNOSIS:   Increased nutrient needs related to wound healing as evidenced by estimated needs.  Ongoing  GOAL:   Patient will meet greater than or equal to 90% of their needs   Meeting   MONITOR:   PO intake,Supplement acceptance,Weight trends,I & O's  REASON FOR ASSESSMENT:   Malnutrition Screening Tool    ASSESSMENT:   70 year old male who presented on 3/18 for sternal hardware removal and debridement due to diagnosis of sternal dehiscence. PMH of CVA, carotid stenosis, CAD s/p CABG in September 2021, HTN, HLD, CHF.   3/18 - s/p sternal hardware removal and debridement, wound VAC placed 3/22 - s/p sternal reconstruction, wound VAC placed  Patient had sternal pain last night. Continue to require IV pain medication. Appetite looks to be stable. Last four meal completions charted as 100%. Not taking Ensure. Will change supplement given poor adherence.   Admission weight: 92.5 kg (no current weight)  UOP: 1475 ml x 24 hrs  JP drains- 55 ml x 24 hrs   Medications: miralax Labs: CBG 101-164  Diet Order:   Diet Order            Diet Heart Room service appropriate? Yes; Fluid consistency: Thin  Diet effective now                 EDUCATION NEEDS:   No education needs have been identified at this time  Skin:  Skin Assessment:  Skin Integrity Issues: Wound VAC: chest  Last BM:  3/23  Height:   Ht Readings from Last 1 Encounters:  07/25/20 5\' 11"  (1.803 m)    Weight:   Wt Readings from Last 1 Encounters:  07/25/20 92.5 kg    BMI:  Body mass index is 28.44 kg/m.  Estimated Nutritional Needs:   Kcal:  2200-2400  Protein:  115-130 grams  Fluid:  >/= 2.0  L  Mariana Single RD, LDN Clinical Nutrition Pager listed in [place]

## 2020-07-31 NOTE — Progress Notes (Signed)
Pt walked with PT and had large amount of serosanguinous fluid drain out from around JP drains. Gauze changed and Nicholes Rough PA-C paged. Order received to continue to monitor and Chest XRAY ordered for tomorrow morning. Call light in reach.  Clyde Canterbury, RN

## 2020-07-31 NOTE — Progress Notes (Signed)
Patient called out c/o pain. Patient stated that, "the morphine helped earlier". Patient c/o pain 6 for his headache. And pain 6 for his sternal site. Patient stated that, "his sternal site feels like the skin is pulling". This RN barely slid his gown up to look at the dressing and patient flinched with pain". Site looks good. Minimal drainage out of JP drains.   Patient stated that he hurt so much while trying to get up and use the urinal. Instructed the patient to call for help if he felt that he was unable to do so himself. Informed the patient that he needed to ask for help until he is stronger and does not undo what repair he had done. Patient voiced understanding.    Patient voiced that he wasn't sure if he wanted to go home feeling and hurting like he is. Told patient to talk to MD in the morning during rounds. Will continue to monitor

## 2020-07-31 NOTE — Plan of Care (Signed)
  Problem: Health Behavior/Discharge Planning: Goal: Ability to manage health-related needs will improve Outcome: Progressing   

## 2020-07-31 NOTE — Progress Notes (Signed)
      [address]       [place] [address]             [phone]       Large amount of serosanguinous pleural fluid which drained from around the chest bulbs during his walk today. He otherwise feels okay. Will order a CXR for the morning to r/o large pleural effusion.   Nicholes Rough, PA-C

## 2020-07-31 NOTE — Progress Notes (Signed)
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  Pt s/p hardware removal 3/18, sternal reconstruction and wound VAC placement 3/22.  PMHx  PE, DVT, DVA, CAD, COPD  Clinical Impression  Pt admitted with/for sternal dehiscence s/p reconstruction.  Pt mobilizing at supervision level, but frequently breaking sternal precautions..  Pt currently limited functionally due to the problems listed below.  (see problems list.)  Pt will benefit from PT to maximize function and safety to be able to get home safely with available assist .     Follow Up Recommendations No PT follow up    Equipment Recommendations  None recommended by PT    Recommendations for Other Services       Precautions / Restrictions Precautions Precautions: Sternal      Mobility  Bed Mobility Overal bed mobility: Needs Assistance Bed Mobility: Supine to Sit;Sit to Supine     Supine to sit: Supervision Sit to supine: Supervision   General bed mobility comments: Before able to cue for sternal prec, pt quick to use UE's to scoot to EOB and boost.  Took time to instruct and later reinforce sternal precautions.    Transfers Overall transfer level: Needs assistance   Transfers: Sit to/from Stand Sit to Stand: Supervision         General transfer comment: supervision with cues for "not using UE's to boost.  Ambulation/Gait Ambulation/Gait assistance: Supervision Gait Distance (Feet): 400 Feet Assistive device: Rolling walker (2 wheeled);None Gait Pattern/deviations: Step-through pattern   Gait velocity interpretation: 1.31 - 2.62 ft/sec, indicative of limited community ambulator General Gait Details: pt steady without AD with no deviation  with balance challenge.  Stairs Stairs: Yes Stairs assistance: Min guard Stair Management: One rail Left;Step to pattern;Alternating pattern;Forwards Number of Stairs: 5 General stair comments: slow with mild effort, but safe with rail  Wheelchair Mobility    Modified Rankin (Stroke Patients Only)       Balance                                             Pertinent Vitals/Pain Pain Assessment: Faces Faces Pain Scale: Hurts a little bit Pain Location: sternal Pain Descriptors / Indicators: Burning;Sore Pain Intervention(s): Monitored during session    Home Living Family/patient expects to be discharged to:: Private residence Living Arrangements: Spouse/significant other Available Help at Discharge: Family;Available PRN/intermittently (wife and son) Type of Home: House Home Access: Level entry     Home Layout: One level Home Equipment: None      Prior Function Level of Independence: Independent               Hand Dominance        Extremity/Trunk Assessment   Upper Extremity Assessment Upper Extremity Assessment: Overall WFL for tasks assessed    Lower Extremity Assessment Lower Extremity Assessment: Overall WFL for tasks assessed       Communication   Communication: No difficulties  Cognition Arousal/Alertness: Awake/alert Behavior During Therapy: WFL for tasks assessed/performed;Restless Overall Cognitive Status: Within Functional Limits for tasks assessed  General Comments General comments (skin integrity, edema, etc.): Mobility safe, emphasis on reinforcing importance of sternal precautions and what they mean for him during mobility.    Exercises     Assessment/Plan    PT Assessment Patent does not need any further PT services  PT Problem List         PT Treatment Interventions      PT Goals (Current goals can be found in the Care Plan section)  Acute Rehab  PT Goals Patient Stated Goal: Home, healed, independent PT Goal Formulation: With patient Time For Goal Achievement: 08/07/20 Potential to Achieve Goals: Good    Frequency     Barriers to discharge        Co-evaluation               AM-PAC PT "6 Clicks" Mobility  Outcome Measure Help needed turning from your back to your side while in a flat bed without using bedrails?: A Little Help needed moving from lying on your back to sitting on the side of a flat bed without using bedrails?: A Little Help needed moving to and from a bed to a chair (including a wheelchair)?: A Little Help needed standing up from a chair using your arms (e.g., wheelchair or bedside chair)?: A Little Help needed to walk in hospital room?: A Little Help needed climbing 3-5 steps with a railing? : A Little 6 Click Score: 18    End of Session   Activity Tolerance: Patient tolerated treatment well Patient left: in bed;with call bell/phone within reach Nurse Communication: Mobility status PT Visit Diagnosis: Other abnormalities of gait and mobility (R26.89);Difficulty in walking, not elsewhere classified (R26.2)    Time: 2956-2130 PT Time Calculation (min) (ACUTE ONLY): 24 min   Charges:   PT Evaluation $PT Eval Moderate Complexity: 1 Mod PT Treatments $Gait Training: 8-22 mins        07/31/2020  Ginger Carne., PT Acute Rehabilitation Services [phone]  (pager) [phone]  (office)  Daniel [name] 07/31/2020, 2:51 PM

## 2020-08-01 ENCOUNTER — Inpatient Hospital Stay (HOSPITAL_COMMUNITY): Payer: No Typology Code available for payment source

## 2020-08-01 ENCOUNTER — Inpatient Hospital Stay: Payer: Self-pay

## 2020-08-01 DIAGNOSIS — A498 Other bacterial infections of unspecified site: Secondary | ICD-10-CM | POA: Diagnosis not present

## 2020-08-01 DIAGNOSIS — T8132XA Disruption of internal operation (surgical) wound, not elsewhere classified, initial encounter: Secondary | ICD-10-CM

## 2020-08-01 DIAGNOSIS — M869 Osteomyelitis, unspecified: Secondary | ICD-10-CM

## 2020-08-01 LAB — SEDIMENTATION RATE: Sed Rate: 27 mm/hr — ABNORMAL HIGH (ref 0–16)

## 2020-08-01 MED ORDER — SODIUM CHLORIDE 0.9% FLUSH
10.0000 mL | Freq: Two times a day (BID) | INTRAVENOUS | Status: DC
  Administered 2020-08-01 – 2020-08-03 (×3): 10 mL

## 2020-08-01 MED ORDER — SODIUM CHLORIDE 0.9% FLUSH: 10.0000 mL | INTRAVENOUS | Status: DC | PRN

## 2020-08-01 MED ORDER — SODIUM CHLORIDE 0.9 % IV SOLN
2.0000 g | INTRAVENOUS | Status: DC
  Administered 2020-08-01 – 2020-08-03 (×3): 2 g via INTRAVENOUS
  Filled 2020-08-01: qty 2
  Filled 2020-08-01 (×2): qty 20

## 2020-08-01 MED ORDER — CHLORHEXIDINE GLUCONATE CLOTH 2 % EX PADS
6.0000 | MEDICATED_PAD | Freq: Every day | CUTANEOUS | Status: DC
  Administered 2020-08-01 – 2020-08-03 (×3): 6 via TOPICAL

## 2020-08-01 MED ORDER — TRAMADOL HCL 50 MG PO TABS
50.0000 mg | ORAL_TABLET | ORAL | Status: DC | PRN
  Administered 2020-08-01 – 2020-08-03 (×9): 50 mg via ORAL
  Filled 2020-08-01 (×9): qty 1

## 2020-08-01 NOTE — Progress Notes (Signed)
Peripherally Inserted Central Catheter Placement  The IV Nurse has discussed with the patient and/or persons authorized to consent for the patient, the purpose of this procedure and the potential benefits and risks involved with this procedure.  The benefits include less needle sticks, lab draws from the catheter, and the patient may be discharged home with the catheter. Risks include, but not limited to, infection, bleeding, blood clot (thrombus formation), and puncture of an artery; nerve damage and irregular heartbeat and possibility to perform a PICC exchange if needed/ordered by physician.  Alternatives to this procedure were also discussed.  Bard Power PICC patient education guide, fact sheet on infection prevention and patient information card has been provided to patient /or left at bedside.    PICC Placement Documentation  PICC Single Lumen 01/41/03 PICC Right Basilic 38 cm 0 cm (Active)  Indication for Insertion or Continuance of Line Home intravenous therapies (PICC only) 08/01/20 2106  Exposed Catheter (cm) 0 cm 08/01/20 2106  Site Assessment Clean;Dry;Intact 08/01/20 2106  Line Status Flushed;Saline locked;Blood return noted 08/01/20 2106  Dressing Type Transparent 08/01/20 2106  Dressing Status Clean;Dry;Intact 08/01/20 2106  Antimicrobial disc in place? Yes 08/01/20 2106  Dressing Intervention New dressing 08/01/20 2106  Dressing Change Due 08/08/20 08/01/20 2106       Gordan Payment 08/01/2020, 9:07 PM

## 2020-08-01 NOTE — Progress Notes (Signed)
PHARMACY CONSULT NOTE FOR:  OUTPATIENT  PARENTERAL ANTIBIOTIC THERAPY (OPAT)  Indication: Osteomyelitis Regimen: Ceftriaxone 2g IV daily End date: 09/12/20  IV antibiotic discharge orders are pended. To discharging provider:  please sign these orders via discharge navigator,  Select New Orders & click on the button choice - Manage This Unsigned Work.     Thank you for allowing pharmacy to be a part of this patient's care.  Claudina Lick, PharmD PGY1 Acute Care Pharmacy Resident 08/01/2020 12:48 PM  Please check [email or web address] for unit-specific pharmacy phone numbers.

## 2020-08-01 NOTE — Plan of Care (Signed)
  Problem: Clinical Measurements: Goal: Will remain free from infection Outcome: Progressing Goal: Respiratory complications will improve Outcome: Progressing Goal: Cardiovascular complication will be avoided Outcome: Progressing   

## 2020-08-01 NOTE — Progress Notes (Signed)
Physical Therapy Treatment Patient Details Name: Daniel [name] MRN: [id number] DOB: [birth year] Today's Date: 08/01/2020    History of Present Illness pt is a 70 y/o male admitted 3/18 with sternal dehyscience from a CABG completed this past fall with Covid coughing months later that led at least in part to the nonunion. Pt s/p hardware removal 3/18, sternal reconstruction and wound VAC placement 3/22.  PMHx  PE, DVT, DVA, CAD, COPD.    PT Comments    Pt received in supine, agreeable to therapy session and with good participation and tolerance for mobility. Pt seen earlier in day ambulating in hallway and at time of session deferring gait trial so instead focus on safety/compliance with sternal precautions, pt given handout and able to perform with min guard, but needs multimodal cues for technique and continues to ignore some therapist cues, causing increased pain at incision site. Will continue to follow to reinforce safe mobility within precautions, he would benefit from family training with spouse in future sessions to ensure compliance upon discharge. Frequency updated per discussion with supervising PT Bunnie Philips. Pt continues to benefit from PT services to progress toward functional mobility goals.  Follow Up Recommendations  No PT follow up     Equipment Recommendations  None recommended by PT    Recommendations for Other Services       Precautions / Restrictions Precautions Precautions: Sternal Precaution Booklet Issued: Yes (comment) Precaution Comments: sternal and sternal HEP handouts given Restrictions Weight Bearing Restrictions: No Other Position/Activity Restrictions: sternal precs    Mobility  Bed Mobility Overal bed mobility: Needs Assistance Bed Mobility: Rolling;Sidelying to Sit;Sit to Sidelying Rolling: Supervision Sidelying to sit: Min guard     Sit to sidelying: Min guard General bed mobility comments: went through sequencing with pt verbal/visual demo,  then step by step reviewed with him but he needed hand over hand assist to perform safely and still tending to extend B elbows behind himself in order to reposition trunk in bed despite cues/demo not to.    Transfers                 General transfer comment: pt deferring to attempt 2/2 fatigue  Ambulation/Gait                 Stairs             Wheelchair Mobility    Modified Rankin (Stroke Patients Only)       Balance                                            Cognition Arousal/Alertness: Awake/alert Behavior During Therapy: WFL for tasks assessed/performed Overall Cognitive Status: Within Functional Limits for tasks assessed                                 General Comments: pt receptive to instruction regarding sternal precautions but unable to recall/teachback precautions when prompted at beginning of session and with poor carryover of instructions given when performing bed mobility. Pt given handout for reinforcement but would help to have family present to ensure safety with techniques.      Exercises General Exercises - Lower Extremity Ankle Circles/Pumps: AROM;Both;10 reps;Supine Quad Sets: AROM;Both;5 reps;Supine Hip ABduction/ADduction: AROM;Both;5 reps;Supine    General Comments General comments (skin integrity, edema, etc.): session  focus on reviewing supine HEP and safety/compliance with sternal precautions for bed mobility      Pertinent Vitals/Pain Pain Assessment: 0-10 Pain Score: 5  Pain Location: sternal Pain Descriptors / Indicators: Burning;Sore Pain Intervention(s): Monitored during session;Repositioned;Premedicated before session;Limited activity within patient's tolerance    Home Living                      Prior Function            PT Goals (current goals can now be found in the care plan section) Acute Rehab PT Goals Patient Stated Goal: Home, healed, independent PT Goal  Formulation: With patient Time For Goal Achievement: 08/07/20 Potential to Achieve Goals: Good Progress towards PT goals: Progressing toward goals    Frequency    Min 3X/week      PT Plan Current plan remains appropriate    Co-evaluation              AM-PAC PT "6 Clicks" Mobility   Outcome Measure  Help needed turning from your back to your side while in a flat bed without using bedrails?: A Little Help needed moving from lying on your back to sitting on the side of a flat bed without using bedrails?: A Little Help needed moving to and from a bed to a chair (including a wheelchair)?: A Little Help needed standing up from a chair using your arms (e.g., wheelchair or bedside chair)?: A Little Help needed to walk in hospital room?: A Little Help needed climbing 3-5 steps with a railing? : A Little 6 Click Score: 18    End of Session   Activity Tolerance: Patient limited by fatigue Patient left: in bed;with call bell/phone within reach Nurse Communication: Mobility status PT Visit Diagnosis: Other abnormalities of gait and mobility (R26.89);Difficulty in walking, not elsewhere classified (R26.2)     Time: 7414-2395 PT Time Calculation (min) (ACUTE ONLY): 22 min  Charges:  $Therapeutic Activity: 8-22 mins                     Kairee Isa P., PTA Acute Rehabilitation Services Pager: [phone] Office: [place] 08/01/2020, 5:37 PM
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
## 2020-08-01 NOTE — Progress Notes (Signed)
      [address]       [place] [address]             [phone]      3 Days Post-Op Procedure(s) (LRB): STERNAL RECONSTRUCTION (N/A) APPLICATION OF WOUND VAC PREVENA (N/A)   Subjective:  Patient states he isn't doing well.  He states he again had a rough night.  He had a coughing episode which has made him have sternal pain.  Objective: Vital signs in last 24 hours: Temp:  [97.7 F (36.5 C)-98.7 F (37.1 C)] 97.7 F (36.5 C) (03/25 0526) Pulse Rate:  [63-85] 76 (03/25 0526) Cardiac Rhythm: Normal sinus rhythm (03/24 1900) Resp:  [15-20] 20 (03/25 0526) BP: (111-144)/(46-82) 144/82 (03/25 0526) SpO2:  [97 %-99 %] 99 % (03/25 0526) Weight:  [87.2 kg] 87.2 kg (03/25 0526)  Intake/Output from previous day: 03/24 0701 - 03/25 0700 In: 240 [P.O.:240] Out: 490 [Urine:475; Drains:15]  General appearance: alert, cooperative and no distress Heart: regular rate and rhythm Lungs: clear to auscultation bilaterally Abdomen: soft, non-tender; bowel sounds normal; no masses,  no organomegaly Extremities: extremities normal, atraumatic, no cyanosis or edema Wound: clean and dry, serous drainage from around JP drain sites  Lab Results: No results for input(s): WBC, HGB, HCT, PLT in the last 72 hours. BMET: No results for input(s): NA, K, CL, CO2, GLUCOSE, BUN, CREATININE, CALCIUM in the last 72 hours.  PT/INR: No results for input(s): LABPROT, INR in the last 72 hours. ABG    Component Value Date/Time   PHART 7.391 07/24/2020 1410   HCO3 23.4 07/24/2020 1410   TCO2 23 02/01/2020 0102   ACIDBASEDEF 0.9 07/24/2020 1410   O2SAT 97.0 07/24/2020 1410   CBG (last 3)  No results for input(s): GLUCAP in the last 72 hours.  Assessment/Plan: S/P Procedure(s) (LRB): STERNAL RECONSTRUCTION (N/A) APPLICATION OF WOUND VAC PREVENA (N/A)  1. CV- hemodynamically stable, Lopressor, Zestril 2. Pulm- no acute issues, CXR is stable, JP drain 15cc output, serous output  leaking from around JP drain sites 3. ID- afebrile, OR culture growing rare Propionibacterium Acnes- will discuss management with Dr. Orvan Seen, may need ID consult 4. Pain control- patient continues to have issues with pain.  He thinks PT helped with education and tips to get him up and down better 5. Dispo- patient stable, pain continues to be an issues, OR cultures did grow a bacteria will discuss with Dr. Orvan Seen, may need ID consult   LOS: 7 days    Ellwood Handler, PA-C 08/01/2020

## 2020-08-02 NOTE — Progress Notes (Addendum)
      [address]       [place] [address]             [phone]      4 Days Post-Op Procedure(s) (LRB): STERNAL RECONSTRUCTION (N/A) APPLICATION OF WOUND VAC PREVENA (N/A)   Subjective:  Patient doing better today.  He is up at the sink cleaning himself up.  States he had to get out of the bed due to back pain.    Objective: Vital signs in last 24 hours: Temp:  [97.9 F (36.6 C)-98.2 F (36.8 C)] 98.1 F (36.7 C) (03/26 0312) Pulse Rate:  [68-82] 82 (03/26 0312) Cardiac Rhythm: Normal sinus rhythm (03/25 1900) Resp:  [18-20] 18 (03/26 0312) BP: (129-152)/(61-75) 152/75 (03/26 0312) SpO2:  [96 %-100 %] 98 % (03/26 0312)  Intake/Output from previous day: 03/25 0701 - 03/26 0700 In: 120 [P.O.:120] Out: 775 [Urine:750; Drains:25]  General appearance: alert, cooperative and no distress Heart: regular rate and rhythm Lungs: clear to auscultation bilaterally Abdomen: soft, non-tender; bowel sounds normal; no masses,  no organomegaly Extremities: extremities normal, atraumatic, no cyanosis or edema Wound: Pravena in place, output from JP drains remains low  Lab Results: No results for input(s): WBC, HGB, HCT, PLT in the last 72 hours. BMET: No results for input(s): NA, K, CL, CO2, GLUCOSE, BUN, CREATININE, CALCIUM in the last 72 hours.  PT/INR: No results for input(s): LABPROT, INR in the last 72 hours. ABG    Component Value Date/Time   PHART 7.391 07/24/2020 1410   HCO3 23.4 07/24/2020 1410   TCO2 23 02/01/2020 0102   ACIDBASEDEF 0.9 07/24/2020 1410   O2SAT 97.0 07/24/2020 1410   CBG (last 3)  No results for input(s): GLUCAP in the last 72 hours.  Assessment/Plan: S/P Procedure(s) (LRB): STERNAL RECONSTRUCTION (N/A) APPLICATION OF WOUND VAC PREVENA (N/A)  1. CV- hemodynamically stable, elevated BP overnight, however patients states they checked it when he called out for pain medication, suspect pain related.. his blood pressure has been well  controlled during his stay on Lopressor, Lisinopril 2. Pulm- no acute issues 3. Renal- creatinine has been stable 4. ID- + Propionibacterium acnes, patient is afebrile, appreciated ID assistance.. picc line has been placed, will continue Rocephin for 6 weeks per recommendations 5. Pain- improving, stop all IV pain medications, utilize oral medications today 6. Dispo- patient stable, continue current care, will plan for d/c tomorrow if pain is controlled with oral agents ordered   LOS: 8 days    Ellwood Handler, PA-C 08/02/2020   Agree with above Continue wound care  [place]

## 2020-08-03 MED ORDER — CEFTRIAXONE IV (FOR PTA / DISCHARGE USE ONLY): 2.0000 g | INTRAVENOUS | 0 refills | Status: AC

## 2020-08-03 MED ORDER — TRAMADOL HCL 50 MG PO TABS: 50.0000 mg | ORAL_TABLET | ORAL | 0 refills | Status: DC | PRN

## 2020-08-03 MED ORDER — LISINOPRIL 5 MG PO TABS: 5.0000 mg | ORAL_TABLET | Freq: Every day | ORAL | 3 refills | Status: DC

## 2020-08-03 MED ORDER — ACETAMINOPHEN 325 MG PO TABS: 650.0000 mg | ORAL_TABLET | Freq: Four times a day (QID) | ORAL | Status: DC | PRN

## 2020-08-03 MED ORDER — POLYETHYLENE GLYCOL 3350 17 G PO PACK: 17.0000 g | PACK | Freq: Every day | ORAL | 0 refills | Status: DC

## 2020-08-03 NOTE — Progress Notes (Signed)
° °   °  [address]       [place] [address]             [phone]      5 Days Post-Op Procedure(s) (LRB): STERNAL RECONSTRUCTION (N/A) APPLICATION OF WOUND VAC PREVENA (N/A)   Subjective:  No new complaints.  Continues to feel better.   Objective: Vital signs in last 24 hours: Temp:  [97.6 F (36.4 C)-98.6 F (37 C)] 98.6 F (37 C) (03/27 0307) Pulse Rate:  [82-83] 82 (03/27 0307) Cardiac Rhythm: Normal sinus rhythm (03/26 1900) Resp:  [16-18] 16 (03/27 0307) BP: (134-151)/(70-89) 138/89 (03/27 0307) SpO2:  [97 %-100 %] 100 % (03/27 0307)  Intake/Output from previous day: 03/26 0701 - 03/27 0700 In: 1100 [P.O.:960; IV Piggyback:100] Out: 1285 [Urine:1275; Drains:10]  General appearance: alert, cooperative and no distress Heart: regular rate and rhythm Lungs: clear to auscultation bilaterally Abdomen: soft, non-tender; bowel sounds normal; no masses,  no organomegaly Extremities: extremities normal, atraumatic, no cyanosis or edema Wound: pravena remains in place, low output from JP drains  Lab Results: No results for input(s): WBC, HGB, HCT, PLT in the last 72 hours. BMET: No results for input(s): NA, K, CL, CO2, GLUCOSE, BUN, CREATININE, CALCIUM in the last 72 hours.  PT/INR: No results for input(s): LABPROT, INR in the last 72 hours. ABG    Component Value Date/Time   PHART 7.391 07/24/2020 1410   HCO3 23.4 07/24/2020 1410   TCO2 23 02/01/2020 0102   ACIDBASEDEF 0.9 07/24/2020 1410   O2SAT 97.0 07/24/2020 1410   CBG (last 3)  No results for input(s): GLUCAP in the last 72 hours.  Assessment/Plan: S/P Procedure(s) (LRB): STERNAL RECONSTRUCTION (N/A) APPLICATION OF WOUND VAC PREVENA (N/A)  1. CV- remains in NSR, BP stable continue Lopressor, Lisionpril 2. Pulm- no acute issues, off oxygen continue IS 3. ID- + propionibacterium acnes, PICC placed, on Rocephin per ID recommendations 4. Pain- managed with Tramadol 5. Dispo- patient  stable, converted Pravena to portable machine, will d/c blake drains, d/c home tomorrow   LOS: 9 days    Ellwood Handler, PA-C 08/03/2020

## 2020-08-03 NOTE — TOC Transition Note (Signed)
Transition of Care (TOC) - CM/SW Discharge Note Marvetta Gibbons RN, BSN Transitions of Care Unit 4E- RN Case Manager See Treatment Team for direct phone #    Patient Details  Name: DARIO YONO MRN: [id number] Date of Birth: 05/26/1950  Transition of Care [facility]) CM/SW Contact:  Dawayne Patricia, RN Phone Number: 08/03/2020, 10:03 AM   Clinical Narrative:    Pt for transition home today, pt will go home with Prevena wound VAC, bedside RN to change over to prevena battery box prior to discharge. PICC line has been placed and pt will receive today's dose of IV abx prior to discharge.  Have spoken with Pam at [facility] for home IV abx needs - they are ready for pt needs at home and have set up [facility] with [place], pt will have start of care tomorrow for nursing visit. Education has been done with pt and wife at the bedside.    Pt will have transport come later this am after he completes his abx infusion.    Final next level of care: [place] Barriers to Discharge: Barriers Resolved   Patient Goals and CMS Choice Patient states their goals for this hospitalization and ongoing recovery are:: return home CMS Medicare.gov Compare Post Acute Care list provided to:: Patient Choice offered to / list presented to : Patient  Discharge Placement                  Home with [facility]      Discharge Plan and Services   Discharge Planning Services: CM Consult Post Acute Care Choice: Home Health          DME Arranged:  (prevena)         HH Arranged: RN,IV Antibiotics HH Agency: [facility] Date HH Agency Contacted: 08/01/20 Time [place]: [id number] Representative spoke with at [place]: [place] Determinants of Health ([place]) Interventions     Readmission Risk Interventions Readmission Risk Prevention Plan 08/03/2020  Post Dischage Appt Complete  Medication Screening Complete  Transportation Screening Complete  Some recent data might be hidden

## 2020-08-06 ENCOUNTER — Other Ambulatory Visit: Payer: Self-pay | Admitting: Cardiothoracic Surgery

## 2020-08-06 DIAGNOSIS — Z951 Presence of aortocoronary bypass graft: Secondary | ICD-10-CM

## 2020-08-07 ENCOUNTER — Ambulatory Visit (INDEPENDENT_AMBULATORY_CARE_PROVIDER_SITE_OTHER): Payer: Self-pay | Admitting: Cardiothoracic Surgery

## 2020-08-07 ENCOUNTER — Ambulatory Visit
Admission: RE | Admit: 2020-08-07 | Discharge: 2020-08-07 | Disposition: A | Discharge status: Home / Self Care | Payer: No Typology Code available for payment source | Source: Ambulatory Visit | Attending: Cardiothoracic Surgery | Admitting: Cardiothoracic Surgery

## 2020-08-07 ENCOUNTER — Other Ambulatory Visit: Payer: Self-pay

## 2020-08-07 VITALS — BP 135/73 | HR 71 | Temp 97.6°F | Resp 20 | Ht 71.0 in | Wt 188.0 lb

## 2020-08-07 DIAGNOSIS — S21109A Unspecified open wound of unspecified front wall of thorax without penetration into thoracic cavity, initial encounter: Secondary | ICD-10-CM | POA: Diagnosis not present

## 2020-08-07 DIAGNOSIS — Z951 Presence of aortocoronary bypass graft: Secondary | ICD-10-CM

## 2020-08-07 DIAGNOSIS — T8132XA Disruption of internal operation (surgical) wound, not elsewhere classified, initial encounter: Secondary | ICD-10-CM

## 2020-08-07 DIAGNOSIS — Z452 Encounter for adjustment and management of vascular access device: Secondary | ICD-10-CM | POA: Diagnosis not present

## 2020-08-07 MED ORDER — OXYCODONE-ACETAMINOPHEN 5-325 MG PO TABS: 1.0000 | ORAL_TABLET | ORAL | 0 refills | Status: DC | PRN

## 2020-08-07 NOTE — Progress Notes (Signed)
The patient returns for outpatient visit status post sternal reconstruction for sternal dehiscence.  He is status post CABG on 01/31/2020.  He did well until he developed Covid several weeks later at which point he experienced significant intractable coughing and subsequently had sternal instability.  He was admitted this past month for sternal reconstruction.  He now presents for his initial outpatient visit.  He states that he has had ongoing pain but no fevers.  Physical exam:  BP 135/73   Pulse 71   Temp 97.6 F (36.4 C) (Skin)   Resp 20   Ht 5\' 11"  (1.803 m)   Wt 85.3 kg   SpO2 97% Comment: RA  BMI 26.22 kg/m    Well-appearing no acute distress Sternum is stable with well-healed incision Clear to auscultation bilaterally Regular rate and rhythm  Imaging: Stable sternal hardware and clear lung fields  Impression/plan:  Prescription given for oxycodone Continue sternal precautions Follow-up in 1 month  Daniel [name] Z. Orvan Seen, [place]

## 2020-08-11 ENCOUNTER — Encounter: Payer: Self-pay | Admitting: Internal Medicine

## 2020-08-18 ENCOUNTER — Telehealth: Payer: Self-pay

## 2020-08-18 NOTE — Telephone Encounter (Signed)
Patient's wife, Daniel [name] contacted the office concerned about husband's increased pain. She states that it has gotten worse over the last week. Patient stated that it feels like "my chest is opening up".  Patient's wife states that the incision looks to be healing well.  Also states that at his last follow-up appointment with Dr. Orvan Seen stated that per his chest xray, "everything looked good".  Oxycodone patient was prescribed is no longer helping.  When asked to describe the pain patient stated that it felt like a "numbness, sore" feeling rather than a sharp pain at the incision site.  He is able to walk and do activities.  It hurts to touch. Advised that we could move his appointment up to Monday, 08/25/20 for his follow-up with Dr. Orvan Seen.  Also advised that if the pain gets worse or is unbearable, he should not wait and go to the nearest emergency room for evaluation.  She acknowledged receipt.

## 2020-08-21 ENCOUNTER — Other Ambulatory Visit: Payer: Self-pay | Admitting: Cardiothoracic Surgery

## 2020-08-21 DIAGNOSIS — Z951 Presence of aortocoronary bypass graft: Secondary | ICD-10-CM

## 2020-08-25 ENCOUNTER — Ambulatory Visit (INDEPENDENT_AMBULATORY_CARE_PROVIDER_SITE_OTHER): Payer: Self-pay | Admitting: Cardiothoracic Surgery

## 2020-08-25 ENCOUNTER — Other Ambulatory Visit: Payer: Self-pay

## 2020-08-25 ENCOUNTER — Ambulatory Visit
Admission: RE | Admit: 2020-08-25 | Discharge: 2020-08-25 | Disposition: A | Discharge status: Home / Self Care | Payer: No Typology Code available for payment source | Source: Ambulatory Visit | Attending: Cardiothoracic Surgery | Admitting: Cardiothoracic Surgery

## 2020-08-25 ENCOUNTER — Encounter: Payer: Self-pay | Admitting: Cardiothoracic Surgery

## 2020-08-25 VITALS — BP 166/78 | HR 78 | Resp 20 | Ht 71.0 in | Wt 186.0 lb

## 2020-08-25 DIAGNOSIS — Z951 Presence of aortocoronary bypass graft: Secondary | ICD-10-CM

## 2020-08-25 DIAGNOSIS — Z452 Encounter for adjustment and management of vascular access device: Secondary | ICD-10-CM | POA: Diagnosis not present

## 2020-08-25 DIAGNOSIS — T84218D Breakdown (mechanical) of internal fixation device of other bones, subsequent encounter: Secondary | ICD-10-CM

## 2020-08-25 MED ORDER — OXYCODONE-ACETAMINOPHEN 5-325 MG PO TABS
1.0000 | ORAL_TABLET | Freq: Four times a day (QID) | ORAL | 0 refills | Status: DC | PRN
Start: 2020-08-25 — End: 2020-09-30

## 2020-08-25 MED ORDER — PREGABALIN 25 MG PO CAPS: 25.0000 mg | ORAL_CAPSULE | Freq: Three times a day (TID) | ORAL | 2 refills | Status: DC
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
## 2020-08-25 NOTE — Progress Notes (Signed)
The patient returns for follow-up.  He is status post sternal reconstruction for sternal dehiscence.  He states he has had pain across the upper part of his chest.  There is no sternal instability.  He denies fevers and chills.  He is not coughing  Physical exam:  BP (!) 166/78 (BP Location: Left Arm, Patient Position: Sitting)   Pulse 78   Resp 20   Ht 5\' 11"  (1.803 m)   Wt 84.4 kg   SpO2 94% Comment: RA  BMI 25.94 kg/m  Well-appearing no acute distress Clear to auscultation Regular rate and rhythm Incision is well-healed with no sternal instability  Imaging:  Chest x-ray with stable hardware and clear lung fields, stable mediastinal silhouette  Impression: Residual sternal pain status post reconstruction  Plan: Prescription written for oxycodone and for Lyrica Follow-up in 10 days for repeat examination  Megean Fabio Z. Orvan Seen, [place]

## 2020-08-26 ENCOUNTER — Inpatient Hospital Stay: Payer: No Typology Code available for payment source | Admitting: Internal Medicine

## 2020-08-26 DIAGNOSIS — M869 Osteomyelitis, unspecified: Secondary | ICD-10-CM | POA: Diagnosis not present

## 2020-08-26 NOTE — Telephone Encounter (Signed)
Per MD called patient to reschedule missed hospital follow up appointment before 5/6. If not able to reschedule before this will need to schedule 30 min appointment with any ID provider. Left message on patient's spouse phone to call and reschedule appointment. Attempted to call patient's number, but number belongs to transportation service. Sent mychart message requesting appointment.

## 2020-09-02 ENCOUNTER — Encounter: Payer: Self-pay | Admitting: Internal Medicine

## 2020-09-04 ENCOUNTER — Encounter: Payer: Self-pay | Admitting: Cardiothoracic Surgery

## 2020-09-04 ENCOUNTER — Ambulatory Visit: Payer: No Typology Code available for payment source | Admitting: Cardiothoracic Surgery

## 2020-09-04 ENCOUNTER — Ambulatory Visit (INDEPENDENT_AMBULATORY_CARE_PROVIDER_SITE_OTHER): Payer: Self-pay | Admitting: Cardiothoracic Surgery

## 2020-09-04 ENCOUNTER — Other Ambulatory Visit: Payer: Self-pay

## 2020-09-04 VITALS — BP 168/90 | HR 84 | Resp 20 | Ht 71.0 in | Wt 185.4 lb

## 2020-09-04 DIAGNOSIS — T84218D Breakdown (mechanical) of internal fixation device of other bones, subsequent encounter: Secondary | ICD-10-CM

## 2020-09-04 DIAGNOSIS — Z951 Presence of aortocoronary bypass graft: Secondary | ICD-10-CM

## 2020-09-04 MED ORDER — PREGABALIN 50 MG PO CAPS: 50.0000 mg | ORAL_CAPSULE | Freq: Three times a day (TID) | ORAL | 2 refills | Status: DC

## 2020-09-04 MED ORDER — OXYCODONE HCL 5 MG PO TABS: 5.0000 mg | ORAL_TABLET | Freq: Three times a day (TID) | ORAL | 0 refills | Status: DC | PRN

## 2020-09-04 NOTE — Progress Notes (Signed)
The patient returns for wound check.  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  As a result of recovering from [place] he had a sternal dehiscence.  This led to sternal reconstruction in late March of this year.  He has been doing well.  At his last appointment he was given a prescription for Lyrica.  He states that this is helped a little.  Of note, he was started on a low dose.  He denies any fevers or chills.  He is finishing up antibiotics which were used as coverage for sternal wound in the first week of May.  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  This is worse with temperature variation.  Physical exam: BP (!) 168/90 (BP Location: Left Arm, Patient Position: Sitting, Cuff Size: Normal)   Pulse 84   Resp 20   Ht 5\' 11"  (1.803 m)   Wt 84.1 kg   SpO2 99% Comment: RA  BMI 25.86 kg/m  Well-appearing no acute distress Alert and oriented Clear to auscultation Regular rate and rhythm Incisions well-healed  Imaging: No new studies  Impression/plan:  Improving slowly Increase Lyrica to 50 mg p.o. 3 times daily Prescription is given for oxycodone 5 mg tablets #20 Refer to chronic pain management; follow-up with thoracic surgery as needed  Lequisha Cammack Z. Orvan Seen, [place]

## 2020-09-08 ENCOUNTER — Inpatient Hospital Stay: Payer: No Typology Code available for payment source | Admitting: Internal Medicine

## 2020-09-08 ENCOUNTER — Telehealth: Payer: Self-pay
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
## 2020-09-08 NOTE — Telephone Encounter (Signed)
Called patient regarding appointment for today at 2:00. Patient states that he is currently in [place] and will not able to come into for another 50 mins. Patient declined video and phone visit. Is rescheduled for Wednesday at 9:30. Leatrice Jewels, RMA

## 2020-09-10 ENCOUNTER — Other Ambulatory Visit: Payer: Self-pay

## 2020-09-10 ENCOUNTER — Encounter: Payer: Self-pay | Admitting: Internal Medicine

## 2020-09-10 ENCOUNTER — Ambulatory Visit (INDEPENDENT_AMBULATORY_CARE_PROVIDER_SITE_OTHER): Payer: No Typology Code available for payment source | Admitting: Internal Medicine

## 2020-09-10 ENCOUNTER — Telehealth: Payer: Self-pay

## 2020-09-10 VITALS — BP 168/86 | HR 86 | Temp 98.2°F | Wt 184.0 lb

## 2020-09-10 DIAGNOSIS — M869 Osteomyelitis, unspecified: Secondary | ICD-10-CM

## 2020-09-10 HISTORY — DX: Osteomyelitis, unspecified: M86.9

## 2020-09-10 MED ORDER — AMOXICILLIN 500 MG PO CAPS: 500.0000 mg | ORAL_CAPSULE | Freq: Three times a day (TID) | ORAL | 2 refills | Status: AC

## 2020-09-10 NOTE — Telephone Encounter (Signed)
Per MD called [facility] with orders to pull picc after last dose. Spoke with Tim who will relay orders to nursing. Orders repeated and confirmed before ending call. Leatrice Jewels, RMA
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
## 2020-09-10 NOTE — Patient Instructions (Signed)
Thank you for coming to see me today. It was a pleasure seeing you.  To Do: Marland Kitchen Continue Ceftriaxone via PICC line through 09/12/20 then your PICC line will be removed . After that, start taking Amoxicillin 500mg  three times per day for antibiotic suppression . Follow up with me in 3 months  If you have any questions or concerns, please do not hesitate to call the office at [phone].  Take Care,   Jule Ser, DO

## 2020-09-10 NOTE — Assessment & Plan Note (Signed)
Patient s/p sternal reconstruction and hardware placement 07/2020 with OR cultures growing Cutibacterium acnes.  OPAT labs from 09/02/20 stable with normal WBC and ESR.  Will continue ceftriaxone 2gm daily via PICC line for planned 6 weeks (end date 09/12/20) before converting to amoxicillin for suppressive therapy for anticipated 6 months at least.  Prescription sent for amoxicillin 500 TID today.  RTC 3 months.

## 2020-09-27 ENCOUNTER — Inpatient Hospital Stay (HOSPITAL_BASED_OUTPATIENT_CLINIC_OR_DEPARTMENT_OTHER)
Admission: EM | Admit: 2020-09-27 | Discharge: 2020-09-30 | DRG: 418 | Disposition: A | Discharge status: Home / Self Care | Payer: No Typology Code available for payment source | Attending: Internal Medicine | Admitting: Internal Medicine

## 2020-09-27 ENCOUNTER — Other Ambulatory Visit: Payer: Self-pay

## 2020-09-27 ENCOUNTER — Emergency Department (HOSPITAL_BASED_OUTPATIENT_CLINIC_OR_DEPARTMENT_OTHER): Payer: No Typology Code available for payment source

## 2020-09-27 ENCOUNTER — Inpatient Hospital Stay (HOSPITAL_COMMUNITY): Payer: No Typology Code available for payment source

## 2020-09-27 ENCOUNTER — Encounter (HOSPITAL_BASED_OUTPATIENT_CLINIC_OR_DEPARTMENT_OTHER): Payer: Self-pay | Admitting: Emergency Medicine

## 2020-09-27 DIAGNOSIS — R778 Other specified abnormalities of plasma proteins: Secondary | ICD-10-CM

## 2020-09-27 DIAGNOSIS — Z823 Family history of stroke: Secondary | ICD-10-CM

## 2020-09-27 DIAGNOSIS — Z955 Presence of coronary angioplasty implant and graft: Secondary | ICD-10-CM | POA: Diagnosis not present

## 2020-09-27 DIAGNOSIS — Z8616 Personal history of COVID-19: Secondary | ICD-10-CM | POA: Diagnosis not present

## 2020-09-27 DIAGNOSIS — Z7982 Long term (current) use of aspirin: Secondary | ICD-10-CM

## 2020-09-27 DIAGNOSIS — K810 Acute cholecystitis: Secondary | ICD-10-CM

## 2020-09-27 DIAGNOSIS — R109 Unspecified abdominal pain: Secondary | ICD-10-CM

## 2020-09-27 DIAGNOSIS — J449 Chronic obstructive pulmonary disease, unspecified: Secondary | ICD-10-CM | POA: Diagnosis present

## 2020-09-27 DIAGNOSIS — Z87891 Personal history of nicotine dependence: Secondary | ICD-10-CM

## 2020-09-27 DIAGNOSIS — Y900 Blood alcohol level of less than 20 mg/100 ml: Secondary | ICD-10-CM | POA: Diagnosis present

## 2020-09-27 DIAGNOSIS — Z86711 Personal history of pulmonary embolism: Secondary | ICD-10-CM | POA: Diagnosis not present

## 2020-09-27 DIAGNOSIS — Z79899 Other long term (current) drug therapy: Secondary | ICD-10-CM

## 2020-09-27 DIAGNOSIS — I10 Essential (primary) hypertension: Secondary | ICD-10-CM | POA: Diagnosis not present

## 2020-09-27 DIAGNOSIS — K449 Diaphragmatic hernia without obstruction or gangrene: Secondary | ICD-10-CM | POA: Diagnosis present

## 2020-09-27 DIAGNOSIS — Z7901 Long term (current) use of anticoagulants: Secondary | ICD-10-CM | POA: Diagnosis not present

## 2020-09-27 DIAGNOSIS — I2510 Atherosclerotic heart disease of native coronary artery without angina pectoris: Secondary | ICD-10-CM | POA: Diagnosis present

## 2020-09-27 DIAGNOSIS — K8020 Calculus of gallbladder without cholecystitis without obstruction: Secondary | ICD-10-CM | POA: Diagnosis present

## 2020-09-27 DIAGNOSIS — Z951 Presence of aortocoronary bypass graft: Secondary | ICD-10-CM | POA: Diagnosis not present

## 2020-09-27 DIAGNOSIS — K828 Other specified diseases of gallbladder: Secondary | ICD-10-CM | POA: Diagnosis present

## 2020-09-27 DIAGNOSIS — Z85828 Personal history of other malignant neoplasm of skin: Secondary | ICD-10-CM | POA: Diagnosis not present

## 2020-09-27 DIAGNOSIS — Z20822 Contact with and (suspected) exposure to covid-19: Secondary | ICD-10-CM | POA: Diagnosis present

## 2020-09-27 DIAGNOSIS — Z96643 Presence of artificial hip joint, bilateral: Secondary | ICD-10-CM | POA: Diagnosis present

## 2020-09-27 DIAGNOSIS — I110 Hypertensive heart disease with heart failure: Secondary | ICD-10-CM | POA: Diagnosis present

## 2020-09-27 DIAGNOSIS — I5032 Chronic diastolic (congestive) heart failure: Secondary | ICD-10-CM | POA: Diagnosis present

## 2020-09-27 DIAGNOSIS — M869 Osteomyelitis, unspecified: Secondary | ICD-10-CM | POA: Diagnosis not present

## 2020-09-27 DIAGNOSIS — R079 Chest pain, unspecified: Secondary | ICD-10-CM

## 2020-09-27 DIAGNOSIS — I69334 Monoplegia of upper limb following cerebral infarction affecting left non-dominant side: Secondary | ICD-10-CM

## 2020-09-27 DIAGNOSIS — I25118 Atherosclerotic heart disease of native coronary artery with other forms of angina pectoris: Secondary | ICD-10-CM

## 2020-09-27 DIAGNOSIS — E785 Hyperlipidemia, unspecified: Secondary | ICD-10-CM | POA: Diagnosis not present

## 2020-09-27 DIAGNOSIS — K8010 Calculus of gallbladder with chronic cholecystitis without obstruction: Secondary | ICD-10-CM | POA: Diagnosis not present

## 2020-09-27 DIAGNOSIS — F32A Depression, unspecified: Secondary | ICD-10-CM | POA: Diagnosis present

## 2020-09-27 DIAGNOSIS — Z8249 Family history of ischemic heart disease and other diseases of the circulatory system: Secondary | ICD-10-CM | POA: Diagnosis not present

## 2020-09-27 DIAGNOSIS — K8000 Calculus of gallbladder with acute cholecystitis without obstruction: Secondary | ICD-10-CM | POA: Diagnosis not present

## 2020-09-27 DIAGNOSIS — R112 Nausea with vomiting, unspecified: Secondary | ICD-10-CM

## 2020-09-27 DIAGNOSIS — I4891 Unspecified atrial fibrillation: Secondary | ICD-10-CM | POA: Diagnosis present

## 2020-09-27 DIAGNOSIS — F1010 Alcohol abuse, uncomplicated: Secondary | ICD-10-CM | POA: Diagnosis present

## 2020-09-27 LAB — COMPREHENSIVE METABOLIC PANEL
ALT: 17 U/L (ref 0–44)
AST: 25 U/L (ref 15–41)
Albumin: 4.1 g/dL (ref 3.5–5.0)
Alkaline Phosphatase: 99 U/L (ref 38–126)
Anion gap: 11 (ref 5–15)
BUN: 20 mg/dL (ref 8–23)
CO2: 23 mmol/L (ref 22–32)
Calcium: 9.1 mg/dL (ref 8.9–10.3)
Chloride: 103 mmol/L (ref 98–111)
Creatinine, Ser: 0.79 mg/dL (ref 0.61–1.24)
GFR, Estimated: >60 mL/min (ref >60)
Glucose, Bld: 126 mg/dL — ABNORMAL HIGH (ref 70–99)
Potassium: 4.1 mmol/L (ref 3.5–5.1)
Sodium: 137 mmol/L (ref 135–145)
Total Bilirubin: 0.3 mg/dL (ref 0.3–1.2)
Total Protein: 7.4 g/dL (ref 6.5–8.1)

## 2020-09-27 LAB — TROPONIN I (HIGH SENSITIVITY)
Troponin I (High Sensitivity): 26 ng/L — ABNORMAL HIGH (ref <18)
Troponin I (High Sensitivity): 26 ng/L — ABNORMAL HIGH (ref <18)

## 2020-09-27 LAB — CBC WITH DIFFERENTIAL/PLATELET
Abs Immature Granulocytes: 0.03 10*3/uL (ref 0.00–0.07)
Basophils Absolute: 0 10*3/uL (ref 0.0–0.1)
Basophils Relative: 0 %
Eosinophils Absolute: 0 10*3/uL (ref 0.0–0.5)
Eosinophils Relative: 0 %
HCT: 37.6 % — ABNORMAL LOW (ref 39.0–52.0)
Hemoglobin: 11.7 g/dL — ABNORMAL LOW (ref 13.0–17.0)
Immature Granulocytes: 0 %
Lymphocytes Relative: 12 %
Lymphs Abs: 1.1 10*3/uL (ref 0.7–4.0)
MCH: 23.7 pg — ABNORMAL LOW (ref 26.0–34.0)
MCHC: 31.1 g/dL (ref 30.0–36.0)
MCV: 76.1 fL — ABNORMAL LOW (ref 80.0–100.0)
Monocytes Absolute: 0.4 10*3/uL (ref 0.1–1.0)
Monocytes Relative: 4 %
Neutro Abs: 7.4 10*3/uL (ref 1.7–7.7)
Neutrophils Relative %: 84 %
Platelets: 234 10*3/uL (ref 150–400)
RBC: 4.94 MIL/uL (ref 4.22–5.81)
RDW: 16.2 % — ABNORMAL HIGH (ref 11.5–15.5)
WBC: 8.9 10*3/uL (ref 4.0–10.5)
nRBC: 0 % (ref 0.0–0.2)

## 2020-09-27 LAB — URINALYSIS, ROUTINE W REFLEX MICROSCOPIC
Bilirubin Urine: NEGATIVE
Glucose, UA: NEGATIVE mg/dL
Hgb urine dipstick: NEGATIVE
Ketones, ur: NEGATIVE mg/dL
Leukocytes,Ua: NEGATIVE
Nitrite: NEGATIVE
Protein, ur: NEGATIVE mg/dL
Specific Gravity, Urine: <1.005 — ABNORMAL LOW (ref 1.005–1.030)
pH: 6 (ref 5.0–8.0)

## 2020-09-27 LAB — RESP PANEL BY RT-PCR (FLU A&B, COVID) ARPGX2
Influenza A by PCR: NEGATIVE
Influenza B by PCR: NEGATIVE
SARS Coronavirus 2 by RT PCR: NEGATIVE

## 2020-09-27 LAB — LIPASE, BLOOD: Lipase: 27 U/L (ref 11–51)

## 2020-09-27 MED ORDER — ACETAMINOPHEN 325 MG PO TABS
650.0000 mg | ORAL_TABLET | Freq: Four times a day (QID) | ORAL | Status: DC | PRN
  Administered 2020-09-27 – 2020-09-28 (×3): 650 mg via ORAL
  Filled 2020-09-27 (×3): qty 2

## 2020-09-27 MED ORDER — SODIUM CHLORIDE 0.9 % IV BOLUS
500.0000 mL | Freq: Once | INTRAVENOUS | Status: AC
  Administered 2020-09-27: 500 mL via INTRAVENOUS

## 2020-09-27 MED ORDER — POLYETHYLENE GLYCOL 3350 17 G PO PACK: 17.0000 g | PACK | Freq: Every day | ORAL | Status: DC | PRN

## 2020-09-27 MED ORDER — HYDROCODONE-ACETAMINOPHEN 5-325 MG PO TABS
1.0000 | ORAL_TABLET | ORAL | Status: DC | PRN
  Administered 2020-09-28 (×2): 1 via ORAL
  Filled 2020-09-27 (×2): qty 1

## 2020-09-27 MED ORDER — MORPHINE SULFATE (PF) 2 MG/ML IV SOLN: 2.0000 mg | INTRAVENOUS | Status: DC | PRN

## 2020-09-27 MED ORDER — METOPROLOL TARTRATE 25 MG PO TABS
25.0000 mg | ORAL_TABLET | Freq: Two times a day (BID) | ORAL | Status: DC
  Administered 2020-09-27 – 2020-09-30 (×7): 25 mg via ORAL
  Filled 2020-09-27 (×7): qty 1

## 2020-09-27 MED ORDER — SODIUM CHLORIDE 0.9% FLUSH
3.0000 mL | Freq: Two times a day (BID) | INTRAVENOUS | Status: DC
  Administered 2020-09-27 – 2020-09-29 (×2): 3 mL via INTRAVENOUS

## 2020-09-27 MED ORDER — FENTANYL CITRATE (PF) 100 MCG/2ML IJ SOLN
50.0000 ug | Freq: Once | INTRAMUSCULAR | Status: AC
  Administered 2020-09-27: 50 ug via INTRAVENOUS
  Filled 2020-09-27: qty 2

## 2020-09-27 MED ORDER — HEPARIN SODIUM (PORCINE) 5000 UNIT/ML IJ SOLN
5000.0000 [IU] | Freq: Three times a day (TID) | INTRAMUSCULAR | Status: DC
  Administered 2020-09-27 – 2020-09-28 (×3): 5000 [IU] via SUBCUTANEOUS
  Filled 2020-09-27 (×3): qty 1

## 2020-09-27 MED ORDER — ATORVASTATIN CALCIUM 40 MG PO TABS
80.0000 mg | ORAL_TABLET | Freq: Every day | ORAL | Status: DC
  Administered 2020-09-27 – 2020-09-30 (×4): 80 mg via ORAL
  Filled 2020-09-27 (×4): qty 2

## 2020-09-27 MED ORDER — METOPROLOL TARTRATE 5 MG/5ML IV SOLN: 5.0000 mg | Freq: Four times a day (QID) | INTRAVENOUS | Status: DC | PRN

## 2020-09-27 MED ORDER — PIPERACILLIN-TAZOBACTAM 3.375 G IVPB 30 MIN
3.3750 g | Freq: Once | INTRAVENOUS | Status: AC
  Administered 2020-09-27: 3.375 g via INTRAVENOUS
  Filled 2020-09-27: qty 50

## 2020-09-27 MED ORDER — PREGABALIN 50 MG PO CAPS
50.0000 mg | ORAL_CAPSULE | Freq: Three times a day (TID) | ORAL | Status: DC
  Administered 2020-09-27: 50 mg via ORAL
  Filled 2020-09-27 (×6): qty 1

## 2020-09-27 MED ORDER — ONDANSETRON HCL 4 MG/2ML IJ SOLN
4.0000 mg | Freq: Once | INTRAMUSCULAR | Status: AC
  Administered 2020-09-27: 4 mg via INTRAVENOUS
  Filled 2020-09-27: qty 2

## 2020-09-27 MED ORDER — DICYCLOMINE HCL 10 MG/ML IM SOLN
20.0000 mg | Freq: Once | INTRAMUSCULAR | Status: AC
  Administered 2020-09-27: 20 mg via INTRAMUSCULAR
  Filled 2020-09-27: qty 2

## 2020-09-27 MED ORDER — PIPERACILLIN-TAZOBACTAM 3.375 G IVPB
3.3750 g | Freq: Three times a day (TID) | INTRAVENOUS | Status: DC
  Administered 2020-09-27 – 2020-09-30 (×8): 3.375 g via INTRAVENOUS
  Filled 2020-09-27 (×12): qty 50

## 2020-09-27 MED ORDER — ACETAMINOPHEN 650 MG RE SUPP: 650.0000 mg | Freq: Four times a day (QID) | RECTAL | Status: DC | PRN

## 2020-09-27 MED ORDER — NICOTINE 21 MG/24HR TD PT24
21.0000 mg | MEDICATED_PATCH | Freq: Every day | TRANSDERMAL | Status: DC
  Administered 2020-09-27 – 2020-09-30 (×3): 21 mg via TRANSDERMAL
  Filled 2020-09-27 (×4): qty 1

## 2020-09-27 MED ORDER — IOHEXOL 350 MG/ML SOLN
100.0000 mL | Freq: Once | INTRAVENOUS | Status: AC | PRN
  Administered 2020-09-27: 100 mL via INTRAVENOUS

## 2020-09-27 MED ORDER — SODIUM CHLORIDE 0.9 % IV SOLN: INTRAVENOUS | Status: DC

## 2020-09-27 MED ORDER — MORPHINE SULFATE (PF) 4 MG/ML IV SOLN
4.0000 mg | Freq: Once | INTRAVENOUS | Status: AC
  Administered 2020-09-27: 4 mg via INTRAVENOUS
  Filled 2020-09-27: qty 1

## 2020-09-27 NOTE — ED Triage Notes (Signed)
Patient arrived via POV c/o emesis with abdominal pain x 6 hrs. Patient states 3 episodes of emesis. Patient is AO x 4, VS with elevated BP, unable to walk.
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
## 2020-09-27 NOTE — ED Notes (Signed)
Patient transported to CT 

## 2020-09-27 NOTE — ED Notes (Signed)
Attempted report to floor x 2. No response.

## 2020-09-27 NOTE — ED Provider Notes (Signed)
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 including the CDC and federal and state organizations. These policies and algorithms were followed during the patient's care in the ED.  ED Course  I have reviewed the triage vital signs and the nursing notes.  Pertinent labs & imaging results  that were available during my care of the patient were reviewed by me and considered in my medical decision making (see chart for details).     Final Clinical Impression(s) / ED Diagnoses Admit to medicine   Dequavius Kuhner, MD 09/27/20 [id number]

## 2020-09-27 NOTE — Plan of Care (Signed)
?  Problem: Clinical Measurements: ?Goal: Ability to maintain clinical measurements within normal limits will improve ?Outcome: Progressing ?Goal: Will remain free from infection ?Outcome: Progressing ?Goal: Diagnostic test results will improve ?Outcome: Progressing ?  ?

## 2020-09-28 DIAGNOSIS — M869 Osteomyelitis, unspecified: Secondary | ICD-10-CM | POA: Diagnosis not present

## 2020-09-28 DIAGNOSIS — K810 Acute cholecystitis: Secondary | ICD-10-CM | POA: Diagnosis not present

## 2020-09-28 DIAGNOSIS — I25118 Atherosclerotic heart disease of native coronary artery with other forms of angina pectoris: Secondary | ICD-10-CM | POA: Diagnosis not present

## 2020-09-28 DIAGNOSIS — I5032 Chronic diastolic (congestive) heart failure: Secondary | ICD-10-CM | POA: Diagnosis not present

## 2020-09-28 LAB — CBC
HCT: 38.5 % — ABNORMAL LOW (ref 39.0–52.0)
Hemoglobin: 12 g/dL — ABNORMAL LOW (ref 13.0–17.0)
MCH: 23.9 pg — ABNORMAL LOW (ref 26.0–34.0)
MCHC: 31.2 g/dL (ref 30.0–36.0)
MCV: 76.5 fL — ABNORMAL LOW (ref 80.0–100.0)
Platelets: 225 10*3/uL (ref 150–400)
RBC: 5.03 MIL/uL (ref 4.22–5.81)
RDW: 16.7 % — ABNORMAL HIGH (ref 11.5–15.5)
WBC: 5.7 10*3/uL (ref 4.0–10.5)
nRBC: 0 % (ref 0.0–0.2)

## 2020-09-28 LAB — BASIC METABOLIC PANEL
Anion gap: 7 (ref 5–15)
BUN: 12 mg/dL (ref 8–23)
CO2: 24 mmol/L (ref 22–32)
Calcium: 8.9 mg/dL (ref 8.9–10.3)
Chloride: 105 mmol/L (ref 98–111)
Creatinine, Ser: 0.78 mg/dL (ref 0.61–1.24)
GFR, Estimated: >60 mL/min (ref >60)
Glucose, Bld: 99 mg/dL (ref 70–99)
Potassium: 4.1 mmol/L (ref 3.5–5.1)
Sodium: 136 mmol/L (ref 135–145)

## 2020-09-28 MED ORDER — HEPARIN SODIUM (PORCINE) 5000 UNIT/ML IJ SOLN
5000.0000 [IU] | Freq: Three times a day (TID) | INTRAMUSCULAR | Status: DC
  Administered 2020-09-28 – 2020-09-30 (×5): 5000 [IU] via SUBCUTANEOUS
  Filled 2020-09-28 (×5): qty 1

## 2020-09-28 NOTE — Progress Notes (Signed)
Patient request that his wife be contacted first with any updates relating to tomorrow's surgery. Then his brother updated, his brother will be waiting here for him. His brother is added to his contact list.

## 2020-09-28 NOTE — H&P (View-Only) (Signed)
Assessment & Plan: HD#2 - biliary colic, cholecystitis, cholelithiasis  USN with wall thickening, pericholecystic fluid, gallstones  Plan lap chole on Monday 5/23 - discontinued Brilinta per cardiology  Allow diet today, NPO after midnight CAD/Afib  See cardiology note with clearance        Armandina Gemma, MD       [facility]       Office: [phone]   Chief Complaint: Abdominal pain, biliary colic  Subjective: Patient comfortable this AM, wants to eat.  Denies pain.  Objective: Vital signs in last 24 hours: Temp:  [98 F (36.7 C)-98.5 F (36.9 C)] 98 F (36.7 C) (05/22 0425) Pulse Rate:  [64-82] 74 (05/22 0425) Resp:  [16-20] 16 (05/22 0425) BP: (103-143)/(60-83) 134/83 (05/22 0425) SpO2:  [98 %-100 %] 99 % (05/22 0425)    Intake/Output from previous day: 05/21 0701 - 05/22 0700 In: 916.6 [P.O.:780; IV Piggyback:136.6] Out: -  Intake/Output this shift: No intake/output data recorded.  Physical Exam: HEENT - sclerae clear, mucous membranes moist Neck - soft Abdomen - soft without distension; no mass; no tenderness Ext - no edema, non-tender Neuro - alert & oriented, no focal deficits  Lab Results:  Recent Labs    09/27/20 0154 09/28/20 0549  WBC 8.9 5.7  HGB 11.7* 12.0*  HCT 37.6* 38.5*  PLT 234 225   BMET Recent Labs    09/27/20 0154 09/28/20 0549  NA 137 136  K 4.1 4.1  CL 103 105  CO2 23 24  GLUCOSE 126* 99  BUN 20 12  CREATININE 0.79 0.78  CALCIUM 9.1 8.9   PT/INR No results for input(s): LABPROT, INR in the last 72 hours. Comprehensive Metabolic Panel:    Component Value Date/Time   NA 136 09/28/2020 0549   NA 137 09/27/2020 0154   NA 139 01/25/2020 1137   NA 140 12/21/2019 1608   K 4.1 09/28/2020 0549   K 4.1 09/27/2020 0154   CL 105 09/28/2020 0549   CL 103 09/27/2020 0154   CO2 24 09/28/2020 0549   CO2 23 09/27/2020 0154   BUN 12 09/28/2020 0549   BUN 20 09/27/2020 0154   BUN 14 01/25/2020 1137    BUN 22 12/21/2019 1608   CREATININE 0.78 09/28/2020 0549   CREATININE 0.79 09/27/2020 0154   GLUCOSE 99 09/28/2020 0549   GLUCOSE 126 (H) 09/27/2020 0154   CALCIUM 8.9 09/28/2020 0549   CALCIUM 9.1 09/27/2020 0154   AST 25 09/27/2020 0154   AST 22 07/24/2020 1359   ALT 17 09/27/2020 0154   ALT 19 07/24/2020 1359   ALKPHOS 99 09/27/2020 0154   ALKPHOS 85 07/24/2020 1359   BILITOT 0.3 09/27/2020 0154   BILITOT 1.0 07/24/2020 1359   BILITOT 0.6 12/29/2018 1045   PROT 7.4 09/27/2020 0154   PROT 6.5 07/24/2020 1359   PROT 6.2 12/29/2018 1045   ALBUMIN 4.1 09/27/2020 0154   ALBUMIN 3.9 07/24/2020 1359   ALBUMIN 4.5 12/29/2018 1045    Studies/Results: CT Angio Chest PE W and/or Wo Contrast  Result Date: 09/27/2020 CLINICAL DATA:  Chest pain or SOB, pleurisy or effusion suspected EXAM: CT ANGIOGRAPHY CHEST WITH CONTRAST TECHNIQUE: Multidetector CT imaging of the chest was performed using the standard protocol during bolus administration of intravenous contrast. Multiplanar CT image reconstructions and MIPs were obtained to evaluate the vascular anatomy. CONTRAST:  131mL OMNIPAQUE IOHEXOL 350 MG/ML SOLN COMPARISON:  Radiograph 08/25/2020 chest CT 07/18/2020 FINDINGS: Cardiovascular: There are no filling  defects within the pulmonary arteries to suggest pulmonary embolus. Moderate aortic atherosclerosis. No dissection or aneurysm. CABG with calcification of native coronary arteries. Stable heart size. No significant pericardial effusion/thickening. Mediastinum/Nodes: Scattered mediastinal lymph nodes are not enlarged by size criteria. No hilar adenopathy. Moderate-sized hiatal hernia, increased in size from prior CT. No visualized thyroid nodule. Lungs/Pleura: Breathing motion artifact limits detailed assessment. Minimal bronchial thickening. No acute or focal airspace disease. Subsegmental atelectasis in the lingula and hypoventilatory changes in the lower lobes. Minimal retained mucus in the  trachea. No pleural fluid. Upper Abdomen: Assessed on abdominopelvic CT earlier today. Distended gallbladder again seen with multiple gallstones. Musculoskeletal: Prior median sternotomy. Interval sternal plating from prior CT. Nonunion of prior sternotomy. No erosive or destructive change. No acute osseous abnormalities are seen. Multilevel thoracic spondylosis. Review of the MIP images confirms the above findings. IMPRESSION: 1. No pulmonary embolus or acute intrathoracic abnormality. 2. Moderate-sized hiatal hernia, increased in size from prior CT. 3. Distended gallbladder with cholelithiasis, assessed on abdominal CT earlier today. Aortic Atherosclerosis (ICD10-I70.0). Electronically Signed   By: Melanie  Sanford M.D.   On: 09/27/2020 03:52   US Abdomen Limited  Result Date: 09/27/2020 CLINICAL DATA:  Abnormal CT scan. EXAM: ULTRASOUND ABDOMEN LIMITED RIGHT UPPER QUADRANT COMPARISON:  CT abdomen pelvis 09/27/2020. FINDINGS: Gallbladder: Gallbladder wall thickness is reported at 3 mm. Portions appears somewhat thicker than mass. Pericholecystic fluid is noted. Large shadowing gallstones are present measuring up to 1.6 cm. Common bile duct: Diameter: 2.0 mm, within normal limits. Liver: No focal lesion identified. Within normal limits in parenchymal echogenicity. Portal vein is patent on color Doppler imaging with normal direction of blood flow towards the liver. Other: None. IMPRESSION: 1. Borderline gallbladder wall thickening with pericholecystic fluid and shadowing stones raises concern for acute cholecystitis. Electronically Signed   By: Christopher  Mattern M.D.   On: 09/27/2020 11:40   CT Renal Stone Study  Result Date: 09/27/2020 CLINICAL DATA:  Flank and abdominal pain.  Vomiting. EXAM: CT ABDOMEN AND PELVIS WITHOUT CONTRAST TECHNIQUE: Multidetector CT imaging of the abdomen and pelvis was performed following the standard protocol without IV contrast. COMPARISON:  11/14/2015 FINDINGS: Lower chest:  Minimal lingular atelectasis or scarring. Normal heart size with pacemaker partially included. Small to moderate hiatal hernia. Hepatobiliary: No evidence of focal hepatic lesion on noncontrast exam. Gallbladder is distended with calcified gallstones, including a probable stone in the gallbladder neck. Indistinct fat planes adjacent to the gallbladder suspicious for inflammation. Motion artifact limits detailed assessment. No definite common bile duct dilatation or choledocholithiasis. Pancreas: No ductal dilatation or inflammation. Spleen: Normal in size without focal abnormality. Adrenals/Urinary Tract: No adrenal nodule. Symmetric bilateral perinephric edema, slightly increased from prior exam. No hydronephrosis. No renal calculi. 2.5 cm cyst from the mid lower right kidney. Both ureters are decompressed without stones along the course. Bladder is physiologically distended without wall thickening or bladder stone. Portions of the bladder obscured by streak artifact from left hip arthroplasty. Stomach/Bowel: Moderate hiatal hernia. Decompressed stomach. No small bowel obstruction or inflammation. Normal appendix. Moderate colonic stool burden. Multifocal colonic diverticulosis. No diverticulitis or acute colonic inflammation. Vascular/Lymphatic: Aorto bi-iliac atherosclerosis. No aneurysm. No bulky abdominopelvic adenopathy. Reproductive: Prominent prostate gland. Other: No ascites. No free air or focal fluid collection. Tiny fat containing umbilical hernia. Minimal fat in the inguinal canals. Musculoskeletal: Prominent Schmorl's node versus minimal L1 superior endplate compression deformity, appears chronic. Multilevel degenerative change throughout the spine. Left hip arthroplasty right hip osteoarthritis. No acute osseous abnormalities   are seen. IMPRESSION: 1. Distended gallbladder with calcified gallstones and probable stone in the gallbladder neck. Indistinct fat planes adjacent to the gallbladder suspicious  for inflammation. Recommend right upper quadrant ultrasound for further evaluation. 2. No renal stones or obstructive uropathy. 3. Moderate hiatal hernia. Colonic diverticulosis without diverticulitis. Aortic Atherosclerosis (ICD10-I70.0). Electronically Signed   By: Melanie  Sanford M.D.   On: 09/27/2020 02:42      Anuradha Chabot 09/28/2020  Patient ID: Daniel [name], male   DOB: 07/01/1950, 70 y.o.   MRN: [id number]  

## 2020-09-28 NOTE — Progress Notes (Signed)
Patient ID: Daniel [name], male   DOB: [birth year], 70 y.o.   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  On presentation, his lipase was 27, AST of 25, ALT of 17 with negative COVID-19 and flu tests.  CTA chest and CT renal stone study showed distended gallbladder with calcified gallstones and probable stone in the gallbladder neck with indistinct flat planes adjacent to the gallbladder suspicious for inflammation, no renal stones, moderate hiatal hernia, no PE.  He was started on IV antibiotics.  General surgery and cardiology were consulted.  Assessment & Plan:   Possible acute calculus cholecystitis with biliary colic -General surgery following and planning for possible lap chole on 09/29/2020.  Continue Zosyn.  Right upper quadrant ultrasound shows possible acute cholecystitis.  LFTs normal on presentation.  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  Cardiology has given clearance for surgery.  Aspirin and Brilinta on hold.  Continue statin and metoprolol  Hypertension -Monitor blood pressure.  Continue metoprolol.  Lisinopril on hold  Chronic diastolic heart failure -Currently compensated.  Continue metoprolol.  Strict input and output.  Cutibacterium acnes sternal osteomyelitis  -treated with 6  weeks of IV Rocephin till 09/12/2020 with plans for suppressive amoxicillin (has not started yet).  Outpatient follow-up with Dr. Juleen China.  History of tobacco use -Uses nicotine patch at home.   DVT prophylaxis: Start heparin Code Status: Full Family Communication: None at bedside Disposition Plan: Status is: Inpatient  Remains inpatient appropriate because:Inpatient level of care appropriate due to severity of illness   Dispo: The patient is from: Home              Anticipated d/c is to: Home              Patient currently is not medically stable to d/c.   Difficult to place patient No  Consultants: Cardiology/general surgery  Procedures: None  Antimicrobials: Zosyn from 09/27/2020 onwards   Subjective: Patient seen and examined at bedside.  Denies any current worsening abdominal pain, nausea or vomiting.  No overnight fever reported.  Only complains of mild right upper abdominal pain intermittently.  Objective: Vitals:   09/27/20 1455 09/27/20 1852 09/27/20 2119 09/28/20 0425  BP: (!) 143/82 103/60 123/72 134/83  Pulse: 71 82 77 74  Resp: 18 20 20 16   Temp: 98.2 F (36.8 C) 98.5 F (36.9 C) 98.3 F (36.8 C) 98 F (36.7 C)  TempSrc: Oral  Oral Oral  SpO2:  100% 98% 99%  Weight:      Height:        Intake/Output Summary (Last 24 hours) at 09/28/2020 1102 Last data filed at 09/28/2020 0500 Gross per 24 hour  Intake 916.62 ml  Output --  Net 916.62 ml   Filed Weights   09/27/20 0146 09/27/20 0701  Weight: 83 kg 82.1 kg    Examination:  General exam: Appears calm and comfortable.  Currently on room air  respiratory system: Bilateral decreased breath sounds at bases Cardiovascular system: S1 & S2 heard, Rate controlled Gastrointestinal system: Abdomen is nondistended, soft and mildly tender in the right upper quadrant.  Normal bowel sounds heard. Extremities: No cyanosis, clubbing, edema  Central nervous system: Alert and oriented. No focal neurological  deficits. Moving extremities Skin: No rashes, lesions or ulcers Psychiatry: Judgement and insight appear normal. Mood & affect appropriate.     Data Reviewed: I have personally reviewed following labs and imaging studies  CBC: Recent Labs  Lab 09/27/20 0154 09/28/20 0549  WBC 8.9 5.7  NEUTROABS 7.4  --   HGB 11.7* 12.0*  HCT 37.6* 38.5*  MCV 76.1* 76.5*  PLT 234 716   Basic Metabolic Panel: Recent Labs  Lab 09/27/20 0154 09/28/20 0549  NA 137 136  K 4.1 4.1  CL 103 105  CO2 23 24  GLUCOSE 126* 99  BUN 20 12  CREATININE 0.79 0.78  CALCIUM 9.1 8.9   GFR: Estimated Creatinine Clearance: 92.8 mL/min (by C-G formula based on SCr of 0.78 mg/dL). Liver Function Tests: Recent Labs  Lab 09/27/20 0154  AST 25  ALT 17  ALKPHOS 99  BILITOT 0.3  PROT 7.4  ALBUMIN 4.1   Recent Labs  Lab 09/27/20 0154  LIPASE 27   No results for input(s): AMMONIA in the last 168 hours. Coagulation Profile: No results for input(s): INR, PROTIME in the last 168 hours. Cardiac Enzymes: No results for input(s): CKTOTAL, CKMB, CKMBINDEX, TROPONINI in the last 168 hours. BNP (last 3 results) No results for input(s): PROBNP in the last 8760 hours. HbA1C: No results for input(s): HGBA1C in the last 72 hours. CBG: No results for input(s): GLUCAP in the last 168 hours. Lipid Profile: No results for input(s): CHOL, HDL, LDLCALC, TRIG, CHOLHDL, LDLDIRECT in the last 72 hours. Thyroid Function Tests: No results for input(s): TSH, T4TOTAL, FREET4, T3FREE, THYROIDAB in the last 72 hours. Anemia Panel: No results for input(s): VITAMINB12, FOLATE, FERRITIN, TIBC, IRON, RETICCTPCT in the last 72 hours. Sepsis Labs: No results for input(s): PROCALCITON, LATICACIDVEN in the last 168 hours.  Recent Results (from the past 240 hour(s))  Resp Panel by RT-PCR (Flu A&B, Covid) Nasopharyngeal Swab     Status: None   Collection Time: 09/27/20  1:54 AM   Specimen: Nasopharyngeal Swab;  Nasopharyngeal(NP) swabs in vial transport medium  Result Value Ref Range Status   SARS Coronavirus 2 by RT PCR NEGATIVE NEGATIVE Final    Comment: (NOTE) SARS-CoV-2 target nucleic acids are NOT DETECTED.  The SARS-CoV-2 RNA is generally detectable in upper respiratory specimens during the acute phase of infection. The lowest concentration of SARS-CoV-2 viral copies this assay can detect is 138 copies/mL. A negative result does not preclude SARS-Cov-2 infection and should not be used as the sole basis for treatment or other patient management decisions. A negative result may occur with  improper specimen collection/handling, submission of specimen other than nasopharyngeal swab, presence of viral mutation(s) within the areas targeted by this assay, and inadequate number of viral copies(<138 copies/mL). A negative result must be combined with clinical observations, patient history, and epidemiological information. The expected result is Negative.  Fact Sheet for Patients:  EntrepreneurPulse.com.au  Fact Sheet for Healthcare Providers:  IncredibleEmployment.be  This test is no t yet approved or cleared by the [place] FDA and  has been authorized for detection and/or diagnosis of SARS-CoV-2 by FDA under an Emergency Use Authorization (EUA). This EUA will  remain  in effect (meaning this test can be used) for the duration of the COVID-19 declaration under Section 564(b)(1) of the Act, 21 U.S.C.section 360bbb-3(b)(1), unless the authorization is terminated  or revoked sooner.       Influenza A by PCR NEGATIVE NEGATIVE Final   Influenza B by PCR NEGATIVE NEGATIVE Final    Comment: (NOTE) The Xpert Xpress SARS-CoV-2/FLU/RSV plus assay is intended as an aid in the diagnosis of influenza from Nasopharyngeal swab specimens and should not be used as a sole basis for treatment. Nasal washings and aspirates are unacceptable for Xpert Xpress  SARS-CoV-2/FLU/RSV testing.  Fact Sheet for Patients: EntrepreneurPulse.com.au  Fact Sheet for Healthcare Providers: IncredibleEmployment.be  This test is not yet approved or cleared by the [place] FDA and has been authorized for detection and/or diagnosis of SARS-CoV-2 by FDA under an Emergency Use Authorization (EUA). This EUA will remain in effect (meaning this test can be used) for the duration of the COVID-19 declaration under Section 564(b)(1) of the Act, 21 U.S.C. section 360bbb-3(b)(1), unless the authorization is terminated or revoked.  Performed at [facility], [address] [place], [place] [address]          Radiology Studies: CT Angio Chest PE W and/or Wo Contrast  Result Date: 09/27/2020 CLINICAL DATA:  Chest pain or SOB, pleurisy or effusion suspected EXAM: CT ANGIOGRAPHY CHEST WITH CONTRAST TECHNIQUE: Multidetector CT imaging of the chest was performed using the standard protocol during bolus administration of intravenous contrast. Multiplanar CT image reconstructions and MIPs were obtained to evaluate the vascular anatomy. CONTRAST:  1100mL OMNIPAQUE IOHEXOL 350 MG/ML SOLN COMPARISON:  Radiograph 08/25/2020 chest CT 07/18/2020 FINDINGS: Cardiovascular: There are no filling defects within the pulmonary arteries to suggest pulmonary embolus. Moderate aortic atherosclerosis. No dissection or aneurysm. CABG with calcification of native coronary arteries. Stable heart size. No significant pericardial effusion/thickening. Mediastinum/Nodes: Scattered mediastinal lymph nodes are not enlarged by size criteria. No hilar adenopathy. Moderate-sized hiatal hernia, increased in size from prior CT. No visualized thyroid nodule. Lungs/Pleura: Breathing motion artifact limits detailed assessment. Minimal bronchial thickening. No acute or focal airspace disease. Subsegmental atelectasis in the lingula and hypoventilatory changes in  the lower lobes. Minimal retained mucus in the trachea. No pleural fluid. Upper Abdomen: Assessed on abdominopelvic CT earlier today. Distended gallbladder again seen with multiple gallstones. Musculoskeletal: Prior median sternotomy. Interval sternal plating from prior CT. Nonunion of prior sternotomy. No erosive or destructive change. No acute osseous abnormalities are seen. Multilevel thoracic spondylosis. Review of the MIP images confirms the above findings. IMPRESSION: 1. No pulmonary embolus or acute intrathoracic abnormality. 2. Moderate-sized hiatal hernia, increased in size from prior CT. 3. Distended gallbladder with cholelithiasis, assessed on abdominal CT earlier today. Aortic Atherosclerosis (ICD10-I70.0). Electronically Signed   By: Keith Rake M.D.   On: 09/27/2020 03:52   US Abdomen Limited  Result Date: 09/27/2020 CLINICAL DATA:  Abnormal CT scan. EXAM: ULTRASOUND ABDOMEN LIMITED RIGHT UPPER QUADRANT COMPARISON:  CT abdomen pelvis 09/27/2020. FINDINGS: Gallbladder: Gallbladder wall thickness is reported at 3 mm. Portions appears somewhat thicker than mass. Pericholecystic fluid is noted. Large shadowing gallstones are present measuring up to 1.6 cm. Common bile duct: Diameter: 2.0 mm, within normal limits. Liver: No focal lesion identified. Within normal limits in parenchymal echogenicity. Portal vein is patent on color Doppler imaging with normal direction of blood flow towards the liver. Other: None. IMPRESSION: 1. Borderline gallbladder wall thickening with pericholecystic fluid and shadowing stones raises concern for  acute cholecystitis. Electronically Signed   By: San Morelle M.D.   On: 09/27/2020 11:40   CT Renal Stone Study  Result Date: 09/27/2020 CLINICAL DATA:  Flank and abdominal pain.  Vomiting. EXAM: CT ABDOMEN AND PELVIS WITHOUT CONTRAST TECHNIQUE: Multidetector CT imaging of the abdomen and pelvis was performed following the standard protocol without IV contrast.  COMPARISON:  11/14/2015 FINDINGS: Lower chest: Minimal lingular atelectasis or scarring. Normal heart size with pacemaker partially included. Small to moderate hiatal hernia. Hepatobiliary: No evidence of focal hepatic lesion on noncontrast exam. Gallbladder is distended with calcified gallstones, including a probable stone in the gallbladder neck. Indistinct fat planes adjacent to the gallbladder suspicious for inflammation. Motion artifact limits detailed assessment. No definite common bile duct dilatation or choledocholithiasis. Pancreas: No ductal dilatation or inflammation. Spleen: Normal in size without focal abnormality. Adrenals/Urinary Tract: No adrenal nodule. Symmetric bilateral perinephric edema, slightly increased from prior exam. No hydronephrosis. No renal calculi. 2.5 cm cyst from the mid lower right kidney. Both ureters are decompressed without stones along the course. Bladder is physiologically distended without wall thickening or bladder stone. Portions of the bladder obscured by streak artifact from left hip arthroplasty. Stomach/Bowel: Moderate hiatal hernia. Decompressed stomach. No small bowel obstruction or inflammation. Normal appendix. Moderate colonic stool burden. Multifocal colonic diverticulosis. No diverticulitis or acute colonic inflammation. Vascular/Lymphatic: Aorto bi-iliac atherosclerosis. No aneurysm. No bulky abdominopelvic adenopathy. Reproductive: Prominent prostate gland. Other: No ascites. No free air or focal fluid collection. Tiny fat containing umbilical hernia. Minimal fat in the inguinal canals. Musculoskeletal: Prominent Schmorl's node versus minimal L1 superior endplate compression deformity, appears chronic. Multilevel degenerative change throughout the spine. Left hip arthroplasty right hip osteoarthritis. No acute osseous abnormalities are seen. IMPRESSION: 1. Distended gallbladder with calcified gallstones and probable stone in the gallbladder neck. Indistinct fat  planes adjacent to the gallbladder suspicious for inflammation. Recommend right upper quadrant ultrasound for further evaluation. 2. No renal stones or obstructive uropathy. 3. Moderate hiatal hernia. Colonic diverticulosis without diverticulitis. Aortic Atherosclerosis (ICD10-I70.0). Electronically Signed   By: Keith Rake M.D.   On: 09/27/2020 02:42        Scheduled Meds: . atorvastatin  80 mg Oral Daily  . metoprolol tartrate  25 mg Oral BID  . nicotine  21 mg Transdermal Daily  . pregabalin  50 mg Oral TID  . sodium chloride flush  3 mL Intravenous Q12H   Continuous Infusions: . piperacillin-tazobactam (ZOSYN)  IV 3.375 g (09/28/20 0457)          Aline August, MD [facility] 09/28/2020, 11:02 AM

## 2020-09-28 NOTE — Progress Notes (Signed)
Assessment & Plan: HD#2 - biliary colic, cholecystitis, cholelithiasis  USN with wall thickening, pericholecystic fluid, gallstones  Plan lap chole on Monday 5/23 - discontinued Brilinta per cardiology  Allow diet today, NPO after midnight CAD/Afib  See cardiology note with clearance        Armandina Gemma, MD       [facility]       Office: [phone]   Chief Complaint: Abdominal pain, biliary colic  Subjective: Patient comfortable this AM, wants to eat.  Denies pain.  Objective: Vital signs in last 24 hours: Temp:  [98 F (36.7 C)-98.5 F (36.9 C)] 98 F (36.7 C) (05/22 0425) Pulse Rate:  [64-82] 74 (05/22 0425) Resp:  [16-20] 16 (05/22 0425) BP: (103-143)/(60-83) 134/83 (05/22 0425) SpO2:  [98 %-100 %] 99 % (05/22 0425)    Intake/Output from previous day: 05/21 0701 - 05/22 0700 In: 916.6 [P.O.:780; IV Piggyback:136.6] Out: -  Intake/Output this shift: No intake/output data recorded.  Physical Exam: HEENT - sclerae clear, mucous membranes moist Neck - soft Abdomen - soft without distension; no mass; no tenderness Ext - no edema, non-tender Neuro - alert & oriented, no focal deficits  Lab Results:  Recent Labs    09/27/20 0154 09/28/20 0549  WBC 8.9 5.7  HGB 11.7* 12.0*  HCT 37.6* 38.5*  PLT 234 225   BMET Recent Labs    09/27/20 0154 09/28/20 0549  NA 137 136  K 4.1 4.1  CL 103 105  CO2 23 24  GLUCOSE 126* 99  BUN 20 12  CREATININE 0.79 0.78  CALCIUM 9.1 8.9   PT/INR No results for input(s): LABPROT, INR in the last 72 hours. Comprehensive Metabolic Panel:    Component Value Date/Time   NA 136 09/28/2020 0549   NA 137 09/27/2020 0154   NA 139 01/25/2020 1137   NA 140 12/21/2019 1608   K 4.1 09/28/2020 0549   K 4.1 09/27/2020 0154   CL 105 09/28/2020 0549   CL 103 09/27/2020 0154   CO2 24 09/28/2020 0549   CO2 23 09/27/2020 0154   BUN 12 09/28/2020 0549   BUN 20 09/27/2020 0154   BUN 14 01/25/2020 1137    BUN 22 12/21/2019 1608   CREATININE 0.78 09/28/2020 0549   CREATININE 0.79 09/27/2020 0154   GLUCOSE 99 09/28/2020 0549   GLUCOSE 126 (H) 09/27/2020 0154   CALCIUM 8.9 09/28/2020 0549   CALCIUM 9.1 09/27/2020 0154   AST 25 09/27/2020 0154   AST 22 07/24/2020 1359   ALT 17 09/27/2020 0154   ALT 19 07/24/2020 1359   ALKPHOS 99 09/27/2020 0154   ALKPHOS 85 07/24/2020 1359   BILITOT 0.3 09/27/2020 0154   BILITOT 1.0 07/24/2020 1359   BILITOT 0.6 12/29/2018 1045   PROT 7.4 09/27/2020 0154   PROT 6.5 07/24/2020 1359   PROT 6.2 12/29/2018 1045   ALBUMIN 4.1 09/27/2020 0154   ALBUMIN 3.9 07/24/2020 1359   ALBUMIN 4.5 12/29/2018 1045    Studies/Results: CT Angio Chest PE W and/or Wo Contrast  Result Date: 09/27/2020 CLINICAL DATA:  Chest pain or SOB, pleurisy or effusion suspected EXAM: CT ANGIOGRAPHY CHEST WITH CONTRAST TECHNIQUE: Multidetector CT imaging of the chest was performed using the standard protocol during bolus administration of intravenous contrast. Multiplanar CT image reconstructions and MIPs were obtained to evaluate the vascular anatomy. CONTRAST:  131mL OMNIPAQUE IOHEXOL 350 MG/ML SOLN COMPARISON:  Radiograph 08/25/2020 chest CT 07/18/2020 FINDINGS: Cardiovascular: There are no filling  defects within the pulmonary arteries to suggest pulmonary embolus. Moderate aortic atherosclerosis. No dissection or aneurysm. CABG with calcification of native coronary arteries. Stable heart size. No significant pericardial effusion/thickening. Mediastinum/Nodes: Scattered mediastinal lymph nodes are not enlarged by size criteria. No hilar adenopathy. Moderate-sized hiatal hernia, increased in size from prior CT. No visualized thyroid nodule. Lungs/Pleura: Breathing motion artifact limits detailed assessment. Minimal bronchial thickening. No acute or focal airspace disease. Subsegmental atelectasis in the lingula and hypoventilatory changes in the lower lobes. Minimal retained mucus in the  trachea. No pleural fluid. Upper Abdomen: Assessed on abdominopelvic CT earlier today. Distended gallbladder again seen with multiple gallstones. Musculoskeletal: Prior median sternotomy. Interval sternal plating from prior CT. Nonunion of prior sternotomy. No erosive or destructive change. No acute osseous abnormalities are seen. Multilevel thoracic spondylosis. Review of the MIP images confirms the above findings. IMPRESSION: 1. No pulmonary embolus or acute intrathoracic abnormality. 2. Moderate-sized hiatal hernia, increased in size from prior CT. 3. Distended gallbladder with cholelithiasis, assessed on abdominal CT earlier today. Aortic Atherosclerosis (ICD10-I70.0). Electronically Signed   By: Keith Rake M.D.   On: 09/27/2020 03:52   US Abdomen Limited  Result Date: 09/27/2020 CLINICAL DATA:  Abnormal CT scan. EXAM: ULTRASOUND ABDOMEN LIMITED RIGHT UPPER QUADRANT COMPARISON:  CT abdomen pelvis 09/27/2020. FINDINGS: Gallbladder: Gallbladder wall thickness is reported at 3 mm. Portions appears somewhat thicker than mass. Pericholecystic fluid is noted. Large shadowing gallstones are present measuring up to 1.6 cm. Common bile duct: Diameter: 2.0 mm, within normal limits. Liver: No focal lesion identified. Within normal limits in parenchymal echogenicity. Portal vein is patent on color Doppler imaging with normal direction of blood flow towards the liver. Other: None. IMPRESSION: 1. Borderline gallbladder wall thickening with pericholecystic fluid and shadowing stones raises concern for acute cholecystitis. Electronically Signed   By: San Morelle M.D.   On: 09/27/2020 11:40   CT Renal Stone Study  Result Date: 09/27/2020 CLINICAL DATA:  Flank and abdominal pain.  Vomiting. EXAM: CT ABDOMEN AND PELVIS WITHOUT CONTRAST TECHNIQUE: Multidetector CT imaging of the abdomen and pelvis was performed following the standard protocol without IV contrast. COMPARISON:  11/14/2015 FINDINGS: Lower chest:  Minimal lingular atelectasis or scarring. Normal heart size with pacemaker partially included. Small to moderate hiatal hernia. Hepatobiliary: No evidence of focal hepatic lesion on noncontrast exam. Gallbladder is distended with calcified gallstones, including a probable stone in the gallbladder neck. Indistinct fat planes adjacent to the gallbladder suspicious for inflammation. Motion artifact limits detailed assessment. No definite common bile duct dilatation or choledocholithiasis. Pancreas: No ductal dilatation or inflammation. Spleen: Normal in size without focal abnormality. Adrenals/Urinary Tract: No adrenal nodule. Symmetric bilateral perinephric edema, slightly increased from prior exam. No hydronephrosis. No renal calculi. 2.5 cm cyst from the mid lower right kidney. Both ureters are decompressed without stones along the course. Bladder is physiologically distended without wall thickening or bladder stone. Portions of the bladder obscured by streak artifact from left hip arthroplasty. Stomach/Bowel: Moderate hiatal hernia. Decompressed stomach. No small bowel obstruction or inflammation. Normal appendix. Moderate colonic stool burden. Multifocal colonic diverticulosis. No diverticulitis or acute colonic inflammation. Vascular/Lymphatic: Aorto bi-iliac atherosclerosis. No aneurysm. No bulky abdominopelvic adenopathy. Reproductive: Prominent prostate gland. Other: No ascites. No free air or focal fluid collection. Tiny fat containing umbilical hernia. Minimal fat in the inguinal canals. Musculoskeletal: Prominent Schmorl's node versus minimal L1 superior endplate compression deformity, appears chronic. Multilevel degenerative change throughout the spine. Left hip arthroplasty right hip osteoarthritis. No acute osseous abnormalities  are seen. IMPRESSION: 1. Distended gallbladder with calcified gallstones and probable stone in the gallbladder neck. Indistinct fat planes adjacent to the gallbladder suspicious  for inflammation. Recommend right upper quadrant ultrasound for further evaluation. 2. No renal stones or obstructive uropathy. 3. Moderate hiatal hernia. Colonic diverticulosis without diverticulitis. Aortic Atherosclerosis (ICD10-I70.0). Electronically Signed   By: Keith Rake M.D.   On: 09/27/2020 02:42      Armandina Gemma 09/28/2020  Patient ID: Daniel [name], male   DOB: Sep 14, 1950, 70 y.o.   MRN: [id number]

## 2020-09-29 ENCOUNTER — Encounter (HOSPITAL_COMMUNITY): Payer: Self-pay | Admitting: Family Medicine

## 2020-09-29 ENCOUNTER — Inpatient Hospital Stay (HOSPITAL_COMMUNITY): Payer: No Typology Code available for payment source | Admitting: Certified Registered"

## 2020-09-29 ENCOUNTER — Inpatient Hospital Stay (HOSPITAL_COMMUNITY): Payer: No Typology Code available for payment source

## 2020-09-29 ENCOUNTER — Encounter (HOSPITAL_COMMUNITY)
Admission: EM | Disposition: A | Discharge status: Home / Self Care | Payer: Self-pay | Source: Home / Self Care | Attending: Internal Medicine

## 2020-09-29 DIAGNOSIS — M869 Osteomyelitis, unspecified: Secondary | ICD-10-CM | POA: Diagnosis not present

## 2020-09-29 DIAGNOSIS — I5032 Chronic diastolic (congestive) heart failure: Secondary | ICD-10-CM | POA: Diagnosis not present

## 2020-09-29 DIAGNOSIS — K8010 Calculus of gallbladder with chronic cholecystitis without obstruction: Secondary | ICD-10-CM | POA: Diagnosis not present

## 2020-09-29 DIAGNOSIS — K810 Acute cholecystitis: Secondary | ICD-10-CM | POA: Diagnosis not present

## 2020-09-29 DIAGNOSIS — E785 Hyperlipidemia, unspecified: Secondary | ICD-10-CM | POA: Diagnosis not present

## 2020-09-29 DIAGNOSIS — I110 Hypertensive heart disease with heart failure: Secondary | ICD-10-CM | POA: Diagnosis not present

## 2020-09-29 DIAGNOSIS — I10 Essential (primary) hypertension: Secondary | ICD-10-CM | POA: Diagnosis not present

## 2020-09-29 HISTORY — PX: CHOLECYSTECTOMY: SHX55

## 2020-09-29 LAB — COMPREHENSIVE METABOLIC PANEL
ALT: 13 U/L (ref 0–44)
AST: 15 U/L (ref 15–41)
Albumin: 3.5 g/dL (ref 3.5–5.0)
Alkaline Phosphatase: 82 U/L (ref 38–126)
Anion gap: 8 (ref 5–15)
BUN: 11 mg/dL (ref 8–23)
CO2: 27 mmol/L (ref 22–32)
Calcium: 9.1 mg/dL (ref 8.9–10.3)
Chloride: 105 mmol/L (ref 98–111)
Creatinine, Ser: 0.8 mg/dL (ref 0.61–1.24)
GFR, Estimated: >60 mL/min (ref >60)
Glucose, Bld: 93 mg/dL (ref 70–99)
Potassium: 3.8 mmol/L (ref 3.5–5.1)
Sodium: 140 mmol/L (ref 135–145)
Total Bilirubin: 0.4 mg/dL (ref 0.3–1.2)
Total Protein: 6.4 g/dL — ABNORMAL LOW (ref 6.5–8.1)

## 2020-09-29 LAB — CBC
HCT: 35.3 % — ABNORMAL LOW (ref 39.0–52.0)
Hemoglobin: 10.9 g/dL — ABNORMAL LOW (ref 13.0–17.0)
MCH: 23.6 pg — ABNORMAL LOW (ref 26.0–34.0)
MCHC: 30.9 g/dL (ref 30.0–36.0)
MCV: 76.4 fL — ABNORMAL LOW (ref 80.0–100.0)
Platelets: 225 10*3/uL (ref 150–400)
RBC: 4.62 MIL/uL (ref 4.22–5.81)
RDW: 16.6 % — ABNORMAL HIGH (ref 11.5–15.5)
WBC: 5 10*3/uL (ref 4.0–10.5)
nRBC: 0 % (ref 0.0–0.2)

## 2020-09-29 LAB — MAGNESIUM: Magnesium: 2.1 mg/dL (ref 1.7–2.4)

## 2020-09-29 SURGERY — LAPAROSCOPIC CHOLECYSTECTOMY WITH INTRAOPERATIVE CHOLANGIOGRAM
Anesthesia: General | Site: Abdomen

## 2020-09-29 MED ORDER — DEXAMETHASONE SODIUM PHOSPHATE 10 MG/ML IJ SOLN
INTRAMUSCULAR | Status: AC
  Filled 2020-09-29: qty 1

## 2020-09-29 MED ORDER — OXYCODONE HCL 5 MG/5ML PO SOLN: 5.0000 mg | Freq: Once | ORAL | Status: DC | PRN

## 2020-09-29 MED ORDER — ONDANSETRON HCL 4 MG/2ML IJ SOLN
INTRAMUSCULAR | Status: AC
  Filled 2020-09-29: qty 2

## 2020-09-29 MED ORDER — OXYCODONE HCL 5 MG PO TABS: 5.0000 mg | ORAL_TABLET | Freq: Once | ORAL | Status: DC | PRN

## 2020-09-29 MED ORDER — LACTATED RINGERS IR SOLN
Status: DC | PRN
  Administered 2020-09-29: 1000 mL

## 2020-09-29 MED ORDER — LIDOCAINE 2% (20 MG/ML) 5 ML SYRINGE
INTRAMUSCULAR | Status: AC
  Filled 2020-09-29: qty 5

## 2020-09-29 MED ORDER — ONDANSETRON HCL 4 MG/2ML IJ SOLN
INTRAMUSCULAR | Status: DC | PRN
  Administered 2020-09-29: 4 mg via INTRAVENOUS

## 2020-09-29 MED ORDER — ACETAMINOPHEN 10 MG/ML IV SOLN
INTRAVENOUS | Status: AC
  Filled 2020-09-29: qty 100

## 2020-09-29 MED ORDER — FENTANYL CITRATE (PF) 100 MCG/2ML IJ SOLN
25.0000 ug | INTRAMUSCULAR | Status: DC | PRN
  Administered 2020-09-29: 50 ug via INTRAVENOUS
  Administered 2020-09-29: 25 ug via INTRAVENOUS

## 2020-09-29 MED ORDER — ROCURONIUM BROMIDE 10 MG/ML (PF) SYRINGE
PREFILLED_SYRINGE | INTRAVENOUS | Status: AC
  Filled 2020-09-29: qty 10

## 2020-09-29 MED ORDER — SODIUM CHLORIDE (PF) 0.9 % IJ SOLN
INTRAMUSCULAR | Status: DC | PRN
  Administered 2020-09-29: 9 mL

## 2020-09-29 MED ORDER — PROMETHAZINE HCL 25 MG/ML IJ SOLN: 6.2500 mg | INTRAMUSCULAR | Status: DC | PRN

## 2020-09-29 MED ORDER — BUPIVACAINE LIPOSOME 1.3 % IJ SUSP
INTRAMUSCULAR | Status: DC | PRN
  Administered 2020-09-29: 20 mL

## 2020-09-29 MED ORDER — LACTATED RINGERS IV SOLN: INTRAVENOUS | Status: DC

## 2020-09-29 MED ORDER — PROPOFOL 10 MG/ML IV BOLUS
INTRAVENOUS | Status: DC | PRN
  Administered 2020-09-29: 150 mg via INTRAVENOUS

## 2020-09-29 MED ORDER — ROCURONIUM BROMIDE 10 MG/ML (PF) SYRINGE
PREFILLED_SYRINGE | INTRAVENOUS | Status: DC | PRN
  Administered 2020-09-29: 50 mg via INTRAVENOUS
  Administered 2020-09-29: 10 mg via INTRAVENOUS

## 2020-09-29 MED ORDER — MIDAZOLAM HCL 2 MG/2ML IJ SOLN
INTRAMUSCULAR | Status: DC | PRN
  Administered 2020-09-29: 2 mg via INTRAVENOUS

## 2020-09-29 MED ORDER — DEXAMETHASONE SODIUM PHOSPHATE 10 MG/ML IJ SOLN
INTRAMUSCULAR | Status: DC | PRN
  Administered 2020-09-29: 8 mg via INTRAVENOUS

## 2020-09-29 MED ORDER — PROMETHAZINE HCL 25 MG/ML IJ SOLN
INTRAMUSCULAR | Status: AC
  Administered 2020-09-29: 6.25 mg via INTRAVENOUS
  Filled 2020-09-29: qty 1

## 2020-09-29 MED ORDER — LIDOCAINE 2% (20 MG/ML) 5 ML SYRINGE
INTRAMUSCULAR | Status: DC | PRN
  Administered 2020-09-29: 60 mg via INTRAVENOUS

## 2020-09-29 MED ORDER — FENTANYL CITRATE (PF) 100 MCG/2ML IJ SOLN
INTRAMUSCULAR | Status: AC
  Administered 2020-09-29: 25 ug via INTRAVENOUS
  Filled 2020-09-29: qty 2

## 2020-09-29 MED ORDER — OXYCODONE HCL 5 MG PO TABS
ORAL_TABLET | ORAL | Status: AC
  Administered 2020-09-29: 5 mg
  Filled 2020-09-29: qty 1

## 2020-09-29 MED ORDER — MIDAZOLAM HCL 2 MG/2ML IJ SOLN
INTRAMUSCULAR | Status: AC
  Filled 2020-09-29: qty 2

## 2020-09-29 MED ORDER — 0.9 % SODIUM CHLORIDE (POUR BTL) OPTIME
TOPICAL | Status: DC | PRN
  Administered 2020-09-29: 1000 mL

## 2020-09-29 MED ORDER — FENTANYL CITRATE (PF) 100 MCG/2ML IJ SOLN
INTRAMUSCULAR | Status: AC
  Administered 2020-09-29: 50 ug via INTRAVENOUS
  Filled 2020-09-29: qty 2

## 2020-09-29 MED ORDER — FENTANYL CITRATE (PF) 100 MCG/2ML IJ SOLN
INTRAMUSCULAR | Status: AC
  Filled 2020-09-29: qty 2

## 2020-09-29 MED ORDER — FENTANYL CITRATE (PF) 100 MCG/2ML IJ SOLN
INTRAMUSCULAR | Status: DC | PRN
  Administered 2020-09-29 (×4): 50 ug via INTRAVENOUS

## 2020-09-29 MED ORDER — PROPOFOL 10 MG/ML IV BOLUS
INTRAVENOUS | Status: AC
  Filled 2020-09-29: qty 20

## 2020-09-29 MED ORDER — SUGAMMADEX SODIUM 200 MG/2ML IV SOLN
INTRAVENOUS | Status: DC | PRN
  Administered 2020-09-29: 170 mg via INTRAVENOUS

## 2020-09-29 MED ORDER — PHENYLEPHRINE 40 MCG/ML (10ML) SYRINGE FOR IV PUSH (FOR BLOOD PRESSURE SUPPORT)
PREFILLED_SYRINGE | INTRAVENOUS | Status: DC | PRN
  Administered 2020-09-29 (×2): 120 ug via INTRAVENOUS
  Administered 2020-09-29: 80 ug via INTRAVENOUS

## 2020-09-29 SURGICAL SUPPLY — 40 items
APPLICATOR COTTON TIP 6 STRL (MISCELLANEOUS) ×2 IMPLANT
APPLICATOR COTTON TIP 6IN STRL (MISCELLANEOUS) IMPLANT
APPLIER CLIP 5 13 M/L LIGAMAX5 (MISCELLANEOUS)
APPLIER CLIP ROT 10 11.4 M/L (STAPLE) ×2
BENZOIN TINCTURE PRP APPL 2/3 (GAUZE/BANDAGES/DRESSINGS) IMPLANT
CABLE HIGH FREQUENCY MONO STRZ (ELECTRODE) IMPLANT
CATH REDDICK CHOLANGI 4FR 50CM (CATHETERS) ×2 IMPLANT
CLIP APPLIE 5 13 M/L LIGAMAX5 (MISCELLANEOUS) IMPLANT
CLIP APPLIE ROT 10 11.4 M/L (STAPLE) IMPLANT
COVER MAYO STAND STRL (DRAPES) ×2 IMPLANT
COVER SURGICAL LIGHT HANDLE (MISCELLANEOUS) ×2 IMPLANT
COVER WAND RF STERILE (DRAPES) IMPLANT
DECANTER SPIKE VIAL GLASS SM (MISCELLANEOUS) ×2 IMPLANT
DERMABOND ADVANCED (GAUZE/BANDAGES/DRESSINGS) ×1
DERMABOND ADVANCED .7 DNX12 (GAUZE/BANDAGES/DRESSINGS) ×1 IMPLANT
DRAPE C-ARM 42X120 X-RAY (DRAPES) ×2 IMPLANT
ELECT L-HOOK LAP 45CM DISP (ELECTROSURGICAL) ×2
ELECT PENCIL ROCKER SW 15FT (MISCELLANEOUS) ×1 IMPLANT
ELECT REM PT RETURN 15FT ADLT (MISCELLANEOUS) ×2 IMPLANT
ELECTRODE L-HOOK LAP 45CM DISP (ELECTROSURGICAL) IMPLANT
GLOVE BIOGEL M 8.0 STRL (GLOVE) ×2 IMPLANT
GOWN STRL REUS W/TWL XL LVL3 (GOWN DISPOSABLE) ×2 IMPLANT
HEMOSTAT SURGICEL 4X8 (HEMOSTASIS) IMPLANT
IV CATH 14GX2 1/4 (CATHETERS) ×2 IMPLANT
KIT BASIN OR (CUSTOM PROCEDURE TRAY) ×2 IMPLANT
KIT TURNOVER KIT A (KITS) ×2 IMPLANT
POUCH RETRIEVAL ECOSAC 10 (ENDOMECHANICALS) IMPLANT
POUCH RETRIEVAL ECOSAC 10MM (ENDOMECHANICALS) ×2
SCISSORS LAP 5X45 EPIX DISP (ENDOMECHANICALS) ×2 IMPLANT
SET IRRIG TUBING LAPAROSCOPIC (IRRIGATION / IRRIGATOR) ×2 IMPLANT
SET TUBE SMOKE EVAC HIGH FLOW (TUBING) ×2 IMPLANT
SLEEVE XCEL OPT CAN 5 100 (ENDOMECHANICALS) ×2 IMPLANT
STRIP CLOSURE SKIN 1/2X4 (GAUZE/BANDAGES/DRESSINGS) IMPLANT
SUT MNCRL AB 4-0 PS2 18 (SUTURE) ×3 IMPLANT
SYR 20ML LL LF (SYRINGE) ×2 IMPLANT
TOWEL OR 17X26 10 PK STRL BLUE (TOWEL DISPOSABLE) ×2 IMPLANT
TRAY LAPAROSCOPIC (CUSTOM PROCEDURE TRAY) ×2 IMPLANT
TROCAR BLADELESS OPT 5 100 (ENDOMECHANICALS) ×2 IMPLANT
TROCAR XCEL BLUNT TIP 100MML (ENDOMECHANICALS) IMPLANT
TROCAR XCEL NON-BLD 11X100MML (ENDOMECHANICALS) ×1 IMPLANT

## 2020-09-29 NOTE — Anesthesia Preprocedure Evaluation (Addendum)
Anesthesia Evaluation  Patient identified by MRN, date of birth, ID band Patient awake    Reviewed: Allergy & Precautions, NPO status , Patient's Chart, lab work & pertinent test results  Airway Mallampati: II  TM Distance: >3 FB Neck ROM: Full    Dental  (+) Teeth Intact   Pulmonary COPD, former smoker, PE   Pulmonary exam normal        Cardiovascular hypertension, Pt. on medications and Pt. 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 Bowel sounds: normal.  Peds  Hematology negative hematology ROS (+)   Anesthesia Other Findings   Reproductive/Obstetrics                            Anesthesia Physical Anesthesia Plan  ASA: III  Anesthesia Plan: General   Post-op Pain Management:    Induction: Intravenous  PONV Risk Score and Plan: 2 and Ondansetron, Dexamethasone and Treatment may vary due to age or medical condition  Airway Management Planned: Mask and Oral ETT  Additional Equipment: None  Intra-op Plan:   Post-operative Plan: Extubation in OR  Informed Consent: I have reviewed the patients History and Physical, chart, labs and discussed the procedure including the risks, benefits and alternatives for the proposed anesthesia with the patient or authorized representative who has indicated his/her understanding and acceptance.     Dental advisory given  Plan Discussed with: CRNA  Anesthesia Plan Comments: (Lab Results      Component                Value               Date                       WBC                      5.0                 09/29/2020                HGB                      10.9 (L)            09/29/2020                HCT                      35.3 (L)            09/29/2020                MCV                      76.4 (L)            09/29/2020                PLT  225                 09/29/2020           Lab Results      Component                Value               Date                      NA                       140                 09/29/2020                K                        3.8                 09/29/2020                CO2                      27                  09/29/2020                GLUCOSE                  93                  09/29/2020                BUN                      11                  09/29/2020                CREATININE               0.80                09/29/2020                CALCIUM                  9.1                 09/29/2020                GFRNONAA                 >60                 09/29/2020                GFRAA                    >60                 02/06/2020           ECHO 03/22: 1. Left ventricular ejection fraction, by estimation, is 55 to 60%. The  left ventricle has normal function. The left ventricle has no regional  wall motion abnormalities. Left ventricular diastolic parameters are  consistent with Grade  I diastolic  dysfunction (impaired relaxation).  2. Right ventricular systolic function is normal. The right ventricular  size is normal.  3. Left atrial size was mildly dilated.  4. The mitral valve is normal in structure. No evidence of mitral valve  regurgitation. No evidence of mitral stenosis.  5. The aortic valve is tricuspid. Aortic valve regurgitation is not  visualized. No aortic stenosis is present. )       Anesthesia Quick Evaluation

## 2020-09-29 NOTE — Discharge Instructions (Signed)
[facility] ° °Please arrive at least 30 min before your appointment to complete your check in paperwork.  If you are unable to arrive 30 min prior to your appointment time we may have to cancel or reschedule you. °LAPAROSCOPIC SURGERY: POST OP INSTRUCTIONS °Always review your discharge instruction sheet given to you by the facility where your surgery was performed. °IF YOU HAVE DISABILITY OR FAMILY LEAVE FORMS, YOU MUST BRING THEM TO THE OFFICE FOR PROCESSING.   °DO NOT GIVE THEM TO YOUR DOCTOR. ° °PAIN CONTROL ° °1. First take acetaminophen (Tylenol) AND/or ibuprofen (Advil) to control your pain after surgery.  Follow directions on package.  Taking acetaminophen (Tylenol) and/or ibuprofen (Advil) regularly after surgery will help to control your pain and lower the amount of prescription pain medication you may need.  You should not take more than 4,000 mg (4 grams) of acetaminophen (Tylenol) in 24 hours.  You should not take ibuprofen (Advil), aleve, motrin, naprosyn or other NSAIDS if you have a history of stomach ulcers or chronic kidney disease.  °2. A prescription for pain medication may be given to you upon discharge.  Take your pain medication as prescribed, if you still have uncontrolled pain after taking acetaminophen (Tylenol) or ibuprofen (Advil). °3. Use ice packs to help control pain. °4. If you need a refill on your pain medication, please contact your pharmacy.  They will contact our office to request authorization. Prescriptions will not be filled after 5pm or on week-ends. ° °HOME MEDICATIONS °5. Take your usually prescribed medications unless otherwise directed. ° °DIET °6. You should follow a light diet the first few days after arrival home.  Be sure to include lots of fluids daily. Avoid fatty, fried foods.  ° °CONSTIPATION °7. It is common to experience some constipation after surgery and if you are taking pain medication.  Increasing fluid intake and taking a stool  softener (such as Colace) will usually help or prevent this problem from occurring.  A mild laxative (Milk of Magnesia or Miralax) should be taken according to package instructions if there are no bowel movements after 48 hours. ° °WOUND/INCISION CARE °8. Most patients will experience some swelling and bruising in the area of the incisions.  Ice packs will help.  Swelling and bruising can take several days to resolve.  °9. Unless discharge instructions indicate otherwise, follow guidelines below  °a. STERI-STRIPS - you may remove your outer bandages 48 hours after surgery, and you may shower at that time.  You have steri-strips (small skin tapes) in place directly over the incision.  These strips should be left on the skin for 7-10 days.   °b. DERMABOND/SKIN GLUE - you may shower in 24 hours.  The glue will flake off over the next 2-3 weeks. °10. Any sutures or staples will be removed at the office during your follow-up visit. ° °ACTIVITIES °11. You may resume regular (light) daily activities beginning the next day--such as daily self-care, walking, climbing stairs--gradually increasing activities as tolerated.  You may have sexual intercourse when it is comfortable.  Refrain from any heavy lifting or straining until approved by your doctor. °a. You may drive when you are no longer taking prescription pain medication, you can comfortably wear a seatbelt, and you can safely maneuver your car and apply brakes. ° °FOLLOW-UP °12. You should see your doctor in the office for a follow-up appointment approximately 2-3 weeks after your surgery.  You should have been given your post-op/follow-up appointment when   your surgery was scheduled.  If you did not receive a post-op/follow-up appointment, make sure that you call for this appointment within a day or two after you arrive home to insure a convenient appointment time. ° °OTHER INSTRUCTIONS ° °WHEN TO CALL YOUR DOCTOR: °1. Fever over 101.0 °2. Inability to  urinate °3. Continued bleeding from incision. °4. Increased pain, redness, or drainage from the incision. °5. Increasing abdominal pain ° °The clinic staff is available to answer your questions during regular business hours.  Please don’t hesitate to call and ask to speak to one of the nurses for clinical concerns.  If you have a medical emergency, go to the nearest emergency room or call 911.  A surgeon from [facility] is always on call at the hospital. °[address], [address], [place], [place]  [address] ? [address] [address], , [place]   [address] °[phone] ? [phone] ? FAX [phone] ° ° ° °

## 2020-09-29 NOTE — Interval H&P Note (Signed)
History and Physical Interval Note:  09/29/2020 8:55 AM  Daniel [name]  has presented today for surgery, with the diagnosis of Acute Cholecystitis.  The various methods of treatment have been discussed with the patient and family. After consideration of risks, benefits and other options for treatment, the patient has consented to  Procedure(s): LAPAROSCOPIC CHOLECYSTECTOMY WITH INTRAOPERATIVE CHOLANGIOGRAM (N/A) as a surgical intervention.  The patient's history has been reviewed, patient examined, no change in status, stable for surgery.  I have reviewed the patient's chart and labs.  Questions were answered to the patient's satisfaction.     Pedro Earls

## 2020-09-29 NOTE — Op Note (Signed)
Daniel [name]  Primary Care Physician:  Lawerance Cruel, MD    09/29/2020  12:16 PM  Procedure: Laparoscopic Cholecystectomy with intraoperative cholangiogram  Surgeon: Catalina Antigua B. Hassell Done, MD, FACS Asst:  none  Anes:  General  Drains:  None  Findings: Acute inflammation in the infundibulum and cystic duct region  Description of Procedure: The patient was taken to OR 1 and given general anesthesia.  The patient was prepped with chlorohexidine prep and draped sterilely. A time out was performed including identifying the patient and discussing their procedure.  Access to the abdomen was achieved with a 5 mm Optiview through the right upper quadrant.  Port placement included three 5 mm trocars and an 11 in the upper midline placed obliquely..    The gallbladder was visualized and appeared chronically inflamed from above but there were adhesions all the way down to the infundibulum which was edematous and stuck..   The fundus of the gallbaldder was grasped and the gallbladder was elevated. Traction on the infundibulum allowed for successful demonstration of the critical view. Inflammatory changes were acute and significant.  This required careful dissection to achieve a critical view since he appeared to have a replaced right hepatic artery crossing over the CBD and giving a branch near the cystic duct.  The cystic duct was identified and clipped up on the gallbladder and an incision was made in the cystic duct and the Reddick catheter was inserted after milking the cystic duct of any mucous and stony debris.  A dynamic cholangiogram was performed which demonstrated filling of the CBD and intrahepatic filling and flow into the duodenum.    The cystic duct was then triple clipped and divided, the cystic artery was double clipped and divided and then the gallbladder was removed from the gallbladder bed. Removal of the gallbladder from the gallbladder bed was performed with control of spillage. .  The  gallbladder was then placed in a bag and brought out through one of the trocar sites. The gallbladder bed was inspected and no bleeding or bile leaks were seen.  Incisions were injected with Exparel and closed with 4-0 Monocryl and Dermabond on the skin.  Sponge and needle count were correct.    The patient was taken to the recovery room in satisfactory condition.  Johnathan Hausen, MD, FACS

## 2020-09-29 NOTE — Anesthesia Procedure Notes (Signed)
Procedure Name: Intubation Date/Time: 09/29/2020 9:38 AM Performed by: Niel Hummer, CRNA Pre-anesthesia Checklist: Patient identified, Suction available, Patient being monitored and Emergency Drugs available Patient Re-evaluated:Patient Re-evaluated prior to induction Oxygen Delivery Method: Circle system utilized Preoxygenation: Pre-oxygenation with 100% oxygen Induction Type: IV induction Ventilation: Mask ventilation without difficulty Laryngoscope Size: Mac and 4 Grade View: Grade I Tube type: Oral Tube size: 7.5 mm Number of attempts: 1 Airway Equipment and Method: Stylet Placement Confirmation: ETT inserted through vocal cords under direct vision,  positive ETCO2 and breath sounds checked- equal and bilateral Secured at: 24 cm Tube secured with: Tape Dental Injury: Teeth and Oropharynx as per pre-operative assessment  Comments: Intubation done by paramedic student Herbie Baltimore. Grade 1 view, tube passed easily.

## 2020-09-29 NOTE — Progress Notes (Signed)
Patient returns from PACU at this time via stretcher.  Patient ambulatory with 1 assist to bed.

## 2020-09-29 NOTE — Progress Notes (Signed)
Patient to OR at this time

## 2020-09-29 NOTE — Progress Notes (Signed)
Patient ID: Daniel [name], male   DOB: 1951/01/20, 70 y.o.   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  On presentation, his lipase was 27, AST of 25, ALT of 17 with negative COVID-19 and flu tests.  CTA chest and CT renal stone study showed distended gallbladder with calcified gallstones and probable stone in the gallbladder neck with indistinct flat planes adjacent to the gallbladder suspicious for inflammation, no renal stones, moderate hiatal hernia, no PE.  He was started on IV antibiotics.  General surgery and cardiology were consulted.  Assessment & Plan:   Possible acute calculus cholecystitis with biliary colic -General surgery following and planning for possible lap chole today.  Continue Zosyn.  Right upper quadrant ultrasound shows possible acute cholecystitis.  LFTs normal on presentation and normal today as well.  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  Cardiology has given clearance for surgery.  Aspirin and Brilinta on hold.  Continue statin and metoprolol  Hypertension -Monitor blood pressure.  Continue metoprolol.  Lisinopril on hold  Chronic diastolic heart failure -Currently compensated.  Continue metoprolol.  Strict input and output.  Cutibacterium acnes sternal osteomyelitis   -treated with 6 weeks of IV Rocephin till 09/12/2020 with plans for suppressive amoxicillin (has not started yet).  Outpatient follow-up with Dr. Juleen China.  History of tobacco use -Uses nicotine patch at home.   DVT prophylaxis: Start heparin Code Status: Full Family Communication: None at bedside Disposition Plan: Status is: Inpatient  Remains inpatient appropriate because:Inpatient level of care appropriate due to severity of illness   Dispo: The patient is from: Home              Anticipated d/c is to: Home              Patient currently is not medically stable to d/c.   Difficult to place patient No  Consultants: Cardiology/general surgery  Procedures: None  Antimicrobials: Zosyn from 09/27/2020 onwards   Subjective: Patient seen and examined at bedside.  No overnight fever, vomiting reported.  Denies worsening shortness breath or chest pain. Objective: Vitals:   09/28/20 0425 09/28/20 1427 09/28/20 2059 09/29/20 0520  BP: 134/83 (!) 141/76 111/90 122/76  Pulse: 74 79 74 72  Resp: 16 19 (!) 24 16  Temp: 98 F (36.7 C) 97.7 F (36.5 C) 98.1 F (36.7 C) 98.2 F (36.8 C)  TempSrc: Oral Oral Oral Oral  SpO2: 99% 100% 97% 97%  Weight:      Height:        Intake/Output Summary (Last 24 hours) at 09/29/2020 0824 Last data filed at 09/29/2020 7026 Gross per 24 hour  Intake 537.56 ml  Output 0 ml  Net 537.56 ml   Filed Weights   09/27/20 0146 09/27/20 0701  Weight: 83 kg 82.1 kg    Examination:  General exam: On room air currently.  No acute distress respiratory system: Decreased  breath sounds at bases bilaterally with some scattered crackles Cardiovascular system: Rate controlled, S1-S2 heard Gastrointestinal system: Abdomen is mildly distended, soft and mildly tender in the right upper quadrant.  Bowel sounds are heard Extremities: No clubbing or cyanosis   Data Reviewed: I have personally reviewed following labs and imaging studies  CBC: Recent Labs   Lab 09/27/20 0154 09/28/20 0549 09/29/20 0525  WBC 8.9 5.7 5.0  NEUTROABS 7.4  --   --   HGB 11.7* 12.0* 10.9*  HCT 37.6* 38.5* 35.3*  MCV 76.1* 76.5* 76.4*  PLT 234 225 242   Basic Metabolic Panel: Recent Labs  Lab 09/27/20 0154 09/28/20 0549 09/29/20 0525  NA 137 136 140  K 4.1 4.1 3.8  CL 103 105 105  CO2 23 24 27   GLUCOSE 126* 99 93  BUN 20 12 11   CREATININE 0.79 0.78 0.80  CALCIUM 9.1 8.9 9.1  MG  --   --  2.1   GFR: Estimated Creatinine Clearance: 92.8 mL/min (by C-G formula based on SCr of 0.8 mg/dL). Liver Function Tests: Recent Labs  Lab 09/27/20 0154 09/29/20 0525  AST 25 15  ALT 17 13  ALKPHOS 99 82  BILITOT 0.3 0.4  PROT 7.4 6.4*  ALBUMIN 4.1 3.5   Recent Labs  Lab 09/27/20 0154  LIPASE 27   No results for input(s): AMMONIA in the last 168 hours. Coagulation Profile: No results for input(s): INR, PROTIME in the last 168 hours. Cardiac Enzymes: No results for input(s): CKTOTAL, CKMB, CKMBINDEX, TROPONINI in the last 168 hours. BNP (last 3 results) No results for input(s): PROBNP in the last 8760 hours. HbA1C: No results for input(s): HGBA1C in the last 72 hours. CBG: No results for input(s): GLUCAP in the last 168 hours. Lipid Profile: No results for input(s): CHOL, HDL, LDLCALC, TRIG, CHOLHDL, LDLDIRECT in the last 72 hours. Thyroid Function Tests: No results for input(s): TSH, T4TOTAL, FREET4, T3FREE, THYROIDAB in the last 72 hours. Anemia Panel: No results for input(s): VITAMINB12, FOLATE, FERRITIN, TIBC, IRON, RETICCTPCT in the last 72 hours. Sepsis Labs: No results for input(s): PROCALCITON, LATICACIDVEN in the last 168 hours.  Recent Results (from the past 240 hour(s))  Resp Panel by RT-PCR (Flu A&B, Covid) Nasopharyngeal Swab     Status: None   Collection Time: 09/27/20  1:54 AM   Specimen: Nasopharyngeal Swab; Nasopharyngeal(NP) swabs in vial transport medium  Result Value Ref Range Status   SARS Coronavirus 2 by RT PCR  NEGATIVE NEGATIVE Final    Comment: (NOTE) SARS-CoV-2 target nucleic acids are NOT DETECTED.  The SARS-CoV-2 RNA is generally detectable in upper respiratory specimens during the acute phase of infection. The lowest concentration of SARS-CoV-2 viral copies this assay can detect is 138 copies/mL. A negative result does not preclude SARS-Cov-2 infection and should not be used as the sole basis for treatment or other patient management decisions. A negative result may occur with  improper specimen collection/handling, submission of specimen other than nasopharyngeal swab, presence of viral mutation(s) within the areas targeted by this assay, and inadequate number of viral copies(<138 copies/mL). A negative result must be combined with clinical observations, patient history, and epidemiological information. The expected result is Negative.  Fact Sheet for Patients:  EntrepreneurPulse.com.au  Fact Sheet for Healthcare Providers:  IncredibleEmployment.be  This test is no t yet approved or cleared by the [place] FDA and  has been authorized for detection and/or diagnosis of SARS-CoV-2 by FDA under an Emergency Use Authorization (EUA). This EUA  will remain  in effect (meaning this test can be used) for the duration of the COVID-19 declaration under Section 564(b)(1) of the Act, 21 U.S.C.section 360bbb-3(b)(1), unless the authorization is terminated  or revoked sooner.       Influenza A by PCR NEGATIVE NEGATIVE Final   Influenza B by PCR NEGATIVE NEGATIVE Final    Comment: (NOTE) The Xpert Xpress SARS-CoV-2/FLU/RSV plus assay is intended as an aid in the diagnosis of influenza from Nasopharyngeal swab specimens and should not be used as a sole basis for treatment. Nasal washings and aspirates are unacceptable for Xpert Xpress SARS-CoV-2/FLU/RSV testing.  Fact Sheet for Patients: EntrepreneurPulse.com.au  Fact Sheet for  Healthcare Providers: IncredibleEmployment.be  This test is not yet approved or cleared by the [place] FDA and has been authorized for detection and/or diagnosis of SARS-CoV-2 by FDA under an Emergency Use Authorization (EUA). This EUA will remain in effect (meaning this test can be used) for the duration of the COVID-19 declaration under Section 564(b)(1) of the Act, 21 U.S.C. section 360bbb-3(b)(1), unless the authorization is terminated or revoked.  Performed at [facility], [address], [place], [place] [address]          Radiology Studies: US Abdomen Limited  Result Date: 09/27/2020 CLINICAL DATA:  Abnormal CT scan. EXAM: ULTRASOUND ABDOMEN LIMITED RIGHT UPPER QUADRANT COMPARISON:  CT abdomen pelvis 09/27/2020. FINDINGS: Gallbladder: Gallbladder wall thickness is reported at 3 mm. Portions appears somewhat thicker than mass. Pericholecystic fluid is noted. Large shadowing gallstones are present measuring up to 1.6 cm. Common bile duct: Diameter: 2.0 mm, within normal limits. Liver: No focal lesion identified. Within normal limits in parenchymal echogenicity. Portal vein is patent on color Doppler imaging with normal direction of blood flow towards the liver. Other: None. IMPRESSION: 1. Borderline gallbladder wall thickening with pericholecystic fluid and shadowing stones raises concern for acute cholecystitis. Electronically Signed   By: San Morelle M.D.   On: 09/27/2020 11:40        Scheduled Meds: . [MAR Hold] atorvastatin  80 mg Oral Daily  . [MAR Hold] heparin injection (subcutaneous)  5,000 Units Subcutaneous Q8H  . [MAR Hold] metoprolol tartrate  25 mg Oral BID  . [MAR Hold] nicotine  21 mg Transdermal Daily  . [MAR Hold] pregabalin  50 mg Oral TID  . [MAR Hold] sodium chloride flush  3 mL Intravenous Q12H   Continuous Infusions: . [MAR Hold] piperacillin-tazobactam (ZOSYN)  IV 3.375 g (09/29/20 0415)           Aline August, MD [facility] 09/29/2020, 8:24 AM

## 2020-09-29 NOTE — Anesthesia Postprocedure Evaluation (Signed)
Anesthesia Post Note  Patient: Daniel [name]  Procedure(s) Performed: LAPAROSCOPIC CHOLECYSTECTOMY WITH INTRAOPERATIVE CHOLANGIOGRAM (N/A Abdomen)     Patient location during evaluation: PACU Anesthesia Type: General Level of consciousness: awake and alert Pain management: pain level controlled Vital Signs Assessment: post-procedure vital signs reviewed and stable Respiratory status: spontaneous breathing, nonlabored ventilation, respiratory function stable and patient connected to nasal cannula oxygen Cardiovascular status: blood pressure returned to baseline and stable Postop Assessment: no apparent nausea or vomiting Anesthetic complications: no   No complications documented.  Last Vitals:  Vitals:   09/29/20 1304 09/29/20 1352  BP:  (!) 166/89  Pulse: 68 75  Resp: 17 18  Temp:  (!) 36.4 C  SpO2: 96% 99%    Last Pain:  Vitals:   09/29/20 1256  TempSrc:   PainSc: Asleep                 Belenda Cruise P Surena Welge

## 2020-09-29 NOTE — Transfer of Care (Signed)
Immediate Anesthesia Transfer of Care Note  Patient: Daniel [name]  Procedure(s) Performed: LAPAROSCOPIC CHOLECYSTECTOMY WITH INTRAOPERATIVE CHOLANGIOGRAM (N/A Abdomen)  Patient Location: PACU  Anesthesia Type:General  Level of Consciousness: awake, alert  and oriented  Airway & Oxygen Therapy: Patient Spontanous Breathing and Patient connected to face mask oxygen  Post-op Assessment: Report given to RN, Post -op Vital signs reviewed and stable and Patient moving all extremities X 4  Post vital signs: Reviewed and stable  Last Vitals:  Vitals Value Taken Time  BP    Temp    Pulse    Resp    SpO2      Last Pain:  Vitals:   09/29/20 0833  TempSrc:   PainSc: 0-No pain      Patients Stated Pain Goal: 3 (80/32/12 2482)  Complications: No complications documented.

## 2020-09-30 ENCOUNTER — Encounter (HOSPITAL_COMMUNITY): Payer: Self-pay | Admitting: Surgery

## 2020-09-30 DIAGNOSIS — I10 Essential (primary) hypertension: Secondary | ICD-10-CM | POA: Diagnosis not present

## 2020-09-30 DIAGNOSIS — I25118 Atherosclerotic heart disease of native coronary artery with other forms of angina pectoris: Secondary | ICD-10-CM | POA: Diagnosis not present

## 2020-09-30 DIAGNOSIS — I5032 Chronic diastolic (congestive) heart failure: Secondary | ICD-10-CM | POA: Diagnosis not present

## 2020-09-30 DIAGNOSIS — K810 Acute cholecystitis: Secondary | ICD-10-CM | POA: Diagnosis not present

## 2020-09-30 LAB — BASIC METABOLIC PANEL
Anion gap: 9 (ref 5–15)
BUN: 8 mg/dL (ref 8–23)
CO2: 25 mmol/L (ref 22–32)
Calcium: 9.1 mg/dL (ref 8.9–10.3)
Chloride: 106 mmol/L (ref 98–111)
Creatinine, Ser: 0.77 mg/dL (ref 0.61–1.24)
GFR, Estimated: >60 mL/min (ref >60)
Glucose, Bld: 107 mg/dL — ABNORMAL HIGH (ref 70–99)
Potassium: 3.9 mmol/L (ref 3.5–5.1)
Sodium: 140 mmol/L (ref 135–145)

## 2020-09-30 LAB — SURGICAL PATHOLOGY

## 2020-09-30 LAB — CBC
HCT: 35.4 % — ABNORMAL LOW (ref 39.0–52.0)
Hemoglobin: 10.9 g/dL — ABNORMAL LOW (ref 13.0–17.0)
MCH: 24 pg — ABNORMAL LOW (ref 26.0–34.0)
MCHC: 30.8 g/dL (ref 30.0–36.0)
MCV: 78 fL — ABNORMAL LOW (ref 80.0–100.0)
Platelets: 238 10*3/uL (ref 150–400)
RBC: 4.54 MIL/uL (ref 4.22–5.81)
RDW: 16.2 % — ABNORMAL HIGH (ref 11.5–15.5)
WBC: 8.6 10*3/uL (ref 4.0–10.5)
nRBC: 0 % (ref 0.0–0.2)

## 2020-09-30 LAB — MAGNESIUM: Magnesium: 1.9 mg/dL (ref 1.7–2.4)

## 2020-09-30 MED ORDER — SENNA 8.6 MG PO TABS: 1.0000 | ORAL_TABLET | Freq: Two times a day (BID) | ORAL | 0 refills | Status: AC

## 2020-09-30 MED ORDER — FLEET ENEMA 7-19 GM/118ML RE ENEM: 1.0000 | ENEMA | Freq: Once | RECTAL | Status: DC | PRN

## 2020-09-30 MED ORDER — OXYCODONE HCL 5 MG PO TABS: 5.0000 mg | ORAL_TABLET | Freq: Four times a day (QID) | ORAL | 0 refills | Status: DC | PRN

## 2020-09-30 MED ORDER — DOCUSATE SODIUM 100 MG PO CAPS
100.0000 mg | ORAL_CAPSULE | Freq: Two times a day (BID) | ORAL | Status: DC
  Administered 2020-09-30: 100 mg via ORAL
  Filled 2020-09-30: qty 1

## 2020-09-30 MED ORDER — POLYETHYLENE GLYCOL 3350 17 G PO PACK
17.0000 g | PACK | Freq: Every day | ORAL | Status: DC
  Administered 2020-09-30: 17 g via ORAL
  Filled 2020-09-30: qty 1

## 2020-09-30 MED ORDER — MORPHINE SULFATE (PF) 2 MG/ML IV SOLN
2.0000 mg | INTRAVENOUS | Status: DC | PRN
Start: 2020-09-30 — End: 2020-09-30

## 2020-09-30 MED ORDER — POLYETHYLENE GLYCOL 3350 17 G PO PACK: 17.0000 g | PACK | Freq: Every day | ORAL | 0 refills | Status: DC | PRN

## 2020-09-30 MED ORDER — OXYCODONE HCL 5 MG PO TABS: 5.0000 mg | ORAL_TABLET | ORAL | Status: DC | PRN

## 2020-09-30 MED ORDER — PSYLLIUM 95 % PO PACK
1.0000 | PACK | Freq: Every day | ORAL | Status: DC | PRN
  Filled 2020-09-30: qty 1

## 2020-09-30 NOTE — Discharge Summary (Signed)
Physician Discharge Summary  Daniel [name] JJO:[id number] DOB: [birth year] DOA: 09/27/2020  PCP: Lawerance Cruel, MD  Admit date: 09/27/2020 Discharge date: 09/30/2020  Admitted From: Home Disposition: Home  Recommendations for Outpatient Follow-up:  1. Follow up with PCP in 1 week with repeat CBC/CMP 2. Outpatient follow-up with general surgery.  Pain management/wound care as per general surgery 3. Outpatient follow-up with ID/cardiology/cardiothoracic surgery as scheduled 4. Patient needs to start amoxicillin on discharge as per ID recommendations from outpatient.  This prescriptions have already been sent to the pharmacy by ID prior to presentation. 5. 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  On presentation, his lipase was 27, AST of 25, ALT of 17 with negative COVID-19 and flu tests.  CTA chest and CT renal stone study showed distended gallbladder with calcified gallstones and probable stone in the gallbladder neck with indistinct flat planes adjacent to the gallbladder suspicious for inflammation, no renal stones, moderate hiatal hernia, no PE.  He was started on IV antibiotics.  General surgery and cardiology were consulted.  Patient was  cleared for surgery by cardiology.  He underwent lap chole on 09/29/2020.  Subsequently, he has remained stable and tolerated diet.  General surgery has cleared the patient for discharge with no need for any antibiotics on discharge.  He will be discharged home today and aspirin will be resumed; Brilinta has already been discontinued as per cardiology recommendations.  Discharge Diagnoses:   Possible acute calculus cholecystitis with biliary colic -Treated with Zosyn.  Right upper quadrant ultrasound shows possible acute cholecystitis.  LFTs normal on presentation and normal on 09/29/2020 as well.  -He underwent lap chole on 09/29/2020.  Subsequently, he has remained stable and tolerated diet.  General surgery has cleared the patient for discharge with no need for any antibiotics on discharge.  He will be discharged home today.  Discharge pain management/wound care as per general surgery recommendations.  Outpatient follow-up with general surgery.  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  Cardiology had given clearance for surgery.    Aspirin will be resumed on discharge; Brilinta has been discontinued by cardiology. Continue statin and metoprolol  Hypertension -Monitor blood pressure.  Continue metoprolol and lisinopril.  Outpatient follow-up  Chronic diastolic heart failure -Currently compensated.    Comply with medications and diet and fluid restriction.  Continue metoprolol and lisinopril.  Outpatient follow-up with cardiology.  Cutibacterium acnes sternal osteomyelitis  -treated with 6 weeks of IV Rocephin till 09/12/2020 with plans for suppressive amoxicillin (has not started yet).  Outpatient follow-up with Dr. Juleen China.  Communicated with Dr. Juleen China on 09/29/2020 via secure chat who recommended that patient start taking amoxicillin upon discharge.  This has been communicated with the patient.  Outpatient follow-up with Dr. Juleen China  History of tobacco  use -Uses nicotine patch at home.   Discharge Instructions  Discharge Instructions    Diet - low sodium heart healthy   Complete by: As directed    Discharge wound care:   Complete by: As directed    As per general surgery recommendations   Increase activity slowly   Complete by: As directed      Allergies as of 09/30/2020      Reactions   Shrimp [shellfish Allergy] Other (See Comments)   "heart attack symptoms", "chest pain, numbness in arms" about 20 years ago      Medication List    STOP taking these medications   acetaminophen 325 MG tablet Commonly known as: TYLENOL   oxyCODONE-acetaminophen 5-325 MG tablet Commonly known as: Percocet   ticagrelor 90 MG Tabs tablet Commonly known as: BRILINTA   traMADol 50 MG tablet Commonly known as: ULTRAM     TAKE these medications   amoxicillin 500 MG capsule Commonly known as: AMOXIL Take 1 capsule (500 mg total) by mouth 3 (three) times daily.   aspirin 81 MG EC tablet Take 1 tablet (81 mg total) by mouth daily. Swallow whole.   atorvastatin 80 MG tablet Commonly known as: LIPITOR TAKE 1 TABLET BY MOUTH EVERY DAY   ibuprofen 200 MG tablet Commonly known as: ADVIL Take 600 mg by mouth every 6 (six) hours as needed for fever, headache or mild pain.   lisinopril 5 MG tablet Commonly known as: ZESTRIL Take 1 tablet (5 mg total) by mouth daily.   metoprolol tartrate 25 MG tablet Commonly known as: LOPRESSOR TAKE 1 TABLET BY MOUTH TWICE A DAY   multivitamin with minerals Tabs tablet Take 1 tablet by mouth daily.   nitroGLYCERIN 0.4 MG SL tablet Commonly known as: NITROSTAT Place 0.4 mg under the tongue every 5 (five) minutes as needed for chest pain.   oxyCODONE 5 MG immediate release tablet Commonly known as: Oxy IR/ROXICODONE Take 1 tablet (5 mg total) by mouth every 6 (six) hours as needed for severe pain. What changed:   when to take this  reasons to take this   polyethylene glycol 17 g  packet Commonly known as: MIRALAX / GLYCOLAX Take 17 g by mouth daily as needed. What changed:   when to take this  reasons to take this   pregabalin 50 MG capsule Commonly known as: Lyrica Take 1 capsule (50 mg total) by mouth 3 (three) times daily.   senna 8.6 MG Tabs tablet Commonly known as: SENOKOT Take 1 tablet (8.6 mg total) by mouth 2 (two) times daily for 10 days.            Discharge Care Instructions  (From admission, onward)         Start     Ordered   09/30/20 0000  Discharge wound care:       Comments: As per general surgery recommendations   09/30/20 0940          [facility]up [facility] Go on 10/21/2020.   Specialty: General Surgery Why: Your appointment is  10/21/20 at 10 am Please arrive 30 minutes prior to your appointment to check in and fill out paperwork. Bring photo ID and insurance information. Contact information: [address] [place] [phone]       Lawerance Cruel, MD. Schedule an appointment as soon as possible for a visit in 1 week(s).   Specialty: Family Medicine Why: with repeat cbc/bmp  Contact information: [place]        Jettie Booze, MD .   Specialties: Cardiology, Radiology, Interventional Cardiology Contact information: [address] [place] [address] [phone]        Mignon Pine, DO. Schedule an appointment as soon as possible for a visit in 1 week(s).   Specialties: Infectious Diseases, Internal Medicine Contact information: [address] [place] [address] [phone]              Allergies  Allergen Reactions  . Shrimp [Shellfish Allergy] Other (See Comments)    "heart attack symptoms", "chest pain, numbness in arms" about 20 years ago    Consultations:  General  surgery/cardiology   Procedures/Studies: DG Cholangiogram Operative  Result Date: 09/29/2020 CLINICAL DATA:  Cholecystectomy for cholelithiasis and acute cholecystitis. EXAM: INTRAOPERATIVE CHOLANGIOGRAM TECHNIQUE: Cholangiographic images from the C-arm fluoroscopic device were submitted for interpretation post-operatively. Please see the procedural report for the amount of contrast and the fluoroscopy time utilized. COMPARISON:  Right upper quadrant ultrasound on 09/27/2020 FINDINGS: Intraoperative cholangiogram with a C-arm demonstrates normal opacified bile ducts with no evidence biliary dilatation or filling defects. There is limited visualization of intrahepatic ducts. Contrast enters the duodenum normally. No contrast extravasation identified. IMPRESSION: Unremarkable intraoperative cholangiogram. Electronically Signed   By: Aletta Edouard M.D.   On: 09/29/2020 11:23   CT Angio Chest PE W and/or Wo Contrast  Result Date: 09/27/2020 CLINICAL DATA:  Chest pain or SOB, pleurisy or effusion suspected EXAM: CT ANGIOGRAPHY CHEST WITH CONTRAST TECHNIQUE: Multidetector CT imaging of the chest was performed using the standard protocol during bolus administration of intravenous contrast. Multiplanar CT image reconstructions and MIPs were obtained to evaluate the vascular anatomy. CONTRAST:  129mL OMNIPAQUE IOHEXOL 350 MG/ML SOLN COMPARISON:  Radiograph 08/25/2020 chest CT 07/18/2020 FINDINGS: Cardiovascular: There are no filling defects within the pulmonary arteries to suggest pulmonary embolus. Moderate aortic atherosclerosis. No dissection or aneurysm. CABG with calcification of native coronary arteries. Stable heart size. No significant pericardial effusion/thickening. Mediastinum/Nodes: Scattered mediastinal lymph nodes are not enlarged by size criteria. No hilar adenopathy. Moderate-sized hiatal hernia, increased in size from prior CT. No visualized thyroid nodule. Lungs/Pleura: Breathing motion artifact  limits detailed assessment. Minimal bronchial thickening. No acute or focal airspace disease. Subsegmental atelectasis in the lingula and hypoventilatory changes in the lower lobes. Minimal retained mucus in the trachea. No pleural fluid. Upper Abdomen: Assessed on abdominopelvic CT earlier today. Distended gallbladder again seen with multiple gallstones. Musculoskeletal: Prior median sternotomy. Interval sternal plating from prior CT. Nonunion of prior sternotomy. No erosive or destructive change. No acute osseous abnormalities are seen. Multilevel thoracic spondylosis. Review of the MIP images confirms the above findings. IMPRESSION: 1. No pulmonary embolus or acute intrathoracic abnormality. 2. Moderate-sized hiatal hernia, increased in size from prior CT. 3. Distended gallbladder with cholelithiasis, assessed on abdominal CT earlier today. Aortic Atherosclerosis (ICD10-I70.0). Electronically Signed   By: Keith Rake M.D.   On: 09/27/2020 03:52   US Abdomen Limited  Result Date: 09/27/2020 CLINICAL DATA:  Abnormal CT scan. EXAM: ULTRASOUND ABDOMEN LIMITED RIGHT UPPER QUADRANT COMPARISON:  CT abdomen pelvis 09/27/2020. FINDINGS: Gallbladder: Gallbladder wall thickness is reported at 3 mm. Portions appears somewhat thicker than mass. Pericholecystic fluid is noted. Large shadowing gallstones are present measuring up to 1.6 cm. Common bile duct: Diameter: 2.0 mm, within normal limits. Liver: No focal lesion identified. Within normal limits in parenchymal echogenicity. Portal vein is patent on color Doppler imaging with  normal direction of blood flow towards the liver. Other: None. IMPRESSION: 1. Borderline gallbladder wall thickening with pericholecystic fluid and shadowing stones raises concern for acute cholecystitis. Electronically Signed   By: San Morelle M.D.   On: 09/27/2020 11:40   CT Renal Stone Study  Result Date: 09/27/2020 CLINICAL DATA:  Flank and abdominal pain.  Vomiting. EXAM: CT  ABDOMEN AND PELVIS WITHOUT CONTRAST TECHNIQUE: Multidetector CT imaging of the abdomen and pelvis was performed following the standard protocol without IV contrast. COMPARISON:  11/14/2015 FINDINGS: Lower chest: Minimal lingular atelectasis or scarring. Normal heart size with pacemaker partially included. Small to moderate hiatal hernia. Hepatobiliary: No evidence of focal hepatic lesion on noncontrast exam. Gallbladder is distended with calcified gallstones, including a probable stone in the gallbladder neck. Indistinct fat planes adjacent to the gallbladder suspicious for inflammation. Motion artifact limits detailed assessment. No definite common bile duct dilatation or choledocholithiasis. Pancreas: No ductal dilatation or inflammation. Spleen: Normal in size without focal abnormality. Adrenals/Urinary Tract: No adrenal nodule. Symmetric bilateral perinephric edema, slightly increased from prior exam. No hydronephrosis. No renal calculi. 2.5 cm cyst from the mid lower right kidney. Both ureters are decompressed without stones along the course. Bladder is physiologically distended without wall thickening or bladder stone. Portions of the bladder obscured by streak artifact from left hip arthroplasty. Stomach/Bowel: Moderate hiatal hernia. Decompressed stomach. No small bowel obstruction or inflammation. Normal appendix. Moderate colonic stool burden. Multifocal colonic diverticulosis. No diverticulitis or acute colonic inflammation. Vascular/Lymphatic: Aorto bi-iliac atherosclerosis. No aneurysm. No bulky abdominopelvic adenopathy. Reproductive: Prominent prostate gland. Other: No ascites. No free air or focal fluid collection. Tiny fat containing umbilical hernia. Minimal fat in the inguinal canals. Musculoskeletal: Prominent Schmorl's node versus minimal L1 superior endplate compression deformity, appears chronic. Multilevel degenerative change throughout the spine. Left hip arthroplasty right hip  osteoarthritis. No acute osseous abnormalities are seen. IMPRESSION: 1. Distended gallbladder with calcified gallstones and probable stone in the gallbladder neck. Indistinct fat planes adjacent to the gallbladder suspicious for inflammation. Recommend right upper quadrant ultrasound for further evaluation. 2. No renal stones or obstructive uropathy. 3. Moderate hiatal hernia. Colonic diverticulosis without diverticulitis. Aortic Atherosclerosis (ICD10-I70.0). Electronically Signed   By: Keith Rake M.D.   On: 09/27/2020 02:42       Subjective: Patient seen and examined at bedside.  Feels much better and wants to go home today.  Denies worsening abdominal pain, fever or vomiting.  Discharge Exam: Vitals:   09/29/20 2122 09/30/20 0514  BP: (!) 145/80 (!) 154/66  Pulse: 84 75  Resp: 16 20  Temp: 98.4 F (36.9 C) 98.3 F (36.8 C)  SpO2: 100% 97%    General: Pt is alert, awake, not in acute distress Cardiovascular: rate controlled, S1/S2 + Respiratory: bilateral decreased breath sounds at bases Abdominal: Soft, NT, ND, bowel sounds +, lap chole incisions look clean and dry Extremities: no edema, no cyanosis    The results of significant diagnostics from this hospitalization (including imaging, microbiology, ancillary and laboratory) are listed below for reference.     Microbiology: Recent Results (from the past 240 hour(s))  Resp Panel by RT-PCR (Flu A&B, Covid) Nasopharyngeal Swab     Status: None   Collection Time: 09/27/20  1:54 AM   Specimen: Nasopharyngeal Swab; Nasopharyngeal(NP) swabs in vial transport medium  Result Value Ref Range Status   SARS Coronavirus 2 by RT PCR NEGATIVE NEGATIVE Final    Comment: (NOTE) SARS-CoV-2 target nucleic acids are NOT DETECTED.  The SARS-CoV-2 RNA  is generally detectable in upper respiratory specimens during the acute phase of infection. The lowest concentration of SARS-CoV-2 viral copies this assay can detect is 138 copies/mL. A  negative result does not preclude SARS-Cov-2 infection and should not be used as the sole basis for treatment or other patient management decisions. A negative result may occur with  improper specimen collection/handling, submission of specimen other than nasopharyngeal swab, presence of viral mutation(s) within the areas targeted by this assay, and inadequate number of viral copies(<138 copies/mL). A negative result must be combined with clinical observations, patient history, and epidemiological information. The expected result is Negative.  Fact Sheet for Patients:  EntrepreneurPulse.com.au  Fact Sheet for Healthcare Providers:  IncredibleEmployment.be  This test is no t yet approved or cleared by the [place] FDA and  has been authorized for detection and/or diagnosis of SARS-CoV-2 by FDA under an Emergency Use Authorization (EUA). This EUA will remain  in effect (meaning this test can be used) for the duration of the COVID-19 declaration under Section 564(b)(1) of the Act, 21 U.S.C.section 360bbb-3(b)(1), unless the authorization is terminated  or revoked sooner.       Influenza A by PCR NEGATIVE NEGATIVE Final   Influenza B by PCR NEGATIVE NEGATIVE Final    Comment: (NOTE) The Xpert Xpress SARS-CoV-2/FLU/RSV plus assay is intended as an aid in the diagnosis of influenza from Nasopharyngeal swab specimens and should not be used as a sole basis for treatment. Nasal washings and aspirates are unacceptable for Xpert Xpress SARS-CoV-2/FLU/RSV testing.  Fact Sheet for Patients: EntrepreneurPulse.com.au  Fact Sheet for Healthcare Providers: IncredibleEmployment.be  This test is not yet approved or cleared by the [place] FDA and has been authorized for detection and/or diagnosis of SARS-CoV-2 by FDA under an Emergency Use Authorization (EUA). This EUA will remain in effect (meaning this test can  be used) for the duration of the COVID-19 declaration under Section 564(b)(1) of the Act, 21 U.S.C. section 360bbb-3(b)(1), unless the authorization is terminated or revoked.  Performed at [facility], [address] [place], [place] [address]      Labs: BNP (last 3 results) Recent Labs    03/03/20 0820  BNP 010.9*   Basic Metabolic Panel: Recent Labs  Lab 09/27/20 0154 09/28/20 0549 09/29/20 0525 09/30/20 0506  NA 137 136 140 140  K 4.1 4.1 3.8 3.9  CL 103 105 105 106  CO2 23 24 27 25   GLUCOSE 126* 99 93 107*  BUN 20 12 11 8   CREATININE 0.79 0.78 0.80 0.77  CALCIUM 9.1 8.9 9.1 9.1  MG  --   --  2.1 1.9   Liver Function Tests: Recent Labs  Lab 09/27/20 0154 09/29/20 0525  AST 25 15  ALT 17 13  ALKPHOS 99 82  BILITOT 0.3 0.4  PROT 7.4 6.4*  ALBUMIN 4.1 3.5   Recent Labs  Lab 09/27/20 0154  LIPASE 27   No results for input(s): AMMONIA in the last 168 hours. CBC: Recent Labs  Lab 09/27/20 0154 09/28/20 0549 09/29/20 0525 09/30/20 0506  WBC 8.9 5.7 5.0 8.6  NEUTROABS 7.4  --   --   --   HGB 11.7* 12.0* 10.9* 10.9*  HCT 37.6* 38.5* 35.3* 35.4*  MCV 76.1* 76.5* 76.4* 78.0*  PLT 234 225 225 238   Cardiac Enzymes: No results for input(s): CKTOTAL, CKMB, CKMBINDEX, TROPONINI in the last 168 hours. BNP: Invalid input(s): POCBNP CBG: No results for input(s): GLUCAP in the last 168  hours. D-Dimer No results for input(s): DDIMER in the last 72 hours. Hgb A1c No results for input(s): HGBA1C in the last 72 hours. Lipid Profile No results for input(s): CHOL, HDL, LDLCALC, TRIG, CHOLHDL, LDLDIRECT in the last 72 hours. Thyroid function studies No results for input(s): TSH, T4TOTAL, T3FREE, THYROIDAB in the last 72 hours.  Invalid input(s): FREET3 Anemia work up No results for input(s): VITAMINB12, FOLATE, FERRITIN, TIBC, IRON, RETICCTPCT in the last 72 hours. Urinalysis    Component Value Date/Time   COLORURINE YELLOW 09/27/2020 0520    APPEARANCEUR CLEAR 09/27/2020 0520   APPEARANCEUR Cloudy (A) 07/10/2020 1645   LABSPEC <1.005 (L) 09/27/2020 0520   PHURINE 6.0 09/27/2020 0520   GLUCOSEU NEGATIVE 09/27/2020 0520   HGBUR NEGATIVE 09/27/2020 0520   BILIRUBINUR NEGATIVE 09/27/2020 0520   BILIRUBINUR Negative 07/10/2020 1645   KETONESUR NEGATIVE 09/27/2020 0520   PROTEINUR NEGATIVE 09/27/2020 0520   UROBILINOGEN 1.0 07/27/2014 1827   NITRITE NEGATIVE 09/27/2020 0520   LEUKOCYTESUR NEGATIVE 09/27/2020 0520   Sepsis Labs Invalid input(s): PROCALCITONIN,  WBC,  LACTICIDVEN Microbiology Recent Results (from the past 240 hour(s))  Resp Panel by RT-PCR (Flu A&B, Covid) Nasopharyngeal Swab     Status: None   Collection Time: 09/27/20  1:54 AM   Specimen: Nasopharyngeal Swab; Nasopharyngeal(NP) swabs in vial transport medium  Result Value Ref Range Status   SARS Coronavirus 2 by RT PCR NEGATIVE NEGATIVE Final    Comment: (NOTE) SARS-CoV-2 target nucleic acids are NOT DETECTED.  The SARS-CoV-2 RNA is generally detectable in upper respiratory specimens during the acute phase of infection. The lowest concentration of SARS-CoV-2 viral copies this assay can detect is 138 copies/mL. A negative result does not preclude SARS-Cov-2 infection and should not be used as the sole basis for treatment or other patient management decisions. A negative result may occur with  improper specimen collection/handling, submission of specimen other than nasopharyngeal swab, presence of viral mutation(s) within the areas targeted by this assay, and inadequate number of viral copies(<138 copies/mL). A negative result must be combined with clinical observations, patient history, and epidemiological information. The expected result is Negative.  Fact Sheet for Patients:  EntrepreneurPulse.com.au  Fact Sheet for Healthcare Providers:  IncredibleEmployment.be  This test is no t yet approved or cleared by  the [place] FDA and  has been authorized for detection and/or diagnosis of SARS-CoV-2 by FDA under an Emergency Use Authorization (EUA). This EUA will remain  in effect (meaning this test can be used) for the duration of the COVID-19 declaration under Section 564(b)(1) of the Act, 21 U.S.C.section 360bbb-3(b)(1), unless the authorization is terminated  or revoked sooner.       Influenza A by PCR NEGATIVE NEGATIVE Final   Influenza B by PCR NEGATIVE NEGATIVE Final    Comment: (NOTE) The Xpert Xpress SARS-CoV-2/FLU/RSV plus assay is intended as an aid in the diagnosis of influenza from Nasopharyngeal swab specimens and should not be used as a sole basis for treatment. Nasal washings and aspirates are unacceptable for Xpert Xpress SARS-CoV-2/FLU/RSV testing.  Fact Sheet for Patients: EntrepreneurPulse.com.au  Fact Sheet for Healthcare Providers: IncredibleEmployment.be  This test is not yet approved or cleared by the [place] FDA and has been authorized for detection and/or diagnosis of SARS-CoV-2 by FDA under an Emergency Use Authorization (EUA). This EUA will remain in effect (meaning this test can be used) for the duration of the COVID-19 declaration under Section 564(b)(1) of the Act, 21 U.S.C. section 360bbb-3(b)(1), unless the authorization is  terminated or revoked.  Performed at [facility], [address] [place], [place] [address]      Time coordinating discharge: 35 minutes  SIGNED:   Aline August, MD  [facility] 09/30/2020, 10:35 AM

## 2020-09-30 NOTE — Plan of Care (Signed)
  Problem: Education: Goal: Knowledge of General Education information will improve Description: Including pain rating scale, medication(s)/side effects and non-pharmacologic comfort measures Outcome: Adequate for Discharge   

## 2020-09-30 NOTE — Progress Notes (Signed)
[facility] Progress Note  1 Day Post-Op  Subjective: CC-  Abdomen sore but pain improved. Tolerating diet, denies n/v. Passing flatus but has not had a BM in 4 days.  Objective: Vital signs in last 24 hours: Temp:  [97.4 F (36.3 C)-98.4 F (36.9 C)] 98.3 F (36.8 C) (05/24 0514) Pulse Rate:  [62-84] 75 (05/24 0514) Resp:  [12-25] 20 (05/24 0514) BP: (145-185)/(66-89) 154/66 (05/24 0514) SpO2:  [96 %-100 %] 97 % (05/24 0514) Last BM Date: 09/26/20  Intake/Output from previous day: 05/23 0701 - 05/24 0700 In: 1679.2 [P.O.:240; I.V.:1253; IV Piggyback:186.2] Out: 5 [Blood:5] Intake/Output this shift: No intake/output data recorded.  PE: Gen:  Alert, NAD, pleasant Pulm: rate and effort normal Abd: Soft, NT/ND, +BS, lap incisions C/D/I  Lab Results:  Recent Labs    09/29/20 0525 09/30/20 0506  WBC 5.0 8.6  HGB 10.9* 10.9*  HCT 35.3* 35.4*  PLT 225 238   BMET Recent Labs    09/29/20 0525 09/30/20 0506  NA 140 140  K 3.8 3.9  CL 105 106  CO2 27 25  GLUCOSE 93 107*  BUN 11 8  CREATININE 0.80 0.77  CALCIUM 9.1 9.1   PT/INR No results for input(s): LABPROT, INR in the last 72 hours. CMP     Component Value Date/Time   NA 140 09/30/2020 0506   NA 139 01/25/2020 1137   K 3.9 09/30/2020 0506   CL 106 09/30/2020 0506   CO2 25 09/30/2020 0506   GLUCOSE 107 (H) 09/30/2020 0506   BUN 8 09/30/2020 0506   BUN 14 01/25/2020 1137   CREATININE 0.77 09/30/2020 0506   CALCIUM 9.1 09/30/2020 0506   PROT 6.4 (L) 09/29/2020 0525   PROT 6.2 12/29/2018 1045   ALBUMIN 3.5 09/29/2020 0525   ALBUMIN 4.5 12/29/2018 1045   AST 15 09/29/2020 0525   ALT 13 09/29/2020 0525   ALKPHOS 82 09/29/2020 0525   BILITOT 0.4 09/29/2020 0525   BILITOT 0.6 12/29/2018 1045   GFRNONAA >60 09/30/2020 0506   GFRAA >60 02/06/2020 0459   Lipase     Component Value Date/Time   LIPASE 27 09/27/2020 0154       Studies/Results: DG Cholangiogram Operative  Result  Date: 09/29/2020 CLINICAL DATA:  Cholecystectomy for cholelithiasis and acute cholecystitis. EXAM: INTRAOPERATIVE CHOLANGIOGRAM TECHNIQUE: Cholangiographic images from the C-arm fluoroscopic device were submitted for interpretation post-operatively. Please see the procedural report for the amount of contrast and the fluoroscopy time utilized. COMPARISON:  Right upper quadrant ultrasound on 09/27/2020 FINDINGS: Intraoperative cholangiogram with a C-arm demonstrates normal opacified bile ducts with no evidence biliary dilatation or filling defects. There is limited visualization of intrahepatic ducts. Contrast enters the duodenum normally. No contrast extravasation identified. IMPRESSION: Unremarkable intraoperative cholangiogram. Electronically Signed   By: Aletta Edouard M.D.   On: 09/29/2020 11:23    Anti-infectives: Anti-infectives (From admission, onward)   Start     Dose/Rate Route Frequency Ordered Stop   09/27/20 1200  piperacillin-tazobactam (ZOSYN) IVPB 3.375 g        3.375 g 12.5 mL/hr over 240 Minutes Intravenous Every 8 hours 09/27/20 1037     09/27/20 0530  piperacillin-tazobactam (ZOSYN) IVPB 3.375 g        3.375 g 100 mL/hr over 30 Minutes Intravenous  Once 09/27/20 0527 09/27/20 9163       Assessment/Plan  Acute cholecystitis S/p laparoscopic cholecystectomy with IOC 5/23 Dr. Hassell Done - POD#1 - Advance diet. Add daily miralax/colace, enema if no results  with PO bowel regimen. Mobilize. Patient does not have to have a BM prior to discharge, he can continue bowel regimen at home. He does not need any more antibiotics from our standpoint. Parcoal for discharge. Discharge instructions and follow up info on AVS.  I will send rx for oxycodone to his pharmacy.   ID - zosyn 5/21>> FEN - reg diet VTE - SCDs, sq heparin Foley - none Follow up - [facility] clinic   LOS: 3 days    Wellington [place], [facility] 09/30/2020, 8:11 AM Please see Amion for pager number during day  hours 7:00am-4:30pm

## 2020-09-30 NOTE — Progress Notes (Signed)
Discharge instructions given with stated understanding.  Patient waiting for transportation home at this time 

## 2020-10-29 ENCOUNTER — Other Ambulatory Visit: Payer: Self-pay | Admitting: Physician Assistant

## 2020-11-20 ENCOUNTER — Encounter (HOSPITAL_BASED_OUTPATIENT_CLINIC_OR_DEPARTMENT_OTHER): Payer: Self-pay

## 2020-11-20 ENCOUNTER — Other Ambulatory Visit: Payer: Self-pay

## 2020-11-20 ENCOUNTER — Emergency Department (HOSPITAL_BASED_OUTPATIENT_CLINIC_OR_DEPARTMENT_OTHER)
Admission: EM | Admit: 2020-11-20 | Discharge: 2020-11-20 | Disposition: A | Discharge status: Home / Self Care | Payer: No Typology Code available for payment source | Attending: Emergency Medicine | Admitting: Emergency Medicine

## 2020-11-20 ENCOUNTER — Other Ambulatory Visit (HOSPITAL_BASED_OUTPATIENT_CLINIC_OR_DEPARTMENT_OTHER): Payer: Self-pay

## 2020-11-20 ENCOUNTER — Emergency Department (HOSPITAL_BASED_OUTPATIENT_CLINIC_OR_DEPARTMENT_OTHER): Payer: No Typology Code available for payment source

## 2020-11-20 DIAGNOSIS — F17210 Nicotine dependence, cigarettes, uncomplicated: Secondary | ICD-10-CM | POA: Insufficient documentation

## 2020-11-20 DIAGNOSIS — I5032 Chronic diastolic (congestive) heart failure: Secondary | ICD-10-CM | POA: Insufficient documentation

## 2020-11-20 DIAGNOSIS — M25552 Pain in left hip: Secondary | ICD-10-CM

## 2020-11-20 DIAGNOSIS — Z79899 Other long term (current) drug therapy: Secondary | ICD-10-CM | POA: Insufficient documentation

## 2020-11-20 DIAGNOSIS — I2510 Atherosclerotic heart disease of native coronary artery without angina pectoris: Secondary | ICD-10-CM | POA: Diagnosis not present

## 2020-11-20 DIAGNOSIS — I110 Hypertensive heart disease with heart failure: Secondary | ICD-10-CM | POA: Insufficient documentation

## 2020-11-20 DIAGNOSIS — Z951 Presence of aortocoronary bypass graft: Secondary | ICD-10-CM | POA: Insufficient documentation

## 2020-11-20 DIAGNOSIS — Z85828 Personal history of other malignant neoplasm of skin: Secondary | ICD-10-CM | POA: Diagnosis not present

## 2020-11-20 DIAGNOSIS — J449 Chronic obstructive pulmonary disease, unspecified: Secondary | ICD-10-CM | POA: Diagnosis not present

## 2020-11-20 DIAGNOSIS — Z7982 Long term (current) use of aspirin: Secondary | ICD-10-CM | POA: Insufficient documentation

## 2020-11-20 MED ORDER — OXYCODONE HCL 5 MG PO TABS
5.0000 mg | ORAL_TABLET | Freq: Once | ORAL | Status: AC
  Administered 2020-11-20: 5 mg via ORAL
  Filled 2020-11-20: qty 1

## 2020-11-20 MED ORDER — DIAZEPAM 5 MG PO TABS
5.0000 mg | ORAL_TABLET | Freq: Once | ORAL | Status: AC
  Administered 2020-11-20: 5 mg via ORAL
  Filled 2020-11-20: qty 1

## 2020-11-20 MED ORDER — OXYCODONE HCL 5 MG PO TABS
5.0000 mg | ORAL_TABLET | Freq: Four times a day (QID) | ORAL | 0 refills | Status: DC | PRN
  Filled 2020-11-20: qty 20, 5d supply, fill #0

## 2020-11-20 NOTE — ED Notes (Signed)
Called Dr Magda Bernheim for on call person through [facility]  [phone] PAL [phone]

## 2020-11-20 NOTE — ED Notes (Signed)
ED Provider at bedside. 

## 2020-11-20 NOTE — ED Triage Notes (Signed)
Pt arrives with c/o pain to left hip starting last night pt reports history of multiple surgeries on left hip.

## 2020-11-20 NOTE — ED Notes (Signed)
Patient transported to X-ray 

## 2020-11-20 NOTE — Discharge Instructions (Addendum)
Call your orthopedic office and schedule an appointment for follow-up.  There appears to be likely some loosening of the hardware in your hip prosthesis is likely causing your pain.  Please use your wheelchair at all times.  Use Roxicodone for any breakthrough pain.
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
## 2020-11-26 DIAGNOSIS — S76019A Strain of muscle, fascia and tendon of unspecified hip, initial encounter: Secondary | ICD-10-CM | POA: Diagnosis not present

## 2020-11-26 DIAGNOSIS — M1611 Unilateral primary osteoarthritis, right hip: Secondary | ICD-10-CM | POA: Diagnosis not present

## 2020-11-26 DIAGNOSIS — Z96649 Presence of unspecified artificial hip joint: Secondary | ICD-10-CM | POA: Diagnosis not present

## 2020-11-26 DIAGNOSIS — Z471 Aftercare following joint replacement surgery: Secondary | ICD-10-CM | POA: Diagnosis not present

## 2020-11-26 DIAGNOSIS — Z96642 Presence of left artificial hip joint: Secondary | ICD-10-CM | POA: Diagnosis not present

## 2020-12-12 ENCOUNTER — Ambulatory Visit: Payer: No Typology Code available for payment source | Admitting: Internal Medicine
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
## 2021-01-01 ENCOUNTER — Other Ambulatory Visit: Payer: Self-pay | Admitting: Physician Assistant

## 2021-01-01 ENCOUNTER — Other Ambulatory Visit: Payer: Self-pay | Admitting: Interventional Cardiology

## 2021-01-01 NOTE — Telephone Encounter (Signed)
Pts med list indicates he should be taking lisinopril 5 mg po daily.  Please refill.

## 2021-01-01 NOTE — Telephone Encounter (Signed)
Patient's Pharmacy is requesting Lisinopril 5 mg. Patient medication list Lisinopril 2.5 mg. Please advise. Thank you

## 2021-01-13 ENCOUNTER — Telehealth: Payer: Self-pay | Admitting: Interventional Cardiology

## 2021-01-13 NOTE — Telephone Encounter (Signed)
Called pt back in regards to referral for depression.  He reports that he maybe close to a nervous breakdown.  I advised pt to call 911 if he has any thoughts of harming himself.  He reports that he doesn't want to go to the ED.  I then advised him to call his PCP and ask for an urgent referral for depression.  He expressed that he would call PCP.  I advised him to call back with any other questions or concerns.

## 2021-01-13 NOTE — Telephone Encounter (Signed)
New message:     Patient calling to see if he can get a referral for depression. Dr. Merian Capron phone number [place].
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
## 2021-01-19 ENCOUNTER — Ambulatory Visit: Payer: No Typology Code available for payment source | Admitting: Interventional Cardiology

## 2021-01-19 DIAGNOSIS — I2510 Atherosclerotic heart disease of native coronary artery without angina pectoris: Secondary | ICD-10-CM

## 2021-01-19 DIAGNOSIS — Z951 Presence of aortocoronary bypass graft: Secondary | ICD-10-CM

## 2021-01-19 DIAGNOSIS — I10 Essential (primary) hypertension: Secondary | ICD-10-CM

## 2021-01-19 DIAGNOSIS — E782 Mixed hyperlipidemia: Secondary | ICD-10-CM

## 2021-01-19 DIAGNOSIS — Z720 Tobacco use: Secondary | ICD-10-CM

## 2021-02-02 ENCOUNTER — Telehealth: Payer: Self-pay

## 2021-02-02 ENCOUNTER — Other Ambulatory Visit: Payer: Self-pay | Admitting: Physician Assistant

## 2021-02-02 ENCOUNTER — Other Ambulatory Visit: Payer: Self-pay | Admitting: Interventional Cardiology

## 2021-02-02 NOTE — Telephone Encounter (Signed)
Pts wife called regarding pts medication. They are unsure if he should be taking Brilinta or Plavix. Neither medications are on pts med list. Please address.

## 2021-02-02 NOTE — Telephone Encounter (Signed)
I do not see DPR in chart allowing [place] to speak with patient's wife.  I placed call to patient.  His wife answered the phone.  Patient is unavailable.  I explained I was unable to discuss patient care with her as there was no DPR on file and that if patient called office tomorrow and gave permission we could speak with her.  She reports she has no questions at this time

## 2021-02-05 ENCOUNTER — Telehealth: Payer: Self-pay | Admitting: Interventional Cardiology

## 2021-02-05 NOTE — Telephone Encounter (Signed)
Chart reviewed. Patient was on Plavix when he saw Dr Irish Lack 07/10/20. No med changes made at this visit. Hospital discharge note from 07/31/20 indicates patient was on Brilinta not Plavix.  Per Dr Jackalyn Lombard note from 09/27/20 Brilinta was stopped and patient to resume ASA when stable.  I placed [facility] calls to [facility] but was on hold for several minutes.  Will continue to try and reach [facility]

## 2021-02-05 NOTE — Telephone Encounter (Signed)
  Pt c/o medication issue:  1. Name of Medication: plavix 75 mg  2. How are you currently taking this medication (dosage and times per day)? 1 tablet by mouth a day  3. Are you having a reaction (difficulty breathing--STAT)?   4. What is your medication issue? [facility], she said pt needs refill for plavix, its not on pt's med list

## 2021-02-05 NOTE — Telephone Encounter (Signed)
I spoke with [facility] and told them Clopidogrel is not on patient's medication list

## 2021-02-17 NOTE — Telephone Encounter (Signed)
Pt called requesting a refill on clopidogrel. I explained to the pt and his wife that pt does not supposed to be taking clopidogrel. Pt stated that Dr. Irish Lack told him to continue this medication. Please clarify if pt is still suppose to be taking this medication. Thanks

## 2021-02-18 MED ORDER — CLOPIDOGREL BISULFATE 75 MG PO TABS: 75.0000 mg | ORAL_TABLET | Freq: Every day | ORAL | 3 refills | Status: DC

## 2021-02-18 NOTE — Addendum Note (Signed)
Addended by: [name] on: 02/18/2021 01:07 PM   Modules accepted: Orders

## 2021-02-18 NOTE — Telephone Encounter (Signed)
Dr Irish Lack reviewed chart and it is OK for patient to resume Clopidogrel 75 mg daily as long as he is not having any bleeding issues. Continue ASA also. Chart reviewed and there is a HIPPA form in chart allowing [facility] to speak with patient's wife. I spoke with patient's wife who reports patient has been taking Clopidogrel and ASA 81 mg daily.  He is not having any bleeding issues.  I confirmed with patient's wife that patient is not taking Brilinta.  I gave patient's wife the instructions for patient to resume Clopidogrel and continue ASA. [facility]. Wife will have patient fill out [facility] DPR when he is in office the next time.
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
## 2021-05-29 ENCOUNTER — Ambulatory Visit (INDEPENDENT_AMBULATORY_CARE_PROVIDER_SITE_OTHER): Payer: No Typology Code available for payment source | Admitting: Interventional Cardiology

## 2021-05-29 ENCOUNTER — Encounter: Payer: Self-pay | Admitting: *Deleted

## 2021-05-29 ENCOUNTER — Encounter: Payer: Self-pay | Admitting: Interventional Cardiology

## 2021-05-29 ENCOUNTER — Other Ambulatory Visit: Payer: Self-pay

## 2021-05-29 VITALS — BP 134/80 | HR 74 | Ht 71.0 in | Wt 185.0 lb

## 2021-05-29 DIAGNOSIS — I2510 Atherosclerotic heart disease of native coronary artery without angina pectoris: Secondary | ICD-10-CM | POA: Diagnosis not present

## 2021-05-29 DIAGNOSIS — E782 Mixed hyperlipidemia: Secondary | ICD-10-CM | POA: Diagnosis not present

## 2021-05-29 DIAGNOSIS — R739 Hyperglycemia, unspecified: Secondary | ICD-10-CM

## 2021-05-29 DIAGNOSIS — Z951 Presence of aortocoronary bypass graft: Secondary | ICD-10-CM | POA: Diagnosis not present

## 2021-05-29 DIAGNOSIS — I10 Essential (primary) hypertension: Secondary | ICD-10-CM | POA: Diagnosis not present

## 2021-05-29 DIAGNOSIS — Z720 Tobacco use: Secondary | ICD-10-CM | POA: Diagnosis not present

## 2021-05-29 LAB — LIPID PANEL
Chol/HDL Ratio: 3 ratio (ref 0.0–5.0)
Cholesterol, Total: 101 mg/dL (ref 100–199)
HDL: 34 mg/dL — ABNORMAL LOW (ref >39)
LDL Chol Calc (NIH): 51 mg/dL (ref 0–99)
Triglycerides: 78 mg/dL (ref 0–149)
VLDL Cholesterol Cal: 16 mg/dL (ref 5–40)

## 2021-05-29 LAB — CBC
Hematocrit: 41 % (ref 37.5–51.0)
Hemoglobin: 13.2 g/dL (ref 13.0–17.7)
MCH: 26.7 pg (ref 26.6–33.0)
MCHC: 32.2 g/dL (ref 31.5–35.7)
MCV: 83 fL (ref 79–97)
Platelets: 219 10*3/uL (ref 150–450)
RBC: 4.95 x10E6/uL (ref 4.14–5.80)
RDW: 14.2 % (ref 11.6–15.4)
WBC: 6.1 10*3/uL (ref 3.4–10.8)

## 2021-05-29 LAB — COMPREHENSIVE METABOLIC PANEL
ALT: 11 IU/L (ref 0–44)
AST: 14 IU/L (ref 0–40)
Albumin/Globulin Ratio: 2.5 — ABNORMAL HIGH (ref 1.2–2.2)
Albumin: 4.5 g/dL (ref 3.8–4.8)
Alkaline Phosphatase: 107 IU/L (ref 44–121)
BUN/Creatinine Ratio: 13 (ref 10–24)
BUN: 11 mg/dL (ref 8–27)
Bilirubin Total: 0.4 mg/dL (ref 0.0–1.2)
CO2: 25 mmol/L (ref 20–29)
Calcium: 9.1 mg/dL (ref 8.6–10.2)
Chloride: 105 mmol/L (ref 96–106)
Creatinine, Ser: 0.82 mg/dL (ref 0.76–1.27)
Globulin, Total: 1.8 g/dL (ref 1.5–4.5)
Glucose: 88 mg/dL (ref 70–99)
Potassium: 4.3 mmol/L (ref 3.5–5.2)
Sodium: 141 mmol/L (ref 134–144)
Total Protein: 6.3 g/dL (ref 6.0–8.5)
eGFR: 94 mL/min/{1.73_m2} (ref >59)

## 2021-05-29 LAB — HEMOGLOBIN A1C
Est. average glucose Bld gHb Est-mCnc: 114 mg/dL
Hgb A1c MFr Bld: 5.6 % (ref 4.8–5.6)

## 2021-05-29 NOTE — Patient Instructions (Signed)
Medication Instructions:  Your physician recommends that you continue on your current medications as directed. Please refer to the Current Medication list given to you today.  *If you need a refill on your cardiac medications before your next appointment, please call your pharmacy*   Lab Work: Lab work to be done today--CBC, CMET, Lipids, A1C If you have labs (blood work) drawn today and your tests are completely normal, you will receive your results only by: [place] (if you have MyChart) OR A paper copy in the mail If you have any lab test that is abnormal or we need to change your treatment, we will call you to review the results.   Testing/Procedures: Your physician has requested that you have a lexiscan myoview. For further information please visit [email or web address]. Please follow instruction sheet, as given.    Follow-Up: At [facility], you and your health needs are our priority.  As part of our continuing mission to provide you with exceptional heart care, we have created designated Provider Care Teams.  These Care Teams include your primary Cardiologist (physician) and Advanced Practice Providers (APPs -  Physician Assistants and Nurse Practitioners) who all work together to provide you with the care you need, when you need it.  We recommend signing up for the patient portal called "MyChart".  Sign up information is provided on this After Visit Summary.  MyChart is used to connect with patients for Virtual Visits (Telemedicine).  Patients are able to view lab/test results, encounter notes, upcoming appointments, etc.  Non-urgent messages can be sent to your provider as well.   To learn more about what you can do with MyChart, go to [email or web address].    Your next appointment:   6 month(s)  The format for your next appointment:   In Person  Provider:   Larae Grooms, MD     Other Instructions

## 2021-06-02 ENCOUNTER — Other Ambulatory Visit: Payer: Self-pay | Admitting: *Deleted

## 2021-06-02 DIAGNOSIS — I25118 Atherosclerotic heart disease of native coronary artery with other forms of angina pectoris: Secondary | ICD-10-CM

## 2021-06-04 ENCOUNTER — Other Ambulatory Visit: Payer: Self-pay | Admitting: Interventional Cardiology

## 2021-06-04 DIAGNOSIS — E785 Hyperlipidemia, unspecified: Secondary | ICD-10-CM

## 2021-06-05 ENCOUNTER — Telehealth: Payer: Self-pay | Admitting: Interventional Cardiology

## 2021-06-05 ENCOUNTER — Encounter (HOSPITAL_COMMUNITY): Payer: Self-pay | Admitting: *Deleted

## 2021-06-05 ENCOUNTER — Telehealth (HOSPITAL_COMMUNITY): Payer: Self-pay | Admitting: *Deleted

## 2021-06-05 NOTE — Telephone Encounter (Addendum)
Lm to call back ./cy Have not received FMLA forms and wife needs to be aware of the $29.00 fee may pay by cash, check ,or money  order for filling out forms ./cy

## 2021-06-05 NOTE — Telephone Encounter (Signed)
Patient's wife states her employer sent [place] FMLA paperwork on 01/25 and she would like to know if it has been received. She states she has been assisting her husband with all of his appointment and plans to assist him post-op as well. Please advise.

## 2021-06-05 NOTE — Telephone Encounter (Signed)
My chart and snail mail letters sent outlining instructions for upcoming stress test on 06/12/21.

## 2021-06-12 ENCOUNTER — Ambulatory Visit (HOSPITAL_COMMUNITY): Payer: No Typology Code available for payment source | Attending: Interventional Cardiology

## 2021-06-12 ENCOUNTER — Other Ambulatory Visit: Payer: Self-pay

## 2021-06-12 DIAGNOSIS — I2510 Atherosclerotic heart disease of native coronary artery without angina pectoris: Secondary | ICD-10-CM | POA: Diagnosis not present

## 2021-06-12 DIAGNOSIS — Z0279 Encounter for issue of other medical certificate: Secondary | ICD-10-CM

## 2021-06-12 DIAGNOSIS — Z951 Presence of aortocoronary bypass graft: Secondary | ICD-10-CM | POA: Diagnosis not present

## 2021-06-12 LAB — MYOCARDIAL PERFUSION IMAGING
LV dias vol: 96 mL (ref 62–150)
LV sys vol: 51 mL
Nuc Stress EF: 47 %
Peak HR: 88 {beats}/min
Rest HR: 60 {beats}/min
Rest Nuclear Isotope Dose: 10.6 mCi
SDS: 0
SRS: 6
SSS: 6
ST Depression (mm): 0 mm
Stress Nuclear Isotope Dose: 32.9 mCi
TID: 0.9

## 2021-06-12 MED ORDER — REGADENOSON 0.4 MG/5ML IV SOLN
0.4000 mg | Freq: Once | INTRAVENOUS | Status: AC
  Administered 2021-06-12: 0.4 mg via INTRAVENOUS

## 2021-06-12 MED ORDER — TECHNETIUM TC 99M TETROFOSMIN IV KIT
10.6000 | PACK | Freq: Once | INTRAVENOUS | Status: AC | PRN
  Administered 2021-06-12: 10.6 via INTRAVENOUS
  Filled 2021-06-12: qty 11

## 2021-06-12 MED ORDER — TECHNETIUM TC 99M TETROFOSMIN IV KIT
32.9000 | PACK | Freq: Once | INTRAVENOUS | Status: AC | PRN
  Administered 2021-06-12: 32.9 via INTRAVENOUS
  Filled 2021-06-12: qty 33

## 2021-06-15 ENCOUNTER — Telehealth: Payer: Self-pay | Admitting: Interventional Cardiology

## 2021-06-15 NOTE — Telephone Encounter (Signed)
Forms from [name] spouse received on 06/12/2021. Completed patient auth attached. Took form to Dr. Irish Lack box for completion. 06/15/2021 JMM

## 2021-06-23 ENCOUNTER — Telehealth: Payer: Self-pay | Admitting: Interventional Cardiology

## 2021-06-23 NOTE — Telephone Encounter (Signed)
Patient called for his test results.    Patient's wife has FMLA that she needed to be completed.  She did bring them into the office.  She would like those forms to be faxed, the form has the fax number.

## 2021-06-23 NOTE — Telephone Encounter (Signed)
Pt aware of myoview results and spoke with wife and FMLA forms will be faxed once completed ./cy     No new ischemia. Evidence of known prior MI. Continue current meds.

## 2021-06-29 ENCOUNTER — Telehealth: Payer: Self-pay | Admitting: Interventional Cardiology

## 2021-06-29 NOTE — Telephone Encounter (Signed)
Form completed by Dr. Irish Lack and faxed to [place] on 06/29/2021. Called patient to let him know paper work was ready and the voicemail was full.

## 2021-07-06 ENCOUNTER — Ambulatory Visit: Payer: No Typology Code available for payment source | Admitting: Cardiothoracic Surgery

## 2021-07-06 ENCOUNTER — Encounter: Payer: Self-pay | Admitting: Cardiothoracic Surgery

## 2021-07-06 ENCOUNTER — Other Ambulatory Visit: Payer: Self-pay

## 2021-07-06 ENCOUNTER — Ambulatory Visit (INDEPENDENT_AMBULATORY_CARE_PROVIDER_SITE_OTHER): Payer: No Typology Code available for payment source | Admitting: Cardiothoracic Surgery

## 2021-07-06 VITALS — BP 139/80 | HR 64 | Resp 20 | Ht 71.0 in | Wt 189.0 lb

## 2021-07-06 DIAGNOSIS — T84218D Breakdown (mechanical) of internal fixation device of other bones, subsequent encounter: Secondary | ICD-10-CM

## 2021-07-06 DIAGNOSIS — K439 Ventral hernia without obstruction or gangrene: Secondary | ICD-10-CM | POA: Diagnosis not present

## 2021-07-06 DIAGNOSIS — Z951 Presence of aortocoronary bypass graft: Secondary | ICD-10-CM

## 2021-07-06 NOTE — Progress Notes (Signed)
Patient ID: Daniel [name], male   DOB: May 04, 1951, 71 y.o.   MRN: [id number]  The patient returns for wound check after having sternal plating and rewiring approximately a year ago by Dr. Orvan Seen for sternal dehiscence after CABG x3 in early 2022.  Patient is doing well and has no complaints about his sternal incision.  He does have concern over a small 2 finger ventral hernia just below the xiphoid process.  This is not tender and is not bothering the patient.  Blood pressure 139/80, pulse 64, resp. rate 20, height 5\' 11"  (1.803 m), weight 189 lb (85.7 kg), SpO2 96 %.   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 Minimal risk for incarceration or obstruction or symptoms. Best policy is to follow the ventral hernia for progression in size or symptoms. Patient will return in 1 year for wound check.  He will let us know if anything changes regarding symptoms.

## 2021-08-02 ENCOUNTER — Other Ambulatory Visit: Payer: Self-pay | Admitting: Interventional Cardiology

## 2021-09-22 ENCOUNTER — Other Ambulatory Visit: Payer: Self-pay | Admitting: Interventional Cardiology

## 2021-10-11 IMAGING — DX DG CHEST 2V
2 series · 2 of 2 positions shown · non-contrast
Comparison: 07/27/2014

CLINICAL DATA: Preoperative assessment for CABG

EXAM:
CHEST - 2 VIEW

[chest pa]
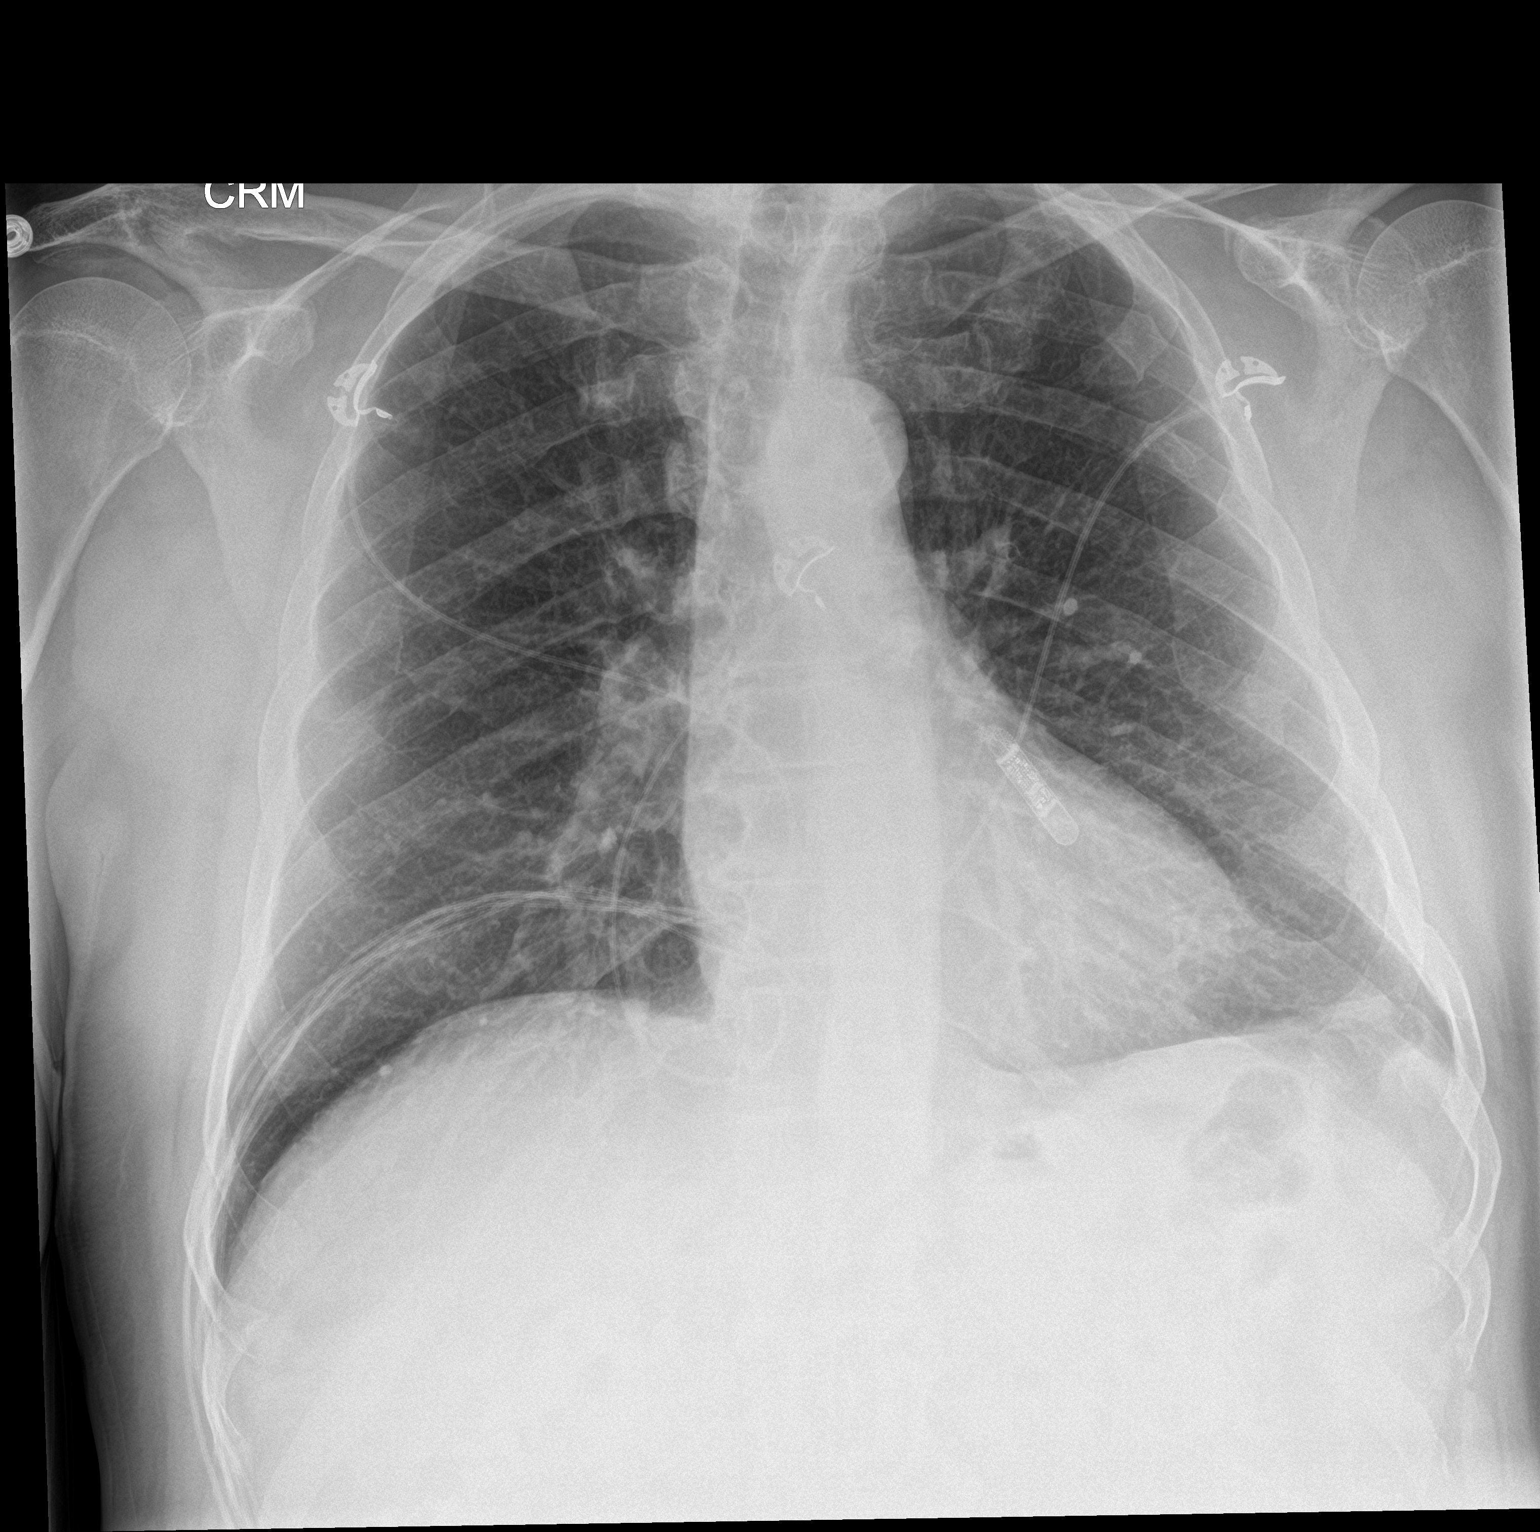

[chest lat]
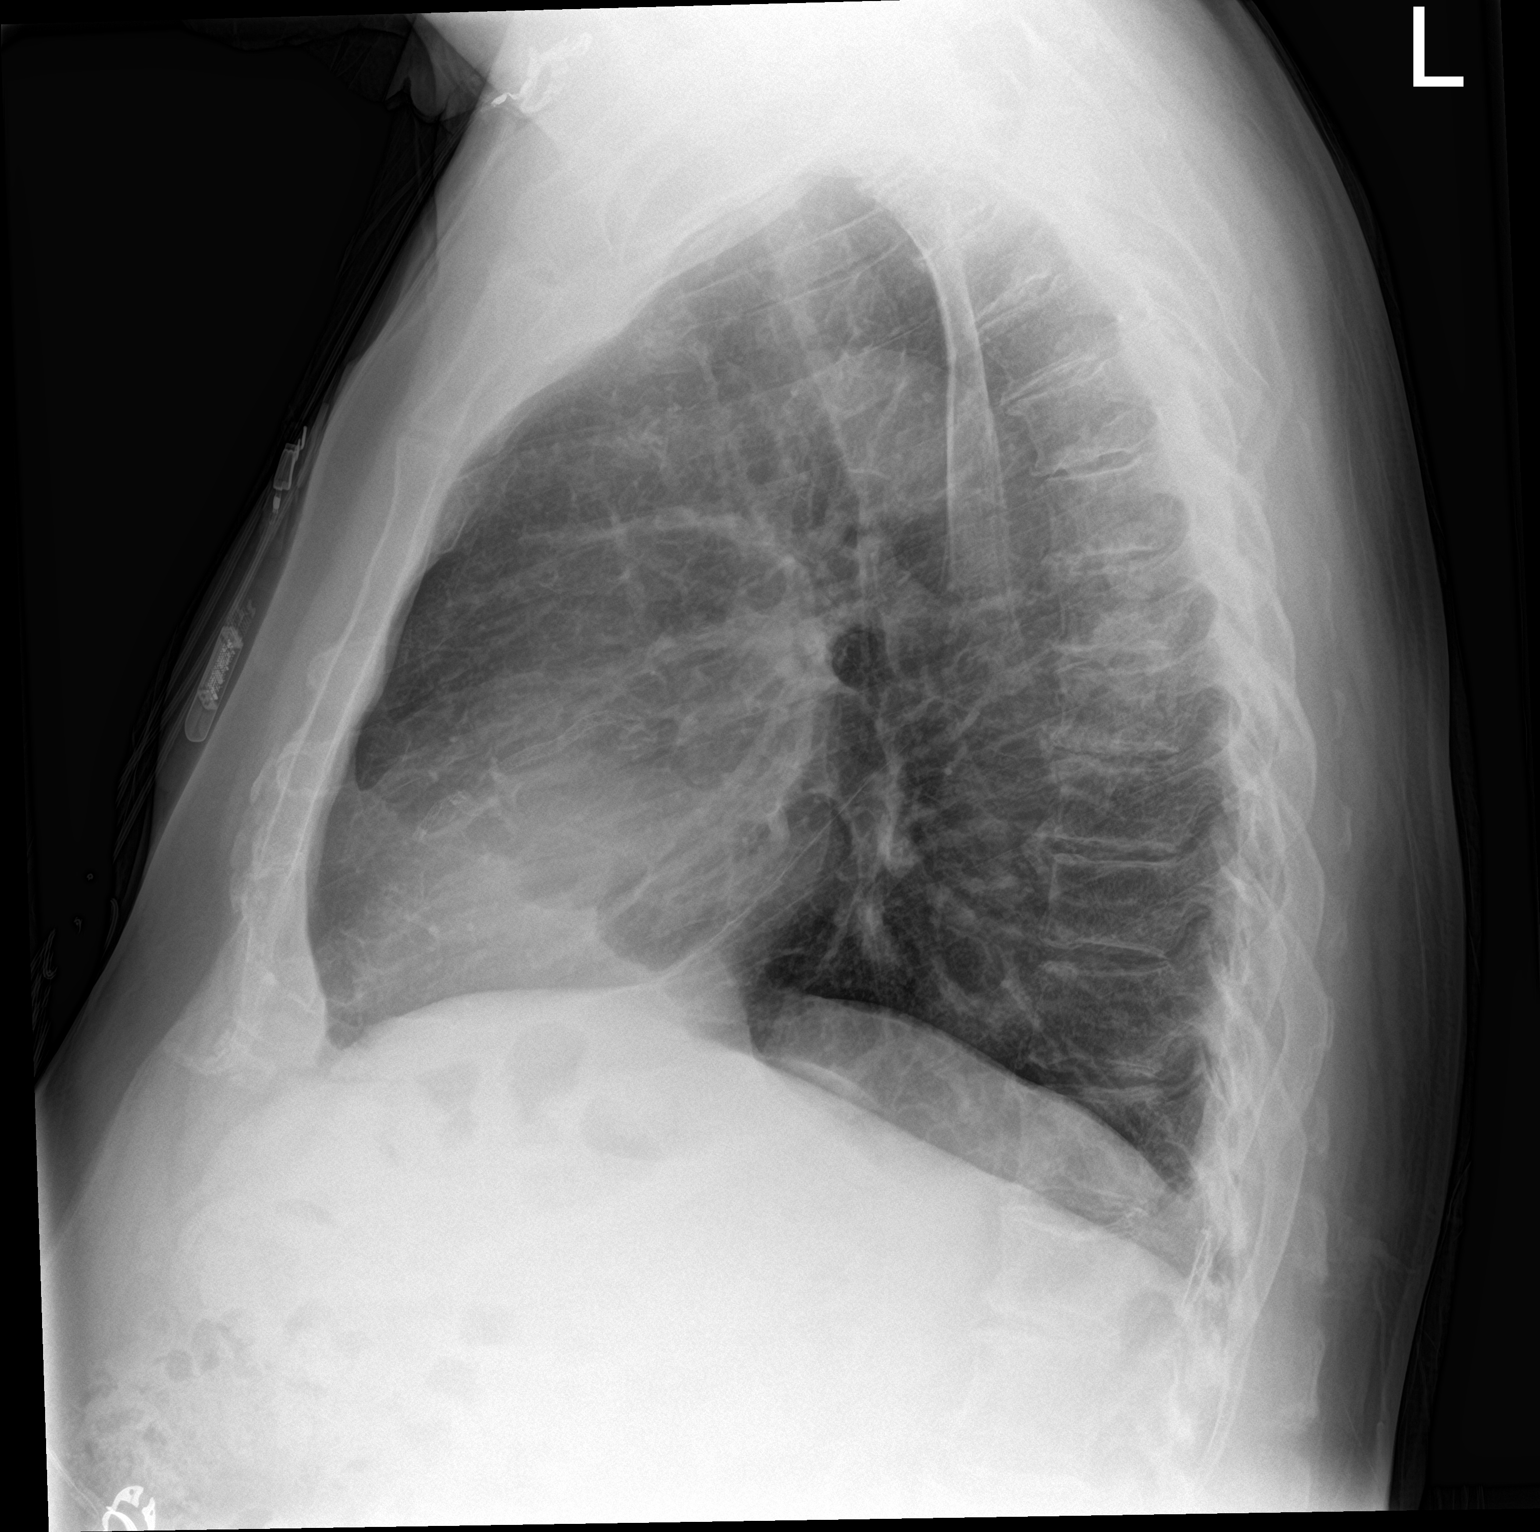

[2 of 2 positions shown; findings below may reference images not displayed]

FINDINGS: Frontal and lateral views of the chest demonstrated loop recorder
within the left anterior chest. Cardiac silhouette is unremarkable.
No airspace disease, effusion, or pneumothorax. Multiple prior
healed left rib fractures. No acute bony abnormalities.
IMPRESSION: 1. No acute intrathoracic process.

## 2021-10-30 IMAGING — DX DG CHEST 2V
2 series · 2 of 2 positions shown · non-contrast
Comparison: February 05, 2020

CLINICAL DATA: Status post coronary artery bypass grafting

EXAM:
CHEST - 2 VIEW

[dg chest 2 view (1 of 2)]
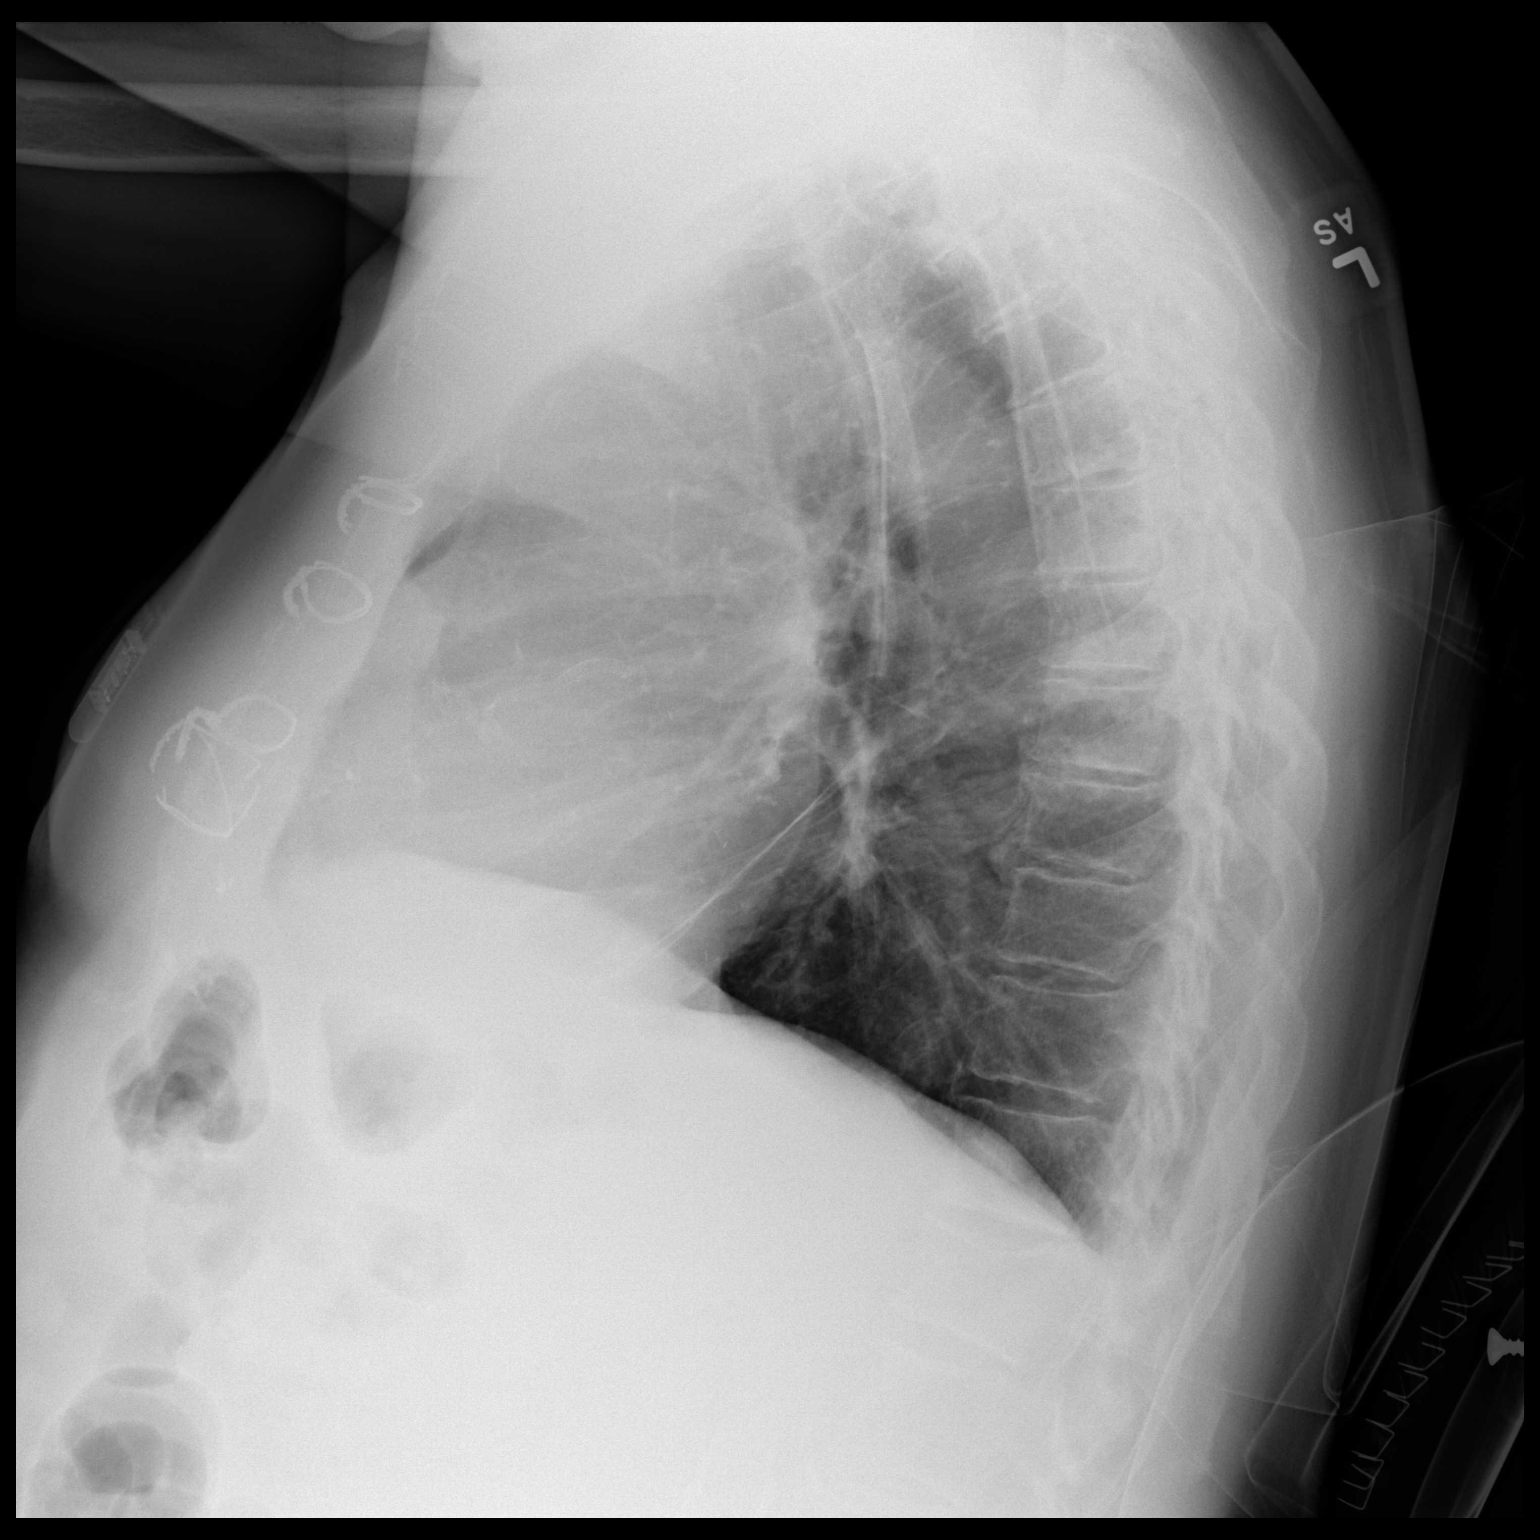

[dg chest 2 view (2 of 2)]
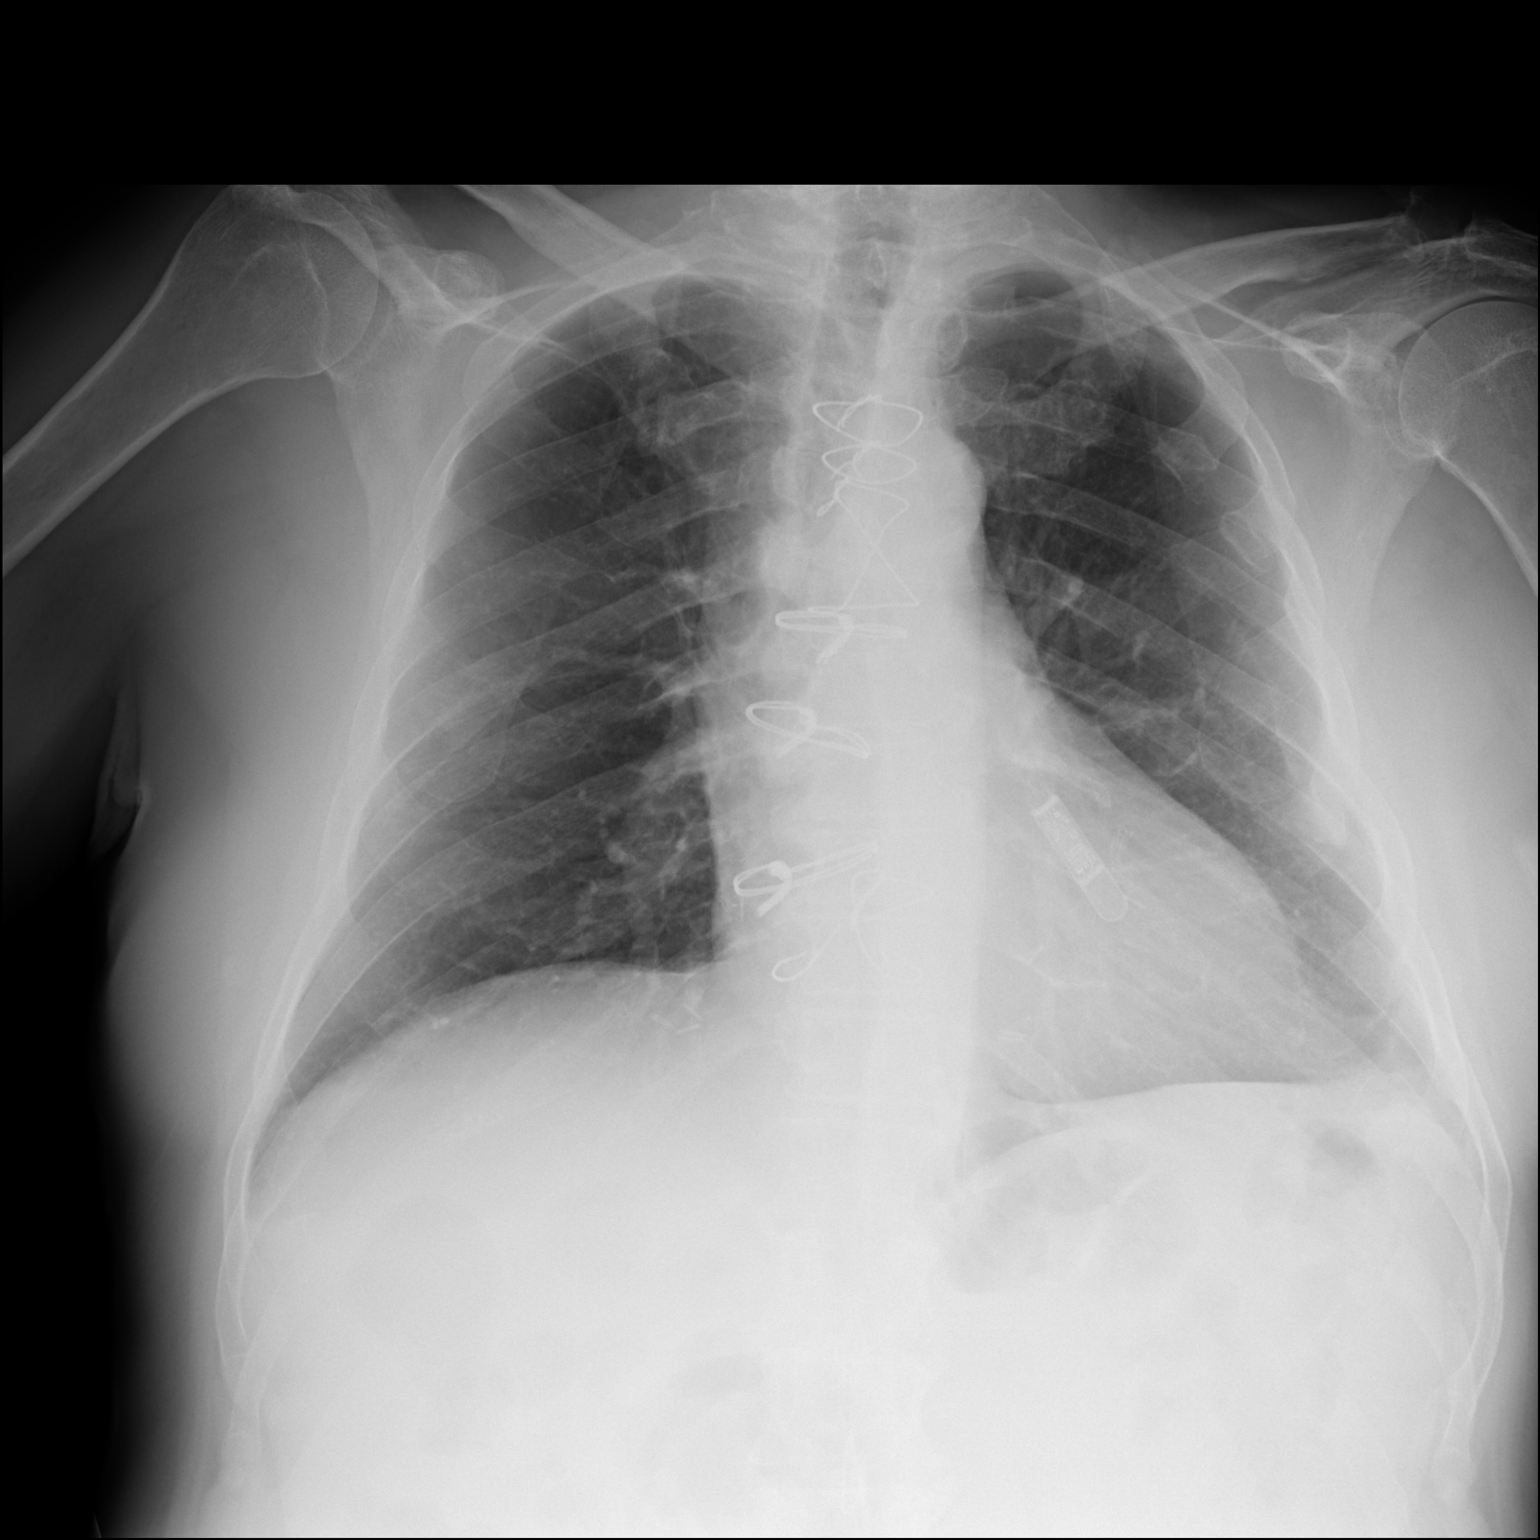

[2 of 2 positions shown; findings below may reference images not displayed]

FINDINGS: There is slight left base atelectasis. The lungs elsewhere are
clear. Heart size and pulmonary vascularity are normal. No
adenopathy. Patient is status post median sternotomy. Loop recorder
present on the left anteriorly. There is calcification in the left
carotid artery. No bone lesions.
IMPRESSION: Slight left base atelectasis. No edema or airspace opacity. Stable
cardiac silhouette. Loop recorder on the left anteriorly.

Calcification noted in left carotid artery.

## 2021-11-20 DIAGNOSIS — M25551 Pain in right hip: Secondary | ICD-10-CM | POA: Diagnosis not present

## 2021-11-20 DIAGNOSIS — M533 Sacrococcygeal disorders, not elsewhere classified: Secondary | ICD-10-CM | POA: Diagnosis not present

## 2021-11-25 DIAGNOSIS — M61552 Other ossification of muscle, left thigh: Secondary | ICD-10-CM | POA: Diagnosis not present

## 2021-11-25 DIAGNOSIS — M778 Other enthesopathies, not elsewhere classified: Secondary | ICD-10-CM | POA: Diagnosis not present

## 2021-11-25 DIAGNOSIS — R799 Abnormal finding of blood chemistry, unspecified: Secondary | ICD-10-CM | POA: Diagnosis not present

## 2021-11-25 DIAGNOSIS — R936 Abnormal findings on diagnostic imaging of limbs: Secondary | ICD-10-CM | POA: Diagnosis not present

## 2021-11-25 DIAGNOSIS — Z96642 Presence of left artificial hip joint: Secondary | ICD-10-CM | POA: Diagnosis not present

## 2021-11-25 DIAGNOSIS — Z96649 Presence of unspecified artificial hip joint: Secondary | ICD-10-CM | POA: Diagnosis not present

## 2021-11-25 DIAGNOSIS — M1611 Unilateral primary osteoarthritis, right hip: Secondary | ICD-10-CM | POA: Diagnosis not present

## 2021-11-25 DIAGNOSIS — M461 Sacroiliitis, not elsewhere classified: Secondary | ICD-10-CM | POA: Diagnosis not present

## 2021-11-25 DIAGNOSIS — M899 Disorder of bone, unspecified: Secondary | ICD-10-CM | POA: Diagnosis not present

## 2021-11-25 DIAGNOSIS — Z471 Aftercare following joint replacement surgery: Secondary | ICD-10-CM | POA: Diagnosis not present

## 2021-12-07 DIAGNOSIS — F324 Major depressive disorder, single episode, in partial remission: Secondary | ICD-10-CM | POA: Diagnosis not present

## 2021-12-07 DIAGNOSIS — R351 Nocturia: Secondary | ICD-10-CM | POA: Diagnosis not present

## 2022-03-13 ENCOUNTER — Other Ambulatory Visit: Payer: Self-pay | Admitting: Interventional Cardiology

## 2022-03-13 DIAGNOSIS — E785 Hyperlipidemia, unspecified: Secondary | ICD-10-CM

## 2022-04-15 ENCOUNTER — Other Ambulatory Visit: Payer: Self-pay | Admitting: Interventional Cardiology

## 2022-04-15 DIAGNOSIS — E785 Hyperlipidemia, unspecified: Secondary | ICD-10-CM

## 2022-04-15 NOTE — Telephone Encounter (Signed)
Rx refill sent to pharmacy. 

## 2022-04-29 ENCOUNTER — Other Ambulatory Visit: Payer: Self-pay | Admitting: Interventional Cardiology

## 2022-04-29 DIAGNOSIS — E785 Hyperlipidemia, unspecified: Secondary | ICD-10-CM

## 2022-05-31 DIAGNOSIS — Z6828 Body mass index (BMI) 28.0-28.9, adult: Secondary | ICD-10-CM | POA: Diagnosis not present

## 2022-05-31 DIAGNOSIS — F324 Major depressive disorder, single episode, in partial remission: Secondary | ICD-10-CM | POA: Diagnosis not present

## 2022-05-31 DIAGNOSIS — R5381 Other malaise: Secondary | ICD-10-CM | POA: Diagnosis not present

## 2022-05-31 LAB — BASIC METABOLIC PANEL
BUN: 17 (ref 4–21)
CO2: 29 — AB (ref 13–22)
Chloride: 107 (ref 99–108)
Creatinine: 0.9 (ref 0.6–1.3)
Glucose: 125
Potassium: 4.6 meq/L (ref 3.5–5.1)
Sodium: 142 (ref 137–147)

## 2022-05-31 LAB — COMPREHENSIVE METABOLIC PANEL
Albumin: 4.3 (ref 3.5–5.0)
Calcium: 9.2 (ref 8.7–10.7)
eGFR: 92

## 2022-05-31 LAB — CBC AND DIFFERENTIAL
HCT: 38 — AB (ref 41–53)
Hemoglobin: 12.4 — AB (ref 13.5–17.5)
Platelets: 199 10*3/uL (ref 150–400)
WBC: 5.7

## 2022-05-31 LAB — HEPATIC FUNCTION PANEL
ALT: 19 U/L (ref 10–40)
AST: 20 (ref 14–40)
Alkaline Phosphatase: 92 (ref 25–125)
Bilirubin, Total: 0.4

## 2022-05-31 LAB — CBC: RBC: 4.67 (ref 3.87–5.11)

## 2022-05-31 LAB — VITAMIN B12: Vitamin B-12: 216

## 2022-06-08 ENCOUNTER — Other Ambulatory Visit: Payer: Self-pay | Admitting: Interventional Cardiology

## 2022-06-08 DIAGNOSIS — E785 Hyperlipidemia, unspecified: Secondary | ICD-10-CM
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
## 2022-06-09 ENCOUNTER — Ambulatory Visit: Payer: No Typology Code available for payment source | Attending: Nurse Practitioner | Admitting: Nurse Practitioner

## 2022-06-09 ENCOUNTER — Encounter: Payer: Self-pay | Admitting: Nurse Practitioner

## 2022-06-09 VITALS — BP 118/62 | HR 74 | Ht 71.0 in | Wt 195.4 lb

## 2022-06-09 DIAGNOSIS — Z720 Tobacco use: Secondary | ICD-10-CM | POA: Diagnosis not present

## 2022-06-09 DIAGNOSIS — E782 Mixed hyperlipidemia: Secondary | ICD-10-CM

## 2022-06-09 DIAGNOSIS — I25118 Atherosclerotic heart disease of native coronary artery with other forms of angina pectoris: Secondary | ICD-10-CM

## 2022-06-09 DIAGNOSIS — I10 Essential (primary) hypertension: Secondary | ICD-10-CM

## 2022-06-09 DIAGNOSIS — Z01810 Encounter for preprocedural cardiovascular examination: Secondary | ICD-10-CM

## 2022-06-09 MED ORDER — METOPROLOL SUCCINATE ER 25 MG PO TB24: 25.0000 mg | ORAL_TABLET | Freq: Every day | ORAL | 6 refills | Status: DC

## 2022-06-09 NOTE — Patient Instructions (Addendum)
Medication Instructions:  START Metoprolol Succinate '25mg'$  Take 1 tablet once day  Slo-Fe Iron Brand HOLD Plavix for 5 days prior to procedure HOLD Aspirin 7 days prior to procedure you may start both once your doctor advises you too *If you need a refill on your cardiac medications before your next appointment, please call your pharmacy*   Lab Work: None Ordered  Testing/Procedures: None ordered  Follow-Up: At [facility], you and your health needs are our priority.  As part of our continuing mission to provide you with exceptional heart care, we have created designated Provider Care Teams.  These Care Teams include your primary Cardiologist (physician) and Advanced Practice Providers (APPs -  Physician Assistants and Nurse Practitioners) who all work together to provide you with the care you need, when you need it.  We recommend signing up for the patient portal called "MyChart".  Sign up information is provided on this After Visit Summary.  MyChart is used to connect with patients for Virtual Visits (Telemedicine).  Patients are able to view lab/test results, encounter notes, upcoming appointments, etc.  Non-urgent messages can be sent to your provider as well.   To learn more about what you can do with MyChart, go to [email or web address].    Your next appointment:   6 month(s)  Provider:   Larae Grooms, MD     Other Instructions

## 2022-06-10 ENCOUNTER — Encounter: Payer: Self-pay | Admitting: Nurse Practitioner

## 2022-06-12 ENCOUNTER — Other Ambulatory Visit: Payer: Self-pay | Admitting: Interventional Cardiology

## 2022-06-12 DIAGNOSIS — E785 Hyperlipidemia, unspecified: Secondary | ICD-10-CM

## 2022-06-16 DIAGNOSIS — M1611 Unilateral primary osteoarthritis, right hip: Secondary | ICD-10-CM | POA: Diagnosis not present

## 2022-06-17 DIAGNOSIS — M1611 Unilateral primary osteoarthritis, right hip: Secondary | ICD-10-CM | POA: Diagnosis not present

## 2022-06-18 ENCOUNTER — Other Ambulatory Visit: Payer: Self-pay | Admitting: Interventional Cardiology

## 2022-06-28 DIAGNOSIS — I82409 Acute embolism and thrombosis of unspecified deep veins of unspecified lower extremity: Secondary | ICD-10-CM | POA: Diagnosis not present

## 2022-06-28 DIAGNOSIS — Z96641 Presence of right artificial hip joint: Secondary | ICD-10-CM | POA: Diagnosis not present

## 2022-06-28 DIAGNOSIS — I4891 Unspecified atrial fibrillation: Secondary | ICD-10-CM | POA: Diagnosis not present

## 2022-06-28 DIAGNOSIS — J449 Chronic obstructive pulmonary disease, unspecified: Secondary | ICD-10-CM | POA: Diagnosis not present

## 2022-06-28 DIAGNOSIS — E785 Hyperlipidemia, unspecified: Secondary | ICD-10-CM | POA: Diagnosis not present

## 2022-06-28 DIAGNOSIS — G8918 Other acute postprocedural pain: Secondary | ICD-10-CM | POA: Diagnosis not present

## 2022-06-28 DIAGNOSIS — Z951 Presence of aortocoronary bypass graft: Secondary | ICD-10-CM | POA: Diagnosis not present

## 2022-06-28 DIAGNOSIS — M1611 Unilateral primary osteoarthritis, right hip: Secondary | ICD-10-CM | POA: Diagnosis not present

## 2022-06-28 DIAGNOSIS — I2510 Atherosclerotic heart disease of native coronary artery without angina pectoris: Secondary | ICD-10-CM | POA: Diagnosis not present

## 2022-06-28 DIAGNOSIS — I10 Essential (primary) hypertension: Secondary | ICD-10-CM | POA: Diagnosis not present

## 2022-06-29 DIAGNOSIS — Z951 Presence of aortocoronary bypass graft: Secondary | ICD-10-CM | POA: Diagnosis not present

## 2022-06-29 DIAGNOSIS — J449 Chronic obstructive pulmonary disease, unspecified: Secondary | ICD-10-CM | POA: Diagnosis not present

## 2022-06-29 DIAGNOSIS — Z96641 Presence of right artificial hip joint: Secondary | ICD-10-CM | POA: Diagnosis not present

## 2022-06-29 DIAGNOSIS — M1611 Unilateral primary osteoarthritis, right hip: Secondary | ICD-10-CM | POA: Diagnosis not present

## 2022-06-29 DIAGNOSIS — I82409 Acute embolism and thrombosis of unspecified deep veins of unspecified lower extremity: Secondary | ICD-10-CM | POA: Diagnosis not present

## 2022-06-29 DIAGNOSIS — D62 Acute posthemorrhagic anemia: Secondary | ICD-10-CM | POA: Diagnosis not present

## 2022-06-29 DIAGNOSIS — Z8673 Personal history of transient ischemic attack (TIA), and cerebral infarction without residual deficits: Secondary | ICD-10-CM | POA: Diagnosis not present

## 2022-06-29 DIAGNOSIS — I10 Essential (primary) hypertension: Secondary | ICD-10-CM | POA: Diagnosis not present

## 2022-06-29 DIAGNOSIS — D509 Iron deficiency anemia, unspecified: Secondary | ICD-10-CM | POA: Diagnosis not present

## 2022-06-29 DIAGNOSIS — T85848A Pain due to other internal prosthetic devices, implants and grafts, initial encounter: Secondary | ICD-10-CM | POA: Diagnosis not present

## 2022-06-29 DIAGNOSIS — I4891 Unspecified atrial fibrillation: Secondary | ICD-10-CM | POA: Diagnosis not present

## 2022-06-29 DIAGNOSIS — I2510 Atherosclerotic heart disease of native coronary artery without angina pectoris: Secondary | ICD-10-CM | POA: Diagnosis not present

## 2022-06-29 DIAGNOSIS — E785 Hyperlipidemia, unspecified: Secondary | ICD-10-CM | POA: Diagnosis not present

## 2022-06-29 DIAGNOSIS — F39 Unspecified mood [affective] disorder: Secondary | ICD-10-CM | POA: Diagnosis not present

## 2022-07-12 ENCOUNTER — Ambulatory Visit: Payer: No Typology Code available for payment source | Admitting: Cardiothoracic Surgery

## 2022-07-12 ENCOUNTER — Encounter: Payer: Self-pay | Admitting: Cardiothoracic Surgery

## 2022-07-14 DIAGNOSIS — D649 Anemia, unspecified: Secondary | ICD-10-CM | POA: Diagnosis not present

## 2022-07-14 LAB — CBC AND DIFFERENTIAL
HCT: 30 — AB (ref 41–53)
Hemoglobin: 9.8 — AB (ref 13.5–17.5)
Platelets: 473 10*3/uL — AB (ref 150–400)
WBC: 4.5

## 2022-07-14 LAB — TSH: TSH: 1.01 (ref 0.41–5.90)

## 2022-07-14 LAB — IRON,TIBC AND FERRITIN PANEL
%SAT: 4
Iron: 16

## 2022-07-14 LAB — CBC: RBC: 3.81 — AB (ref 3.87–5.11)

## 2022-07-23 DIAGNOSIS — R6889 Other general symptoms and signs: Secondary | ICD-10-CM | POA: Diagnosis not present

## 2022-08-16 LAB — CBC AND DIFFERENTIAL
HCT: 39 — AB (ref 41–53)
Hemoglobin: 12.2 — AB (ref 13.5–17.5)
Neutrophils Absolute: 3.4
Platelets: 212 10*3/uL (ref 150–400)
WBC: 5.6

## 2022-08-16 LAB — IRON,TIBC AND FERRITIN PANEL
%SAT: 34
Iron: 145

## 2022-08-16 LAB — TESTOSTERONE
EGFR (Non-African Amer.): 92
TSH: 0.55 (ref 0.41–5.90)
Testosterone: 290.5

## 2022-08-16 LAB — TSH: TSH: 0.55 (ref 0.41–5.90)

## 2022-08-16 LAB — CBC: RBC: 4.87 (ref 3.87–5.11)

## 2022-08-16 LAB — VITAMIN B12: Vitamin B-12: 253

## 2022-08-24 ENCOUNTER — Encounter: Payer: Self-pay | Admitting: Interventional Cardiology

## 2022-08-24 ENCOUNTER — Ambulatory Visit
Payer: No Typology Code available for payment source | Attending: Interventional Cardiology | Admitting: Interventional Cardiology

## 2022-08-24 VITALS — BP 122/72 | HR 69 | Ht 71.0 in | Wt 182.6 lb

## 2022-08-24 DIAGNOSIS — I10 Essential (primary) hypertension: Secondary | ICD-10-CM

## 2022-08-24 DIAGNOSIS — E782 Mixed hyperlipidemia: Secondary | ICD-10-CM | POA: Diagnosis not present

## 2022-08-24 DIAGNOSIS — I25118 Atherosclerotic heart disease of native coronary artery with other forms of angina pectoris: Secondary | ICD-10-CM

## 2022-08-24 DIAGNOSIS — Z720 Tobacco use: Secondary | ICD-10-CM | POA: Diagnosis not present
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
## 2022-08-24 NOTE — Addendum Note (Signed)
Addended by: Lacy Duverney R on: 08/24/2022 05:03 PM   Modules accepted: Orders

## 2022-08-24 NOTE — Patient Instructions (Signed)
Medication Instructions:  Your physician recommends that you continue on your current medications as directed. Please refer to the Current Medication list given to you today.  *If you need a refill on your cardiac medications before your next appointment, please call your pharmacy*   Lab Work: none If you have labs (blood work) drawn today and your tests are completely normal, you will receive your results only by: MyChart Message (if you have MyChart) OR A paper copy in the mail If you have any lab test that is abnormal or we need to change your treatment, we will call you to review the results.   Testing/Procedures: none   Follow-Up: At [facility], you and your health needs are our priority.  As part of our continuing mission to provide you with exceptional heart care, we have created designated Provider Care Teams.  These Care Teams include your primary Cardiologist (physician) and Advanced Practice Providers (APPs -  Physician Assistants and Nurse Practitioners) who all work together to provide you with the care you need, when you need it.  We recommend signing up for the patient portal called "MyChart".  Sign up information is provided on this After Visit Summary.  MyChart is used to connect with patients for Virtual Visits (Telemedicine).  Patients are able to view lab/test results, encounter notes, upcoming appointments, etc.  Non-urgent messages can be sent to your provider as well.   To learn more about what you can do with MyChart, go to [email or web address].    Your next appointment:   August 16  Provider:   Lance Muss, MD     Other Instructions

## 2022-08-25 DIAGNOSIS — M8588 Other specified disorders of bone density and structure, other site: Secondary | ICD-10-CM | POA: Diagnosis not present

## 2022-08-25 DIAGNOSIS — Z471 Aftercare following joint replacement surgery: Secondary | ICD-10-CM | POA: Diagnosis not present

## 2022-08-25 DIAGNOSIS — Z96643 Presence of artificial hip joint, bilateral: Secondary | ICD-10-CM | POA: Diagnosis not present

## 2022-08-25 DIAGNOSIS — Z96641 Presence of right artificial hip joint: Secondary | ICD-10-CM | POA: Diagnosis not present

## 2022-12-18 ENCOUNTER — Other Ambulatory Visit: Payer: Self-pay | Admitting: Nurse Practitioner
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
## 2022-12-24 ENCOUNTER — Ambulatory Visit
Payer: No Typology Code available for payment source | Attending: Interventional Cardiology | Admitting: Interventional Cardiology

## 2022-12-24 ENCOUNTER — Encounter: Payer: Self-pay | Admitting: Interventional Cardiology

## 2022-12-24 VITALS — BP 118/76 | HR 62 | Ht 71.0 in | Wt 191.4 lb

## 2022-12-24 DIAGNOSIS — E782 Mixed hyperlipidemia: Secondary | ICD-10-CM

## 2022-12-24 DIAGNOSIS — Z951 Presence of aortocoronary bypass graft: Secondary | ICD-10-CM | POA: Diagnosis not present

## 2022-12-24 DIAGNOSIS — Z720 Tobacco use: Secondary | ICD-10-CM

## 2022-12-24 DIAGNOSIS — Z95818 Presence of other cardiac implants and grafts: Secondary | ICD-10-CM

## 2022-12-24 DIAGNOSIS — I10 Essential (primary) hypertension: Secondary | ICD-10-CM | POA: Diagnosis not present

## 2022-12-24 DIAGNOSIS — I25118 Atherosclerotic heart disease of native coronary artery with other forms of angina pectoris: Secondary | ICD-10-CM

## 2022-12-24 MED ORDER — NICOTINE 7 MG/24HR TD PT24: 7.0000 mg | MEDICATED_PATCH | Freq: Every day | TRANSDERMAL | 0 refills | Status: DC

## 2022-12-24 NOTE — Patient Instructions (Addendum)
Medication Instructions:  Your physician has recommended you make the following change in your medication: Start 7 mg nicotine patch.  Apply to skin daily   *If you need a refill on your cardiac medications before your next appointment, please call your pharmacy*   Lab Work: none If you have labs (blood work) drawn today and your tests are completely normal, you will receive your results only by: MyChart Message (if you have MyChart) OR A paper copy in the mail If you have any lab test that is abnormal or we need to change your treatment, we will call you to review the results.   Testing/Procedures: none   Follow-Up: At [facility], you and your health needs are our priority.  As part of our continuing mission to provide you with exceptional heart care, we have created designated Provider Care Teams.  These Care Teams include your primary Cardiologist (physician) and Advanced Practice Providers (APPs -  Physician Assistants and Nurse Practitioners) who all work together to provide you with the care you need, when you need it.  We recommend signing up for the patient portal called "MyChart".  Sign up information is provided on this After Visit Summary.  MyChart is used to connect with patients for Virtual Visits (Telemedicine).  Patients are able to view lab/test results, encounter notes, upcoming appointments, etc.  Non-urgent messages can be sent to your provider as well.   To learn more about what you can do with MyChart, go to [email or web address].    Your next appointment:   12 month(s)  Provider:   Dr Lynnette Caffey     Other Instructions   You have been referred to see Dr Nelly Laurence for removal of loop recorder

## 2023-01-17 ENCOUNTER — Telehealth: Payer: Self-pay | Admitting: Medical-Surgical

## 2023-01-17 NOTE — Telephone Encounter (Signed)
Patient's spouse called in wanting to change PCP to Christen Butter because his is retiring. Spouse is Wilman Scala (DOB: 08/21/1965).

## 2023-02-02 ENCOUNTER — Ambulatory Visit (INDEPENDENT_AMBULATORY_CARE_PROVIDER_SITE_OTHER): Payer: No Typology Code available for payment source | Admitting: Medical-Surgical

## 2023-02-02 ENCOUNTER — Encounter: Payer: Self-pay | Admitting: Medical-Surgical

## 2023-02-02 VITALS — BP 148/84 | HR 58 | Resp 20 | Ht 71.0 in | Wt 198.0 lb

## 2023-02-02 DIAGNOSIS — Z23 Encounter for immunization: Secondary | ICD-10-CM

## 2023-02-02 DIAGNOSIS — E785 Hyperlipidemia, unspecified: Secondary | ICD-10-CM

## 2023-02-02 DIAGNOSIS — F418 Other specified anxiety disorders: Secondary | ICD-10-CM

## 2023-02-02 DIAGNOSIS — I10 Essential (primary) hypertension: Secondary | ICD-10-CM

## 2023-02-02 DIAGNOSIS — F4312 Post-traumatic stress disorder, chronic: Secondary | ICD-10-CM

## 2023-02-02 DIAGNOSIS — Z7689 Persons encountering health services in other specified circumstances: Secondary | ICD-10-CM
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
## 2023-02-08 ENCOUNTER — Ambulatory Visit
Payer: No Typology Code available for payment source | Attending: Cardiovascular Disease | Admitting: Cardiovascular Disease

## 2023-02-08 ENCOUNTER — Encounter: Payer: Self-pay | Admitting: Cardiovascular Disease

## 2023-02-08 ENCOUNTER — Telehealth: Payer: Self-pay | Admitting: Medical-Surgical

## 2023-02-08 VITALS — BP 140/68 | HR 65 | Ht 71.0 in | Wt 195.8 lb

## 2023-02-08 DIAGNOSIS — I4891 Unspecified atrial fibrillation: Secondary | ICD-10-CM | POA: Diagnosis not present

## 2023-02-08 DIAGNOSIS — Z95818 Presence of other cardiac implants and grafts: Secondary | ICD-10-CM | POA: Diagnosis not present

## 2023-02-08 DIAGNOSIS — I6340 Cerebral infarction due to embolism of unspecified cerebral artery: Secondary | ICD-10-CM

## 2023-02-08 NOTE — Progress Notes (Signed)
Electrophysiology Office Note:    Date:  02/08/2023   ID:  Daniel [name], DOB Nov 26, 1950, MRN [id number]  PCP:  Christen Butter, NP   [facility] Providers Cardiologist:  Lance Muss, MD     Referring MD: [name], MD   History of Present Illness:    Daniel [name] is a 72 y.o. male with a medical history significant for history of stroke, loop recorder placement, reduced ejection fraction, coronary artery disease with PCI and coronary bypass grafting in 2021, referred for loop recorder removal.     Discussed the use of AI scribe software for clinical note transcription with the patient, who gave verbal consent to proceed.  History of Present Illness   The patient presents with discomfort from a loop recorder that has been in place since 2016. The loop recorder was implanted following a stroke, but no atrial fibrillation was detected during its monitoring period. A nuclear myocardial scan conducted in February 2023 indicated intermediate risk and ischemia.          Today, he reports that he is doing well. He does have some soreness at his loop recorder site.  EKGs/Labs/Other Studies Reviewed Today:     Echocardiogram:  TEE TEE July 24, 2020  EF 55 to 60%.  Grade 1 diastolic dysfunction.  Mildly dilated left atrium.   Monitors:   Stress testing:  June 12, 2021 Immediate risk nuclear myocardial scan.  No evidence of ischemia.  Advanced imaging:   Cardiac catherization   EKG:   EKG Interpretation Date/Time:  Tuesday February 08 2023 12:14:12 EDT Ventricular Rate:  65 PR Interval:  160 QRS Duration:  88 QT Interval:  408 QTC Calculation: 424 R Axis:   8  Text Interpretation: Sinus rhythm with frequent Premature ventricular complexes When compared with ECG of 27-Sep-2020 01:56, PVCs are now present Confirmed by York Pellant [phone]) on 02/08/2023 12:23:22 PM     Physical Exam:    VS:  BP (!) 140/68 (BP Location: Left Arm, Patient  Position: Sitting, Cuff Size: Large)   Pulse 65   Ht 5\' 11"  (1.803 m)   Wt 195 lb 12.8 oz (88.8 kg)   SpO2 95%   BMI 27.31 kg/m     Wt Readings from Last 3 Encounters:  02/08/23 195 lb 12.8 oz (88.8 kg)  02/02/23 198 lb 0.6 oz (89.8 kg)  12/24/22 191 lb 6.4 oz (86.8 kg)     GEN: Well nourished, well developed in no acute distress CARDIAC: noRRR, no murmurs, rubs, gallops RESPIRATORY:  Normal work of breathing MUSCULOSKELETAL: no edema    ASSESSMENT & PLAN:     History of stroke ILR in place -- reached ERI years ago No AF detected He would like it removed due to soreness  We discussed the logistics of the recorder removal including risk of infection bleeding.  He would like to proceed  PROCEDURE NOTE - ILR removal  The skin overlying the loop recorder was prepped and draped in usual sterile fashion.  The skin was infused with 8 cc of local anesthetic.  A 1.5 cm incision was made over the device and device easily withdrawn from the pocket.  There was no blood loss.  Steri-Strips and a sterile dressing were applied.       Signed, Maurice Small, MD  02/08/2023 1:21 PM    [facility]

## 2023-02-08 NOTE — Telephone Encounter (Signed)
Patient dropped off document FMLA, to be filled out by provider. Patient requested to send it back via Fax within 5-days. Document is located in providers box.Please advise at  [phone].

## 2023-02-08 NOTE — Patient Instructions (Addendum)
Medication Instructions:  Your physician recommends that you continue on your current medications as directed. Please refer to the Current Medication list given to you today.  Labwork: None ordered.  Testing/Procedures: None ordered.  Follow-Up: With Dr Lynnette Caffey as scheduled     Implantable Loop Recorder Removal, Care After This sheet gives you information about how to care for yourself after your procedure. Your health care provider may also give you more specific instructions. If you have problems or questions, contact your health care provider. What can I expect after the procedure? After the procedure, it is common to have: Soreness or discomfort near the incision. Some swelling or bruising near the incision.  Follow these instructions at home: Incision care  Monitor your cardiac device site for redness, swelling, and drainage. Call the device clinic at [phone] if you experience these symptoms or fever/chills.  Keep the large square bandage on your site for 24 hours and then you may remove it yourself. Keep the steri-strips underneath in place.   You may shower after 72 hours / 3 days from your procedure with the steri-strips in place. They will usually fall off on their own, or may be removed after 10 days. Pat dry.   Avoid lotions, ointments, or perfumes over your incision until it is well-healed.  Please do not submerge in water until your site is completely healed.   If your wound site starts to bleed apply pressure.       If you have any questions/concerns please call the device clinic at [phone].  Activity  Return to your normal activities.  Contact a health care provider if: You have redness, swelling, or pain around your incision. You have a fever.

## 2023-02-09 LAB — CBC WITH DIFFERENTIAL/PLATELET
Basophils Absolute: 0.1 10*3/uL (ref 0.0–0.2)
Basos: 1 %
EOS (ABSOLUTE): 0.1 10*3/uL (ref 0.0–0.4)
Eos: 2 %
Hematocrit: 46 % (ref 37.5–51.0)
Hemoglobin: 14.7 g/dL (ref 13.0–17.7)
Immature Grans (Abs): 0 10*3/uL (ref 0.0–0.1)
Immature Granulocytes: 0 %
Lymphocytes Absolute: 1.6 10*3/uL (ref 0.7–3.1)
Lymphs: 26 %
MCH: 29.5 pg (ref 26.6–33.0)
MCHC: 32 g/dL (ref 31.5–35.7)
MCV: 92 fL (ref 79–97)
Monocytes Absolute: 0.3 10*3/uL (ref 0.1–0.9)
Monocytes: 6 %
Neutrophils Absolute: 3.9 10*3/uL (ref 1.4–7.0)
Neutrophils: 65 %
Platelets: 179 10*3/uL (ref 150–450)
RBC: 4.98 x10E6/uL (ref 4.14–5.80)
RDW: 13.2 % (ref 11.6–15.4)
WBC: 6.1 10*3/uL (ref 3.4–10.8)

## 2023-02-09 LAB — CMP14+EGFR
ALT: 20 [IU]/L (ref 0–44)
AST: 21 [IU]/L (ref 0–40)
Albumin: 4.2 g/dL (ref 3.8–4.8)
Alkaline Phosphatase: 114 [IU]/L (ref 44–121)
BUN/Creatinine Ratio: 11 (ref 10–24)
BUN: 13 mg/dL (ref 8–27)
Bilirubin Total: 0.8 mg/dL (ref 0.0–1.2)
CO2: 27 mmol/L (ref 20–29)
Calcium: 9.4 mg/dL (ref 8.6–10.2)
Chloride: 102 mmol/L (ref 96–106)
Creatinine, Ser: 1.17 mg/dL (ref 0.76–1.27)
Globulin, Total: 2 g/dL (ref 1.5–4.5)
Glucose: 140 mg/dL — ABNORMAL HIGH (ref 70–99)
Potassium: 4.4 mmol/L (ref 3.5–5.2)
Sodium: 142 mmol/L (ref 134–144)
Total Protein: 6.2 g/dL (ref 6.0–8.5)
eGFR: 67 mL/min/{1.73_m2} (ref >59)

## 2023-02-09 LAB — LIPID PANEL
Chol/HDL Ratio: 3.6 {ratio} (ref 0.0–5.0)
Cholesterol, Total: 107 mg/dL (ref 100–199)
HDL: 30 mg/dL — ABNORMAL LOW (ref >39)
LDL Chol Calc (NIH): 55 mg/dL (ref 0–99)
Triglycerides: 124 mg/dL (ref 0–149)
VLDL Cholesterol Cal: 22 mg/dL (ref 5–40)

## 2023-02-09 NOTE — Telephone Encounter (Signed)
Faxed to [phone]  Copy made for scan

## 2023-04-04 ENCOUNTER — Encounter: Payer: Self-pay | Admitting: Medical-Surgical

## 2023-06-09 ENCOUNTER — Other Ambulatory Visit: Payer: Self-pay | Admitting: Interventional Cardiology

## 2023-06-09 DIAGNOSIS — E785 Hyperlipidemia, unspecified: Secondary | ICD-10-CM

## 2023-08-02 ENCOUNTER — Ambulatory Visit: Payer: No Typology Code available for payment source | Admitting: Medical-Surgical

## 2023-08-18 NOTE — Progress Notes (Signed)
 This encounter was created in error - please disregard.

## 2023-08-22 ENCOUNTER — Encounter: Payer: Self-pay | Admitting: Medical-Surgical

## 2023-09-14 ENCOUNTER — Encounter (HOSPITAL_COMMUNITY): Payer: Self-pay

## 2023-09-21 ENCOUNTER — Ambulatory Visit (INDEPENDENT_AMBULATORY_CARE_PROVIDER_SITE_OTHER): Admitting: Medical-Surgical

## 2023-09-21 ENCOUNTER — Encounter: Payer: Self-pay | Admitting: Medical-Surgical

## 2023-09-21 VITALS — BP 119/67 | HR 63 | Resp 20 | Ht 71.0 in | Wt 202.0 lb

## 2023-09-21 DIAGNOSIS — I10 Essential (primary) hypertension: Secondary | ICD-10-CM

## 2023-09-21 DIAGNOSIS — Z1211 Encounter for screening for malignant neoplasm of colon: Secondary | ICD-10-CM

## 2023-09-21 DIAGNOSIS — R5383 Other fatigue: Secondary | ICD-10-CM

## 2023-09-21 DIAGNOSIS — F418 Other specified anxiety disorders: Secondary | ICD-10-CM | POA: Diagnosis not present

## 2023-09-21 DIAGNOSIS — R2689 Other abnormalities of gait and mobility: Secondary | ICD-10-CM | POA: Diagnosis not present

## 2023-09-21 DIAGNOSIS — F4312 Post-traumatic stress disorder, chronic: Secondary | ICD-10-CM

## 2023-09-21 NOTE — Progress Notes (Signed)
        Established patient visit  History, exam, impression, and plan:  1. Fatigue, unspecified type (Primary) Pleasant 73 year old male presenting today with complaints of several months of feeling sluggish and fatigued.  Has a very active lifestyle and is always moving around to do different activities.  Every now and then, he has a feeling like his energy has just been sapped away.  He is unsure if this is normal for his age or if there may be other contributing factors.  Denies any issues with uncontrolled anxiety or depression.  No other mental status changes or associated symptoms.  Discussed that fatigue and sluggishness could be related to many different things.  Reviewed his medication list and he has been stable on these long-term.  Unlikely to be a medication reaction.  Plan to check labs as noted below to evaluate for metabolic cause of fatigue. - CBC - CMP14+EGFR - Iron, TIBC and Ferritin Panel - Vitamin B12 - TSH - Testosterone   2. Primary hypertension History of hypertension currently treated with Toprol -XL 25 mg and lisinopril  5 mg daily.  Tolerating both medications well without side effects.  Blood pressure is at goal at 119/67.  He is under the care of cardiology and plans to schedule a follow-up with them in the near future. Denies CP, SOB, palpitations, lower extremity edema, dizziness, headaches, or vision changes.  Cardiopulmonary exam is normal today.  Checking labs as below.  Continue lisinopril  and Toprol -XL as prescribed. - CBC - CMP14+EGFR  3. Balance problem Notes that he does have a bit of a balance problem where he feels that he is more off balance than usual.  He is certainly worried about the potential for falls and associated injury.  Not sure of the cause of the balance changes and has not been able to associate his intermittent episodes with any particular activity, time of day, etc.  Plan to evaluate labs as above.  If this is unrevealing, may consider  physical therapy referral.  4. Colon cancer screening Referring to GI for colonoscopy. - Ambulatory referral to Gastroenterology  5.  Anxious depression 6.  Chronic PTSD Currently treated with Cymbalta 60 mg daily, tolerating well without side effects.  Feels medication continues to work well for him and has been on this long-term.  Denies SI/HI.  Currently happy with the result of the medication and not interested in a regimen change.  Mood, affect, thought pattern, cognition, and speech all normal today.  Continue Cymbalta 60 mg daily as prescribed.  Procedures performed this visit: None.  Return in 6 months (on 03/23/2024) for chronic disease follow up.  __________________________________ Maryl Snook, DNP, APRN, FNP-BC Primary Care and Sports Medicine [facility]

## 2023-09-22 ENCOUNTER — Ambulatory Visit: Payer: Self-pay | Admitting: Medical-Surgical

## 2023-09-22 LAB — CMP14+EGFR
ALT: 23 IU/L (ref 0–44)
AST: 26 IU/L (ref 0–40)
Albumin: 4.4 g/dL (ref 3.8–4.8)
Alkaline Phosphatase: 127 IU/L — ABNORMAL HIGH (ref 44–121)
BUN/Creatinine Ratio: 9 — ABNORMAL LOW (ref 10–24)
BUN: 7 mg/dL — ABNORMAL LOW (ref 8–27)
Bilirubin Total: 0.6 mg/dL (ref 0.0–1.2)
CO2: 24 mmol/L (ref 20–29)
Calcium: 9.3 mg/dL (ref 8.6–10.2)
Chloride: 103 mmol/L (ref 96–106)
Creatinine, Ser: 0.8 mg/dL (ref 0.76–1.27)
Globulin, Total: 1.8 g/dL (ref 1.5–4.5)
Glucose: 79 mg/dL (ref 70–99)
Potassium: 4.7 mmol/L (ref 3.5–5.2)
Sodium: 140 mmol/L (ref 134–144)
Total Protein: 6.2 g/dL (ref 6.0–8.5)
eGFR: 94 mL/min/{1.73_m2} (ref >59)

## 2023-09-22 LAB — CBC
Hematocrit: 47.2 % (ref 37.5–51.0)
Hemoglobin: 15.8 g/dL (ref 13.0–17.7)
MCH: 30.1 pg (ref 26.6–33.0)
MCHC: 33.5 g/dL (ref 31.5–35.7)
MCV: 90 fL (ref 79–97)
Platelets: 178 10*3/uL (ref 150–450)
RBC: 5.25 x10E6/uL (ref 4.14–5.80)
RDW: 14 % (ref 11.6–15.4)
WBC: 5.9 10*3/uL (ref 3.4–10.8)

## 2023-09-22 LAB — IRON,TIBC AND FERRITIN PANEL
Ferritin: 46 ng/mL (ref 30–400)
Iron Saturation: 23 % (ref 15–55)
Iron: 79 ug/dL (ref 38–169)
Total Iron Binding Capacity: 338 ug/dL (ref 250–450)
UIBC: 259 ug/dL (ref 111–343)

## 2023-09-22 LAB — TSH: TSH: 0.829 u[IU]/mL (ref 0.450–4.500)

## 2023-09-22 LAB — TESTOSTERONE: Testosterone: 463 ng/dL (ref 264–916)

## 2023-09-22 LAB — VITAMIN B12: Vitamin B-12: 506 pg/mL (ref 232–1245)

## 2023-10-13 ENCOUNTER — Other Ambulatory Visit: Payer: Self-pay | Admitting: Medical-Surgical

## 2023-10-13 MED ORDER — METOPROLOL SUCCINATE ER 25 MG PO TB24: 25.0000 mg | ORAL_TABLET | Freq: Every day | ORAL | 1 refills | Status: DC

## 2023-10-13 MED ORDER — DULOXETINE HCL 60 MG PO CPEP: 60.0000 mg | ORAL_CAPSULE | Freq: Every day | ORAL | 1 refills | Status: DC

## 2023-10-13 NOTE — Telephone Encounter (Unsigned)
 Copied from CRM [id number]. Topic: Clinical - Medication Refill >> Oct 13, 2023 10:23 AM Kevelyn M wrote: Medication: DULoxetine (CYMBALTA) 60 MG capsule  metoprolol  succinate (TOPROL -XL) 25 MG 24 hr tablet   Has the patient contacted their pharmacy? Yes (Agent: If no, request that the patient contact the pharmacy for the refill. If patient does not wish to contact the pharmacy document the reason why and proceed with request.) (Agent: If yes, when and what did the pharmacy advise?)  This is the patient's preferred pharmacy:  [id number] - [place], [place] - [address] [place] [address] Phone: [phone] Fax: [phone]   Is this the correct pharmacy for this prescription? Yes If no, delete pharmacy and type the correct one.   Has the prescription been filled recently? No  Is the patient out of the medication? No  Has the patient been seen for an appointment in the last year OR does the patient have an upcoming appointment? Yes  Can we respond through MyChart? Yes  Agent: Please be advised that Rx refills may take up to 3 business days. We ask that you follow-up with your pharmacy.

## 2023-10-13 NOTE — Telephone Encounter (Signed)
 Cymbalta was prescribed by historical provider 03/2021 and Metoprolol  also last written 12/2022  Will fwd to pcp for review and signature.

## 2024-01-24 ENCOUNTER — Other Ambulatory Visit: Payer: Self-pay | Admitting: Nurse Practitioner

## 2024-01-24 ENCOUNTER — Other Ambulatory Visit: Payer: Self-pay | Admitting: Medical-Surgical

## 2024-01-24 DIAGNOSIS — E785 Hyperlipidemia, unspecified: Secondary | ICD-10-CM

## 2024-01-24 MED ORDER — ATORVASTATIN CALCIUM 80 MG PO TABS: 80.0000 mg | ORAL_TABLET | Freq: Every day | ORAL | 1 refills | Status: AC

## 2024-01-24 NOTE — Telephone Encounter (Unsigned)
 Copied from CRM [id number]. Topic: Clinical - Medication Refill >> Jan 24, 2024  3:27 PM Laurier C wrote: Medication: atorvastatin  (LIPITOR ) 80 MG tablet  Has the patient contacted their pharmacy? Yes   This is the patient's preferred pharmacy:  [id number] - [place] - [address] [place] [id number]  Is this the correct pharmacy for this prescription? Yes If no, delete pharmacy and type the correct one.   Has the prescription been filled recently? No  Is the patient out of the medication? Yes  Has the patient been seen for an appointment in the last year OR does the patient have an upcoming appointment? Yes  Can we respond through MyChart? Yes  Agent: Please be advised that Rx refills may take up to 3 business days. We ask that you follow-up with your pharmacy.

## 2024-01-24 NOTE — Telephone Encounter (Signed)
 Refill sent today by the Cardiology office.

## 2024-02-08 ENCOUNTER — Ambulatory Visit: Payer: Self-pay

## 2024-02-08 ENCOUNTER — Ambulatory Visit (INDEPENDENT_AMBULATORY_CARE_PROVIDER_SITE_OTHER)

## 2024-02-08 ENCOUNTER — Ambulatory Visit (INDEPENDENT_AMBULATORY_CARE_PROVIDER_SITE_OTHER): Admitting: Physician Assistant

## 2024-02-08 VITALS — BP 138/61 | HR 61 | Ht 71.0 in | Wt 203.0 lb

## 2024-02-08 DIAGNOSIS — Z96642 Presence of left artificial hip joint: Secondary | ICD-10-CM

## 2024-02-08 DIAGNOSIS — M25552 Pain in left hip: Secondary | ICD-10-CM

## 2024-02-08 MED ORDER — TRAMADOL HCL 50 MG PO TABS: 50.0000 mg | ORAL_TABLET | Freq: Four times a day (QID) | ORAL | 0 refills | Status: AC | PRN

## 2024-02-08 NOTE — Patient Instructions (Signed)
 Get xray.  Let me know if tramadol  helping with pain and need refill.  Keep ortho appt for November.

## 2024-02-08 NOTE — Telephone Encounter (Signed)
 Patient seen today with Vermell Bologna, PA
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
## 2024-02-10 ENCOUNTER — Ambulatory Visit: Payer: Self-pay | Admitting: Physician Assistant

## 2024-02-10 NOTE — Progress Notes (Signed)
 Ahnaf,   No acute fracture. Appears to be stable left hip.

## 2024-02-13 ENCOUNTER — Encounter: Payer: Self-pay | Admitting: Physician Assistant

## 2024-02-13 DIAGNOSIS — M25552 Pain in left hip: Secondary | ICD-10-CM | POA: Insufficient documentation

## 2024-02-13 DIAGNOSIS — Z96642 Presence of left artificial hip joint: Secondary | ICD-10-CM | POA: Insufficient documentation
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
## 2024-02-13 NOTE — Telephone Encounter (Unsigned)
 Copied from CRM #[id number]. Topic: Clinical - Lab/Test Results >> Feb 13, 2024  2:10 PM Antony RAMAN wrote: Reason for CRM: pts wife returning call about imaging results, callback [phone]

## 2024-02-13 NOTE — Telephone Encounter (Signed)
 Called patients' wife back no answer ,but was able to leave a detailed message about X-ray results. Also instructed patient and or wife to return call if there were any questions or concerns.SABRA
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
## 2024-03-22 ENCOUNTER — Ambulatory Visit

## 2024-03-22 ENCOUNTER — Ambulatory Visit: Attending: Physician Assistant | Admitting: Physician Assistant

## 2024-03-22 ENCOUNTER — Encounter: Payer: Self-pay | Admitting: Physician Assistant

## 2024-03-22 VITALS — BP 118/58 | HR 72 | Ht 71.0 in | Wt 200.0 lb

## 2024-03-22 DIAGNOSIS — I10 Essential (primary) hypertension: Secondary | ICD-10-CM

## 2024-03-22 DIAGNOSIS — I493 Ventricular premature depolarization: Secondary | ICD-10-CM | POA: Diagnosis not present

## 2024-03-22 DIAGNOSIS — Z951 Presence of aortocoronary bypass graft: Secondary | ICD-10-CM

## 2024-03-22 DIAGNOSIS — Z9861 Coronary angioplasty status: Secondary | ICD-10-CM

## 2024-03-22 DIAGNOSIS — E785 Hyperlipidemia, unspecified: Secondary | ICD-10-CM

## 2024-03-22 DIAGNOSIS — I2510 Atherosclerotic heart disease of native coronary artery without angina pectoris: Secondary | ICD-10-CM

## 2024-03-22 NOTE — Patient Instructions (Addendum)
 Medication Instructions:  START TAKING MAGNESIUM  TAURATE 200-400 MG DAILY.  Lab Work: TSH, MAGNESIUM , LPA, FASTING LIPID PANEL, AND A1C TO BE DONE TODAY.  Testing/Procedures: GEOFFRY HEWS- Long Term Monitor Instructions  Your physician has requested you wear a ZIO patch monitor for 14 days.  This is a single patch monitor. [employer] supplies one patch monitor per enrollment. Additional stickers are not available. Please do not apply patch if you will be having a Nuclear Stress Test,  Echocardiogram, Cardiac CT, MRI, or Chest Xray during the period you would be wearing the  monitor. The patch cannot be worn during these tests. You cannot remove and re-apply the  ZIO XT patch monitor.  Your ZIO patch monitor will be mailed 3 day USPS to your address on file. It may take 3-5 days  to receive your monitor after you have been enrolled.  Once you have received your monitor, please review the enclosed instructions. Your monitor  has already been registered assigning a specific monitor serial # to you.  Billing and Patient Assistance Program Information  We have supplied [employer] with any of your insurance information on file for billing purposes. [employer] offers a sliding scale Patient Assistance Program for patients that do not have  insurance, or whose insurance does not completely cover the cost of the ZIO monitor.  You must apply for the Patient Assistance Program to qualify for this discounted rate.  To apply, please call [employer] at [phone], select option 4, select option 2, ask to apply for  Patient Assistance Program. Meredeth will ask your household income, and how many people  are in your household. They will quote your out-of-pocket cost based on that information.  [employer] will also be able to set up a 84-month, interest-free payment plan if needed.  Applying the monitor   Shave hair from upper left chest.  Hold abrader disc by orange tab. Rub abrader in 40 strokes over the upper left  chest as  indicated in your monitor instructions.  Clean area with 4 enclosed alcohol  pads. Let dry.  Apply patch as indicated in monitor instructions. Patch will be placed under collarbone on left  side of chest with arrow pointing upward.  Rub patch adhesive wings for 2 minutes. Remove white label marked 1. Remove the white  label marked 2. Rub patch adhesive wings for 2 additional minutes.  While looking in a mirror, press and release button in center of patch. A small green light will  flash 3-4 times. This will be your only indicator that the monitor has been turned on.  Do not shower for the first 24 hours. You may shower after the first 24 hours.  Press the button if you feel a symptom. You will hear a small click. Record Date, Time and  Symptom in the Patient Logbook.  When you are ready to remove the patch, follow instructions on the last 2 pages of Patient  Logbook. Stick patch monitor onto the last page of Patient Logbook.  Place Patient Logbook in the blue and white box. Use locking tab on box and tape box closed  securely. The blue and white box has prepaid postage on it. Please place it in the mailbox as  soon as possible. Your physician should have your test results approximately 7 days after the  monitor has been mailed back to [facility].  Call [facility] Customer Care at [phone] if you have questions regarding  your ZIO XT patch monitor. Call them immediately if you see an  orange light blinking on your  monitor.  If your monitor falls off in less than 4 days, contact our Monitor department at [phone].  If your monitor becomes loose or falls off after 4 days call [employer] at [phone] for  suggestions on securing your monitor   Follow-Up: At [facility], you and your health needs are our priority.  As part of our continuing mission to provide you with exceptional heart care, our providers are all part of one team.  This team includes  your primary Cardiologist (physician) and Advanced Practice Providers or APPs (Physician Assistants and Nurse Practitioners) who all work together to provide you with the care you need, when you need it.  Your next appointment:   6 WEEKS  Provider:   DR. GEORGANNA ARCHER, MD   Other Instructions:

## 2024-03-22 NOTE — Progress Notes (Unsigned)
 ZIO XT serial # [id number] from office inventory applied to patient.  Dr. Georganna Archer to read.

## 2024-04-13 LAB — TSH: TSH: 0.732 u[IU]/mL (ref 0.450–4.500)

## 2024-04-13 LAB — LIPID PANEL
Chol/HDL Ratio: 3.1 ratio (ref 0.0–5.0)
Cholesterol, Total: 107 mg/dL (ref 100–199)
HDL: 35 mg/dL — ABNORMAL LOW (ref >39)
LDL Chol Calc (NIH): 57 mg/dL (ref 0–99)
Triglycerides: 71 mg/dL (ref 0–149)
VLDL Cholesterol Cal: 15 mg/dL (ref 5–40)

## 2024-04-13 LAB — HEMOGLOBIN A1C
Est. average glucose Bld gHb Est-mCnc: 111 mg/dL
Hgb A1c MFr Bld: 5.5 % (ref 4.8–5.6)

## 2024-04-13 LAB — MAGNESIUM: Magnesium: 1.8 mg/dL (ref 1.6–2.3)

## 2024-04-13 LAB — LIPOPROTEIN A (LPA): Lipoprotein (a): <8.4 nmol/L (ref <75.0)

## 2024-04-16 ENCOUNTER — Other Ambulatory Visit: Payer: Self-pay | Admitting: Medical-Surgical

## 2024-04-18 ENCOUNTER — Other Ambulatory Visit: Payer: Self-pay | Admitting: Medical-Surgical

## 2024-04-20 ENCOUNTER — Ambulatory Visit: Payer: Self-pay | Admitting: Physician Assistant

## 2024-04-25 DIAGNOSIS — I493 Ventricular premature depolarization: Secondary | ICD-10-CM | POA: Diagnosis not present

## 2024-05-01 ENCOUNTER — Other Ambulatory Visit: Payer: Self-pay

## 2024-05-01 DIAGNOSIS — I493 Ventricular premature depolarization: Secondary | ICD-10-CM

## 2024-05-04 NOTE — Telephone Encounter (Signed)
 Call x1; to schedule patient with Dr. Mealor for follow up on PVCs from heart monitor per Mercy Hails, PA - - attempted to call patients cell/home number - vm full; attempted to call patients wife cell - call not completed; lvmtcb with son christian.

## 2024-05-07 DIAGNOSIS — I493 Ventricular premature depolarization: Secondary | ICD-10-CM | POA: Insufficient documentation

## 2024-05-07 NOTE — Assessment & Plan Note (Signed)
 Refer back to EP
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
## 2024-05-07 NOTE — Assessment & Plan Note (Signed)
 Start Zetia

## 2024-05-08 ENCOUNTER — Ambulatory Visit
Attending: Student in an Organized Health Care Education/Training Program | Admitting: Student in an Organized Health Care Education/Training Program

## 2024-05-08 ENCOUNTER — Encounter: Payer: Self-pay | Admitting: Student in an Organized Health Care Education/Training Program

## 2024-05-08 VITALS — BP 110/70 | HR 68 | Ht 71.0 in | Wt 203.0 lb

## 2024-05-08 DIAGNOSIS — E782 Mixed hyperlipidemia: Secondary | ICD-10-CM | POA: Diagnosis not present

## 2024-05-08 DIAGNOSIS — R0609 Other forms of dyspnea: Secondary | ICD-10-CM | POA: Diagnosis not present

## 2024-05-08 DIAGNOSIS — I493 Ventricular premature depolarization: Secondary | ICD-10-CM | POA: Diagnosis not present

## 2024-05-08 DIAGNOSIS — I25810 Atherosclerosis of coronary artery bypass graft(s) without angina pectoris: Secondary | ICD-10-CM

## 2024-05-08 DIAGNOSIS — I10 Essential (primary) hypertension: Secondary | ICD-10-CM | POA: Diagnosis not present

## 2024-05-08 LAB — PRO B NATRIURETIC PEPTIDE: NT-Pro BNP: 423 pg/mL — ABNORMAL HIGH (ref 0–376)

## 2024-05-08 MED ORDER — METOPROLOL SUCCINATE ER 50 MG PO TB24: 50.0000 mg | ORAL_TABLET | Freq: Every day | ORAL | 3 refills | Status: AC

## 2024-05-08 MED ORDER — EZETIMIBE 10 MG PO TABS: 10.0000 mg | ORAL_TABLET | Freq: Every day | ORAL | 3 refills | Status: AC

## 2024-05-08 NOTE — Patient Instructions (Signed)
 Medication Instructions:  INCREASE Metoprolol  to 50 mg daily  START Zetia 10 mg daily   *If you need a refill on your cardiac medications before your next appointment, please call your pharmacy*  Lab Work: proBNP  If you have labs (blood work) drawn today and your tests are completely normal, you will receive your results only by: MyChart Message (if you have MyChart) OR A paper copy in the mail If you have any lab test that is abnormal or we need to change your treatment, we will call you to review the results.  Testing/Procedures: ECHOCARDIOGRAM  Your physician has requested that you have an echocardiogram. Echocardiography is a painless test that uses sound waves to create images of your heart. It provides your doctor with information about the size and shape of your heart and how well your hearts chambers and valves are working. This procedure takes approximately one hour. There are no restrictions for this procedure. Please do NOT wear cologne, perfume, aftershave, or lotions (deodorant is allowed). Please arrive 15 minutes prior to your appointment time.  Please note: We ask at that you not bring children with you during ultrasound (echo/ vascular) testing. Due to room size and safety concerns, children are not allowed in the ultrasound rooms during exams. Our front office staff cannot provide observation of children in our lobby area while testing is being conducted. An adult accompanying a patient to their appointment will only be allowed in the ultrasound room at the discretion of the ultrasound technician under special circumstances. We apologize for any inconvenience.   Follow-Up: At [facility], you and your health needs are our priority.  As part of our continuing mission to provide you with exceptional heart care, our providers are all part of one team.  This team includes your primary Cardiologist (physician) and Advanced Practice Providers or APPs (Physician  Assistants and Nurse Practitioners) who all work together to provide you with the care you need, when you need it.  Your next appointment:   6 month(s)  Provider:   Georganna Archer, MD

## 2024-05-08 NOTE — Telephone Encounter (Signed)
 Via Lyle RN while patient was at visit with Dr. Floretta - scheduled patient for 1/28 with Dr. Nancey.

## 2024-05-08 NOTE — Assessment & Plan Note (Signed)
-   No chest pain. Continue aspirin  81 mg indefinitely If DOE persists and his echocardiogram is normal can consider repeat stress testing

## 2024-05-08 NOTE — Assessment & Plan Note (Signed)
 At goal. No changes.

## 2024-05-09 ENCOUNTER — Ambulatory Visit: Payer: Self-pay | Admitting: Student in an Organized Health Care Education/Training Program

## 2024-05-21 ENCOUNTER — Ambulatory Visit: Attending: Cardiovascular Disease | Admitting: Cardiovascular Disease

## 2024-05-21 ENCOUNTER — Encounter: Payer: Self-pay | Admitting: Cardiovascular Disease

## 2024-05-21 VITALS — BP 120/80 | HR 71 | Ht 71.0 in | Wt 200.0 lb

## 2024-05-21 DIAGNOSIS — I493 Ventricular premature depolarization: Secondary | ICD-10-CM

## 2024-05-21 DIAGNOSIS — I4891 Unspecified atrial fibrillation: Secondary | ICD-10-CM | POA: Diagnosis not present

## 2024-05-21 MED ORDER — MAGNESIUM 300 MG PO TABS: 400.0000 mg | ORAL_TABLET | Freq: Every day | ORAL | Status: AC

## 2024-05-21 NOTE — Patient Instructions (Signed)
 Medication Instructions:  Your physician has recommended you make the following change in your medication:   ** Begin Magnesium  Taurate 400mg  daily  *If you need a refill on your cardiac medications before your next appointment, please call your pharmacy*  Lab Work: None ordered.  If you have labs (blood work) drawn today and your tests are completely normal, you will receive your results only by: MyChart Message (if you have MyChart) OR A paper copy in the mail If you have any lab test that is abnormal or we need to change your treatment, we will call you to review the results.  Testing/Procedures: None ordered.   Follow-Up: At [facility], you and your health needs are our priority.  As part of our continuing mission to provide you with exceptional heart care, our providers are all part of one team.  This team includes your primary Cardiologist (physician) and Advanced Practice Providers or APPs (Physician Assistants and Nurse Practitioners) who all work together to provide you with the care you need, when you need it.  Your next appointment:   As scheduled

## 2024-05-21 NOTE — Progress Notes (Signed)
" °  Electrophysiology Office Note:    Date:  05/21/2024   ID:  Daniel [name], DOB 28-Jul-1950, MRN [id number]  PCP:  [name], NP   [facility] Providers Cardiologist:  Georganna Archer, MD     Referring MD: [name], PA   History of Present Illness:    Daniel [name] is a 74 y.o. male with a medical history significant for history of stroke, loop recorder placement, reduced ejection fraction, coronary artery disease with PCI and coronary bypass grafting in 2021, referred for loop recorder removal.      History of Present Illness    Patient was instantly found to have PVCs and a heart monitor was placed.  He notes today that he does have occasional palpitations but they are not particularly bothersome to him.  He just is little bit anxious about the fact that they are occurring.  He is on metoprolol  succinate          Today, he reports that he is doing well. He does have some soreness at his loop recorder site.  EKGs/Labs/Other Studies Reviewed Today:     Echocardiogram:  TEE TEE July 24, 2020  EF 55 to 60%.  Grade 1 diastolic dysfunction.  Mildly dilated left atrium.   Monitors:  5-day ZIO monitor November 2025 Sinus rhythm heart rate 54 to 112 bpm, average 77 beats minute 16.1% PVCs No symptoms reported  Stress testing:  June 12, 2021 Immediate risk nuclear myocardial scan.  No evidence of ischemia.  Advanced imaging:   Cardiac catherization   EKG:   EKG Interpretation Date/Time:  Monday May 21 2024 16:11:19 EST Ventricular Rate:  71 PR Interval:  182 QRS Duration:  92 QT Interval:  430 QTC Calculation: 467 R Axis:   -11  Text Interpretation: Sinus rhythm with frequent Premature ventricular complexes Nonspecific T wave abnormality Prolonged QT When compared with ECG of 22-Mar-2024 08:27, Premature atrial complexes are no longer Present Confirmed by Nancey Scotts [phone]) on 05/21/2024 4:32:06 PM     Physical Exam:     VS:  BP 120/80 (BP Location: Right Arm, Patient Position: Sitting, Cuff Size: Large)   Pulse 71   Ht 5' 11 (1.803 m)   Wt 200 lb (90.7 kg)   SpO2 97%   BMI 27.89 kg/m     Wt Readings from Last 3 Encounters:  05/21/24 200 lb (90.7 kg)  05/08/24 203 lb (92.1 kg)  03/22/24 200 lb (90.7 kg)     GEN: Well nourished, well developed in no acute distress CARDIAC: noRRR, no murmurs, rubs, gallops RESPIRATORY:  Normal work of breathing MUSCULOSKELETAL: no edema    ASSESSMENT & PLAN:     History of stroke ILR explanted 2024 No AF detected  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

## 2024-06-06 ENCOUNTER — Ambulatory Visit: Admitting: Cardiovascular Disease

## 2024-06-12 ENCOUNTER — Ambulatory Visit (HOSPITAL_COMMUNITY)
Admission: RE | Admit: 2024-06-12 | Discharge: 2024-06-12 | Disposition: A | Source: Ambulatory Visit | Attending: Student in an Organized Health Care Education/Training Program | Admitting: Student in an Organized Health Care Education/Training Program

## 2024-06-12 DIAGNOSIS — I493 Ventricular premature depolarization: Secondary | ICD-10-CM

## 2024-06-12 DIAGNOSIS — I25810 Atherosclerosis of coronary artery bypass graft(s) without angina pectoris: Secondary | ICD-10-CM

## 2024-06-12 DIAGNOSIS — R0609 Other forms of dyspnea: Secondary | ICD-10-CM

## 2024-06-12 LAB — ECHOCARDIOGRAM COMPLETE
Area-P 1/2: 2.34 cm2
S' Lateral: 3 cm

## 2024-07-03 ENCOUNTER — Ambulatory Visit: Admitting: Cardiovascular Disease
